# Patient Record
Sex: Male | Born: 1940
Health system: Southern US, Community
[De-identification: ages and names within clinical notes are randomized; demographics above are authoritative.]

## PROBLEM LIST (undated history)

## (undated) DIAGNOSIS — I10 Essential (primary) hypertension: Secondary | ICD-10-CM

## (undated) DIAGNOSIS — R011 Cardiac murmur, unspecified: Secondary | ICD-10-CM

## (undated) DIAGNOSIS — I2510 Atherosclerotic heart disease of native coronary artery without angina pectoris: Secondary | ICD-10-CM

## (undated) DIAGNOSIS — R7989 Other specified abnormal findings of blood chemistry: Secondary | ICD-10-CM

## (undated) DIAGNOSIS — I358 Other nonrheumatic aortic valve disorders: Secondary | ICD-10-CM

## (undated) DIAGNOSIS — F329 Major depressive disorder, single episode, unspecified: Secondary | ICD-10-CM

## (undated) DIAGNOSIS — E119 Type 2 diabetes mellitus without complications: Secondary | ICD-10-CM

## (undated) DIAGNOSIS — E785 Hyperlipidemia, unspecified: Secondary | ICD-10-CM

## (undated) DIAGNOSIS — R778 Other specified abnormalities of plasma proteins: Secondary | ICD-10-CM

## (undated) DIAGNOSIS — R7303 Prediabetes: Secondary | ICD-10-CM

## (undated) DIAGNOSIS — K219 Gastro-esophageal reflux disease without esophagitis: Secondary | ICD-10-CM

## (undated) DIAGNOSIS — F32A Depression, unspecified: Secondary | ICD-10-CM

## (undated) HISTORY — DX: Hyperlipidemia, unspecified: E78.5

## (undated) HISTORY — DX: Other nonrheumatic aortic valve disorders: I35.8

## (undated) HISTORY — PX: SPINE SURGERY: SHX786

## (undated) HISTORY — DX: Atherosclerotic heart disease of native coronary artery without angina pectoris: I25.10

## (undated) HISTORY — DX: Type 2 diabetes mellitus without complications: E11.9

## (undated) HISTORY — DX: Other specified abnormal findings of blood chemistry: R79.89

## (undated) HISTORY — DX: Essential (primary) hypertension: I10

## (undated) HISTORY — PX: KNEE ARTHROSCOPY: SUR90

## (undated) HISTORY — DX: Other specified abnormalities of plasma proteins: R77.8

## (undated) HISTORY — PX: CERVICAL SPINE SURGERY: SHX589

---
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
## 2000-01-15 ENCOUNTER — Ambulatory Visit (HOSPITAL_BASED_OUTPATIENT_CLINIC_OR_DEPARTMENT_OTHER): Admission: RE | Admit: 2000-01-15 | Discharge: 2000-01-15 | Payer: Self-pay | Admitting: Orthopedic Surgery

## 2002-03-23 ENCOUNTER — Ambulatory Visit (HOSPITAL_COMMUNITY): Admission: RE | Admit: 2002-03-23 | Discharge: 2002-03-23 | Payer: Self-pay | Admitting: General Surgery

## 2002-06-13 ENCOUNTER — Encounter: Payer: Self-pay | Admitting: Family Medicine

## 2002-06-13 ENCOUNTER — Ambulatory Visit (HOSPITAL_COMMUNITY): Admission: RE | Admit: 2002-06-13 | Discharge: 2002-06-13 | Payer: Self-pay | Admitting: Family Medicine

## 2002-06-29 ENCOUNTER — Ambulatory Visit (HOSPITAL_COMMUNITY): Admission: RE | Admit: 2002-06-29 | Discharge: 2002-06-29 | Payer: Self-pay | Admitting: Family Medicine

## 2005-01-22 ENCOUNTER — Ambulatory Visit (HOSPITAL_COMMUNITY): Admission: RE | Admit: 2005-01-22 | Discharge: 2005-01-22 | Payer: Self-pay | Admitting: Cardiology

## 2005-11-30 ENCOUNTER — Emergency Department (HOSPITAL_COMMUNITY): Admission: EM | Admit: 2005-11-30 | Discharge: 2005-11-30 | Payer: Self-pay | Admitting: Emergency Medicine

## 2007-01-12 ENCOUNTER — Ambulatory Visit (HOSPITAL_COMMUNITY): Admission: RE | Admit: 2007-01-12 | Discharge: 2007-01-12 | Payer: Self-pay | Admitting: Family Medicine

## 2009-01-10 ENCOUNTER — Ambulatory Visit (HOSPITAL_COMMUNITY): Admission: RE | Admit: 2009-01-10 | Discharge: 2009-01-10 | Payer: Self-pay | Admitting: Family Medicine

## 2009-08-21 ENCOUNTER — Ambulatory Visit (HOSPITAL_COMMUNITY): Admission: RE | Admit: 2009-08-21 | Discharge: 2009-08-21 | Payer: Self-pay | Admitting: Family Medicine

## 2010-12-05 NOTE — H&P (Signed)
   NAME:  Douglas Keller, Douglas Keller NO.:  [id number]   MEDICAL RECORD NO.:  [id number]                  PATIENT TYPE:   LOCATION:                                       FACILITY:   PHYSICIAN:  Dalia Heading, M.D.               DATE OF BIRTH:  1941-06-16   DATE OF ADMISSION:  03/23/2002  DATE OF DISCHARGE:                                HISTORY & PHYSICAL   CHIEF COMPLAINT:  Hematochezia.   HISTORY OF PRESENT ILLNESS:  The patient is a 69 year old black male who is  referred for a colonoscopy.  He was noted to be hemoccult positive on a  recent rectal examination.  He states he has a history of slight bleeding  from hemorrhoidal disease.  He denies any lightheadedness, weight loss,  fever, abdominal pain, constipation, diarrhea, or melena.  He had a  colonoscopy four years ago by another physician which was reportedly  unremarkable.  There is no immediate family history of colon carcinoma.   PAST MEDICAL HISTORY:  Hypertension.   PAST SURGICAL HISTORY:  Knee surgery in 2001.   CURRENT MEDICATIONS:  Norvasc, Prinivil, hydrochlorothiazide, baby aspirin,  Vitamin E, glucosamine.   ALLERGIES:  No known drug allergies.   REVIEW OF SYSTEMS:  Unremarkable.   PHYSICAL EXAMINATION:  GENERAL:  The patient is a well-developed, well-  nourished black male in no acute distress.  VITAL SIGNS:  He is afebrile and vital signs are stable.  LUNGS:  Clear to auscultation with equal breath sounds bilaterally.  HEART:  Reveals a regular rate and rhythm without S3, S4, or murmurs.  ABDOMEN:  Benign.  RECTAL:  Deferred to the procedure.   IMPRESSION:  Hematochezia.    PLAN:  The patient is scheduled for a colonoscopy on March 23, 2002.  The  risks and benefits of the procedure including bleeding and perforation were  fully explained to the patient, gaining informed consent.                                                Dalia Heading, M.D.    MAJ/MEDQ  D:   03/16/2002  T:  03/16/2002  Job:  [id number]   cc:   Madelin Rear. Sherwood Gambler, M.D.  [address]  [place]  [place] [address]  Fax: [phone]

## 2010-12-05 NOTE — Op Note (Signed)
[place]  Patient:    STEEN, BISIG                       MRN: [id number] Proc. Date: 01/15/00 Adm. Date:  [id number] Attending:  Colbert Ewing                           Operative Report  PREOPERATIVE DIAGNOSIS:  Degenerative, medial meniscus tear, left knee.  POSTOPERATIVE DIAGNOSES:  Degenerative, medial and lateral menisci tear, left knee with associated mild to moderate diffuse, degenerative changes.  PROCEDURES:  Left knee exam under anesthesia arthroscopy with partial medial and lateral meniscectomy.  Chondroplasty, medial and lateral compartment.  SURGEON:  Loreta Ave, M.D.  ASSISTANT:  Arlys John D. Petrarca, P.A.-C.  ANESTHESIA:  General.  ESTIMATED BLOOD LOSS:  Minimal.  SPECIMENS:  None.  CULTURES:  None.  COMPLICATIONS:  None.  DRESSING:  Self-compressive.  DESCRIPTION OF PROCEDURE:  The patient was brought to the operating room and after adequate anesthesia had been obtained, the tourniquet and leg holder were applied after the knee was examined.  Full motion.  Stable knee without significant crepitus or instability.  Leg holder and knee immobilizer applied. Prepped and draped in the usual sterile fashion.  Three portals created, one superolateral, one each medial and lateral parapatellar.  Inflow catheter introduced.  The knee was distended.  Arthroscope introduced.  Knee inspected in a serial manner with following findings.  The patellofemoral joint had some mild degenerative changed, nothing marked, and there was good tracking. Slight unevenness of articular cartilage debrided with a shaver.  The medial compartment had extensive degenerative tearing, posterior half of medial meniscus.  This was able to be saucerized out with baskets and then tapered in smoothly with a shaver, removing the posterior half of the meniscus, but tapering it in well and saving the anterior half.  There were grade II and  III changes on the medial femoral condyle.  Although focal, these were spread out over different areas on the condyle, so a lot of the weightbearing dome was involved.  Uneven flaps of cartilage debrided to a stable surface.  The plateau looked good.  The cruciate ligaments were inspected and were intact. The lateral compartment had degenerative tearing of the lateral meniscus in the central third, and this was saucerized out with a shaver to a nice, smooth surface.  Grade II and mild grade III changes on the plateau, which were debrided evenly with a shaver.  The lateral femoral condyle did not have significant degenerative changes.  Once this was completed, the entire knee was once again examined.  Loose, chondral fragments throughout the knee were all debrided.  Some hypertrophic synovial tissue was also debrided from the front of the knee.  No other significant findings were appreciated. Instruments and fluid were removed.  The portals and knee were injected with Marcaine.  The portals were closed with 4-0 nylon.  Sterile, compressive dressing applied.  Anesthesia reversed and brought to the recovery room. Tolerated the surgery well with no complications. DD:  01/15/00 TD:  01/17/00 Job: [id number]

## 2011-08-14 ENCOUNTER — Encounter (HOSPITAL_COMMUNITY): Payer: Self-pay | Admitting: *Deleted

## 2011-08-14 ENCOUNTER — Other Ambulatory Visit: Payer: Self-pay

## 2011-08-14 ENCOUNTER — Emergency Department (HOSPITAL_COMMUNITY)
Admission: EM | Admit: 2011-08-14 | Discharge: 2011-08-15 | Disposition: A | Discharge status: Home / Self Care | Payer: Medicare Other | Attending: Emergency Medicine | Admitting: Emergency Medicine

## 2011-08-14 ENCOUNTER — Emergency Department (HOSPITAL_COMMUNITY): Payer: Medicare Other

## 2011-08-14 DIAGNOSIS — I10 Essential (primary) hypertension: Secondary | ICD-10-CM | POA: Insufficient documentation

## 2011-08-14 DIAGNOSIS — E876 Hypokalemia: Secondary | ICD-10-CM

## 2011-08-14 DIAGNOSIS — R109 Unspecified abdominal pain: Secondary | ICD-10-CM | POA: Insufficient documentation

## 2011-08-14 DIAGNOSIS — M79609 Pain in unspecified limb: Secondary | ICD-10-CM | POA: Insufficient documentation

## 2011-08-14 DIAGNOSIS — R51 Headache: Secondary | ICD-10-CM | POA: Insufficient documentation

## 2011-08-14 DIAGNOSIS — R079 Chest pain, unspecified: Secondary | ICD-10-CM | POA: Insufficient documentation

## 2011-08-14 DIAGNOSIS — I4949 Other premature depolarization: Secondary | ICD-10-CM | POA: Insufficient documentation

## 2011-08-14 LAB — COMPREHENSIVE METABOLIC PANEL
ALT: 20 U/L (ref 0–53)
AST: 35 U/L (ref 0–37)
Albumin: 3.8 g/dL (ref 3.5–5.2)
Alkaline Phosphatase: 59 U/L (ref 39–117)
BUN: 18 mg/dL (ref 6–23)
CO2: 27 mEq/L (ref 19–32)
Calcium: 9 mg/dL (ref 8.4–10.5)
Chloride: 103 mEq/L (ref 96–112)
Creatinine, Ser: 1.04 mg/dL (ref 0.50–1.35)
GFR calc Af Amer: 83 mL/min — ABNORMAL LOW (ref >90)
GFR calc non Af Amer: 71 mL/min — ABNORMAL LOW (ref >90)
Glucose, Bld: 119 mg/dL — ABNORMAL HIGH (ref 70–99)
Potassium: 3.3 mEq/L — ABNORMAL LOW (ref 3.5–5.1)
Sodium: 139 mEq/L (ref 135–145)
Total Bilirubin: 0.4 mg/dL (ref 0.3–1.2)
Total Protein: 7.5 g/dL (ref 6.0–8.3)

## 2011-08-14 LAB — CBC
HCT: 41.4 % (ref 39.0–52.0)
Hemoglobin: 14.2 g/dL (ref 13.0–17.0)
MCH: 30.7 pg (ref 26.0–34.0)
MCHC: 34.3 g/dL (ref 30.0–36.0)
MCV: 89.4 fL (ref 78.0–100.0)
Platelets: 251 10*3/uL (ref 150–400)
RBC: 4.63 MIL/uL (ref 4.22–5.81)
RDW: 12.6 % (ref 11.5–15.5)
WBC: 7.8 10*3/uL (ref 4.0–10.5)

## 2011-08-14 LAB — DIFFERENTIAL
Basophils Absolute: 0 10*3/uL (ref 0.0–0.1)
Basophils Relative: 0 % (ref 0–1)
Eosinophils Absolute: 0.2 10*3/uL (ref 0.0–0.7)
Eosinophils Relative: 3 % (ref 0–5)
Lymphocytes Relative: 31 % (ref 12–46)
Lymphs Abs: 2.4 10*3/uL (ref 0.7–4.0)
Monocytes Absolute: 0.6 10*3/uL (ref 0.1–1.0)
Monocytes Relative: 8 % (ref 3–12)
Neutro Abs: 4.6 10*3/uL (ref 1.7–7.7)
Neutrophils Relative %: 58 % (ref 43–77)

## 2011-08-14 LAB — POCT I-STAT TROPONIN I
Troponin i, poc: 0.02 ng/mL (ref 0.00–0.08)
Troponin i, poc: 0.02 ng/mL (ref 0.00–0.08)

## 2011-08-14 LAB — LIPASE, BLOOD: Lipase: 60 U/L — ABNORMAL HIGH (ref 11–59)

## 2011-08-14 MED ORDER — POTASSIUM CHLORIDE ER 10 MEQ PO TBCR: 10.0000 meq | EXTENDED_RELEASE_TABLET | Freq: Two times a day (BID) | ORAL | Status: DC

## 2011-08-14 MED ORDER — POTASSIUM CHLORIDE CRYS ER 20 MEQ PO TBCR
40.0000 meq | EXTENDED_RELEASE_TABLET | Freq: Once | ORAL | Status: AC
  Administered 2011-08-14: 40 meq via ORAL
  Filled 2011-08-14: qty 2

## 2011-08-14 MED ORDER — NITROGLYCERIN 0.4 MG SL SUBL
0.4000 mg | SUBLINGUAL_TABLET | SUBLINGUAL | Status: DC | PRN
  Administered 2011-08-15: 0.4 mg via SUBLINGUAL
  Filled 2011-08-14: qty 25

## 2011-08-14 MED ORDER — SODIUM CHLORIDE 0.9 % IV SOLN
INTRAVENOUS | Status: DC
  Administered 2011-08-14: 21:00:00 via INTRAVENOUS

## 2011-08-14 NOTE — ED Provider Notes (Cosign Needed)
History     CSN: [id number]  Arrival date & time 08/14/11  1955   First MD Initiated Contact with Patient 08/14/11 2004      Chief Complaint  Patient presents with  . Arm Pain  . Abdominal Pain    (Consider location/radiation/quality/duration/timing/severity/associated sxs/prior treatment) HPI  Patient relates about 7:15 he was sitting doing nothing, and he started getting pain in his left lateral lower chest/upper abdomen that radiated down his left arm. He states there was a steady pressure pain and then a shocking jabbing pain. He felt lightheaded but denies shortness of breath, nausea, vomiting, or diaphoresis. He states he's never had it before. He states the pain lasted about 30 minutes. He states his pain at its worst was a 6 and it currently is a 0.   Patient states his father started having heart disease when he was in his 24s. He relates he started seeing a cardiologist when he was 51 mainly for blood pressure control. He relates he's never been to be evaluated for MI, or cardiac stent.  PCP Dr. Phillips Odor [facility] and vascular Dr. Moreen Fowler   Past Medical History  Diagnosis Date  . Hypertension     Past Surgical History  Procedure Date  . Knee arthroscopy     History reviewed. No pertinent family history.  Father patient had a MI while in his 41s.  History  Substance Use Topics  . Smoking status: Never Smoker   . Smokeless tobacco: Not on file  . Alcohol Use: No, quit   retired  Review of Systems  All other systems reviewed and are negative.    Allergies  Review of patient's allergies indicates no known allergies.  Home Medications   Current Outpatient Rx  Name Route Sig Dispense Refill  . AMLODIPINE BESYLATE 10 MG PO TABS Oral Take 10 mg by mouth daily.    . ASPIRIN EC 81 MG PO TBEC Oral Take 81 mg by mouth at bedtime.    . OMEGA-3 FATTY ACIDS 1000 MG PO CAPS Oral Take 1 g by mouth 2 (two) times daily.    Marland Kitchen GLUCOSAMINE PO  Oral Take 1 tablet by mouth at bedtime.    Marland Kitchen HYDROCHLOROTHIAZIDE 25 MG PO TABS Oral Take 25 mg by mouth daily.    Marland Kitchen LISINOPRIL 40 MG PO TABS Oral Take 40 mg by mouth daily.    . ADULT MULTIVITAMIN W/MINERALS CH Oral Take 1 tablet by mouth daily.    Marland Kitchen ROSUVASTATIN CALCIUM 20 MG PO TABS Oral Take 20 mg by mouth at bedtime.      BP 134/79  Pulse 69  Temp(Src) 98.1 F (36.7 C) (Oral)  Resp 18  Ht 5\' 7"  (1.702 m)  Wt 190 lb (86.183 kg)  BMI 29.76 kg/m2  SpO2 98%  Vital signs normal   Physical Exam  Nursing note and vitals reviewed. Constitutional: He is oriented to person, place, and time. He appears well-developed and well-nourished.  Non-toxic appearance. He does not appear ill. No distress.  HENT:  Head: Normocephalic and atraumatic.  Right Ear: External ear normal.  Left Ear: External ear normal.  Nose: Nose normal. No mucosal edema or rhinorrhea.  Mouth/Throat: Oropharynx is clear and moist and mucous membranes are normal. No dental abscesses or uvula swelling.  Eyes: Conjunctivae and EOM are normal. Pupils are equal, round, and reactive to light.  Neck: Normal range of motion and full passive range of motion without pain. Neck supple.  Cardiovascular: Normal rate, regular  rhythm and normal heart sounds.  Exam reveals no gallop and no friction rub.   No murmur heard. Pulmonary/Chest: Effort normal and breath sounds normal. No respiratory distress. He has no wheezes. He has no rhonchi. He has no rales. He exhibits no tenderness and no crepitus.  Abdominal: Soft. Normal appearance and bowel sounds are normal. He exhibits no distension. There is no tenderness. There is no rebound and no guarding.    Musculoskeletal: Normal range of motion. He exhibits no edema and no tenderness.       Moves all extremities well.   Neurological: He is alert and oriented to person, place, and time. He has normal strength. No cranial nerve deficit.  Skin: Skin is warm, dry and intact. No rash noted.  No erythema. No pallor.  Psychiatric: He has a normal mood and affect. His speech is normal and behavior is normal. His mood appears not anxious.    ED Course  Procedures (including critical care time)  Pt remains pain free during his ED visit. His second troponin is negative. Pt wants to go home.    Results for orders placed during the hospital encounter of 08/14/11  CBC      Component Value Range   WBC 7.8  4.0 - 10.5 (K/uL)   RBC 4.63  4.22 - 5.81 (MIL/uL)   Hemoglobin 14.2  13.0 - 17.0 (g/dL)   HCT 45.4  09.8 - 11.9 (%)   MCV 89.4  78.0 - 100.0 (fL)   MCH 30.7  26.0 - 34.0 (pg)   MCHC 34.3  30.0 - 36.0 (g/dL)   RDW 14.7  82.9 - 56.2 (%)   Platelets 251  150 - 400 (K/uL)  DIFFERENTIAL      Component Value Range   Neutrophils Relative 58  43 - 77 (%)   Neutro Abs 4.6  1.7 - 7.7 (K/uL)   Lymphocytes Relative 31  12 - 46 (%)   Lymphs Abs 2.4  0.7 - 4.0 (K/uL)   Monocytes Relative 8  3 - 12 (%)   Monocytes Absolute 0.6  0.1 - 1.0 (K/uL)   Eosinophils Relative 3  0 - 5 (%)   Eosinophils Absolute 0.2  0.0 - 0.7 (K/uL)   Basophils Relative 0  0 - 1 (%)   Basophils Absolute 0.0  0.0 - 0.1 (K/uL)  COMPREHENSIVE METABOLIC PANEL      Component Value Range   Sodium 139  135 - 145 (mEq/L)   Potassium 3.3 (*) 3.5 - 5.1 (mEq/L)   Chloride 103  96 - 112 (mEq/L)   CO2 27  19 - 32 (mEq/L)   Glucose, Bld 119 (*) 70 - 99 (mg/dL)   BUN 18  6 - 23 (mg/dL)   Creatinine, Ser 1.30  0.50 - 1.35 (mg/dL)   Calcium 9.0  8.4 - 86.5 (mg/dL)   Total Protein 7.5  6.0 - 8.3 (g/dL)   Albumin 3.8  3.5 - 5.2 (g/dL)   AST 35  0 - 37 (U/L)   ALT 20  0 - 53 (U/L)   Alkaline Phosphatase 59  39 - 117 (U/L)   Total Bilirubin 0.4  0.3 - 1.2 (mg/dL)   GFR calc non Af Amer 71 (*) >90 (mL/min)   GFR calc Af Amer 83 (*) >90 (mL/min)  LIPASE, BLOOD      Component Value Range   Lipase 60 (*) 11 - 59 (U/L)  POCT I-STAT TROPONIN I      Component Value Range  Troponin i, poc 0.02  0.00 - 0.08 (ng/mL)    Comment 3           POCT I-STAT TROPONIN I      Component Value Range   Troponin i, poc 0.02  0.00 - 0.08 (ng/mL)   Comment 3            Laboratory interpretation all normal except hypokalemia and minimally elevated lipase of uncertain significance   Dg Chest Port 1 View  08/14/2011  *RADIOLOGY REPORT*  Clinical Data: Chest and abdominal pain.  PORTABLE CHEST - 1 VIEW  Comparison: 01/22/2005  Findings: Heart size is stable and within normal limits allowing for portable technique.  Both lungs are clear.  No evidence of pleural effusion.  Normal hilar and mediastinal contours peri  IMPRESSION: Stable exam.  No active disease.  Original Report Authenticated By: Danae Orleans, M.D.    Date: 08/14/2011  Rate: 81  Rhythm: normal sinus rhythm  QRS Axis: normal  Intervals: normal  ST/T Wave abnormalities: normal  Conduction Disutrbances:none  Narrative Interpretation: PVC's  Old EKG Reviewed: none available   Diagnoses that have been ruled out:  None  Diagnoses that are still under consideration:  None  Final diagnoses:  Chest pain  Hypokalemia   New Prescriptions   POTASSIUM CHLORIDE (K-DUR) 10 MEQ TABLET    Take 1 tablet (10 mEq total) by mouth 2 (two) times daily.   Plan discharge  Devoria Albe, MD, FACEP      MDM          Ward Givens, MD 08/14/11 [phone]

## 2011-08-14 NOTE — ED Notes (Signed)
Patient A&O; skin w/d. Respirations even and unlabored; able to speak in complete sentences without difficulty.  IV established; NS at 19ml/hr infusing without difficulty.  Abdomen soft and nontender.  Patient moves all extremities well.  Equal grips.

## 2011-08-14 NOTE — ED Notes (Signed)
Left arm pain and upper abd pain x 45 minutes with dizziness, denies SOB, N/V or dizziness at this time, denies diaphoresis

## 2011-08-15 NOTE — ED Notes (Signed)
Patient discharged home in good condition.  Prescription given for Potassium; effects and use explained.  Patient verbalized understanding to take medications as directed and to f/u with his MD on Monday.  Patient ambulatory with steady gait.

## 2012-07-19 NOTE — H&P (Signed)
  NTS SOAP Note  Vital Signs:  Vitals as of: 07/19/2012: Systolic 154: Diastolic 94: Heart Rate 69: Temp 97.19F: Height 28ft 7in: Weight 207Lbs 0 Ounces: BMI 32  BMI : 32.42 kg/m2  Subjective: This 26 Years 76 Months old Male presents for a screening TCS.  Denies any gi complaints.  Has not had a TCS in the last ten years.  No family h/o colon cancer.   Review of Symptoms:  Constitutional:unremarkable   Head:unremarkable    Eyes:unremarkable   Nose/Mouth/Throat:unremarkable Cardiovascular:  unremarkable   Respiratory:unremarkable   Gastrointestinal:  unremarkable   Genitourinary:unremarkable       neck, joint, back pain Skin:unremarkable Hematolgic/Lymphatic:unremarkable     Allergic/Immunologic:unremarkable     Past Medical History:    Reviewed   Past Medical History  Surgical History: knee surgery Medical Problems:  High Blood pressure, High cholesterol Allergies: nkda Medications: NCTZ, prinivil, amlodipine, baby asa, glucosamine, crestor   Social History:Reviewed  Social History  Preferred Language: English Ethnicity: Not Hispanic / Latino Age: 71 Years 6 Months Marital Status:  M Alcohol:  No Recreational drug(s):  No   Smoking Status: Never smoker reviewed on 07/19/2012 Functional Status reviewed on mm/dd/yyyy ------------------------------------------------ Bathing: Normal Cooking: Normal Dressing: Normal Driving: Normal Eating: Normal Managing Meds: Normal Oral Care: Normal Shopping: Normal Toileting: Normal Transferring: Normal Walking: Normal Cognitive Status reviewed on mm/dd/yyyy ------------------------------------------------ Attention: Normal Decision Making: Normal Language: Normal Memory: Normal Motor: Normal Perception: Normal Problem Solving: Normal Visual and Spatial: Normal   Family History:  Reviewed   Family History      Not Available    Objective Information: General:   Well appearing, well nourished in no distress. Neck:  Supple without lymphadenopathy.  Heart:  RRR, no murmur Lungs:    CTA bilaterally, no wheezes, rhonchi, rales.  Breathing unlabored. Abdomen:Soft, NT/ND, no HSM, no masses.   deferred to procedure  Assessment:Need for screening TCS  Diagnosis &amp; Procedure:    Plan:Scheduled for TCS on 07/26/12.   Patient Education:Alternative treatments to surgery were discussed with patient (and family).  Risks and benefits  of procedure were fully explained to the patient (and family) who gave informed consent. Patient/family questions were addressed.  Follow-up:Pending Surgery                           Active Diagnosis and Procedures: V76.51 Screening for malignant neoplasms of colon   99202 - OFFICE OUTPATIENT NEW 20 MINUTES

## 2012-07-21 ENCOUNTER — Encounter (HOSPITAL_COMMUNITY): Payer: Self-pay | Admitting: Pharmacy Technician

## 2012-07-26 ENCOUNTER — Ambulatory Visit (HOSPITAL_COMMUNITY)
Admission: RE | Admit: 2012-07-26 | Discharge: 2012-07-26 | Disposition: A | Discharge status: Home / Self Care | Payer: Medicare Other | Source: Ambulatory Visit | Attending: General Surgery | Admitting: General Surgery

## 2012-07-26 ENCOUNTER — Encounter (HOSPITAL_COMMUNITY): Payer: Self-pay | Admitting: *Deleted

## 2012-07-26 ENCOUNTER — Encounter (HOSPITAL_COMMUNITY)
Admission: RE | Disposition: A | Discharge status: Home / Self Care | Payer: Self-pay | Source: Ambulatory Visit | Attending: General Surgery

## 2012-07-26 DIAGNOSIS — I10 Essential (primary) hypertension: Secondary | ICD-10-CM | POA: Insufficient documentation

## 2012-07-26 DIAGNOSIS — K5289 Other specified noninfective gastroenteritis and colitis: Secondary | ICD-10-CM | POA: Insufficient documentation

## 2012-07-26 DIAGNOSIS — Z1211 Encounter for screening for malignant neoplasm of colon: Secondary | ICD-10-CM | POA: Insufficient documentation

## 2012-07-26 HISTORY — PX: COLONOSCOPY: SHX5424

## 2012-07-26 LAB — CLOSTRIDIUM DIFFICILE BY PCR: Toxigenic C. Difficile by PCR: NEGATIVE

## 2012-07-26 SURGERY — COLONOSCOPY
Anesthesia: Moderate Sedation

## 2012-07-26 MED ORDER — MIDAZOLAM HCL 5 MG/5ML IJ SOLN
INTRAMUSCULAR | Status: DC | PRN
  Administered 2012-07-26: 4 mg via INTRAVENOUS

## 2012-07-26 MED ORDER — MEPERIDINE HCL 50 MG/ML IJ SOLN
INTRAMUSCULAR | Status: AC
  Filled 2012-07-26: qty 2

## 2012-07-26 MED ORDER — MIDAZOLAM HCL 5 MG/5ML IJ SOLN
INTRAMUSCULAR | Status: AC
  Filled 2012-07-26: qty 10

## 2012-07-26 MED ORDER — MEPERIDINE HCL 25 MG/ML IJ SOLN
INTRAMUSCULAR | Status: DC | PRN
  Administered 2012-07-26: 50 mg via INTRAVENOUS

## 2012-07-26 MED ORDER — STERILE WATER FOR IRRIGATION IR SOLN
Status: DC | PRN
  Administered 2012-07-26: 07:00:00

## 2012-07-26 MED ORDER — SODIUM CHLORIDE 0.45 % IV SOLN
INTRAVENOUS | Status: DC
  Administered 2012-07-26: 07:00:00 via INTRAVENOUS

## 2012-07-26 NOTE — Op Note (Signed)
[facility] [address] [place], [address]   COLONOSCOPY PROCEDURE REPORT  PATIENT: Douglas Keller, Douglas Keller  MR#: [id number] BIRTHDATE: October 31, 1940 , 71  yrs. old GENDER: Male ENDOSCOPIST: Franky Macho, MD REFERRED EX:[id number], John PROCEDURE DATE:  07/26/2012 PROCEDURE:   Colonoscopy, screening ASA CLASS:   Class II INDICATIONS:Average risk patient for colorectal cancer. MEDICATIONS: Versed 4 mg IV and Demerol 50 mg IV  DESCRIPTION OF PROCEDURE:   After the risks benefits and alternatives of the procedure were thoroughly explained, informed consent was obtained.  A digital rectal exam revealed no abnormalities of the rectum.   The Pentax Colonoscope [id number] endoscope was introduced through the anus and advanced to the cecum, which was identified by both the appendix and ileocecal valve. No adverse events experienced.   The quality of the prep was adequate, using MoviPrep  The instrument was then slowly withdrawn as the colon was fully examined.      COLON FINDINGS: Abnormal mucosa was found in the proximal ascending colon.  The mucosa was friable.  Retroflexed views revealed no abnormalities. The time to cecum=6 minutes 0 seconds.  Withdrawal time=6 minutes 0 seconds.  The scope was withdrawn and the procedure completed. COMPLICATIONS: There were no complications.  ENDOSCOPIC IMPRESSION: Abnormal mucosa was found in the proximal ascending colon This is nonspecific and may be related to prep.  Cultures and biopsy pending RECOMMENDATIONS: 1.  Await biopsy results 2.  Repeat Colonscopy in 10 years.   eSigned:  Franky Macho, MD 07/26/2012 7:58 AM   cc:

## 2012-07-26 NOTE — Interval H&P Note (Signed)
History and Physical Interval Note:  07/26/2012 7:33 AM  Douglas Keller  has presented today for surgery, with the diagnosis of screening for malignancy  The various methods of treatment have been discussed with the patient and family. After consideration of risks, benefits and other options for treatment, the patient has consented to  Procedure(s) (LRB) with comments: COLONOSCOPY (N/A) as a surgical intervention .  The patient's history has been reviewed, patient examined, no change in status, stable for surgery.  I have reviewed the patient's chart and labs.  Questions were answered to the patient's satisfaction.     [name]

## 2012-07-27 LAB — OVA AND PARASITE EXAMINATION: Ova and parasites: NONE SEEN

## 2012-07-27 LAB — FECAL LACTOFERRIN, QUANT: Fecal Lactoferrin: POSITIVE

## 2012-07-27 LAB — GIARDIA/CRYPTOSPORIDIUM SCREEN(EIA)
Cryptosporidium Screen (EIA): NEGATIVE
Giardia Screen - EIA: NEGATIVE

## 2012-07-28 ENCOUNTER — Encounter (HOSPITAL_COMMUNITY): Payer: Self-pay | Admitting: General Surgery

## 2012-07-30 LAB — STOOL CULTURE

## 2012-11-30 ENCOUNTER — Other Ambulatory Visit: Payer: Self-pay | Admitting: Cardiovascular Disease

## 2012-12-01 NOTE — Telephone Encounter (Signed)
#   30 given on refills.  Needs appt.

## 2012-12-30 ENCOUNTER — Other Ambulatory Visit: Payer: Self-pay | Admitting: *Deleted

## 2012-12-30 MED ORDER — LISINOPRIL 40 MG PO TABS: 40.0000 mg | ORAL_TABLET | Freq: Every day | ORAL | Status: DC

## 2012-12-30 MED ORDER — POTASSIUM CHLORIDE CRYS ER 10 MEQ PO TBCR: 10.0000 meq | EXTENDED_RELEASE_TABLET | Freq: Every day | ORAL | Status: DC

## 2012-12-30 MED ORDER — AMLODIPINE BESYLATE 10 MG PO TABS: 10.0000 mg | ORAL_TABLET | Freq: Every day | ORAL | Status: DC

## 2012-12-30 MED ORDER — HYDROCHLOROTHIAZIDE 25 MG PO TABS: 25.0000 mg | ORAL_TABLET | Freq: Every day | ORAL | Status: DC

## 2013-01-04 ENCOUNTER — Other Ambulatory Visit: Payer: Self-pay | Admitting: *Deleted

## 2013-02-13 ENCOUNTER — Ambulatory Visit (HOSPITAL_COMMUNITY)
Admission: RE | Admit: 2013-02-13 | Discharge: 2013-02-13 | Disposition: A | Discharge status: Home / Self Care | Payer: Medicare Other | Source: Ambulatory Visit | Attending: Anesthesiology | Admitting: Anesthesiology

## 2013-02-13 DIAGNOSIS — M5030 Other cervical disc degeneration, unspecified cervical region: Secondary | ICD-10-CM | POA: Insufficient documentation

## 2013-02-13 DIAGNOSIS — M542 Cervicalgia: Secondary | ICD-10-CM | POA: Insufficient documentation

## 2013-02-13 DIAGNOSIS — M47812 Spondylosis without myelopathy or radiculopathy, cervical region: Secondary | ICD-10-CM | POA: Insufficient documentation

## 2013-02-13 DIAGNOSIS — I10 Essential (primary) hypertension: Secondary | ICD-10-CM | POA: Insufficient documentation

## 2013-02-13 DIAGNOSIS — IMO0001 Reserved for inherently not codable concepts without codable children: Secondary | ICD-10-CM | POA: Insufficient documentation

## 2013-02-13 DIAGNOSIS — M4802 Spinal stenosis, cervical region: Secondary | ICD-10-CM | POA: Insufficient documentation

## 2013-02-13 HISTORY — DX: Other cervical disc degeneration, unspecified cervical region: M50.30

## 2013-02-13 HISTORY — DX: Spondylosis without myelopathy or radiculopathy, cervical region: M47.812

## 2013-02-13 NOTE — Evaluation (Signed)
Physical Therapy Evaluation  Patient Details  Name: Douglas Keller MRN: [id number] Date of Birth: 23-Dec-1940  Today's Date: 02/13/2013 Time: 4098-1191 PT Time Calculation (min): 40 min Charges:  Evaluation: 1  TE: [id number]             Visit#: 1 of 12  Re-eval: 03/15/13 Assessment Diagnosis: Neck Pain Next MD Visit: Dr. Laurian Brim - 03/07/13  Authorization: [employer]    Authorization Time Period:    Authorization Visit#: 1 of 10   Past Medical History:  Past Medical History  Diagnosis Date  . Hypertension   . Hypercholesteremia    Past Surgical History:  Past Surgical History  Procedure Laterality Date  . Knee arthroscopy    . Colonoscopy  07/26/2012    Procedure: COLONOSCOPY;  Surgeon: Dalia Heading, MD;  Location: [facility];  Service: Gastroenterology;  Laterality: N/A;    Subjective Symptoms/Limitations Pertinent History: Pt is referred to PT for neck pain which started back in 2010.  He reports that over the past two months it has become progressivly worse.  He reports that he has a hx of cervical stenosis which is compressing on his spinal cord.  He will have an MRI tomorrow to find out if it has progressed into his hand. He reports he has numbness to his entire RUE and is worse pain in the neck and shoudler.  He states he has a difficult time sleeping and is reduced to only 2-3 hour and is sleeping in his recliner.  He has pain when laying down flat and turning to his right side (for the past 2  weeks). Difficulty turning his head while driving., pain with golf.  He reports that prior to two months ago pain was a 3/10.  Limitations: House hold activities;Other (comment);Standing (laying down) How long can you stand comfortably?: 10 minutes.  Patient Stated Goals: I want to be able to play golf with less pain.  Pain Assessment Currently in Pain?: Yes Pain Score: 3  (Pain Range: 3-10/10) Pain Location: Neck Pain Orientation: Right Pain Radiating Towards: Rt  UE Pain Onset: More than a month ago Pain Frequency: Intermittent Pain Relieving Factors: Alleve Effect of Pain on Daily Activities: difficulty golfing, sleeping in a bed.   Balance Screening Balance Screen Has the patient fallen in the past 6 months: No Has the patient had a decrease in activity level because of a fear of falling? : Yes Is the patient reluctant to leave their home because of a fear of falling? : No  Prior Functioning  Prior Function Vocation: Retired Leisure: Hobbies-yes (Comment) Comments: Enjoys playing golf.   Sensation/Coordination/Flexibility/Functional Tests Coordination Gross Motor Movements are Fluid and Coordinated: No Coordination and Movement Description: impaired to scapular and posterior cervical musculature  Assessment RUE AROM (degrees) Right Shoulder Flexion: 160 Degrees (stiffness at end range) Right Shoulder ABduction: 150 Degrees (stifness at end range) Right Shoulder External Rotation: 80 Degrees (stiffness at end range) RUE Strength RUE Overall Strength Comments: 5/5 throughout UE Cervical AROM Cervical Flexion: WNL - pain Cervical Extension: 11.5 cm - pain Cervical - Right Side Bend: 19.5 cm Cervical - Left Side Bend: 18 cm (most painful) Cervical - Right Rotation: 17.5 cm  (pain) Cervical - Left Rotation: 18.5 cm Cervical Strength Overall Cervical Strength Comments: capital flexion 3/5 Cervical Flexion: 3/5 Cervical Extension: 4/5 Cervical - Right Side Bend: 5/5 Cervical - Left Side Bend: 5/5 Cervical - Right Rotation: 5/5 Cervical - Left Rotation: 5/5 Palpation Palpation: decreased anterior rib mobility Rib  1-5 Rt and Left, pain and tendenress to anterior ribs, bil upper trapezius and middle scalene, decreaed C7 PA mobilibity, decreased pain with distraction to neck  Mobility/Balance  Posture/Postural Control Posture/Postural Control: Postural limitations Postural Limitations: upper cross syndrome     Exercise/Treatments Seated Exercises Neck Retraction: 10 reps Cervical Rotation: Both;5 reps Lateral Flexion: Both;5 reps Postural Training: education and demonstration of proper technique Other Seated Exercise: scapula retraction 5 reps Supine Exercises Neck Retraction: 5 reps Other Supine Exercise: Capital flexion 3x3 sec holds    Physical Therapy Assessment and Plan PT Assessment and Plan Clinical Impression Statement: Pt is a 72 year old male referred to PT for neck pain with impairments listed below.  At this time pt is most limited by his significantly impaired posture causing adaptive shortening to cervical and shoulder musclature with decreased strength and with decreased mobility to anterior ribs, clavicle and lower cervical vertabrae likely causing increased pain.  Pt will benefit from skilled therapeutic intervention in order to improve on the following deficits: Pain;Decreased strength;Impaired perceived functional ability;Decreased range of motion;Increased muscle spasms;Increased fascial restricitons Rehab Potential: Good PT Frequency: Min 3X/week PT Duration: 4 weeks PT Treatment/Interventions: Therapeutic activities;Therapeutic exercise;Neuromuscular re-education;Patient/family education;Manual techniques;Modalities PT Plan: Cervical exercises to improve posture (UBE, X-V, W-backs) start in supine for exercises to improve postural strength.  Manual techniques and cervical traction for lower cervical spine.  manual with joint mobs to anterior Ribs 1-5 and UT regions.     Goals Home Exercise Program Pt/caregiver will Perform Home Exercise Program: Independently PT Goal: Perform Home Exercise Program - Progress: Goal set today PT Short Term Goals Time to Complete Short Term Goals: 2 weeks PT Short Term Goal 1: Pt will report pain less than 3/10  PT Short Term Goal 2: Pt will improve cervical and rt shoulder motion to WNL in order to comfortably turn neck which driving  his car.  PT Short Term Goal 3: Pt will be edcuated and verbalize approprirate posture and importance of proper posture to decrease risk of secondary impairments.  PT Long Term Goals Time to Complete Long Term Goals: 4 weeks PT Long Term Goal 1: Pt will improve NDI to less than 20% for improved QOL.  PT Long Term Goal 2: Pt will present with minimal forward head posture to decreae chance of secondary impairments. Long Term Goal 3: Pt will improve his cervical and shoulder coordination in order to swing a golf club with pain less than 3/10 to continue with golf activities.   Problem List Patient Active Problem List   Diagnosis Date Noted  . Cervical spondylosis without myelopathy 02/13/2013  . Degeneration of cervical intervertebral disc 02/13/2013  . Spinal stenosis in cervical region 02/13/2013    PT - End of Session Activity Tolerance: Patient tolerated treatment well General Behavior During Therapy: [facility] for tasks assessed/performed PT Plan of Care PT Home Exercise Plan: given PT Patient Instructions: importance of posture and exercise, answered questions Consulted and Agree with Plan of Care: Patient  GP Functional Assessment Tool Used: clinical observation (pt did not fill out NDI at eval, will bring back) Functional Limitation: Changing and maintaining body position Changing and Maintaining Body Position Current Status (Z6109): At least 40 percent but less than 60 percent impaired, limited or restricted Changing and Maintaining Body Position Goal Status (U0454): At least 1 percent but less than 20 percent impaired, limited or restricted  Johnpaul Gillentine, MPT, ATC 02/13/2013, 4:33 PM  Physician Documentation Your signature is required to indicate approval of  the treatment plan as stated above.  Please sign and either send electronically or make a copy of this report for your files and return this physician signed original.   Please mark one 1.__approve of plan  2. ___approve of  plan with the following conditions.   ______________________________                                                          _____________________ Physician Signature                                                                                                             Date

## 2013-02-20 ENCOUNTER — Ambulatory Visit (HOSPITAL_COMMUNITY)
Admission: RE | Admit: 2013-02-20 | Discharge: 2013-02-20 | Disposition: A | Discharge status: Home / Self Care | Payer: Medicare Other | Source: Ambulatory Visit | Attending: Anesthesiology | Admitting: Anesthesiology

## 2013-02-20 DIAGNOSIS — M542 Cervicalgia: Secondary | ICD-10-CM | POA: Insufficient documentation

## 2013-02-20 DIAGNOSIS — M47812 Spondylosis without myelopathy or radiculopathy, cervical region: Secondary | ICD-10-CM

## 2013-02-20 DIAGNOSIS — IMO0001 Reserved for inherently not codable concepts without codable children: Secondary | ICD-10-CM | POA: Insufficient documentation

## 2013-02-20 DIAGNOSIS — I10 Essential (primary) hypertension: Secondary | ICD-10-CM | POA: Insufficient documentation

## 2013-02-20 DIAGNOSIS — M4802 Spinal stenosis, cervical region: Secondary | ICD-10-CM

## 2013-02-20 DIAGNOSIS — M5030 Other cervical disc degeneration, unspecified cervical region: Secondary | ICD-10-CM

## 2013-02-20 NOTE — Progress Notes (Signed)
Physical Therapy Treatment Patient Details  Name: Douglas Keller MRN: [id number] Date of Birth: 11-Feb-1941  Today's Date: 02/20/2013 Time: 4098-1191 PT Time Calculation (min): 38 min Charges: Manual: [id number] TE: [id number] Visit#: 2 of 12  Re-eval: 03/15/13    Authorization: [employer]  Authorization Time Period:    Authorization Visit#: 2 of 10   Subjective: Symptoms/Limitations Symptoms: "I think the therapy is helping a lot.  Pain Assessment Pain Score: 4  Pain Location: Neck  Precautions/Restrictions     Exercise/Treatments Stretches Upper Trapezius Stretch: 3 reps;30 seconds Levator Stretch: 3 reps;30 seconds Seated Exercises Neck Retraction: 10 reps X to V: 5 reps Shoulder Rolls: Backwards;20 reps Supine Exercises Neck Retraction: 10 reps;5 secs Other Supine Exercise: Capital flexion 5x5 sec holds 2 sets Prone Exercises Other Prone Exercise: Prone On Elbow: rotation x5, flexion/extension x5, serratus anterior x10  Manual Therapy Manual Therapy: Joint mobilization Joint Mobilization: Grade I-III C-3-8 spinous process and rt transverse process and anterior Ribs 1-5 Myofascial Release: Rt scalene and upper trapezieus to decreased fascial restrictions  Physical Therapy Assessment and Plan PT Assessment and Plan Clinical Impression Statement: Added prone and seated therex to continue to improve cervical and scapular strength.  Stretching at end of session to decrease pain and improvie ROM.  Pt will benefit from skilled therapeutic intervention in order to improve on the following deficits: Pain;Decreased strength;Impaired perceived functional ability;Decreased range of motion;Increased muscle spasms;Increased fascial restricitons Rehab Potential: Good PT Frequency: Min 3X/week PT Duration: 4 weeks PT Treatment/Interventions: Therapeutic activities;Therapeutic exercise;Neuromuscular re-education;Patient/family education;Manual techniques;Modalities PT Plan:  Manual techniques to decrease Rt UT, scapular and anterior pain.  Add theraband and W-backs    Goals    Problem List Patient Active Problem List   Diagnosis Date Noted  . Cervical spondylosis without myelopathy 02/13/2013  . Degeneration of cervical intervertebral disc 02/13/2013  . Spinal stenosis in cervical region 02/13/2013    PT - End of Session Activity Tolerance: Patient tolerated treatment well General Behavior During Therapy: [facility] for tasks assessed/performed PT Plan of Care PT Home Exercise Plan: updated PT Patient Instructions: importance of posture and exercise, answered questions Consulted and Agree with Plan of Care: Patient  GP Functional Assessment Tool Used: clinical observation (pt did not fill out NDI at eval, will bring back) Changing and Maintaining Body Position Current Status (Y7829): At least 40 percent but less than 60 percent impaired, limited or restricted  Douglas Keller, MPT, ATC 02/20/2013, 9:27 AM

## 2013-02-22 ENCOUNTER — Ambulatory Visit (HOSPITAL_COMMUNITY)
Admission: RE | Admit: 2013-02-22 | Discharge: 2013-02-22 | Disposition: A | Discharge status: Home / Self Care | Payer: Medicare Other | Source: Ambulatory Visit | Attending: Anesthesiology | Admitting: Anesthesiology

## 2013-02-22 NOTE — Progress Notes (Signed)
Physical Therapy Treatment Patient Details  Name: Douglas Keller MRN: [id number] Date of Birth: 1941-04-28  Today's Date: 02/22/2013 Time: 4098-1191 PT Time Calculation (min): 40 min  Visit#: 3 of 12  Re-eval: 03/15/13 Authorization: [employer]  Authorization Visit#: 3 of 10  Charges: therex [phone] (30'), manual [phone] (9')  Subjective: Symptoms/Limitations Symptoms: Pt states he is getting much better.  Not even taking pain meds; 3/10 today, mostly Rt cervical. Pain Assessment Currently in Pain?: Yes Pain Score: 3  Pain Location: Neck Pain Orientation: Right   Exercise/Treatments Machines for Strengthening UBE (Upper Arm Bike): 4' backward Theraband Exercises Scapula Retraction: 10 reps;Green Shoulder Extension: 10 reps;Green Rows: 10 reps;Green Seated Exercises Neck Retraction: 10 reps X to V: 10 reps W Back: 10 reps Shoulder Rolls: Backwards;20 reps Prone Exercises Shoulder Extension: 10 reps Upper Extremity Flexion with Stabilization: 5 reps Other Prone Exercise: Prone On Elbow: rotation x5, flexion/extension x5, serratus anterior x10  Manual Therapy Manual Therapy: Other (comment) Other Manual Therapy: prone STM / MFR to Rt upper trap/scalene to decrease spasm, fascial restrictions and pain  Physical Therapy Assessment and Plan PT Assessment and Plan Clinical Impression Statement: Progressed prone stab exercises as well as adding postural therex with theraband.  Pt requires vc's for form and relaxation of upper trap muscles with activity.  Spasm/tightness palpated in Rt upper trap resolved 100% with manual techniques.  No pain voiced at end of session. PT Plan: Progress therex and continue Manual techniques to decrease Rt UT, scapular and anterior pain.       Problem List Patient Active Problem List   Diagnosis Date Noted  . Cervical spondylosis without myelopathy 02/13/2013  . Degeneration of cervical intervertebral disc 02/13/2013  . Spinal  stenosis in cervical region 02/13/2013    PT - End of Session Activity Tolerance: Patient tolerated treatment well General Behavior During Therapy: [facility] for tasks assessed/performed   Lurena Nida, PTA/CLT 02/22/2013, 9:28 AM

## 2013-02-24 ENCOUNTER — Ambulatory Visit (HOSPITAL_COMMUNITY)
Admission: RE | Admit: 2013-02-24 | Discharge: 2013-02-24 | Disposition: A | Discharge status: Home / Self Care | Payer: Medicare Other | Source: Ambulatory Visit

## 2013-02-24 DIAGNOSIS — M4802 Spinal stenosis, cervical region: Secondary | ICD-10-CM

## 2013-02-24 DIAGNOSIS — M5030 Other cervical disc degeneration, unspecified cervical region: Secondary | ICD-10-CM

## 2013-02-24 DIAGNOSIS — M47812 Spondylosis without myelopathy or radiculopathy, cervical region: Secondary | ICD-10-CM

## 2013-02-24 NOTE — Progress Notes (Signed)
Physical Therapy Treatment Patient Details  Name: Douglas Keller MRN: [id number] Date of Birth: 1940-10-19  Today's Date: 02/24/2013 Time: 0802-0843 PT Time Calculation (min): 41 min Charge: TE [id number], Manual [id number]  Visit#: 4 of 12  Re-eval: 03/15/13 Assessment Diagnosis: Neck Pain Next MD Visit: Dr. Laurian Brim - 03/07/13  Authorization: [employer]  Authorization Time Period:    Authorization Visit#: 4 of 10   Subjective: Symptoms/Limitations Symptoms: Pt stated neck is feeling good, able to sleep all night long.   Pain Assessment Currently in Pain?: No/denies  Objective:   Exercise/Treatments Machines for Strengthening UBE (Upper Arm Bike): 4' backward Theraband Exercises Scapula Retraction: 10 reps;Green Shoulder Extension: 10 reps;Green Rows: 10 reps;Green Other Theraband Exercises: Golf swing  with green tband 15x Standing Exercises Other Standing Exercises: body blade golf swing 15x  Seated Exercises Neck Retraction: 10 reps X to V: 10 reps W Back: 10 reps Prone Exercises Neck Retraction: 10 reps;Limitations Neck Retraction Limitations: chin tuck and head lift Shoulder Extension: 10 reps Rows: 10 reps  Manual Therapy Other Manual Therapy: Prone MFR f/b STM to Rt upper trap/scalene to decrease fascial restrictions and spasms  Physical Therapy Assessment and Plan PT Assessment and Plan Clinical Impression Statement: Added golf swing exercises to improve form and technique for functional strengthening, pt able to complete with no reports of increased pain with new activities.  Pt does require vcing to reduce forward head and relax his upper traps with standing and seated exercises.  Manual technqiues complete to decrease fascial restrictions to Rt scalenes and UT region. PT Plan: Progress therex and continue Manual techniques to decrease Rt UT, scapular and anterior pain.      Goals    Problem List Patient Active Problem List   Diagnosis Date  Noted  . Cervical spondylosis without myelopathy 02/13/2013  . Degeneration of cervical intervertebral disc 02/13/2013  . Spinal stenosis in cervical region 02/13/2013    PT - End of Session Activity Tolerance: Patient tolerated treatment well General Behavior During Therapy: [facility] for tasks assessed/performed  GP    Juel Burrow 02/24/2013, 8:45 AM

## 2013-02-27 ENCOUNTER — Ambulatory Visit (HOSPITAL_COMMUNITY)
Admission: RE | Admit: 2013-02-27 | Discharge: 2013-02-27 | Disposition: A | Discharge status: Home / Self Care | Payer: Medicare Other | Source: Ambulatory Visit | Attending: Anesthesiology | Admitting: Anesthesiology

## 2013-02-27 NOTE — Progress Notes (Signed)
Physical Therapy Treatment Patient Details  Name: Douglas Keller MRN: [id number] Date of Birth: 05/16/1941  Today's Date: 02/27/2013 Time: 0801-0835 PT Time Calculation (min): 34 min Visit#: 5 of 12  Re-eval: 03/15/13 Authorization: [employer]  Authorization Visit#: 5 of 10  Charges:  therex [phone] (23')  ,manual  [phone] (8')   Subjective: Symptoms/Limitations Symptoms: Pt states he was doing well until he hung ceiling tiles at the church and it aggrevated his neck.  Currently with 2/10 pain. Pain Assessment Currently in Pain?: Yes Pain Score: 2  Pain Location: Neck Pain Orientation: Right   Exercise/Treatments Machines for Strengthening UBE (Upper Arm Bike): 4' backward Theraband Exercises Scapula Retraction: 15 reps;Green Shoulder Extension: 15 reps;Green Rows: 15 reps;Green Seated Exercises Neck Retraction: 10 reps X to V: 10 reps W Back: 10 reps Prone Exercises Neck Retraction: 10 reps;Limitations Neck Retraction Limitations: chin tuck and head lift Shoulder Extension: 10 reps Rows: 10 reps Upper Extremity Flexion with Stabilization: 10 reps;Limitations UE Flexion with Stabilization Limitations: alternating  Manual Therapy Manual Therapy: Other (comment) Other Manual Therapy: Prone MFR f/b STM to Rt upper trap/scalene to decrease fascial restrictions and spasms  Physical Therapy Assessment and Plan PT Assessment and Plan PT Plan: Progress therex and continue Manual techniques to decrease Rt UT, scapular and anterior pain.      Problem List Patient Active Problem List   Diagnosis Date Noted  . Cervical spondylosis without myelopathy 02/13/2013  . Degeneration of cervical intervertebral disc 02/13/2013  . Spinal stenosis in cervical region 02/13/2013    PT - End of Session Activity Tolerance: Patient tolerated treatment well General Behavior During Therapy: [facility] for tasks assessed/performed   Lurena Nida, PTA/CLT 02/27/2013, 8:34 AM

## 2013-03-01 ENCOUNTER — Ambulatory Visit (HOSPITAL_COMMUNITY)
Admission: RE | Admit: 2013-03-01 | Discharge: 2013-03-01 | Disposition: A | Discharge status: Home / Self Care | Payer: Medicare Other | Source: Ambulatory Visit | Attending: Anesthesiology | Admitting: Anesthesiology

## 2013-03-01 NOTE — Progress Notes (Signed)
Physical Therapy Treatment Patient Details  Name: Douglas Keller MRN: [id number] Date of Birth: 1941/03/20  Today's Date: 03/01/2013 Time: 4098-1191 PT Time Calculation (min): 31 min  Visit#: 6 of 12  Re-eval: 03/15/13 Authorization: [employer]  Authorization Visit#: 6 of 10  Charges:  therex [phone] (14'), manual [phone] (15')  Subjective: Symptoms/Limitations Symptoms: Pt states his pain went up to 7/10 last night; no known cause for increase in pain. Pain Assessment Currently in Pain?: Yes Pain Score: 4    Exercise/Treatments Machines for Strengthening UBE (Upper Arm Bike): 6' backward Theraband Exercises Scapula Retraction: 15 reps;Green Shoulder Extension: 15 reps;Green Rows: 15 reps;Green Seated Exercises Neck Retraction: 10 reps X to V: 10 reps W Back: 10 reps   Manual Therapy Manual Therapy: Other (comment) Other Manual Therapy: supine suboccipital release and manual traction; STM to upper traps [phone] (15')  Physical Therapy Assessment and Plan PT Assessment and Plan Clinical Impression Statement: focused on pain Control today;  Began subocciptial release and manual traction with good results.  Pt reported no pain at end of session. No spasms/tightness in bilateral upper traps at end of session. PT Plan: Progress therex and continue Manual techniques to decrease Rt UT, scapular and anterior pain.  Assess pain next visit and continue traction, progressing to mechanicial if helping to decrease symptoms.     Problem List Patient Active Problem List   Diagnosis Date Noted  . Cervical spondylosis without myelopathy 02/13/2013  . Degeneration of cervical intervertebral disc 02/13/2013  . Spinal stenosis in cervical region 02/13/2013    PT - End of Session Activity Tolerance: Patient tolerated treatment well General Behavior During Therapy: [facility] for tasks assessed/performed   Lurena Nida, PTA/CLT 03/01/2013, 9:20 AM

## 2013-03-03 ENCOUNTER — Ambulatory Visit (HOSPITAL_COMMUNITY)
Admission: RE | Admit: 2013-03-03 | Discharge: 2013-03-03 | Disposition: A | Discharge status: Home / Self Care | Payer: Medicare Other | Source: Ambulatory Visit | Attending: Anesthesiology | Admitting: Anesthesiology

## 2013-03-03 DIAGNOSIS — M4802 Spinal stenosis, cervical region: Secondary | ICD-10-CM

## 2013-03-03 DIAGNOSIS — M47812 Spondylosis without myelopathy or radiculopathy, cervical region: Secondary | ICD-10-CM

## 2013-03-03 DIAGNOSIS — M5030 Other cervical disc degeneration, unspecified cervical region: Secondary | ICD-10-CM

## 2013-03-03 NOTE — Progress Notes (Addendum)
Physical Therapy Treatment  Patient Details  Name: Douglas Keller MRN: [id number] Date of Birth: 1940-08-16  Today's Date: 03/03/2013 Time: 0802-0853 PT Time Calculation (min): 51 min Charge: MMT/ ROM x 1 TE [id number], Manual [id number]              Visit#: 8 of 12  Re-eval: 03/15/13 Assessment Diagnosis: Neck Pain Next MD Visit: Dr. Laurian Brim - 03/07/13  Authorization: [employer]    Authorization Time Period:    Authorization Visit#: 8 of 10   Subjective Symptoms/Limitations Symptoms: Pt stated he liked the manual traction last session, reports pain increases at night.  Pain scale 3/10 posterior neck. Pain Assessment Currently in Pain?: Yes Pain Score: 3  Pain Location: Neck Pain Orientation: Posterior  Sensation/Coordination/Flexibility/Functional Tests Functional Tests Functional Tests: NDI: 8%  Assessment RUE AROM (degrees) Right Shoulder Flexion: 168 Degrees (was 160 degrees) Right Shoulder ABduction: 150 Degrees (was 150 degrees) Right Shoulder External Rotation: 85 Degrees (was 80 degrees) Cervical AROM Cervical Flexion: WNL pain free (was WNL w/pain) Cervical Extension: 10.5 (was 11.5) Cervical - Right Side Bend: 14.5 (was 19.5 cm) Cervical - Left Side Bend: 15 cm (was 18 cm Cervical - Right Rotation: 13.5 was 17.5 Cervical - Left Rotation: 13.5 cm (18.5 cm) Cervical Strength Overall Cervical Strength Comments:  (capital flexion 3+/5 was 3/5) Cervical Flexion: 3+/5 (was 3/5) Cervical Extension: 4/5 (was 4/5) Cervical - Right Side Bend: 5/5 (was 5/5) Cervical - Left Side Bend: 5/5 (was 5/5) Cervical - Right Rotation: 5/5 (was 5/5) Cervical - Left Rotation: 5/5 (was 5/5)  Exercise/Treatments Mobility/Balance  Posture/Postural Control Posture/Postural Control: Postural limitations Postural Limitations: upper cross syndrome    Machines for Strengthening UBE (Upper Arm Bike): 6' backward Theraband Exercises Scapula Retraction: 15  reps;Green Shoulder Extension: 15 reps;Green Rows: 15 reps;Green Seated Exercises Neck Retraction: 10 reps X to V: 15 reps W Back: 15 reps  Manual Therapy Manual Therapy: Other (comment) Other Manual Therapy: supine suboccipitial relase and manual traction (3 reps); STM to rt scalenes and upper trapezius [id number]  Physical Therapy Assessment and Plan PT Assessment and Plan Clinical Impression Statement: Re-eval complete prior MD apt. Douglas Keller has had 8 OPPT sessions over 3 weeks with the following findings: Pt progressing well towards all STG and LTGs andt has met 1/3 LTG. Pt was educated on benefits of completeing HEP and encouraged to complete more frequently. Pt reported pain intensity increases throughout the day,stated pain ranges from 0-7/10. Pt's cervical and Rt shoulder ROM and strength are progressing well in all directions. Pt has been educated on appropriate posture with teach back method and able to verbalize though does continue to require cueing to reduce forward head and forward rolled shoulders. improved QOL noted with neck disabilty index at 8%. This session focus on improving posture and manual techniques to reduce fascial restriction to Rt scapular and upper trapezius region. PT Plan: Recommend continuing OPPT to reach unmet goals.  Progress therex and continue Manual techniques to decrease Rt UT, scapular and anterior pain.  Assess pain next visit and continue traction, progressing to mechanicial if helping to decrease symptoms.    Goals    Problem List Patient Active Problem List   Diagnosis Date Noted  . Cervical spondylosis without myelopathy 02/13/2013  . Degeneration of cervical intervertebral disc 02/13/2013  . Spinal stenosis in cervical region 02/13/2013    PT - End of Session Activity Tolerance: Patient tolerated treatment well General Behavior During Therapy: [facility] for tasks assessed/performed  GP Functional Assessment Tool  Used: clinical observation  and NDI Functional Limitation: Changing and maintaining body position Changing and Maintaining Body Position Current Status (Z6109): At least 20 percent but less than 40 percent impaired, limited or restricted Changing and Maintaining Body Position Goal Status (U0454): At least 1 percent but less than 20 percent impaired, limited or restricted  Douglas Keller 03/03/2013, 8:09 PM

## 2013-03-06 ENCOUNTER — Ambulatory Visit (HOSPITAL_COMMUNITY)
Admission: RE | Admit: 2013-03-06 | Discharge: 2013-03-06 | Disposition: A | Discharge status: Home / Self Care | Payer: Medicare Other | Source: Ambulatory Visit | Attending: Physical Therapy | Admitting: Physical Therapy

## 2013-03-06 DIAGNOSIS — M5030 Other cervical disc degeneration, unspecified cervical region: Secondary | ICD-10-CM

## 2013-03-06 DIAGNOSIS — M4802 Spinal stenosis, cervical region: Secondary | ICD-10-CM

## 2013-03-06 DIAGNOSIS — M47812 Spondylosis without myelopathy or radiculopathy, cervical region: Secondary | ICD-10-CM

## 2013-03-06 NOTE — Evaluation (Signed)
Physical Therapy Re-Evaluation/treatment  Patient Details  Name: Douglas Keller MRN: [id number] Date of Birth: 06-21-1941  Today's Date: 03/06/2013 Time: 7829-5621 PT Time Calculation (min): 41 min              Visit#: 9 of 12  Re-eval: 03/15/13 Assessment Diagnosis: Neck Pain Next MD Visit: Dr. Laurian Brim - 03/07/13  Authorization: [employer]    Authorization Time Period:    Authorization Visit#: 9 of 10   Subjective Symptoms/Limitations Symptoms: Pt reports that he continues to have most of his pain during the night time.  He is doing better during the day.  States continued pain with golf activities. Reports some pain to his Rt and Lt shoulder Patient Stated Goals: I want to be able to play golf with less pain.  Pain Assessment Currently in Pain?: Yes Pain Score: 3  Pain Location: Neck  Sensation/Coordination/Flexibility/Functional Tests Functional Tests Functional Tests: NDI: 8%  Assessment RUE AROM (degrees) Right Shoulder Flexion: 168 Degrees (was 160 degrees) Right Shoulder ABduction: 150 Degrees (was 150 degrees) Right Shoulder External Rotation: 85 Degrees (was 80 degrees) LUE AROM (degrees) Left Shoulder Flexion: 153 Degrees Left Shoulder ABduction: 160 Degrees Left Shoulder External Rotation: 72 Degrees Left Elbow Flexion: 112 Left Elbow Extension: -9 Cervical AROM Cervical Flexion: WNL pain free (was WNL w/pain) Cervical Extension: 10.5 (was 11.5) Cervical - Right Side Bend: 14.5 (was 19.5 cm) Cervical - Left Side Bend: 15 cm (was 18 cm Cervical - Right Rotation: 13.5 was 17.5 Cervical - Left Rotation: 13.5 cm (18.5 cm) Cervical Strength Overall Cervical Strength Comments:  (capital flexion 3+/5 was 3/5) Cervical Flexion: 3+/5 (was 3/5) Cervical Extension: 4/5 (was 4/5) Cervical - Right Side Bend: 5/5 (was 5/5) Cervical - Left Side Bend: 5/5 (was 5/5) Cervical - Right Rotation: 5/5 (was 5/5) Cervical - Left Rotation: 5/5 (was  5/5) Palpation Palpation: decreased anterior rib mobility Rib 1-5 Rt and Left, pain and tendenress to anterior ribs, bil upper trapezius and middle scalene, decreaed C7 PA mobilibity, decreased pain with distraction to neck  Mobility/Balance  Posture/Postural Control Posture/Postural Control: Postural limitations Postural Limitations: improved cervical posture, continued rounded shoulders   Exercise/Treatments Supine External Rotation: AAROM;Both;5 reps;Weights;Limitations External Rotation Weight (lbs): 2# dowel External Rotation Limitations: 10 sec holds Flexion: AAROM;Both;5 reps;Weights;Limitations Shoulder Flexion Weight (lbs): 2# dowel Flexion Limitations: 10 sec holds Other Supine Exercises: Corner strech: 3x15 sec holds ROM / Strengthening / Isometric Strengthening Other ROM/Strengthening Exercises: Corner Presses 5x10 sec holds    Physical Therapy Assessment and Plan PT Assessment and Plan Clinical Impression Statement: Re-eval complete prior MD apt. Douglas Keller has had 9 OPPT sessions over 3 weeks with the following findings: . Pt was educated on benefits of completeing HEP and encouraged to complete more frequently. Pt reported pain intensity increases throughout the day,stated pain ranges from 0-7/10 w/worst pain at night. Pt's cervical and Rt shoulder ROM and strength are progressing well in all directions, limitations today found to Lt shoudler and elbow which may be contributing to impaired posture and mechancs. Pt has been educated on appropriate posture with teach back method and able to verbalize though does continue to require cueing to reduce forward head and forward rolled shoulders. iContinues to have significant limitaitons with pain and tenderness to 1st-2nd ribs with decreased clavicular mobility. mproved QOL noted with neck disabilty index at 8%. Added activities today to improve muscle length and flexiobilty to chest PT Frequency: Min 3X/week PT Duration: 4  weeks PT Treatment/Interventions: Functional mobility training;Therapeutic activities;Therapeutic exercise  PT Plan: Specific functional movement assessment, continue with flexibility to UE and decrease rounded shoulders.  manual techniques to anterior ribs 1-5 and clavicular mobility.     Goals Home Exercise Program Pt/caregiver will Perform Home Exercise Program: Independently PT Goal: Perform Home Exercise Program - Progress: Partly met PT Short Term Goals Time to Complete Short Term Goals: 2 weeks PT Short Term Goal 1: Pt will report pain less than 3/10  PT Short Term Goal 1 - Progress: Progressing toward goal PT Short Term Goal 2: Pt will improve cervical and rt shoulder motion to WNL in order to comfortably turn neck which driving his car.  (cervical WNL, shoulder progressing) PT Short Term Goal 2 - Progress: Partly met PT Short Term Goal 3: Pt will be edcuated and verbalize approprirate posture and importance of proper posture to decrease risk of secondary impairments.  PT Short Term Goal 3 - Progress: Met PT Long Term Goals Time to Complete Long Term Goals:  (6 weeks) PT Long Term Goal 1: Pt will improve NDI to less than 20% for improved QOL.  PT Long Term Goal 1 - Progress: Met PT Long Term Goal 2: Pt will present with minimal forward head posture to decreae chance of secondary impairments. PT Long Term Goal 2 - Progress: Progressing toward goal Long Term Goal 3: Pt will improve his cervical and shoulder coordination in order to swing a golf club with pain less than 3/10 to continue with golf activities.  Long Term Goal 3 Progress: Progressing toward goal  Problem List Patient Active Problem List   Diagnosis Date Noted  . Cervical spondylosis without myelopathy 02/13/2013  . Degeneration of cervical intervertebral disc 02/13/2013  . Spinal stenosis in cervical region 02/13/2013    PT - End of Session Activity Tolerance: Patient tolerated treatment well General Behavior  During Therapy: [facility] for tasks assessed/performed PT Plan of Care PT Home Exercise Plan: updated  PT Patient Instructions: teach back for posture and HEP Consulted and Agree with Plan of Care: Patient  GP Functional Assessment Tool Used: clinical observation and NDI  Lizanne Erker, MPT, ATC 03/06/2013, 3:02 PM  Physician Documentation Your signature is required to indicate approval of the treatment plan as stated above.  Please sign and either send electronically or make a copy of this report for your files and return this physician signed original.   Please mark one 1.__approve of plan  2. ___approve of plan with the following conditions.   ______________________________                                                          _____________________ Physician Signature                                                                                                             Date

## 2013-03-08 ENCOUNTER — Ambulatory Visit (HOSPITAL_COMMUNITY)
Admission: RE | Admit: 2013-03-08 | Discharge: 2013-03-08 | Disposition: A | Discharge status: Home / Self Care | Payer: Medicare Other | Source: Ambulatory Visit | Attending: Anesthesiology | Admitting: Anesthesiology

## 2013-03-08 DIAGNOSIS — M4802 Spinal stenosis, cervical region: Secondary | ICD-10-CM

## 2013-03-08 DIAGNOSIS — M47812 Spondylosis without myelopathy or radiculopathy, cervical region: Secondary | ICD-10-CM

## 2013-03-08 DIAGNOSIS — M5030 Other cervical disc degeneration, unspecified cervical region: Secondary | ICD-10-CM

## 2013-03-08 NOTE — Progress Notes (Addendum)
Physical Therapy Treatment Patient Details  Name: Douglas Keller MRN: [id number] Date of Birth: [birth year]  Today's Date: 03/08/2013 Time: 0800-0848 PT Time Calculation (min): 48 min Charge: Physical performance testings [phone], TE [id number]  Visit#: 10 of 12  Re-eval: 03/15/13 Assessment Diagnosis: Neck Pain Next MD Visit: Dr. Laurian Brim -   Authorization: [employer]  Authorization Time Period: Roselind Rily complete 9th visit  Authorization Visit#: 10 of 20   Subjective: Symptoms/Limitations Symptoms: Pt stated he was doing well today, reported MD happy with progress and is going to be talking to surgeon about injections or surgery.  Pt not wanting surgery at this point.   Pain Assessment Currently in Pain?: No/denies   Exercise/Treatments Mobility/Balance    Selective functional movement assessment F= functional D= Dysfunctional N= no pain P= painful movement   Cervical flexion   FN Cervical extension   DP Cervical rotation   Lt DP        Rt DP UE pattern 1 (IR)   Lt DP        Rt DP UE pattern 2 (ER)   Lt DP     Rt DP Multisegmental flexion  DP Multisegmental extension DP Multisegmental rotation Lt DN     Rt DP SLS    Lt FN     Rt FN Overhead deep squat  DP        Supine Other Supine Exercises: Corner strech: 3x30 sec holds Other Supine Exercises: SFMA- see print out Physical Therapy Assessment and Plan PT Assessment and Plan Clinical Impression Statement: Selective functional movement asssessment complete, see copy for details.  Findings show impaired postural motor control dysfunction wtih cervical extension and rotation, tissue extensibility dysfunction with Bil shoulders, joint mobility dysfucntion with lumbar flexion and weight bearing motor control dysfunction with spinal extension.  Stretches added to improve tissue extensibilty PT Plan: Next session focus on motor control with cervical and lumbar region to improve AROM and add stretches to improve UE  IR/ER.  Continue with manual technique to anterior ribs 1-5 and clavicular mobility    Goals    Problem List Patient Active Problem List   Diagnosis Date Noted  . Cervical spondylosis without myelopathy 02/13/2013  . Degeneration of cervical intervertebral disc 02/13/2013  . Spinal stenosis in cervical region 02/13/2013    PT - End of Session Activity Tolerance: Patient tolerated treatment well General Behavior During Therapy: [facility] for tasks assessed/performed  GP    Juel Burrow 03/08/2013, 7:31 PM

## 2013-03-10 ENCOUNTER — Ambulatory Visit (HOSPITAL_COMMUNITY)
Admission: RE | Admit: 2013-03-10 | Discharge: 2013-03-10 | Disposition: A | Discharge status: Home / Self Care | Payer: Medicare Other | Source: Ambulatory Visit | Attending: Anesthesiology | Admitting: Anesthesiology

## 2013-03-10 DIAGNOSIS — M4802 Spinal stenosis, cervical region: Secondary | ICD-10-CM

## 2013-03-10 DIAGNOSIS — M47812 Spondylosis without myelopathy or radiculopathy, cervical region: Secondary | ICD-10-CM

## 2013-03-10 DIAGNOSIS — M5030 Other cervical disc degeneration, unspecified cervical region: Secondary | ICD-10-CM

## 2013-03-10 NOTE — Progress Notes (Addendum)
Physical Therapy Treatment Patient Details  Name: Douglas Keller MRN: [id number] Date of Birth: 01/25/1941  Today's Date: 03/10/2013 Time: 4098-1191 PT Time Calculation (min): 45 min Charges:  TE: [id number] Manual: [id number] Visit#: 11 of 16  Re-eval: 03/15/13    Authorization: [employer]  Authorization Time Period: Roselind Rily complete 9th visit  Authorization Visit#: 11 of 20   Subjective: Symptoms/Limitations Symptoms: Reports decreased pain at night time. He is taking different pain medication to assist with his pain.  Plans to play golf next weekend.   Exercise/Treatments Stretches Chest Stretch: 3 reps;20 seconds Theraband Exercises Scapula Retraction: 15 reps;Green Shoulder Extension: 15 reps;Green Rows: 15 reps;Green Horizontal Abduction: Supine Red x10 reps Prone Exercises Other Prone Exercise: Prone On Elbow: rotation x5, flexion/extension x5, serratus anterior x10, scapular retractuion  Prone  Flexion: AROM;Both;5 reps Extension: AROM;Both;5 reps (palms down) External Rotation: Both;5 reps Horizontal ABduction 1: Both;5 reps Other Prone Exercises: Rows x10 reps Standing Flexion: Both;10 reps (on wall) ROM / Strengthening / Isometric Strengthening UBE (Upper Arm Bike): 5 min backwards Thumb Tacks: 15 reps Prot/Ret//Elev/Dep: 10 reps PT faciliation   Manual Therapy Manual Therapy: Other (comment) Joint Mobilization: Grade I-III to Bil clavicals, Bil shoulders to improve ER and abd and flexion w/PROM after to improve flexibilty; Grade II-III to C3-C7  Physical Therapy Assessment and Plan PT Assessment and Plan Clinical Impression Statement: Pt has significant improvement in shoulder AROM and cooridnation after treatment today.  Added manual technquies to address BUE in order to improve latissimus dorsi flexibility PT Frequency: Min 2X/week PT Duration: 4 weeks PT Plan: Improve shoulder ER/IR with T-band, continue with manual to BUE (shoulder and clavicle and  cervical as needed to improve AROM.      Goals    Problem List Patient Active Problem List   Diagnosis Date Noted  . Cervical spondylosis without myelopathy 02/13/2013  . Degeneration of cervical intervertebral disc 02/13/2013  . Spinal stenosis in cervical region 02/13/2013    PT - End of Session Activity Tolerance: Patient tolerated treatment well General Behavior During Therapy: [facility] for tasks assessed/performed PT Plan of Care PT Home Exercise Plan: updated w/tband exercises and provided Red t-band PT Patient Instructions: teach back for posture and HEP Consulted and Agree with Plan of Care: Patient  GP    Linas Stepter, MPT, ATC 03/10/2013, 9:48 AM

## 2013-03-14 ENCOUNTER — Ambulatory Visit (HOSPITAL_COMMUNITY)
Admission: RE | Admit: 2013-03-14 | Discharge: 2013-03-14 | Disposition: A | Discharge status: Home / Self Care | Payer: Medicare Other | Source: Ambulatory Visit | Attending: Family Medicine | Admitting: Family Medicine

## 2013-03-14 DIAGNOSIS — M4802 Spinal stenosis, cervical region: Secondary | ICD-10-CM

## 2013-03-14 DIAGNOSIS — M47812 Spondylosis without myelopathy or radiculopathy, cervical region: Secondary | ICD-10-CM

## 2013-03-14 DIAGNOSIS — M5030 Other cervical disc degeneration, unspecified cervical region: Secondary | ICD-10-CM

## 2013-03-14 NOTE — Progress Notes (Signed)
Physical Therapy Treatment Patient Details  Name: Douglas Keller MRN: [id number] Date of Birth: 08-03-1940  Today's Date: 03/14/2013 Time: 4098-1191 PT Time Calculation (min): 50 min Charge :TE [id number]  Visit#: 12 of 16  Re-eval: 03/15/13 Assessment Diagnosis: Neck Pain Next MD Visit: Dr. Laurian Brim -   Authorization: [employer]  Authorization Time Period: Roselind Rily complete 9th visit  Authorization Visit#: 12 of 20   Subjective: Symptoms/Limitations Symptoms: Pt stated he is pain free today, reported comleteing the rolling exercises last night Pain Assessment Currently in Pain?: No/denies  Objective:   Exercise/Treatments Stretches Chest Stretch: 3 reps;30 seconds Machines for Strengthening UBE (Upper Arm Bike): 6' backward Theraband Exercises Scapula Retraction: 15 reps;Green (HEP) Shoulder Extension: Limitations Shoulder Extension Limitations: HEP Rows: Limitations Rows Limitations: HEP Other Theraband Exercises: IR/ER Bil UE 15x Standing Exercises Wall Push Ups: 10 reps Thumb Tacks: 2 minutes Prone Exercises Other Prone Exercise: Prone On Elbow: rotation x5, flexion/extension x5, serratus anterior x10, scapular retractuion  Prone  Extension: Both;10 reps (palms down) External Rotation: Both;15 reps Internal Rotation: 15 reps Horizontal ABduction 1: Both;10 reps Other Prone Exercises: Rows x15 reps Other Prone Exercises: latissimus dorsi st 3x 30" (child's pose) Supine: PROM BUE all movements  Physical Therapy Assessment and Plan PT Assessment and Plan Clinical Impression Statement: Added exercises to improve shoulder AROM and postural strengthening with noted improved coordination with serratus anterior, less tc and vc-ing required.  Added latissimus dorsi stretch to improve flexibility. Pt reported no increased pain through session.   PT Plan: Improve shoulder ER/IR with T-band, continue with manual to BUE (shoulder and clavicle and cervical as needed to  improve AROM.      Goals    Problem List Patient Active Problem List   Diagnosis Date Noted  . Cervical spondylosis without myelopathy 02/13/2013  . Degeneration of cervical intervertebral disc 02/13/2013  . Spinal stenosis in cervical region 02/13/2013    PT - End of Session Activity Tolerance: Patient tolerated treatment well General Behavior During Therapy: [facility] for tasks assessed/performed  GP    Juel Burrow 03/14/2013, 10:47 AM

## 2013-03-16 ENCOUNTER — Ambulatory Visit (HOSPITAL_COMMUNITY)
Admission: RE | Admit: 2013-03-16 | Discharge: 2013-03-16 | Disposition: A | Discharge status: Home / Self Care | Payer: Medicare Other | Source: Ambulatory Visit | Attending: Anesthesiology | Admitting: Anesthesiology

## 2013-03-16 DIAGNOSIS — M47812 Spondylosis without myelopathy or radiculopathy, cervical region: Secondary | ICD-10-CM

## 2013-03-16 DIAGNOSIS — M4802 Spinal stenosis, cervical region: Secondary | ICD-10-CM

## 2013-03-16 DIAGNOSIS — M5030 Other cervical disc degeneration, unspecified cervical region: Secondary | ICD-10-CM

## 2013-03-16 NOTE — Progress Notes (Signed)
Physical Therapy Treatment Patient Details  Name: Douglas Keller MRN: [id number] Date of Birth: September 01, 1940  Today's Date: 03/16/2013 Time: 0802-0840 PT Time Calculation (min): 38 min Charges: TE: [id number] Manual: [id number] Visit#: 13 of 16  Re-eval: 03/26/13    Authorization: [employer]  Authorization Time Period: Roselind Rily complete 9th visit  Authorization Visit#: 13 of 20   Subjective: Symptoms/Limitations Symptoms: Pt reports that he is doing pretty well.  He is taking his pain medication 3x/day.  He is going to f/u with another neurologist tomorrow "to see what they have to say".   Pain Assessment Currently in Pain?: No/denies  Precautions/Restrictions     Exercise/Treatments Teacher, music: 3 reps;30 seconds Machines for Strengthening UBE (Upper Arm Bike): 5 backward 2.0 Theraband Exercises Shoulder Extension: 20 reps;Blue Shoulder External Rotation: 10 reps;Green (BLE) Other Theraband Exercises: lat pull downs Other Theraband Exercises: Trunk rotataion x10 reps Green BLE Standing Exercises Upper Extremity Flexion with Stabilization: Flexion;10 reps Thumb Tacks: 2 minutes Other Standing Exercises: with SPC: Trunk rotation, Trunk SB, Shoulder IR behind back, shoulder extension, shoulder flexion x15 each direction Other Standing Exercises: pro/ret/elev/dep: x15 with VC and TC for pro and ret movements Manual Therapy Manual Therapy: Other (comment) Other Manual Therapy: PROM with ossiclations to BUE to improve shoulder motion in all directions and utilizing PNF patterns  Physical Therapy Assessment and Plan PT Assessment and Plan Clinical Impression Statement: Added stretches using SPC to mimic stretching with golf club.  Continues to improve his ease of mobilty to RUE and is more restricted without pain to LUE.  Reports significant improvement in ease of swinging SPC after manual techniques. Continue to require mod VC for proper posture and continues to  have difficulty with coordination to with scapula protraction exercises.  PT Plan: Improve shoulder ER/IR with T-band, continue with manual to BUE (shoulder and clavicle and cervical as needed to improve AROM.      Goals Home Exercise Program Pt/caregiver will Perform Home Exercise Program: Independently PT Goal: Perform Home Exercise Program - Progress: Met PT Short Term Goals Time to Complete Short Term Goals: 2 weeks PT Short Term Goal 1: Pt will report pain less than 3/10  PT Short Term Goal 1 - Progress: Met PT Short Term Goal 2: Pt will improve cervical and rt shoulder motion to WNL in order to comfortably turn neck which driving his car.  (cervical WNL, shoulder progressing) PT Short Term Goal 2 - Progress: Partly met PT Short Term Goal 3: Pt will be edcuated and verbalize approprirate posture and importance of proper posture to decrease risk of secondary impairments.  PT Short Term Goal 3 - Progress: Met PT Long Term Goals Time to Complete Long Term Goals:  (6 weeks) PT Long Term Goal 1: Pt will improve NDI to less than 20% for improved QOL.  PT Long Term Goal 1 - Progress: Met PT Long Term Goal 2: Pt will present with minimal forward head posture to decreae chance of secondary impairments. PT Long Term Goal 2 - Progress: Progressing toward goal Long Term Goal 3: Pt will improve his cervical and shoulder coordination in order to swing a golf club with pain less than 3/10 to continue with golf activities.  Long Term Goal 3 Progress: Progressing toward goal  Problem List Patient Active Problem List   Diagnosis Date Noted  . Cervical spondylosis without myelopathy 02/13/2013  . Degeneration of cervical intervertebral disc 02/13/2013  . Spinal stenosis in cervical region 02/13/2013    PT - End  of Session Activity Tolerance: Patient tolerated treatment well General Behavior During Therapy: [facility] for tasks assessed/performed  GP    Britley Gashi, MPT, ATC 03/16/2013, 8:58 AM

## 2013-03-21 ENCOUNTER — Ambulatory Visit (HOSPITAL_COMMUNITY): Payer: Medicare Other

## 2013-03-23 ENCOUNTER — Inpatient Hospital Stay (HOSPITAL_COMMUNITY): Admission: RE | Admit: 2013-03-23 | Payer: Medicare Other | Source: Ambulatory Visit

## 2013-03-28 ENCOUNTER — Ambulatory Visit (HOSPITAL_COMMUNITY): Payer: Medicare Other | Admitting: Physical Therapy

## 2013-03-30 ENCOUNTER — Inpatient Hospital Stay (HOSPITAL_COMMUNITY)
Admission: RE | Admit: 2013-03-30 | Discharge: 2013-03-30 | Disposition: A | Discharge status: Home / Self Care | Payer: Medicare Other | Source: Ambulatory Visit | Attending: Physical Therapy | Admitting: Physical Therapy

## 2013-03-30 DIAGNOSIS — M4802 Spinal stenosis, cervical region: Secondary | ICD-10-CM

## 2013-03-30 DIAGNOSIS — M5030 Other cervical disc degeneration, unspecified cervical region: Secondary | ICD-10-CM

## 2013-03-30 DIAGNOSIS — M47812 Spondylosis without myelopathy or radiculopathy, cervical region: Secondary | ICD-10-CM

## 2013-03-30 NOTE — Progress Notes (Signed)
Physical Therapy Discharge Note  Patient Details  Name: Douglas Keller MRN: [id number] Date of Birth: [birth year]  Today's Date: 03/30/2013   Authorization: [employer]  Authorization Time Period: Roselind Rily complete 9th visit  Authorization Visit#:   of     Subjective:    Spoke with pt today on the phone and pt reports that he is feeling pretty good. He had a f/u with neurologist and stated he was going to have surgery on his neck.  Will d/c from PT at this time.     Problem List Patient Active Problem List   Diagnosis Date Noted  . Cervical spondylosis without myelopathy 02/13/2013  . Degeneration of cervical intervertebral disc 02/13/2013  . Spinal stenosis in cervical region 02/13/2013       GP Functional Limitation: Changing and maintaining body position Changing and Maintaining Body Position Goal Status [phone]): At least 1 percent but less than 20 percent impaired, limited or restricted Changing and Maintaining Body Position Discharge Status [phone]): At least 20 percent but less than 40 percent impaired, limited or restricted  Kla Bily, MPT, ATC 03/30/2013, 8:14 AM

## 2013-05-28 ENCOUNTER — Encounter (HOSPITAL_COMMUNITY): Payer: Self-pay | Admitting: Emergency Medicine

## 2013-05-28 ENCOUNTER — Emergency Department (HOSPITAL_COMMUNITY)
Admission: EM | Admit: 2013-05-28 | Discharge: 2013-05-28 | Disposition: A | Discharge status: Home / Self Care | Payer: Medicare Other | Attending: Emergency Medicine | Admitting: Emergency Medicine

## 2013-05-28 ENCOUNTER — Emergency Department (HOSPITAL_COMMUNITY): Payer: Medicare Other

## 2013-05-28 DIAGNOSIS — K5900 Constipation, unspecified: Secondary | ICD-10-CM | POA: Insufficient documentation

## 2013-05-28 DIAGNOSIS — Z79899 Other long term (current) drug therapy: Secondary | ICD-10-CM | POA: Insufficient documentation

## 2013-05-28 DIAGNOSIS — I10 Essential (primary) hypertension: Secondary | ICD-10-CM | POA: Insufficient documentation

## 2013-05-28 DIAGNOSIS — Z7982 Long term (current) use of aspirin: Secondary | ICD-10-CM | POA: Insufficient documentation

## 2013-05-28 DIAGNOSIS — E7800 Pure hypercholesterolemia, unspecified: Secondary | ICD-10-CM | POA: Insufficient documentation

## 2013-05-28 NOTE — ED Provider Notes (Addendum)
CSN: [id number]     Arrival date & time 05/28/13  1447 History   First MD Initiated Contact with Patient 05/28/13 1503    Scribed for Benny Lennert, MD, the patient was seen in room APA18/APA18. This chart was scribed by Lewanda Rife, ED scribe. Patient's care was started at 3:47 PM  Chief Complaint  Patient presents with  . Constipation   (Consider location/radiation/quality/duration/timing/severity/associated sxs/prior Treatment) Patient is a 72 y.o. male presenting with constipation. The history is provided by the patient. No language interpreter was used.  Constipation Severity:  No pain Time since last bowel movement:  12 hours Timing:  Constant Chronicity:  New Context: narcotics (recently prescribed )   Stool description:  None produced Relieved by:  Nothing Worsened by:  Nothing tried Ineffective treatments:  Stool softeners Associated symptoms: no abdominal pain, no diarrhea and no fever    HPI Comments: Douglas Keller is a 72 y.o. male who presents to the Emergency Department complaining of constant constipation onset 12 hours. Describes feeling "pressure" when trying to use the bathroom. Reports associated hard stools for the past few days. Reports recently taking narcotic pain medications post neck surgery. Reports trying milk of magnesia since 2 pm with no relief of symptoms. Denies associated abdominal pain, bloody stools, nausea, emesis, or any pain.   Past Medical History  Diagnosis Date  . Hypertension   . Hypercholesteremia    Past Surgical History  Procedure Laterality Date  . Knee arthroscopy    . Colonoscopy  07/26/2012    Procedure: COLONOSCOPY;  Surgeon: Dalia Heading, MD;  Location: [facility];  Service: Gastroenterology;  Laterality: N/A;   Family History  Problem Relation Age of Onset  . Colon cancer Neg Hx   . Asthma Mother   . Hypertension Father    History  Substance Use Topics  . Smoking status: Never Smoker   . Smokeless tobacco:  Not on file  . Alcohol Use: No    Review of Systems  Constitutional: Negative for fever, appetite change and fatigue.  HENT: Negative for congestion, ear discharge and sinus pressure.   Eyes: Negative for discharge.  Respiratory: Negative for cough.   Cardiovascular: Negative for chest pain.  Gastrointestinal: Positive for constipation. Negative for abdominal pain and diarrhea.  Genitourinary: Negative for frequency and hematuria.  Skin: Negative for rash.  Neurological: Negative for seizures and headaches.  Psychiatric/Behavioral: Negative for hallucinations.    Allergies  Review of patient's allergies indicates no known allergies.  Home Medications   Current Outpatient Rx  Name  Route  Sig  Dispense  Refill  . amLODipine (NORVASC) 10 MG tablet   Oral   Take 1 tablet (10 mg total) by mouth daily.   30 tablet   6   . aspirin EC 81 MG tablet   Oral   Take 81 mg by mouth at bedtime.         . fish oil-omega-3 fatty acids 1000 MG capsule   Oral   Take 1 g by mouth 2 (two) times daily.         Marland Kitchen GLUCOSAMINE PO   Oral   Take 1 tablet by mouth at bedtime.         . hydrochlorothiazide (HYDRODIURIL) 25 MG tablet   Oral   Take 1 tablet (25 mg total) by mouth daily.   30 tablet   6   . lisinopril (PRINIVIL,ZESTRIL) 40 MG tablet   Oral   Take 1 tablet (40 mg  total) by mouth daily.   30 tablet   6   . Multiple Vitamin (MULITIVITAMIN WITH MINERALS) TABS   Oral   Take 1 tablet by mouth daily.         Marland Kitchen EXPIRED: potassium chloride (K-DUR) 10 MEQ tablet   Oral   Take 1 tablet (10 mEq total) by mouth 2 (two) times daily.   6 tablet   0   . potassium chloride (KLOR-CON M10) 10 MEQ tablet   Oral   Take 1 tablet (10 mEq total) by mouth daily.   30 tablet   6   . rosuvastatin (CRESTOR) 20 MG tablet   Oral   Take 20 mg by mouth at bedtime.          BP 136/82  Pulse 75  Temp(Src) 98.6 F (37 C) (Oral)  Resp 18  Ht 5\' 7"  (1.702 m)  Wt 195 lb (88.451  kg)  BMI 30.53 kg/m2  SpO2 96% Physical Exam  Nursing note and vitals reviewed. Constitutional: He is oriented to person, place, and time. He appears well-developed.  HENT:  Head: Normocephalic.  Eyes: Conjunctivae and EOM are normal. No scleral icterus.  Neck: Neck supple. No thyromegaly present.  Cardiovascular: Normal rate and regular rhythm.  Exam reveals no gallop and no friction rub.   No murmur heard. Pulmonary/Chest: No stridor. He has no wheezes. He has no rales. He exhibits no tenderness.  Abdominal: He exhibits no distension. There is no tenderness. There is no rebound.  Musculoskeletal: Normal range of motion. He exhibits no edema.  Lymphadenopathy:    He has no cervical adenopathy.  Neurological: He is oriented to person, place, and time. He exhibits normal muscle tone. Coordination normal.  Skin: No rash noted. No erythema.  Psychiatric: He has a normal mood and affect. His behavior is normal.    ED Course  Procedures (including critical care time) COORDINATION OF CARE:  Nursing notes reviewed. Vital signs reviewed. Initial pt interview and examination performed.   3:57 PM-Discussed work up plan with pt at bedside, which includes abdominal x-ray. Pt agrees with plan.   Treatment plan initiated:Medications - No data to display   Initial diagnostic testing ordered.    Labs Review Labs Reviewed - No data to display Imaging Review No results found.  EKG Interpretation   None       MDM  No diagnosis found.   The chart was scribed for me under my direct supervision.  I personally performed the history, physical, and medical decision making and all procedures in the evaluation of this patient.Benny Lennert, MD 05/28/13 1644  Benny Lennert, MD 05/28/13 [phone]

## 2013-05-28 NOTE — ED Notes (Signed)
Pt. Arrives to the ED this morning with complaints of constipation. Pt. Stated he tried to have a bowel movement this morning but was unable. Pt. Said after he got home from church he felt as if he needed to rush to the bathroom to have a bowel movement but when he got to the bathroom he was unable to go. Patient noticed a small amount of blood when wiping along with a hardened area at the rectum. Patient denies pain but states that he "feels pressure, like I need to go to the bathroom".

## 2013-07-06 ENCOUNTER — Encounter: Payer: Self-pay | Admitting: Cardiovascular Disease

## 2013-07-06 ENCOUNTER — Ambulatory Visit (INDEPENDENT_AMBULATORY_CARE_PROVIDER_SITE_OTHER): Payer: Medicare Other | Admitting: Cardiovascular Disease

## 2013-07-06 VITALS — BP 130/90 | HR 68 | Resp 20 | Ht 67.0 in | Wt 197.0 lb

## 2013-07-06 DIAGNOSIS — I10 Essential (primary) hypertension: Secondary | ICD-10-CM

## 2013-07-06 DIAGNOSIS — E785 Hyperlipidemia, unspecified: Secondary | ICD-10-CM

## 2013-07-06 HISTORY — DX: Hyperlipidemia, unspecified: E78.5

## 2013-07-06 HISTORY — DX: Essential (primary) hypertension: I10

## 2013-07-06 NOTE — Patient Instructions (Signed)
Your physician recommends that you schedule a follow-up appointment in: 12 months Your physician discussed the importance of regular exercise and recommended that you continue a regular exercise program for good health.

## 2013-07-09 NOTE — Progress Notes (Signed)
[name], male   DOB: 05-16-1941, 72 y.o.   MRN: [id number]     Reason for office visit HTN hyperlipidemia, mild coronary atherosclerosis  Douglas Keller is doing well from a cardiovascular standpoint but is having some neurological complaints. He has some pain and numbness in his right leg since his cervical spine surgery. Is either to return to exercising but he worries about the implications of this neurological symptoms. He denies shortness of breath, chest pain, edema, dizziness or syncope.  Blood pressure control has been good with typical blood pressure at home in the 120s over 70s. His most recent lipid profile performed roughly a year ago showed favorable findings with total cholesterol 129, triglycerides 77, HDL 37 and his hemoglobin X9J at that time was 6.5% without any hypoglycemic agents. He has lost roughly 4 pounds since his last appointment.   No Known Allergies  Current Outpatient Prescriptions  Medication Sig Dispense Refill  . amLODipine (NORVASC) 10 MG tablet Take 1 tablet (10 mg total) by mouth daily.  30 tablet  6  . aspirin EC 81 MG tablet Take 81 mg by mouth at bedtime.      . fish oil-omega-3 fatty acids 1000 MG capsule Take 1 g by mouth 2 (two) times daily.      Marland Kitchen gabapentin (NEURONTIN) 300 MG capsule Take 300 mg by mouth 3 (three) times daily.      Marland Kitchen GLUCOSAMINE PO Take 1 tablet by mouth at bedtime.      . hydrochlorothiazide (HYDRODIURIL) 25 MG tablet Take 1 tablet (25 mg total) by mouth daily.  30 tablet  6  . lisinopril (PRINIVIL,ZESTRIL) 40 MG tablet Take 1 tablet (40 mg total) by mouth daily.  30 tablet  6  . Multiple Vitamin (MULITIVITAMIN WITH MINERALS) TABS Take 1 tablet by mouth daily.      . potassium chloride (KLOR-CON M10) 10 MEQ tablet Take 1 tablet (10 mEq total) by mouth daily.  30 tablet  6  . rosuvastatin (CRESTOR) 20 MG tablet Take 20 mg by mouth at bedtime.       No current facility-administered medications for this visit.     Past Medical History  Diagnosis Date  . Hypertension   . Hypercholesteremia   . Hyperlipidemia 07/06/2013  . HTN (hypertension) 07/06/2013    Past Surgical History  Procedure Laterality Date  . Knee arthroscopy    . Colonoscopy  07/26/2012    Procedure: COLONOSCOPY;  Surgeon: Dalia Heading, MD;  Location: [facility];  Service: Gastroenterology;  Laterality: N/A;    Family History  Problem Relation Age of Onset  . Colon cancer Neg Hx   . Asthma Mother   . Hypertension Father     History   Social History  . Marital Status: Widowed    Spouse Name: N/A    Number of Children: N/A  . Years of Education: N/A   Occupational History  . Not on file.   Social History Main Topics  . Smoking status: Never Smoker   . Smokeless tobacco: Not on file  . Alcohol Use: No  . Drug Use: No  . Sexual Activity: Not on file   Other Topics Concern  . Not on file   Social History Narrative  . No narrative on file    Review of systems: The patient specifically denies any chest pain at rest or with exertion, dyspnea at rest or with exertion, orthopnea, paroxysmal nocturnal dyspnea, syncope, palpitations, focal neurological deficits, intermittent claudication,  lower extremity edema, unexplained weight gain, cough, hemoptysis or wheezing.  The patient also denies abdominal pain, nausea, vomiting, dysphagia, diarrhea, constipation, polyuria, polydipsia, dysuria, hematuria, frequency, urgency, abnormal bleeding or bruising, fever, chills, unexpected weight changes, mood swings, change in skin or hair texture, change in voice quality, auditory or visual problems, allergic reactions or rashes, new musculoskeletal complaints other than usual "aches and pains".   PHYSICAL EXAM BP 130/90  Pulse 68  Resp 20  Ht 5\' 7"  (1.702 m)  Wt 197 lb (89.359 kg)  BMI 30.85 kg/m2  General: Alert, oriented x3, no distress Head: no evidence of trauma, PERRL, EOMI, no exophtalmos or lid lag, no  myxedema, no xanthelasma; normal ears, nose and oropharynx Neck: normal jugular venous pulsations and no hepatojugular reflux; brisk carotid pulses without delay and no carotid bruits Chest: clear to auscultation, no signs of consolidation by percussion or palpation, normal fremitus, symmetrical and full respiratory excursions Cardiovascular: normal position and quality of the apical impulse, regular rhythm, normal first and second heart sounds, no rubs or gallops, early peaking 2/6 systolic ejection murmur in the aortic focus Abdomen: no tenderness or distention, no masses by palpation, no abnormal pulsatility or arterial bruits, normal bowel sounds, no hepatosplenomegaly Extremities: no clubbing, cyanosis or edema; 2+ radial, ulnar and brachial pulses bilaterally; 2+ right femoral, posterior tibial and dorsalis pedis pulses; 2+ left femoral, posterior tibial and dorsalis pedis pulses; no subclavian or femoral bruits Neurological: grossly nonfocal   EKG: Sinus rhythm, poor R wave progression 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 but no repolarization abnormalities  Lipid Panel December 2013 total cholesterol 129, triglycerides 77, HDL 37, LDL 77 and his hemoglobin Z6X at that time was 6.5%    BMET    Component Value Date/Time   NA 139 08/14/2011 2028   K 3.3* 08/14/2011 2028   CL 103 08/14/2011 2028   CO2 27 08/14/2011 2028   GLUCOSE 119* 08/14/2011 2028   BUN 18 08/14/2011 2028   CREATININE 1.04 08/14/2011 2028   CALCIUM 9.0 08/14/2011 2028   GFRNONAA 71* 08/14/2011 2028   GFRAA 83* 08/14/2011 2028     ASSESSMENT AND PLAN Douglas Keller well-controlled coronary risk factors, although he would benefit from additional weight loss and resumption of physical exercise to help improve his glycemic control even more. Needs a repeat lipid profile which is scheduled for next month. He has no symptoms to suggest progression of coronary atherosclerosis. He has a early peaking aortic ejection murmur  suggestive of aortic valve sclerosis and has not had an echocardiogram since 2003. May need to repeat an echocardiogram in the near future especially if he develops any cardiovascular complaints  Patient Instructions  Your physician recommends that you schedule a follow-up appointment in: 12 months Your physician discussed the importance of regular exercise and recommended that you continue a regular exercise program for good health.       Orders Placed This Encounter  Procedures  . EKG 12-Lead   Meds ordered this encounter  Medications  . gabapentin (NEURONTIN) 300 MG capsule    Sig: Take 300 mg by mouth 3 (three) times daily.    Junious Silk, MD, [facility] [phone] office [phone] pager

## 2013-07-18 ENCOUNTER — Ambulatory Visit (HOSPITAL_COMMUNITY)
Admission: RE | Admit: 2013-07-18 | Discharge: 2013-07-18 | Disposition: A | Discharge status: Home / Self Care | Payer: Medicare Other | Source: Ambulatory Visit | Attending: Neurosurgery | Admitting: Neurosurgery

## 2013-07-18 DIAGNOSIS — IMO0001 Reserved for inherently not codable concepts without codable children: Secondary | ICD-10-CM | POA: Insufficient documentation

## 2013-07-18 DIAGNOSIS — I10 Essential (primary) hypertension: Secondary | ICD-10-CM | POA: Insufficient documentation

## 2013-07-18 DIAGNOSIS — M25619 Stiffness of unspecified shoulder, not elsewhere classified: Secondary | ICD-10-CM | POA: Insufficient documentation

## 2013-07-18 DIAGNOSIS — M62838 Other muscle spasm: Secondary | ICD-10-CM | POA: Insufficient documentation

## 2013-07-18 DIAGNOSIS — M25519 Pain in unspecified shoulder: Secondary | ICD-10-CM | POA: Insufficient documentation

## 2013-07-18 DIAGNOSIS — M25612 Stiffness of left shoulder, not elsewhere classified: Secondary | ICD-10-CM

## 2013-07-19 DIAGNOSIS — M25612 Stiffness of left shoulder, not elsewhere classified: Secondary | ICD-10-CM

## 2013-07-19 HISTORY — DX: Stiffness of left shoulder, not elsewhere classified: M25.612

## 2013-07-19 NOTE — Evaluation (Signed)
Physical Therapy Evaluation  Patient Details  Name: Douglas Keller MRN: [id number] Date of Birth: 11/02/1940  Today's Date: 07/19/2013 Time: 4098-1191 PT Time Calculation (min): 48 min Charges: 1 evaluation             Visit#: 1 of 10  Re-eval: 08/18/13 Assessment Diagnosis: post op cercial fusion for cervical spondylosis Surgical Date: 05/06/13 Next MD Visit: Dr. Channing Mutters 08/10/13  Authorization: Medicare    Authorization Time Period:    Authorization Visit#: 1 of 10   Past Medical History:  Past Medical History  Diagnosis Date  . Hypertension   . Hypercholesteremia   . Hyperlipidemia 07/06/2013  . HTN (hypertension) 07/06/2013   Past Surgical History:  Past Surgical History  Procedure Laterality Date  . Knee arthroscopy    . Colonoscopy  07/26/2012    Procedure: COLONOSCOPY;  Surgeon: Dalia Heading, MD;  Location: [facility];  Service: Gastroenterology;  Laterality: N/A;    Subjective Symptoms/Limitations Symptoms: Pt is a 72 year old male referred to PT s/p cervical fusion C3/4. 4/, 5/6, 6/7 with posterior approach on 05/06/13.  He currently denies pain and has most difficulty with his ROM and would like to be better by the spring so he can continue with golf activities.  He reports he has continued with his HEP.   Patient Stated Goals: "completly healthy, back to golf." Pain Assessment Currently in Pain?: Yes Pain Score: 2  Pain Location: Shoulder Pain Orientation: Left;Right Pain Type: Chronic pain;Surgical pain  Precautions/Restrictions     Balance Screening Balance Screen Has the patient fallen in the past 6 months: No Has the patient had a decrease in activity level because of a fear of falling? : Yes Is the patient reluctant to leave their home because of a fear of falling? : No  Prior Functioning  Prior Function Level of Independence: Independent with basic ADLs  Able to Take Stairs?: Yes Driving: Yes Vocation: Retired Comments: Enjoys Psychiatrist  Cognition/Observation Observation/Other Assessments Observations: Rt upper trapezieus atrophy  Sensation/Coordination/Flexibility/Functional Tests Coordination Gross Motor Movements are Fluid and Coordinated: No Coordination and Movement Description: Lt scapula dyskinesis, mod A with scapular depression Functional Tests Functional Tests: FOTO: 55/45  Assessment RUE AROM (degrees) RUE Overall AROM Comments: Supine position Right Shoulder Flexion: 175 Degrees Right Shoulder ABduction: 170 Degrees Right Shoulder Internal Rotation: 55 Degrees Right Shoulder External Rotation: 70 Degrees RUE PROM (degrees) RUE Overall PROM Comments: Supine position Right Shoulder Flexion: 180 Degrees LUE AROM (degrees) LUE Overall AROM Comments: Supine postion Left Shoulder Extension: 50 Degrees Left Shoulder Flexion: 155 Degrees Left Shoulder ABduction: 150 Degrees Left Shoulder Internal Rotation: 60 Degrees Left Shoulder External Rotation: 75 Degrees LUE PROM (degrees) LUE Overall PROM Comments: Supine postion Left Shoulder Extension: 55 Degrees Left Shoulder Flexion: 165 Degrees Left Shoulder ABduction: 170 Degrees Left Shoulder Internal Rotation: 65 Degrees Cervical Assessment Cervical Assessment: Exceptions to [facility] Cervical AROM Cervical - Right Side Bend: 19 cm (decreased (90%) Cervical - Left Side Bend: 18 cm (decreased 80%) Cervical - Right Rotation: 19 cm ( decreased 60% Cervical - Left Rotation: 20 cm (decreased 70% Cervical Strength Overall Cervical Strength Comments: Rt-Lt: serratus posterior: 3/5-3/5, low trap: 3/5-3/5, mid trap: 3/5-3/5  Cervical Flexion: 4/5 Cervical Extension: 4/5 Cervical - Right Side Bend: 4/5 Cervical - Left Side Bend: 4/5 Cervical - Right Rotation: 4/5 Cervical - Left Rotation: 4/5 Palpation Palpation: decreased Lt shoulder mobility, decreased thoracic PA mobility, significant muscle spasms and maximal fascial restrictions thorughout thoracic  spine, scapular  region  Mobility/Balance  Ambulation/Gait Ambulation/Gait: Yes Gait Pattern: Antalgic Posture/Postural Control Posture/Postural Control: Postural limitations Postural Limitations: upper cross syndrome   Exercise/Treatments Supine Other Supine Exercises: Star gazers x5 Seated Other Seated Exercises: Backwards shoulder rolls x10 Other Seated Exercises: Cervical Retraction: x5 w/PT facilaition Prone  Extension: Both;5 reps;Limitations Extension Limitations: palms down Horizontal ABduction 1: Both;5 reps Other Prone Exercises: Rows x5 Standing Other Standing Exercises: Facing wall: shoulder lift offs x5 BUE    Physical Therapy Assessment and Plan PT Assessment and Plan Clinical Impression Statement: Pt is a 72 year old male referred to PT s/p cervical fusion with impairments listed below.  Pt had attempted conservative treatment including PT this past year and was educated on proper exercises.  After evaluation if is found that he has significant limiations with his Lt shoulder mobility and decreased scapular coordination.  Pt will benefit from skilled therapeutic intervention in order to improve on the following deficits: Pain;Decreased strength;Decreased range of motion;Decreased coordination;Improper body mechanics;Impaired perceived functional ability;Increased muscle spasms;Increased fascial restricitons;Decreased mobility Rehab Potential: Good PT Frequency: Min 2X/week PT Duration: 6 weeks PT Treatment/Interventions: Therapeutic activities;Therapeutic exercise;Neuromuscular re-education;Patient/family education;Manual techniques;Modalities PT Plan: PROM and AROM for Lt shoulder with manual techniques to scapular and cervical region.  Continue with dowel rod exercises to improve AROM and serratus anterior and posterior strengthening.      Goals Home Exercise Program Pt/caregiver will Perform Home Exercise Program: Independently PT Goal: Perform Home Exercise  Program - Progress: Goal set today PT Short Term Goals Time to Complete Short Term Goals: 3 weeks PT Short Term Goal 1: Pt will improve his Lt shoulder AROM flexion and abduction to 170 degrees.   PT Short Term Goal 2: Pt will improve his serratus posterior strength to 3+/5 to start with prone exercises to improve scapular and cervical strength.  PT Short Term Goal 3: Pt will improve his cervical AROM by 2 cm in each direction (or by 10%) for greater ease when driving. PT Long Term Goals Time to Complete Long Term Goals:  (6 weeks) PT Long Term Goal 1: Pt will improve his shoulder Lt shoulder AROM to WNL in order to have greater rotation when swinging a golf club.  PT Long Term Goal 2: Pt will improve his cervical AROM by 50% in order to have greater ease with swinging a golf club.  Long Term Goal 3: Pt will improve his scapular and cervical musculature strength to [facility] in order to sit with approprirate posture with min cueing x15 minutes to decrease risk of secondary injury.  Long Term Goal 4: Pt will improve his FOTO to limitation less than 40% for improved percieved functional ability.   Problem List Patient Active Problem List   Diagnosis Date Noted  . Stiffness of left shoulder joint 07/19/2013  . Hyperlipidemia 07/06/2013  . HTN (hypertension) 07/06/2013  . Cervical spondylosis without myelopathy 02/13/2013  . Degeneration of cervical intervertebral disc 02/13/2013  . Spinal stenosis in cervical region 02/13/2013    PT - End of Session Activity Tolerance: Patient tolerated treatment well PT Plan of Care PT Home Exercise Plan: given PT Patient Instructions: importance of HEP, posture and shoulder mobility.  Consulted and Agree with Plan of Care: Patient  GP Functional Assessment Tool Used: FOTO: 55/45 Functional Limitation: Other PT primary Other PT Primary Current Status (Z6109): At least 40 percent but less than 60 percent impaired, limited or restricted Other PT Primary Goal  Status (U0454): At least 40 percent but less than 60 percent  impaired, limited or restricted  Shiori Adcox, MPT, ATC 07/19/2013, 9:18 AM  Physician Documentation Your signature is required to indicate approval of the treatment plan as stated above.  Please sign and either send electronically or make a copy of this report for your files and return this physician signed original.   Please mark one 1.__approve of plan  2. ___approve of plan with the following conditions.   ______________________________                                                          _____________________ Physician Signature                                                                                                             Date

## 2013-07-25 ENCOUNTER — Ambulatory Visit (HOSPITAL_COMMUNITY)
Admission: RE | Admit: 2013-07-25 | Discharge: 2013-07-25 | Disposition: A | Discharge status: Home / Self Care | Payer: Medicare HMO | Source: Ambulatory Visit | Attending: Orthopedic Surgery | Admitting: Orthopedic Surgery

## 2013-07-25 DIAGNOSIS — I10 Essential (primary) hypertension: Secondary | ICD-10-CM | POA: Insufficient documentation

## 2013-07-25 DIAGNOSIS — M25619 Stiffness of unspecified shoulder, not elsewhere classified: Secondary | ICD-10-CM | POA: Insufficient documentation

## 2013-07-25 DIAGNOSIS — M25519 Pain in unspecified shoulder: Secondary | ICD-10-CM | POA: Insufficient documentation

## 2013-07-25 DIAGNOSIS — M62838 Other muscle spasm: Secondary | ICD-10-CM | POA: Insufficient documentation

## 2013-07-25 DIAGNOSIS — IMO0001 Reserved for inherently not codable concepts without codable children: Secondary | ICD-10-CM | POA: Insufficient documentation

## 2013-07-25 NOTE — Progress Notes (Signed)
Physical Therapy Treatment Patient Details  Name: Douglas Keller MRN: [id number] Date of Birth: 02-20-1941  Today's Date: 07/25/2013 Time: 0936-1000 PT Time Calculation (min): 24 min  Visit#: 2 of 10  Re-eval: 08/18/13 Authorization: Medicare  Authorization Visit#: 2 of 10  Charges:  therex 24'  Subjective: Symptoms/Limitations Symptoms: Pt states he is having no pain or soreness.  Reports he's continuing his HEP.  Exercise/Treatments Supine Flexion: 10 reps;Limitations Flexion Limitations: 2# dowel Other Supine Exercises: Star gazers x10 Prone  Extension: Both;Limitations;10 reps Extension Limitations: palms down Horizontal ABduction 1: Both;10 reps Other Prone Exercises: Rows 10 reps Other Prone Exercises: POE protraction/retraction 10 reps Standing Other Standing Exercises: Facing wall: shoulder flexion with serratus anterior lift offs 2X10 reps each  ROM / Strengthening / Isometric Strengthening UBE (Upper Arm Bike): 6', 3'fwd,3'bkwd      Physical Therapy Assessment and Plan PT Assessment and Plan Clinical Impression Statement: Added therex to target serratus anterior with good results.  Noted weakness in Lt shoulder greater than Rt.  Pt encouraged to perform all exercises bilaterally.  Pt reports compliance with HEP.  Gradually adding therex to avoid mm soreness.   PT Plan: Continue to progress exercises.       Problem List Patient Active Problem List   Diagnosis Date Noted  . Stiffness of left shoulder joint 07/19/2013  . Hyperlipidemia 07/06/2013  . HTN (hypertension) 07/06/2013  . Cervical spondylosis without myelopathy 02/13/2013  . Degeneration of cervical intervertebral disc 02/13/2013  . Spinal stenosis in cervical region 02/13/2013    PT - End of Session Activity Tolerance: Patient tolerated treatment well PT Plan of Care Consulted and Agree with Plan of Care: Patient      Teena Irani, PTA/CLT 07/25/2013, 10:17 AM

## 2013-07-27 ENCOUNTER — Ambulatory Visit (HOSPITAL_COMMUNITY)
Admission: RE | Admit: 2013-07-27 | Discharge: 2013-07-27 | Disposition: A | Discharge status: Home / Self Care | Payer: Medicare HMO | Source: Ambulatory Visit | Attending: Family Medicine | Admitting: Family Medicine

## 2013-07-27 DIAGNOSIS — M25612 Stiffness of left shoulder, not elsewhere classified: Secondary | ICD-10-CM

## 2013-07-27 NOTE — Progress Notes (Signed)
Physical Therapy Treatment Patient Details  Name: Douglas Keller MRN: [id number] Date of Birth: Apr 15, 1941  Today's Date: 07/27/2013 Time: 3244-0102 PT Time Calculation (min): 39 min Charge:TE [id number]  Visit#: 3 of 10  Re-eval: 08/18/13 Assessment Diagnosis: post op cercial fusion for cervical spondylosis Surgical Date: 05/06/13 Next MD Visit: Dr. Carloyn Manner 08/10/13  Authorization: Medicare  Authorization Time Period:    Authorization Visit#: 3 of 10   Subjective: Symptoms/Limitations Symptoms: Pt stated he is currently pain free Pain Assessment Currently in Pain?: No/denies  Objective:  Exercise/Treatments Supine Flexion: 10 reps;Limitations Flexion Limitations: 3# dowel Other Supine Exercises: Star gazers x10 Seated Other Seated Exercises: Backwards shoulder rolls x10 Other Seated Exercises: Cervical Retraction: x10 w/PT facilaition; wbacks Prone  Extension: Both;Limitations;10 reps Extension Limitations: palms down Horizontal ABduction 1: Both;10 reps Other Prone Exercises: Rows 10 reps Other Prone Exercises: POE protraction/retraction 10 reps Standing Extension: 10 reps;Theraband Theraband Level (Shoulder Extension): Level 3 (Green) Row: 10 reps;Theraband Theraband Level (Shoulder Row): Level 3 (Green) Retraction: 10 reps;Theraband Theraband Level (Shoulder Retraction): Level 3 (Green) Other Standing Exercises: Facing wall: shoulder flexion with serratus anterior lift offs 2X10 reps each  Other Standing Exercises: angel wings 5x 5" ROM / Strengthening / Isometric Strengthening UBE (Upper Arm Bike): 6', 3'fwd,3'bkwd @ 2.0    Physical Therapy Assessment and Plan PT Assessment and Plan Clinical Impression Statement: Educated pt on proper posture to reduce forward head.  Added postural strengthenig exercises and exercises to improve serratus musculature strength and improve ER Bil LE.  Pt did required therapist facilitation for proper musculature activation   No  reports of pain    Goals Home Exercise Program Pt/caregiver will Perform Home Exercise Program: Independently PT Short Term Goals Time to Complete Short Term Goals: 3 weeks PT Short Term Goal 1: Pt will improve his Lt shoulder AROM flexion and abduction to 170 degrees.   PT Short Term Goal 2: Pt will improve his serratus posterior strength to 3+/5 to start with prone exercises to improve scapular and cervical strength.  PT Short Term Goal 2 - Progress: Progressing toward goal PT Short Term Goal 3: Pt will improve his cervical AROM by 2 cm in each direction (or by 10%) for greater ease when driving. PT Long Term Goals Time to Complete Long Term Goals:  (6 weeks) PT Long Term Goal 1: Pt will improve his shoulder Lt shoulder AROM to WNL in order to have greater rotation when swinging a golf club.  PT Long Term Goal 2: Pt will improve his cervical AROM by 50% in order to have greater ease with swinging a golf club.  PT Long Term Goal 2 - Progress: Progressing toward goal Long Term Goal 3: Pt will improve his scapular and cervical musculature strength to [facility] in order to sit with approprirate posture with min cueing x15 minutes to decrease risk of secondary injury.  Long Term Goal 3 Progress: Progressing toward goal Long Term Goal 4: Pt will improve his FOTO to limitation less than 40% for improved percieved functional ability.   Problem List Patient Active Problem List   Diagnosis Date Noted  . Stiffness of left shoulder joint 07/19/2013  . Hyperlipidemia 07/06/2013  . HTN (hypertension) 07/06/2013  . Cervical spondylosis without myelopathy 02/13/2013  . Degeneration of cervical intervertebral disc 02/13/2013  . Spinal stenosis in cervical region 02/13/2013    PT - End of Session Activity Tolerance: Patient tolerated treatment well General Behavior During Therapy: [facility] for tasks assessed/performed  GP    Nickola Major,  Tessie Eke 07/27/2013, 12:03 PM

## 2013-08-01 ENCOUNTER — Ambulatory Visit (HOSPITAL_COMMUNITY)
Admission: RE | Admit: 2013-08-01 | Discharge: 2013-08-01 | Disposition: A | Discharge status: Home / Self Care | Payer: Medicare HMO | Source: Ambulatory Visit | Attending: Orthopedic Surgery | Admitting: Orthopedic Surgery

## 2013-08-01 NOTE — Progress Notes (Signed)
Physical Therapy Treatment Patient Details  Name: Douglas Keller MRN: [id number] Date of Birth: Feb 04, 1941  Today's Date: 08/01/2013 Time: 1015-1055 PT Time Calculation (min): 40 min Visit#: 3 of 10  Re-eval: 08/18/13 Authorization: Medicare  Authorization Visit#: 3 of 10  Charges:  therex 72'  Subjective:  Pt states he is currently without pain today and therapy is really helping.   Exercise/Treatments Supine Other Supine Exercises: Star gazers x10 Seated Other Seated Exercises: Backwards shoulder rolls x15 Other Seated Exercises: Cervical Retraction: x15 w/PT facilaition; wbacks 15 reps Prone  Extension: Both;Limitations;15 reps Extension Limitations: palms down Horizontal ABduction 1: Both;15 reps Other Prone Exercises: Rows 15 reps Other Prone Exercises: POE protraction/retraction 15 reps, cervical lateral flexion 10 reps each, cervical rotation 10 reps each Standing Extension: 15 reps Theraband Level (Shoulder Extension): Level 3 (Green) Row: 15 reps Theraband Level (Shoulder Row): Level 3 (Green) Retraction: 15 reps Theraband Level (Shoulder Retraction): Level 3 (Green) Other Standing Exercises: Facing wall: shoulder flexion with serratus anterior lift offs 2X10 reps each  Other Standing Exercises: back against wall: angel wings 10x 5", B UE flexion 10X5" ROM / Strengthening / Isometric Strengthening UBE (Upper Arm Bike): 6', 3'fwd,3'bkwd @ 2.0    Physical Therapy Assessment and Plan PT Assessment and Plan Clinical Impression Statement: Improving posture and technique with therex.  continues to require therapist facilitation with w-backs and cervical retractions for form and decreasing postural substitution.  Added POE cervical movements with noted limitations with lateral flexion. PT Plan: Continue to progress exercises.       Problem List Patient Active Problem List   Diagnosis Date Noted  . Stiffness of left shoulder joint 07/19/2013  . Hyperlipidemia  07/06/2013  . HTN (hypertension) 07/06/2013  . Cervical spondylosis without myelopathy 02/13/2013  . Degeneration of cervical intervertebral disc 02/13/2013  . Spinal stenosis in cervical region 02/13/2013    PT - End of Session Activity Tolerance: Patient tolerated treatment well General Behavior During Therapy: [facility] for tasks assessed/performed   Teena Irani, PTA/CLT 08/01/2013, 10:57 AM

## 2013-08-03 ENCOUNTER — Ambulatory Visit (HOSPITAL_COMMUNITY)
Admission: RE | Admit: 2013-08-03 | Discharge: 2013-08-03 | Disposition: A | Discharge status: Home / Self Care | Payer: Medicare HMO | Source: Ambulatory Visit | Attending: Orthopedic Surgery | Admitting: Orthopedic Surgery

## 2013-08-03 NOTE — Progress Notes (Signed)
Physical Therapy Treatment Patient Details  Name: Douglas Keller MRN: [id number] Date of Birth: 1941/02/11  Today's Date: 08/03/2013 Time: 1015-1055 PT Time Calculation (min): 40 min  Visit#: 4 of 10  Re-eval: 08/18/13 Authorization: Medicare  Authorization Visit#: 4 of 10  Charges:  therex 38'  Subjective: Symptoms/Limitations Symptoms: Pt reports continues to stay pain free.  Feels like the exercises are really helping. Pain Assessment Currently in Pain?: No/denies   Exercise/Treatments Supine Flexion: 15 reps Flexion Limitations: 3# dowel Other Supine Exercises: Star gazers x15 Other Supine Exercises: serratus punches 3# dowel 10 reps Seated Other Seated Exercises: Cervical Retraction: x15 w/PT facilaition; wbacks 15 reps Prone  Retraction: 15 reps Flexion: Right;Left;5 reps Extension: Both;Limitations;15 reps Extension Limitations: palms down Horizontal ABduction 1: Both;15 reps Other Prone Exercises: wbacks modified with hands on mat (angel wings) 10 reps Other Prone Exercises: POE protraction/retraction 15 reps, cervical lateral flexion 10 reps each, cervical rotation 10 reps each Standing Extension: 15 reps Theraband Level (Shoulder Extension): Level 3 (Green) Row: 15 reps Theraband Level (Shoulder Row): Level 3 (Green) Retraction: 15 reps Theraband Level (Shoulder Retraction): Level 3 (Green) Other Standing Exercises: Facing wall: shoulder flexion with serratus anterior lift offs 2X15 reps each  Other Standing Exercises: back against wall: angel wings 15x 5", B UE flexion 15X5" ROM / Strengthening / Isometric Strengthening UBE (Upper Arm Bike): 6', 3'fwd,3'bkwd @ 2.0      Physical Therapy Assessment and Plan PT Assessment and Plan Clinical Impression Statement: Progressed reps and added prone single UE flexion and modified w-backs due to weakness.  Pt continues to progress well with improving strength and form. PT Plan: continue to increase reps/difficulty  of therex to increase postural strength.     Problem List Patient Active Problem List   Diagnosis Date Noted  . Stiffness of left shoulder joint 07/19/2013  . Hyperlipidemia 07/06/2013  . HTN (hypertension) 07/06/2013  . Cervical spondylosis without myelopathy 02/13/2013  . Degeneration of cervical intervertebral disc 02/13/2013  . Spinal stenosis in cervical region 02/13/2013    PT - End of Session Activity Tolerance: Patient tolerated treatment well General Behavior During Therapy: [facility] for tasks assessed/performed   Teena Irani, PTA/CLT 08/03/2013, 10:57 AM

## 2013-08-07 ENCOUNTER — Ambulatory Visit (HOSPITAL_COMMUNITY)
Admission: RE | Admit: 2013-08-07 | Discharge: 2013-08-07 | Disposition: A | Discharge status: Home / Self Care | Payer: Medicare HMO | Source: Ambulatory Visit | Attending: Orthopedic Surgery | Admitting: Orthopedic Surgery

## 2013-08-07 DIAGNOSIS — M25612 Stiffness of left shoulder, not elsewhere classified: Secondary | ICD-10-CM

## 2013-08-07 NOTE — Progress Notes (Signed)
Physical Therapy Treatment Patient Details  Name: Salahuddin Arismendez MRN: [id number] Date of Birth: April 20, 1941  Today's Date: 08/07/2013 Time: 1018-1058 PT Time Calculation (min): 40 min ChargeTE [id number]  Visit#: 5 of 10  Re-eval: 08/18/13    Authorization: Medicare  Authorization Time Period:    Authorization Visit#: 5 of 10   Subjective: Symptoms/Limitations Symptoms: Pt reports continues to be pain free.  Hardest exercises prone on elbows.   Pain Assessment Currently in Pain?: No/denies  Objective:   Exercise/Treatments Prone  Flexion: Both;15 reps Extension: Both;Limitations;15 reps Extension Limitations: palms down Horizontal ABduction 1: Both;15 reps Other Prone Exercises: wbacks modified with hands on mat (angel wings) 10 reps Other Prone Exercises: POE protraction/retraction 15 reps, cervical lateral flexion 10 reps each, cervical rotation 10 reps each Standing Extension: 15 reps Theraband Level (Shoulder Extension): Level 3 (Green) Row: 15 reps Theraband Level (Shoulder Row): Level 3 (Green) Retraction: 15 reps Theraband Level (Shoulder Retraction): Level 3 (Green) Other Standing Exercises: Facing wall: shoulder flexion with serratus anterior lift offs 2X15 reps each  Other Standing Exercises: back against wall: angel wings, no ER 15x 5", B UE flexion against wall 15X5";  PNF D1 and D2 Bil UE 10x ROM / Strengthening / Isometric Strengthening UBE (Upper Arm Bike): 6' bkwd @ 2.0   Physical Therapy Assessment and Plan PT Assessment and Plan Clinical Impression Statement: Added PNF UE exercises to improve ROM and coordination for return to golf.  Pt with moderate cueing for form and technique.  No reports of pain through session.   PT Plan: continue to increase reps/difficulty of therex to increase postural strength.    Goals Home Exercise Program Pt/caregiver will Perform Home Exercise Program: Independently PT Short Term Goals Time to Complete Short Term  Goals: 3 weeks PT Short Term Goal 1: Pt will improve his Lt shoulder AROM flexion and abduction to 170 degrees.   PT Short Term Goal 1 - Progress: Progressing toward goal PT Short Term Goal 2: Pt will improve his serratus posterior strength to 3+/5 to start with prone exercises to improve scapular and cervical strength.  PT Short Term Goal 2 - Progress: Progressing toward goal PT Short Term Goal 3: Pt will improve his cervical AROM by 2 cm in each direction (or by 10%) for greater ease when driving. PT Short Term Goal 3 - Progress: Progressing toward goal PT Long Term Goals Time to Complete Long Term Goals:  (6 weeks) PT Long Term Goal 1: Pt will improve his shoulder Lt shoulder AROM to WNL in order to have greater rotation when swinging a golf club.  PT Long Term Goal 2: Pt will improve his cervical AROM by 50% in order to have greater ease with swinging a golf club.  Long Term Goal 3: Pt will improve his scapular and cervical musculature strength to [facility] in order to sit with approprirate posture with min cueing x15 minutes to decrease risk of secondary injury.  Long Term Goal 4: Pt will improve his FOTO to limitation less than 40% for improved percieved functional ability.   Problem List Patient Active Problem List   Diagnosis Date Noted  . Stiffness of left shoulder joint 07/19/2013  . Hyperlipidemia 07/06/2013  . HTN (hypertension) 07/06/2013  . Cervical spondylosis without myelopathy 02/13/2013  . Degeneration of cervical intervertebral disc 02/13/2013  . Spinal stenosis in cervical region 02/13/2013    PT - End of Session Activity Tolerance: Patient tolerated treatment well General Behavior During Therapy: [facility] for tasks assessed/performed  GP  Aldona Lento 08/07/2013, 11:00 AM

## 2013-08-09 ENCOUNTER — Ambulatory Visit (HOSPITAL_COMMUNITY): Payer: Medicare Other | Admitting: Physical Therapy

## 2013-08-10 ENCOUNTER — Ambulatory Visit (HOSPITAL_COMMUNITY)
Admission: RE | Admit: 2013-08-10 | Discharge: 2013-08-10 | Disposition: A | Discharge status: Home / Self Care | Payer: Medicare HMO | Source: Ambulatory Visit | Attending: Family Medicine | Admitting: Family Medicine

## 2013-08-10 DIAGNOSIS — M25612 Stiffness of left shoulder, not elsewhere classified: Secondary | ICD-10-CM

## 2013-08-10 NOTE — Progress Notes (Signed)
Physical Therapy Treatment Patient Details  Name: Douglas Keller MRN: [id number] Date of Birth: 10-03-1940  Today's Date: 08/10/2013 Time: 0800-0845 PT Time Calculation (min): 45 min Charges [id number]   Visit#: 7 of 10  Re-eval: 08/18/13 Assessment Diagnosis: post op cercial fusion for cervical spondylosis Surgical Date: 05/06/13 Next MD Visit: Dr. Carloyn Manner 08/10/13  Authorization: Medicare  Authorization Time Period:    Authorization Visit#: 7 of 10   Subjective: Symptoms/Limitations Symptoms: denies pain  Pain Assessment Pain Score: 0-No pain  Precautions/Restrictions     Exercise/Treatments Mobility/Balance        Supine Other Supine Exercises: serratus puncjh 3# 15 reps  Seated Other Seated Exercises: seateed AROM cervical rotation L 10x, R 10x , self overpresssure L  Other Seated Exercises: Cervical Retraction: x15 w/PT facilaition; wbacks 15 reps Prone  Flexion: Both;15 reps Extension: Both;Limitations;15 reps Extension Limitations: palms down Horizontal ABduction 1: Both;15 reps Other Prone Exercises: wbacks modified with hands on mat (angel wings) 10 reps Sidelying   Standing Extension: 20 reps Theraband Level (Shoulder Extension): Level 3 (Green) Row: 20 reps Theraband Level (Shoulder Row): Level 3 (Green) Retraction: 20 reps Theraband Level (Shoulder Retraction): Level 3 (Green) Other Standing Exercises: Facing wall: shoulder flexion with serratus anterior lift offs 2X15 reps each  Other Standing Exercises: back against wall: angel wings, no ER 15x 5", B UE flexion against wall 15X5";  PNF D1 and D2 Bil UE 10x Pulleys   Therapy Ball   ROM / Strengthening / Isometric Strengthening UBE (Upper Arm Bike): 6' bkwd @ 2.0 Wall Pushups: 10 reps   Stretches   Power Educational psychologist Flexion: 30 seconds;3 reps (B UE chest level )            Physical Therapy Assessment and Plan PT Assessment and Plan Clinical Impression Statement: no pain  during session, L UE strength improving  PT Plan: continue to increase reps/difficulty of therex to increase postural strength.    Goals Home Exercise Program Pt/caregiver will Perform Home Exercise Program: Independently PT Short Term Goals Time to Complete Short Term Goals: 3 weeks PT Short Term Goal 1: Pt will improve his Lt shoulder AROM flexion and abduction to 170 degrees.   PT Short Term Goal 2: Pt will improve his serratus posterior strength to 3+/5 to start with prone exercises to improve scapular and cervical strength.  PT Short Term Goal 3: Pt will improve his cervical AROM by 2 cm in each direction (or by 10%) for greater ease when driving. PT Long Term Goals Time to Complete Long Term Goals:  (6 weeks) PT Long Term Goal 1: Pt will improve his shoulder Lt shoulder AROM to WNL in order to have greater rotation when swinging a golf club.  PT Long Term Goal 2: Pt will improve his cervical AROM by 50% in order to have greater ease with swinging a golf club.  Long Term Goal 3: Pt will improve his scapular and cervical musculature strength to [facility] in order to sit with approprirate posture with min cueing x15 minutes to decrease risk of secondary injury.  Long Term Goal 4: Pt will improve his FOTO to limitation less than 40% for improved percieved functional ability.   Problem List Patient Active Problem List   Diagnosis Date Noted  . Stiffness of left shoulder joint 07/19/2013  . Hyperlipidemia 07/06/2013  . HTN (hypertension) 07/06/2013  . Cervical spondylosis without myelopathy 02/13/2013  . Degeneration of cervical intervertebral disc 02/13/2013  . Spinal stenosis in  cervical region 02/13/2013    PT - End of Session Activity Tolerance: Patient tolerated treatment well General Behavior During Therapy: [facility] for tasks assessed/performed PT Plan of Care PT Patient Instructions: educated in cervical rotation left self overpressure at end range   GP     Alinda Egolf 08/10/2013, 8:45 AM

## 2014-01-25 ENCOUNTER — Other Ambulatory Visit: Payer: Self-pay | Admitting: Cardiovascular Disease

## 2014-01-25 NOTE — Telephone Encounter (Signed)
Rx was sent to pharmacy electronically. 

## 2014-07-05 ENCOUNTER — Ambulatory Visit (INDEPENDENT_AMBULATORY_CARE_PROVIDER_SITE_OTHER): Payer: Medicare HMO | Admitting: Cardiovascular Disease

## 2014-07-05 ENCOUNTER — Encounter: Payer: Self-pay | Admitting: Cardiovascular Disease

## 2014-07-05 VITALS — BP 150/82 | HR 57 | Resp 16 | Ht 67.0 in | Wt 201.0 lb

## 2014-07-05 DIAGNOSIS — I10 Essential (primary) hypertension: Secondary | ICD-10-CM

## 2014-07-05 DIAGNOSIS — I2510 Atherosclerotic heart disease of native coronary artery without angina pectoris: Secondary | ICD-10-CM

## 2014-07-05 DIAGNOSIS — E785 Hyperlipidemia, unspecified: Secondary | ICD-10-CM

## 2014-07-05 HISTORY — DX: Atherosclerotic heart disease of native coronary artery without angina pectoris: I25.10

## 2014-07-05 NOTE — Patient Instructions (Signed)
Dr. Croitoru recommends that you schedule a follow-up appointment in: One Year.   

## 2014-07-05 NOTE — Progress Notes (Signed)
[name], male   DOB: Dec 03, 1940, 73 y.o.   MRN: [id number]     Reason for office visit Hyperlipidemia, hypertension, coronary atherosclerosis, aortic valve sclerosis.  Douglas Keller is now 73 years old. He has not had any cardiovascular problems in the last 12 months but continues to complain of leg pain following his cervical spine surgery. He has tried exercising at the gym but this causes too much discomfort. He has continued to walk and use the stepper.  Cardiac catheterization in 2006 showed minor coronary atherosclerosis (40% proximal LAD, 40% mid RCA) and a nuclear stress test in 2010 with normal perfusion. Echocardiography showed normal left ventricular size and systolic function with aortic valve sclerosis. By his last nuclear stress test LVEF was only 48% but he has no signs or symptoms of congestive heart failure.  He has hypertension which is usually well compensated and hyperlipidemia on a highly active statin. He had lab tests performed in October with his primary care physician and was told that the lipid profile was "okay. He has had hemoglobin A1c levels elevated in the range of diabetes mellitus but does not require medications for glycemic control. I don't have a copy of his most recent labs   No Known Allergies  Current Outpatient Prescriptions  Medication Sig Dispense Refill  . amLODipine (NORVASC) 10 MG tablet Take 1 tablet (10 mg total) by mouth daily. 30 tablet 6  . aspirin EC 81 MG tablet Take 81 mg by mouth at bedtime.    . CRESTOR 20 MG tablet TAKE ONE TABLET BY MOUTH AT BEDTIME 90 tablet 1  . fish oil-omega-3 fatty acids 1000 MG capsule Take 1 g by mouth 2 (two) times daily.    Marland Kitchen GLUCOSAMINE PO Take 1 tablet by mouth at bedtime.    . hydrochlorothiazide (HYDRODIURIL) 25 MG tablet Take 1 tablet (25 mg total) by mouth daily. 30 tablet 6  . lisinopril (PRINIVIL,ZESTRIL) 40 MG tablet Take 1 tablet (40 mg total) by mouth daily. 30 tablet 6  . Multiple  Vitamin (MULITIVITAMIN WITH MINERALS) TABS Take 1 tablet by mouth daily.    . potassium chloride (KLOR-CON M10) 10 MEQ tablet Take 1 tablet (10 mEq total) by mouth daily. 30 tablet 6   No current facility-administered medications for this visit.    Past Medical History  Diagnosis Date  . Hypertension   . Hypercholesteremia   . Hyperlipidemia 07/06/2013  . HTN (hypertension) 07/06/2013  . Atherosclerosis of coronary artery without angina pectoris 07/05/2014    Past Surgical History  Procedure Laterality Date  . Knee arthroscopy    . Colonoscopy  07/26/2012    Procedure: COLONOSCOPY;  Surgeon: Jamesetta So, MD;  Location: [facility];  Service: Gastroenterology;  Laterality: N/A;    Family History  Problem Relation Age of Onset  . Colon cancer Neg Hx   . Asthma Mother   . Hypertension Father     History   Social History  . Marital Status: Widowed    Spouse Name: N/A    Number of Children: N/A  . Years of Education: N/A   Occupational History  . Not on file.   Social History Main Topics  . Smoking status: Never Smoker   . Smokeless tobacco: Not on file  . Alcohol Use: No  . Drug Use: No  . Sexual Activity: Not on file   Other Topics Concern  . Not on file   Social History Narrative    Review of systems:  The patient specifically denies any chest pain at rest or with exertion, dyspnea at rest or with exertion, orthopnea, paroxysmal nocturnal dyspnea, syncope, palpitations, focal neurological deficits, intermittent claudication, lower extremity edema, unexplained weight gain, cough, hemoptysis or wheezing.  The patient also denies abdominal pain, nausea, vomiting, dysphagia, diarrhea, constipation, polyuria, polydipsia, dysuria, hematuria, frequency, urgency, abnormal bleeding or bruising, fever, chills, unexpected weight changes, mood swings, change in skin or hair texture, change in voice quality, auditory or visual problems, allergic reactions or rashes, new  musculoskeletal complaints other than usual "aches and pains".   PHYSICAL EXAM BP 150/82 mmHg  Pulse 57  Resp 16  Ht 5\' 7"  (1.702 m)  Wt 201 lb (91.173 kg)  BMI 31.47 kg/m2  General: Alert, oriented x3, no distress Head: no evidence of trauma, PERRL, EOMI, no exophtalmos or lid lag, no myxedema, no xanthelasma; normal ears, nose and oropharynx Neck: normal jugular venous pulsations and no hepatojugular reflux; brisk carotid pulses without delay and bilateral carotid bruits (probably radiating from the chest) Chest: clear to auscultation, no signs of consolidation by percussion or palpation, normal fremitus, symmetrical and full respiratory excursions Cardiovascular: normal position and quality of the apical impulse, regular rhythm, normal first and second heart sounds, no rubs or gallops, early peaking grade 2/6 aortic ejection murmur radiating to both carotids Abdomen: no tenderness or distention, no masses by palpation, no abnormal pulsatility or arterial bruits, normal bowel sounds, no hepatosplenomegaly Extremities: no clubbing, cyanosis or edema; 2+ radial, ulnar and brachial pulses bilaterally; 2+ right femoral, posterior tibial and dorsalis pedis pulses; 2+ left femoral, posterior tibial and dorsalis pedis pulses; no subclavian or femoral bruits Neurological: grossly nonfocal   EKG: Mild sinus bradycardia, minor intraventricular conduction delay, QRS 118 ms, QTC 426 ms. A single PVC is seen  Lipid Panel  No results found for: CHOL, TRIG, HDL, CHOLHDL, VLDL, LDLCALC, LDLDIRECT The most recent labs that I find in our chart are from December 2013 and showed total cholesterol 129, HDL 37, LDL 77, triglycerides 77. BMET    Component Value Date/Time   NA 139 08/14/2011 2028   K 3.3* 08/14/2011 2028   CL 103 08/14/2011 2028   CO2 27 08/14/2011 2028   GLUCOSE 119* 08/14/2011 2028   BUN 18 08/14/2011 2028   CREATININE 1.04 08/14/2011 2028   CALCIUM 9.0 08/14/2011 2028   GFRNONAA  71* 08/14/2011 2028   GFRAA 30* 08/14/2011 2028     ASSESSMENT AND PLAN  Douglas Keller does not have any symptoms of active cardiac disease but has known coronary atherosclerosis and requires risk factor modification. Target LDL cholesterol should be preferably less than 70 mg/dL, definitely less than 100 mg/dL. His blood pressure was slightly elevated today but he leans this on "not sleeping well last night". I've asked him to keep an eye on his blood pressure lead me know if the systolic is consistently greater than 140. Will retrieve his labs from Dr. Cornelia Copa office.  He is mildly obese and I encouraged him to lose weight and exercise regular. He should call us if he should develop exertional dyspnea, exertional angina or exertional dizziness/syncope. This would lead to repeat evaluation for progression of aortic valve stenosis. Follow-up in one year.  Orders Placed This Encounter  Procedures  . EKG 12-Lead   No orders of the defined types were placed in this encounter.    Holli Humbles, MD, [place] [phone] office [phone] pager

## 2014-07-22 ENCOUNTER — Other Ambulatory Visit: Payer: Self-pay | Admitting: Cardiovascular Disease

## 2014-07-23 NOTE — Telephone Encounter (Signed)
Rx(s) sent to pharmacy electronically.  

## 2014-11-13 DIAGNOSIS — Z6831 Body mass index (BMI) 31.0-31.9, adult: Secondary | ICD-10-CM | POA: Diagnosis not present

## 2014-11-13 DIAGNOSIS — E6609 Other obesity due to excess calories: Secondary | ICD-10-CM | POA: Diagnosis not present

## 2014-11-13 DIAGNOSIS — I10 Essential (primary) hypertension: Secondary | ICD-10-CM | POA: Diagnosis not present

## 2014-11-13 DIAGNOSIS — E119 Type 2 diabetes mellitus without complications: Secondary | ICD-10-CM | POA: Diagnosis not present

## 2014-11-13 DIAGNOSIS — E782 Mixed hyperlipidemia: Secondary | ICD-10-CM | POA: Diagnosis not present

## 2014-11-13 DIAGNOSIS — G4700 Insomnia, unspecified: Secondary | ICD-10-CM | POA: Diagnosis not present

## 2015-02-26 DIAGNOSIS — E782 Mixed hyperlipidemia: Secondary | ICD-10-CM | POA: Diagnosis not present

## 2015-02-26 DIAGNOSIS — I10 Essential (primary) hypertension: Secondary | ICD-10-CM | POA: Diagnosis not present

## 2015-02-26 DIAGNOSIS — Z1389 Encounter for screening for other disorder: Secondary | ICD-10-CM | POA: Diagnosis not present

## 2015-02-26 DIAGNOSIS — Z6831 Body mass index (BMI) 31.0-31.9, adult: Secondary | ICD-10-CM | POA: Diagnosis not present

## 2015-02-26 DIAGNOSIS — E119 Type 2 diabetes mellitus without complications: Secondary | ICD-10-CM | POA: Diagnosis not present

## 2015-02-26 DIAGNOSIS — E6609 Other obesity due to excess calories: Secondary | ICD-10-CM | POA: Diagnosis not present

## 2015-03-11 ENCOUNTER — Encounter: Payer: Self-pay | Admitting: Cardiovascular Disease

## 2015-05-22 DIAGNOSIS — H8111 Benign paroxysmal vertigo, right ear: Secondary | ICD-10-CM | POA: Diagnosis not present

## 2015-05-22 DIAGNOSIS — Z6832 Body mass index (BMI) 32.0-32.9, adult: Secondary | ICD-10-CM | POA: Diagnosis not present

## 2015-05-22 DIAGNOSIS — H6121 Impacted cerumen, right ear: Secondary | ICD-10-CM | POA: Diagnosis not present

## 2015-05-22 DIAGNOSIS — Z1389 Encounter for screening for other disorder: Secondary | ICD-10-CM | POA: Diagnosis not present

## 2015-05-22 DIAGNOSIS — E6609 Other obesity due to excess calories: Secondary | ICD-10-CM | POA: Diagnosis not present

## 2015-06-11 DIAGNOSIS — H5210 Myopia, unspecified eye: Secondary | ICD-10-CM | POA: Diagnosis not present

## 2015-06-11 DIAGNOSIS — H5200 Hypermetropia, unspecified eye: Secondary | ICD-10-CM | POA: Diagnosis not present

## 2015-06-26 DIAGNOSIS — Z1389 Encounter for screening for other disorder: Secondary | ICD-10-CM | POA: Diagnosis not present

## 2015-06-26 DIAGNOSIS — Z6832 Body mass index (BMI) 32.0-32.9, adult: Secondary | ICD-10-CM | POA: Diagnosis not present

## 2015-06-26 DIAGNOSIS — Z0001 Encounter for general adult medical examination with abnormal findings: Secondary | ICD-10-CM | POA: Diagnosis not present

## 2015-06-26 DIAGNOSIS — M545 Low back pain: Secondary | ICD-10-CM | POA: Diagnosis not present

## 2015-06-26 DIAGNOSIS — Z23 Encounter for immunization: Secondary | ICD-10-CM | POA: Diagnosis not present

## 2015-06-26 DIAGNOSIS — E782 Mixed hyperlipidemia: Secondary | ICD-10-CM | POA: Diagnosis not present

## 2015-06-26 DIAGNOSIS — E119 Type 2 diabetes mellitus without complications: Secondary | ICD-10-CM | POA: Diagnosis not present

## 2015-07-03 ENCOUNTER — Ambulatory Visit: Payer: Medicare HMO | Admitting: Cardiovascular Disease

## 2015-07-08 ENCOUNTER — Ambulatory Visit: Payer: Medicare HMO | Admitting: Cardiovascular Disease

## 2015-07-08 ENCOUNTER — Telehealth: Payer: Self-pay | Admitting: Cardiovascular Disease

## 2015-07-08 NOTE — Telephone Encounter (Signed)
Pt need pt needs prior authorization for his Crestor. Please call [employer]-[phone].

## 2015-07-11 NOTE — Telephone Encounter (Signed)
Pt wants to know if his prior authorization have been done?

## 2015-07-16 ENCOUNTER — Telehealth: Payer: Self-pay

## 2015-07-16 NOTE — Telephone Encounter (Signed)
Douglas Keller is calling to get prior Auth for Crestor, Please call

## 2015-07-16 NOTE — Telephone Encounter (Signed)
Tier Exception for Crestor 20mg , Brand name, sent to [facility].

## 2015-07-16 NOTE — Telephone Encounter (Signed)
Sent to [facility]. Patient notified.

## 2015-07-17 ENCOUNTER — Telehealth: Payer: Self-pay | Admitting: Cardiovascular Disease

## 2015-07-17 ENCOUNTER — Other Ambulatory Visit: Payer: Self-pay

## 2015-07-17 MED ORDER — ROSUVASTATIN CALCIUM 20 MG PO TABS: 20.0000 mg | ORAL_TABLET | Freq: Every day | ORAL | Status: DC

## 2015-07-17 NOTE — Telephone Encounter (Signed)
Routed to [employer] who is taking care of PA for his Crestor

## 2015-07-17 NOTE — Telephone Encounter (Signed)
Douglas Keller called in stating that she receive a denial to the pt's Crestor prescription. She wanted to know if instead the pt could take the generic form of the this medication and would it have any detrimental effects. Please f/u with her  Thanks

## 2015-07-17 NOTE — Telephone Encounter (Signed)
[employer] not willing to cover Brand Name Crestor. Spoke with patient about trying Rosuvastatin to see if it is therapeutic. He is agreeable. Has appointment on 08/21/2015 with Dr. Danford Bad.

## 2015-07-17 NOTE — Telephone Encounter (Signed)
Attempted to call [employer] back. Held on the phone for a long time. Advised patient they are probably not going to cover Crestor.

## 2015-08-05 ENCOUNTER — Telehealth: Payer: Self-pay | Admitting: Cardiovascular Disease

## 2015-08-05 NOTE — Telephone Encounter (Signed)
Douglas Keller is calling because the medication Rosuvastatin (replacement for Crestor) is making him feel bad, giving him headaches , stomach aches and is hurting his muscles. Please call   Thanks

## 2015-08-05 NOTE — Telephone Encounter (Signed)
2 weeks ago changed from brand name Crestor to rosuvastatin. 2-3 days after starting developed pain in muscles, stomach aches and headaches,  Please advise

## 2015-08-06 NOTE — Telephone Encounter (Signed)
Patient agree's to take a two week break from taking rosuvastatin. Will stay off it until he OV with Dr. Sallyanne Kuster in 2 weeks

## 2015-08-06 NOTE — Telephone Encounter (Signed)
Take a 2 week break, see if symptoms resolve. If they do, restart the generic medication. If problem recurs, try adding CoQ10 300 mg daily. If problems start again, we can try appealing to his insurance.

## 2015-08-07 ENCOUNTER — Other Ambulatory Visit (HOSPITAL_COMMUNITY): Payer: Self-pay | Admitting: Internal Medicine

## 2015-08-07 ENCOUNTER — Ambulatory Visit (HOSPITAL_COMMUNITY)
Admission: RE | Admit: 2015-08-07 | Discharge: 2015-08-07 | Disposition: A | Discharge status: Home / Self Care | Payer: Commercial Managed Care - HMO | Source: Ambulatory Visit | Attending: Internal Medicine | Admitting: Internal Medicine

## 2015-08-07 DIAGNOSIS — M25552 Pain in left hip: Secondary | ICD-10-CM | POA: Insufficient documentation

## 2015-08-07 DIAGNOSIS — M5136 Other intervertebral disc degeneration, lumbar region: Secondary | ICD-10-CM | POA: Insufficient documentation

## 2015-08-07 DIAGNOSIS — M25551 Pain in right hip: Secondary | ICD-10-CM | POA: Diagnosis not present

## 2015-08-07 DIAGNOSIS — M545 Low back pain: Secondary | ICD-10-CM

## 2015-08-07 DIAGNOSIS — Z6832 Body mass index (BMI) 32.0-32.9, adult: Secondary | ICD-10-CM | POA: Diagnosis not present

## 2015-08-07 DIAGNOSIS — R0600 Dyspnea, unspecified: Secondary | ICD-10-CM

## 2015-08-07 DIAGNOSIS — E119 Type 2 diabetes mellitus without complications: Secondary | ICD-10-CM | POA: Diagnosis not present

## 2015-08-07 DIAGNOSIS — M5430 Sciatica, unspecified side: Secondary | ICD-10-CM | POA: Insufficient documentation

## 2015-08-07 DIAGNOSIS — Z1389 Encounter for screening for other disorder: Secondary | ICD-10-CM | POA: Diagnosis not present

## 2015-08-07 DIAGNOSIS — E785 Hyperlipidemia, unspecified: Secondary | ICD-10-CM | POA: Diagnosis not present

## 2015-08-13 DIAGNOSIS — H9319 Tinnitus, unspecified ear: Secondary | ICD-10-CM | POA: Diagnosis not present

## 2015-08-13 DIAGNOSIS — Z1389 Encounter for screening for other disorder: Secondary | ICD-10-CM | POA: Diagnosis not present

## 2015-08-13 DIAGNOSIS — Z6832 Body mass index (BMI) 32.0-32.9, adult: Secondary | ICD-10-CM | POA: Diagnosis not present

## 2015-08-13 DIAGNOSIS — H8110 Benign paroxysmal vertigo, unspecified ear: Secondary | ICD-10-CM | POA: Diagnosis not present

## 2015-08-21 ENCOUNTER — Encounter: Payer: Self-pay | Admitting: Cardiovascular Disease

## 2015-08-21 ENCOUNTER — Ambulatory Visit (INDEPENDENT_AMBULATORY_CARE_PROVIDER_SITE_OTHER): Payer: Commercial Managed Care - HMO | Admitting: Cardiovascular Disease

## 2015-08-21 VITALS — BP 152/86 | HR 55 | Ht 67.0 in | Wt 207.0 lb

## 2015-08-21 DIAGNOSIS — Z79899 Other long term (current) drug therapy: Secondary | ICD-10-CM

## 2015-08-21 DIAGNOSIS — I2510 Atherosclerotic heart disease of native coronary artery without angina pectoris: Secondary | ICD-10-CM

## 2015-08-21 DIAGNOSIS — E7800 Pure hypercholesterolemia, unspecified: Secondary | ICD-10-CM

## 2015-08-21 DIAGNOSIS — I10 Essential (primary) hypertension: Secondary | ICD-10-CM

## 2015-08-21 HISTORY — DX: Other long term (current) drug therapy: Z79.899

## 2015-08-21 HISTORY — DX: Pure hypercholesterolemia, unspecified: E78.00

## 2015-08-21 MED ORDER — ATORVASTATIN CALCIUM 40 MG PO TABS: 40.0000 mg | ORAL_TABLET | Freq: Every day | ORAL | Status: DC

## 2015-08-21 MED ORDER — CHLORTHALIDONE 25 MG PO TABS: 25.0000 mg | ORAL_TABLET | Freq: Every day | ORAL | Status: DC

## 2015-08-21 NOTE — Patient Instructions (Addendum)
Your physician has recommended you make the following change in your medication:   START ATORVASTATIN 40 MG TAKE  ONE TABLET WITH YOUR EVENING MEAL  START CHLORTHALIDONE 25 MG TAKE 1/2 TABLET IN THE AM   Your physician recommends that you return for lab work in: [place] (MAY 2017)  Dr. Sallyanne Kuster recommends that you schedule a follow-up appointment in: [place]

## 2015-08-21 NOTE — Progress Notes (Signed)
[name], male   DOB: 09/07/1940, 75 y.o.   MRN: [id number]    Cardiology Office Note    Date:  08/21/2015   ID:  Douglas Keller, DOB [birth year], MRN [id number]  PCP:  Purvis Kilts, MD  Cardiologist:   Sanda Klein, MD   Chief Complaint  Patient presents with  . Follow-up    no chest pain, no shortness of breath, no swelling, no cramping, some dizziness & lightheadedness    History of Present Illness:  Douglas Keller is a 75 y.o. male with hyperlipidemia, hypertension, moderate coronary atherosclerosis and a murmur due to aortic valve sclerosis without stenosis. He returns for routine follow-up. He has recovered well from the problems that he had after his cervical spine surgery and is starting to exercise again. He developed some ringing in his ears and he was advised to stop his hydrochlorothiazide. He also describes vertigo. He did fine on brand-name Crestor for a long time but when he switched to generic rosuvastatin and he developed severe widespread muscle aches. He has stopped the statin and the muscle aches have resolved.  Cardiac catheterization in 2006 showed  non obstructive therosclerosis (40% proximal LAD, 40% mid RCA) and a nuclear stress test in 2010 with normal perfusion. Echocardiography showed normal left ventricular size and systolic function with aortic valve sclerosis. By his last nuclear stress test LVEF was only 48% but he has no signs or symptoms of congestive heart failure. He has hypertension which is usually well compensated and hyperlipidemia on a highly active statin.    Past Medical History  Diagnosis Date  . Hypertension   . Hypercholesteremia   . Hyperlipidemia 07/06/2013  . HTN (hypertension) 07/06/2013  . Atherosclerosis of coronary artery without angina pectoris 07/05/2014    Past Surgical History  Procedure Laterality Date  . Knee arthroscopy    . Colonoscopy  07/26/2012    Procedure: COLONOSCOPY;  Surgeon: Jamesetta So,  MD;  [facility];  Service: Gastroenterology;  Laterality: N/A;    Outpatient Prescriptions Prior to Visit  Medication Sig Dispense Refill  . amLODipine (NORVASC) 10 MG tablet Take 1 tablet (10 mg total) by mouth daily. 30 tablet 6  . aspirin EC 81 MG tablet Take 81 mg by mouth at bedtime.    . fish oil-omega-3 fatty acids 1000 MG capsule Take 1 g by mouth 2 (two) times daily.    Marland Kitchen GLUCOSAMINE PO Take 1 tablet by mouth at bedtime.    Marland Kitchen lisinopril (PRINIVIL,ZESTRIL) 40 MG tablet Take 1 tablet (40 mg total) by mouth daily. 30 tablet 6  . Multiple Vitamin (MULITIVITAMIN WITH MINERALS) TABS Take 1 tablet by mouth daily.    . potassium chloride (KLOR-CON M10) 10 MEQ tablet Take 1 tablet (10 mEq total) by mouth daily. 30 tablet 6  . hydrochlorothiazide (HYDRODIURIL) 25 MG tablet Take 1 tablet (25 mg total) by mouth daily. 30 tablet 6  . rosuvastatin (CRESTOR) 20 MG tablet Take 1 tablet (20 mg total) by mouth daily with breakfast. 90 tablet 1   No facility-administered medications prior to visit.     Allergies:   Review of patient's allergies indicates no known allergies.   Social History   Social History  . Marital Status: Widowed    Spouse Name: N/A  . Number of Children: N/A  . Years of Education: N/A   Social History Main Topics  . Smoking status: Never Smoker   . Smokeless tobacco: None  . Alcohol Use: No  .  Drug Use: No  . Sexual Activity: Not Asked   Other Topics Concern  . None   Social History Narrative     Family History:  The patient's family history includes Asthma in his mother; Hypertension in his father. There is no history of Colon cancer.   ROS:   Please see the history of present illness.    ROS All other systems reviewed and are negative.   PHYSICAL EXAM:   VS:  BP 152/86 mmHg  Pulse 55  Ht 5\' 7"  (1.702 m)  Wt 93.895 kg (207 lb)  BMI 32.41 kg/m2   GEN: Well nourished, well developed, in no acute distress HEENT: normal Neck: no JVD,  carotid bruits, or masses Cardiac: RRR; no murmurs, rubs, or gallops,no edema  Respiratory:  clear to auscultation bilaterally, normal work of breathing GI: soft, nontender, nondistended, + BS MS: no deformity or atrophy Skin: warm and dry, no rash Neuro:  Alert and Oriented x 3, Strength and sensation are intact Psych: euthymic mood, full affect  Wt Readings from Last 3 Encounters:  08/21/15 93.895 kg (207 lb)  07/05/14 91.173 kg (201 lb)  07/06/13 89.359 kg (197 lb)      Studies/Labs Reviewed:   EKG:  EKG is ordered today.  The ekg ordered today demonstrates sinus bradycardia, otherwise normal  Recent Labs: No results found for requested labs within last 365 days.   Lipid Panel No results found for: CHOL, TRIG, HDL, CHOLHDL, VLDL, LDLCALC, LDLDIRECT   ASSESSMENT:    1. Atherosclerosis of coronary artery without angina pectoris   2. Essential hypertension   3. Hyperlipidemia   4. Hypercholesterolemia   5. Medication management      PLAN:  In order of problems listed above:  1. CAD: Remains asymptomatic 2. HTN: Elevated blood pressure following discontinuation of hydrochlorothiazide. His symptoms may be related to inner ear/acoustic nerve problems since he also has vertigo. We'll try to switch to chlorthalidone. Asked him to sinus blood pressure recordings after about 2 weeks of the new medication 3. HLP: Surprising that he developed myalgias with generic rosuvastatin, but not with brand-name Crestor. We'll try to see if he tolerates atorvastatin. If not we'll have to appeal to his insurance company to cover brand-name Crestor 4.  repeat labs on the new medications in a few months     Medication Adjustments/Labs and Tests Ordered: Current medicines are reviewed at length with the patient today.  Concerns regarding medicines are outlined above.  Medication changes, Labs and Tests ordered today are listed in the Patient Instructions below. Patient Instructions  Your  physician has recommended you make the following change in your medication:   START ATORVASTATIN 40 MG TAKE  ONE TABLET WITH YOUR EVENING MEAL  START CHLORTHALIDONE 25 MG TAKE 1/2 TABLET IN THE AM   Your physician recommends that you return for lab work in: [place] (MAY 2017)  Dr. Sallyanne Kuster recommends that you schedule a follow-up appointment in: ONE YEAR           SignedSanda Klein, MD  08/21/2015 3:14 PM    [facility] [place], [place], [place]  [address] Phone: [phone]; Fax: [phone]

## 2015-09-05 ENCOUNTER — Other Ambulatory Visit: Payer: Self-pay | Admitting: Family Medicine

## 2015-09-05 DIAGNOSIS — M5416 Radiculopathy, lumbar region: Secondary | ICD-10-CM

## 2015-09-12 ENCOUNTER — Ambulatory Visit
Admission: RE | Admit: 2015-09-12 | Discharge: 2015-09-12 | Disposition: A | Discharge status: Home / Self Care | Payer: Commercial Managed Care - HMO | Source: Ambulatory Visit | Attending: Family Medicine | Admitting: Family Medicine

## 2015-09-12 DIAGNOSIS — M4806 Spinal stenosis, lumbar region: Secondary | ICD-10-CM | POA: Diagnosis not present

## 2015-09-12 DIAGNOSIS — M5416 Radiculopathy, lumbar region: Secondary | ICD-10-CM

## 2015-11-18 DIAGNOSIS — Z79899 Other long term (current) drug therapy: Secondary | ICD-10-CM | POA: Diagnosis not present

## 2015-11-18 DIAGNOSIS — E7800 Pure hypercholesterolemia, unspecified: Secondary | ICD-10-CM | POA: Diagnosis not present

## 2015-11-19 LAB — LIPID PANEL
Cholesterol: 129 mg/dL (ref 125–200)
HDL: 38 mg/dL — ABNORMAL LOW (ref >=40)
LDL Cholesterol: 68 mg/dL (ref <130)
Total CHOL/HDL Ratio: 3.4 Ratio (ref <=5.0)
Triglycerides: 114 mg/dL (ref <150)
VLDL: 23 mg/dL (ref <30)

## 2015-11-19 LAB — COMPREHENSIVE METABOLIC PANEL
ALT: 16 U/L (ref 9–46)
AST: 18 U/L (ref 10–35)
Albumin: 4.1 g/dL (ref 3.6–5.1)
Alkaline Phosphatase: 74 U/L (ref 40–115)
BUN: 13 mg/dL (ref 7–25)
CO2: 24 mmol/L (ref 20–31)
Calcium: 9.1 mg/dL (ref 8.6–10.3)
Chloride: 104 mmol/L (ref 98–110)
Creat: 1.06 mg/dL (ref 0.70–1.18)
Glucose, Bld: 120 mg/dL — ABNORMAL HIGH (ref 65–99)
Potassium: 3.9 mmol/L (ref 3.5–5.3)
Sodium: 141 mmol/L (ref 135–146)
Total Bilirubin: 0.9 mg/dL (ref 0.2–1.2)
Total Protein: 7 g/dL (ref 6.1–8.1)

## 2015-11-27 DIAGNOSIS — M5416 Radiculopathy, lumbar region: Secondary | ICD-10-CM | POA: Diagnosis not present

## 2015-11-27 DIAGNOSIS — M4806 Spinal stenosis, lumbar region: Secondary | ICD-10-CM | POA: Diagnosis not present

## 2015-11-27 DIAGNOSIS — M545 Low back pain, unspecified: Secondary | ICD-10-CM | POA: Insufficient documentation

## 2015-11-27 DIAGNOSIS — M5441 Lumbago with sciatica, right side: Secondary | ICD-10-CM | POA: Diagnosis not present

## 2015-11-27 DIAGNOSIS — G8929 Other chronic pain: Secondary | ICD-10-CM | POA: Diagnosis not present

## 2016-01-29 DIAGNOSIS — M5412 Radiculopathy, cervical region: Secondary | ICD-10-CM | POA: Diagnosis not present

## 2016-01-29 DIAGNOSIS — M5441 Lumbago with sciatica, right side: Secondary | ICD-10-CM | POA: Diagnosis not present

## 2016-02-25 DIAGNOSIS — M5441 Lumbago with sciatica, right side: Secondary | ICD-10-CM | POA: Diagnosis not present

## 2016-02-25 DIAGNOSIS — M5412 Radiculopathy, cervical region: Secondary | ICD-10-CM | POA: Diagnosis not present

## 2016-03-24 DIAGNOSIS — M5412 Radiculopathy, cervical region: Secondary | ICD-10-CM | POA: Diagnosis not present

## 2016-03-24 DIAGNOSIS — M5441 Lumbago with sciatica, right side: Secondary | ICD-10-CM | POA: Diagnosis not present

## 2016-04-09 DIAGNOSIS — M5412 Radiculopathy, cervical region: Secondary | ICD-10-CM | POA: Diagnosis not present

## 2016-04-21 DIAGNOSIS — Z713 Dietary counseling and surveillance: Secondary | ICD-10-CM | POA: Diagnosis not present

## 2016-04-21 DIAGNOSIS — E6609 Other obesity due to excess calories: Secondary | ICD-10-CM | POA: Diagnosis not present

## 2016-04-21 DIAGNOSIS — E119 Type 2 diabetes mellitus without complications: Secondary | ICD-10-CM | POA: Diagnosis not present

## 2016-04-21 DIAGNOSIS — E669 Obesity, unspecified: Secondary | ICD-10-CM | POA: Diagnosis not present

## 2016-04-21 DIAGNOSIS — Z6831 Body mass index (BMI) 31.0-31.9, adult: Secondary | ICD-10-CM | POA: Diagnosis not present

## 2016-04-21 DIAGNOSIS — E782 Mixed hyperlipidemia: Secondary | ICD-10-CM | POA: Diagnosis not present

## 2016-04-21 DIAGNOSIS — I10 Essential (primary) hypertension: Secondary | ICD-10-CM | POA: Diagnosis not present

## 2016-04-21 DIAGNOSIS — Z1389 Encounter for screening for other disorder: Secondary | ICD-10-CM | POA: Diagnosis not present

## 2016-04-30 DIAGNOSIS — G8929 Other chronic pain: Secondary | ICD-10-CM | POA: Diagnosis not present

## 2016-04-30 DIAGNOSIS — M5416 Radiculopathy, lumbar region: Secondary | ICD-10-CM | POA: Diagnosis not present

## 2016-04-30 DIAGNOSIS — M5441 Lumbago with sciatica, right side: Secondary | ICD-10-CM | POA: Diagnosis not present

## 2016-04-30 DIAGNOSIS — M5412 Radiculopathy, cervical region: Secondary | ICD-10-CM | POA: Diagnosis not present

## 2016-05-05 DIAGNOSIS — E119 Type 2 diabetes mellitus without complications: Secondary | ICD-10-CM | POA: Diagnosis not present

## 2016-05-05 DIAGNOSIS — Z23 Encounter for immunization: Secondary | ICD-10-CM | POA: Diagnosis not present

## 2016-06-25 DIAGNOSIS — E6609 Other obesity due to excess calories: Secondary | ICD-10-CM | POA: Diagnosis not present

## 2016-06-25 DIAGNOSIS — E782 Mixed hyperlipidemia: Secondary | ICD-10-CM | POA: Diagnosis not present

## 2016-06-25 DIAGNOSIS — Z6832 Body mass index (BMI) 32.0-32.9, adult: Secondary | ICD-10-CM | POA: Diagnosis not present

## 2016-06-25 DIAGNOSIS — I2510 Atherosclerotic heart disease of native coronary artery without angina pectoris: Secondary | ICD-10-CM | POA: Diagnosis not present

## 2016-06-25 DIAGNOSIS — E119 Type 2 diabetes mellitus without complications: Secondary | ICD-10-CM | POA: Diagnosis not present

## 2016-06-25 DIAGNOSIS — Z1389 Encounter for screening for other disorder: Secondary | ICD-10-CM | POA: Diagnosis not present

## 2016-07-02 DIAGNOSIS — M5412 Radiculopathy, cervical region: Secondary | ICD-10-CM | POA: Diagnosis not present

## 2016-07-02 DIAGNOSIS — M5416 Radiculopathy, lumbar region: Secondary | ICD-10-CM | POA: Diagnosis not present

## 2016-07-09 DIAGNOSIS — H2513 Age-related nuclear cataract, bilateral: Secondary | ICD-10-CM | POA: Diagnosis not present

## 2016-07-09 DIAGNOSIS — I10 Essential (primary) hypertension: Secondary | ICD-10-CM | POA: Diagnosis not present

## 2016-07-09 DIAGNOSIS — H5200 Hypermetropia, unspecified eye: Secondary | ICD-10-CM | POA: Diagnosis not present

## 2016-07-29 DIAGNOSIS — H029 Unspecified disorder of eyelid: Secondary | ICD-10-CM | POA: Diagnosis not present

## 2016-08-06 ENCOUNTER — Encounter (HOSPITAL_COMMUNITY): Payer: Commercial Managed Care - HMO | Admitting: Physical Therapy

## 2016-08-06 ENCOUNTER — Ambulatory Visit (HOSPITAL_COMMUNITY): Payer: Commercial Managed Care - HMO | Admitting: Physical Therapy

## 2016-08-10 ENCOUNTER — Ambulatory Visit (HOSPITAL_COMMUNITY): Payer: Medicare HMO | Attending: Family Medicine | Admitting: Physical Therapy

## 2016-08-10 ENCOUNTER — Encounter (INDEPENDENT_AMBULATORY_CARE_PROVIDER_SITE_OTHER): Payer: Self-pay

## 2016-08-10 DIAGNOSIS — H8112 Benign paroxysmal vertigo, left ear: Secondary | ICD-10-CM | POA: Diagnosis not present

## 2016-08-10 DIAGNOSIS — R29898 Other symptoms and signs involving the musculoskeletal system: Secondary | ICD-10-CM | POA: Diagnosis not present

## 2016-08-10 NOTE — Therapy (Signed)
[place], [place], [address] Phone: [phone]   Fax:  [phone]  Physical Therapy Evaluation  Patient Details  Name: Douglas Keller MRN: [id number] Date of Birth: 10/25/1940 Referring Provider: Sharilyn Sites  Encounter Date: 08/10/2016      PT End of Session - 08/10/16 1207    Visit Number 1   Number of Visits 8   Date for PT Re-Evaluation 09/09/16   PT Start Time 0812   PT Stop Time 0859   PT Time Calculation (min) 47 min   Activity Tolerance Patient tolerated treatment well   Behavior During Therapy [facility] for tasks assessed/performed      Past Medical History:  Diagnosis Date  . Atherosclerosis of coronary artery without angina pectoris 07/05/2014  . HTN (hypertension) 07/06/2013  . Hypercholesteremia   . Hyperlipidemia 07/06/2013  . Hypertension     Past Surgical History:  Procedure Laterality Date  . COLONOSCOPY  07/26/2012   Procedure: COLONOSCOPY;  Surgeon: Jamesetta So, MD;  Location[facility];  Service: Gastroenterology;  Laterality: N/A;  . KNEE ARTHROSCOPY      There were no vitals filed for this visit.       Subjective Assessment - 08/10/16 0809    Subjective Douglas Keller states that he has been dizzy for over a year now.  He states that he does well throughout the day but at night when he lies down he becomes dizzy.  He states that the dizziness lasts for a half hour and then goes away.    Pertinent History cervical spondylosis with cervical fusion 04/2013.     Currently in Pain? No/denies            [facility] PT Assessment - 08/10/16 0001      Assessment   Medical Diagnosis Vertigo    Referring Provider Sharilyn Sites   Onset Date/Surgical Date 07/22/15   Next MD Visit no appointment   Prior Therapy none for this diagnosis     Precautions   Precautions None     Restrictions   Weight Bearing Restrictions No     Balance Screen   Has the patient fallen in the past 6  months No   Has the patient had a decrease in activity level because of a fear of falling?  No   Is the patient reluctant to leave their home because of a fear of falling?  No     Home Ecologist residence     Prior Function   Level of Independence Independent   Leisure golf     Cognition   Overall Cognitive Status Within Functional Limits for tasks assessed     Posture/Postural Control   Posture/Postural Control Postural limitations     ROM / Strength   AROM / PROM / Strength AROM     AROM   AROM Assessment Site Cervical   Cervical Flexion 15   Cervical Extension 10   Cervical - Right Side Bend 14   Cervical - Left Side Bend 15   Cervical - Right Rotation 45   Cervical - Left Rotation 45            Vestibular Assessment - 08/10/16 0001      Symptom Behavior   Type of Dizziness Lightheadedness   Frequency of Dizziness every night with ears ringing    Duration of Dizziness 30 minutes    Aggravating Factors Lying supine;Turning head quickly   Relieving  Factors Head stationary;Rest     Occulomotor Exam   Head shaking Horizontal Absent   Head Shaking Vertical Absent   Smooth Pursuits Saccades  Nastagmus with horizontal ane diagonal upper left lower rt   Saccades Poor trajectory;Slow     Vestibulo-Occular Reflex   VOR 1 Head Only (x 1 viewing) no difficulty   VOR 2 Head and Object (x 2 viewing) no difficulty       Other Tests   Comments Single leg stance Rt 30 Lt 52      Positional Testing   Dix-Hallpike Dix-Hallpike Right;Dix-Hallpike Left     Dix-Hallpike Right   Dix-Hallpike Right Symptoms No nystagmus     Dix-Hallpike Left   Dix-Hallpike Left Symptoms No nystagmus     Positional Sensitivities   Supine to Left Side Mild dizziness   Supine to Right Side Lightheadedness   Up from Right Hallpike Lightheadedness   Up from Left Hallpike Mild dizziness                Vestibular Treatment/Exercise - 08/10/16 0001       Vestibular Treatment/Exercise   Habituation Exercises Seated Horizontal Head Turns     Seated Horizontal Head Turns   Number of Reps  10               PT Education - 08/10/16 1204    Education provided Yes   Education Details HEP for eye motion and head movement   Person(s) Educated Patient   Methods Explanation;Handout;Demonstration   Comprehension Verbalized understanding;Returned demonstration          PT Short Term Goals - 08/10/16 1213      PT SHORT TERM GOAL #1   Title PT cervical rotation to be increased by 10 degrees both to the right and the left for safer driving    Time 2   Period Weeks   Status New     PT SHORT TERM GOAL #2   Title Pt to verbalize the importance of proper posture in both cervical health and balance.   Time 2   Period Weeks   Status New     PT SHORT TERM GOAL #3   Title PT to state that he feels that his symptoms of being dizzy have decreased 30%    Time 2   Period Weeks   Status New           PT Long Term Goals - 08/10/16 1215      PT LONG TERM GOAL #1   Title PT cervical Rotation to be improved 15 degrees bilaterally so that pt is able to see in his blind spot while driving    Time 4   Period Weeks   Status New     PT LONG TERM GOAL #2   Title Pt to be able to single leg stance bilaterally for 30 seconds for reduced risk of falling.    Time 4   Period Weeks   Status New     PT LONG TERM GOAL #3   Title Pt to state that his symptom of dizziness has decreased by 50% in both frequency and intensity   Time 4   Period Weeks   Status New               Plan - 08/10/16 1207    Clinical Impression Statement Douglas Keller is a 76 yo male who complains of dizziness and ringing in his ears when he lies down at night. He has  been referred to skilled physical therapy.  Examination demonstrates (-) [employer] and (-) roll testing.  He does have nystagmus with eye motion and quick head turns.  He has significant  limitied cervical ROM and decreased balance.   Douglas Keller will benefit from skilled PT to address these issues and maximize his functional ability.    Rehab Potential Good   PT Frequency 2x / week   PT Duration 4 weeks   PT Treatment/Interventions ADLs/Self Care Home Management;Canalith Repostioning;Therapeutic exercise;Therapeutic activities;Patient/family education;Manual techniques   PT Next Visit Plan Begin manual to cervical area to increase cervical ROM, balance activity including Single leg stance and tandem stance, complete head turns and eye motion sitting in front of a  busy background,(pediatric room).    Consulted and Agree with Plan of Care Patient      Patient will benefit from skilled therapeutic intervention in order to improve the following deficits and impairments:  Decreased balance, Decreased range of motion, Dizziness, Postural dysfunction  Visit Diagnosis: BPPV (benign paroxysmal positional vertigo), left  Other symptoms and signs involving the musculoskeletal system      G-Codes - 08-31-16 11/07/16    Functional Assessment Tool Used clinical judgement:  Pt interview   Functional Limitation Changing and maintaining body position   Changing and Maintaining Body Position Current Status [id number]) At least 40 percent but less than 60 percent impaired, limited or restricted   Changing and Maintaining Body Position Goal Status [id number]) At least 20 percent but less than 40 percent impaired, limited or restricted       Problem List Patient Active Problem List   Diagnosis Date Noted  . Hypercholesterolemia 08/21/2015  . Medication management 08/21/2015  . Atherosclerosis of coronary artery without angina pectoris 07/05/2014  . Stiffness of left shoulder joint 07/19/2013  . Hyperlipidemia 07/06/2013  . HTN (hypertension) 07/06/2013  . Cervical spondylosis without myelopathy 02/13/2013  . Degeneration of cervical intervertebral disc 02/13/2013  . Spinal stenosis in cervical  region 02/13/2013  Rayetta Humphrey, PT CLT [phone] August 31, 2016, 12:22 PM  [place], [place], [address] Phone: [phone]   Fax:  [phone]  Name: Jaycek Zubair MRN: [id number] Date of Birth: [birth year]

## 2016-08-10 NOTE — Patient Instructions (Addendum)
Flexibility: Neck Retraction    Pull head straight back, keeping eyes and jaw level. Repeat __10__ times per set. Do ___1_ sets per session. Do __2__ sessions per day.  http://orth.exer.us/344   Copyright  VHI. All rights reserved.  Scapular Retraction (Standing)    With arms at sides, pinch shoulder blades together. Repeat _10___ times per set. Do ___1_ sets per session. Do ___2_ sessions per day.  http://orth.exer.us/944   Copyright  VHI. All rights reserved.  Movements: Eyes Only (Pictorial Reference)    Therapist: Use this card with Eye Exercises 14 through 17. Move eyes back and forth x 10 reps followed by upper left/ lower right x 10 reps do 6 x a day.   Copyright  VHI. All rights reserved.  AROM: Neck Rotation    Turn head slowly to look over one shoulder, then the other. Hold each position _2___ seconds. Repeat _10___ times per set. Do __1__ sets per session. Do ___2_ sessions per day.  http://orth.exer.us/294   Copyright  VHI. All rights reserved.  AROM: Lateral Neck Flexion    Slowly tilt head toward one shoulder, then the other. Hold each position __3__ seconds. Repeat __10__ times per set. Do _1___ sets per session. Do __2__ sessions per day.  http://orth.exer.us/296   Copyright  VHI. All rights reserved.

## 2016-08-14 ENCOUNTER — Ambulatory Visit (HOSPITAL_COMMUNITY): Payer: Medicare HMO

## 2016-08-14 DIAGNOSIS — H8112 Benign paroxysmal vertigo, left ear: Secondary | ICD-10-CM

## 2016-08-14 DIAGNOSIS — R29898 Other symptoms and signs involving the musculoskeletal system: Secondary | ICD-10-CM | POA: Diagnosis not present

## 2016-08-14 NOTE — Therapy (Signed)
[place], [place], [address] Phone: [phone]   Fax:  [phone]  Physical Therapy Treatment  Patient Details  Name: Douglas Keller MRN: [id number] Date of Birth: [birth year] Referring Provider: Sharilyn Sites  Encounter Date: 08/14/2016      PT End of Session - 08/14/16 0943    Visit Number 2   Number of Visits 8   Date for PT Re-Evaluation 09/09/16   Authorization Type [employer] medicare   PT Start Time 0907   PT Stop Time 0943   PT Time Calculation (min) 36 min   Equipment Utilized During Treatment Gait belt   Activity Tolerance Patient tolerated treatment well   Behavior During Therapy [facility] for tasks assessed/performed      Past Medical History:  Diagnosis Date  . Atherosclerosis of coronary artery without angina pectoris 07/05/2014  . HTN (hypertension) 07/06/2013  . Hypercholesteremia   . Hyperlipidemia 07/06/2013  . Hypertension     Past Surgical History:  Procedure Laterality Date  . COLONOSCOPY  07/26/2012   Procedure: COLONOSCOPY;  Surgeon: Jamesetta So, MD;  [facility];  Service: Gastroenterology;  Laterality: N/A;  . KNEE ARTHROSCOPY      There were no vitals filed for this visit.      Subjective Assessment - 08/14/16 0911    Subjective Pt stated he continues to have ringing in ears at night, miminal reports dizziness.  No reoprts of current falls.  Believes ringing maybe related to medication taking at night.   Pertinent History cervical spondylosis with cervical fusion 04/2013.     Currently in Pain? No/denies            [facility] Adult PT Treatment/Exercise - 08/14/16 0001      Manual Therapy   Manual Therapy Soft tissue mobilization   Manual therapy comments Manual complete separate rest of tx- supine iwht LE elevated   Soft tissue mobilization cervical musculature, included PROM for rotation         Vestibular Treatment/Exercise - 08/14/16 0001      Vestibular Treatment/Exercise   Habituation Exercises Seated Horizontal Head Turns;Seated Vertical Head Turns;Seated Diagonal Head Turns;Standing Horizontal Head Turns;Standing Vertical Head Turns;Standing Diagonal Head Turns  standing complete in busy wall in pediatric room with NBOS   Gaze Exercises X2 Viewing Horizontal;X2 Viewing Vertical;Eye/Head Exercise Horizontal;Eye/Head Exercise Vertical  gaze exercises complete in standing NBOS            Balance Exercises - 08/14/16 0957      Balance Exercises: Standing   Standing Eyes Opened Narrow base of support (BOS);Head turns  10 reps with gaze exercises   Tandem Stance Eyes open;30 secs;3 reps   SLS Eyes open;3 reps;Time  Lt 34, Rt 13" max of 3; complete infront of mirror for postu             PT Short Term Goals - 08/10/16 1213      PT SHORT TERM GOAL #1   Title PT cervical rotation to be increased by 10 degrees both to the right and the left for safer driving    Time 2   Period Weeks   Status New     PT SHORT TERM GOAL #2   Title Pt to verbalize the importance of proper posture in both cervical health and balance.   Time 2   Period Weeks   Status New     PT SHORT TERM GOAL #3   Title PT to state that  he feels that his symptoms of being dizzy have decreased 30%    Time 2   Period Weeks   Status New           PT Long Term Goals - 08/10/16 1215      PT LONG TERM GOAL #1   Title PT cervical Rotation to be improved 15 degrees bilaterally so that pt is able to see in his blind spot while driving    Time 4   Period Weeks   Status New     PT LONG TERM GOAL #2   Title Pt to be able to single leg stance bilaterally for 30 seconds for reduced risk of falling.    Time 4   Period Weeks   Status New     PT LONG TERM GOAL #3   Title Pt to state that his symptom of dizziness has decreased by 50% in both frequency and intensity   Time 4   Period Weeks   Status New               Plan - 08/14/16 0944     Clinical Impression Statement Reviewed goals, assured correct form and technique with current HEP and copy of eval given to pt.  Began session educating importance of proper posture to assist with cervical mobility and balance.  Pt presents with decreased cervical mobilty especially rotation.  Soft tissue mobilization complete to reduce tightness with cervical musculature to improve mobility.  Session focus on improving eye strengthening with balance based activities.  Pt able to complete all exercises with min A for safety with NBOS.  Utilized visual cueing with mirror during balance activities to improve awareness of posture.  No noted nustagmus with eye motion.     Rehab Potential Good   PT Frequency 2x / week   PT Duration 4 weeks   PT Treatment/Interventions ADLs/Self Care Home Management;Canalith Repostioning;Therapeutic exercise;Therapeutic activities;Patient/family education;Manual techniques   PT Next Visit Plan Continue with manual to cervical area to increase cervical ROM, balance activity including Single leg stance and tandem stance, complete head turns and eye motion sitting in front of a  busy background,(pediatric room).       Patient will benefit from skilled therapeutic intervention in order to improve the following deficits and impairments:  Decreased balance, Decreased range of motion, Dizziness, Postural dysfunction  Visit Diagnosis: BPPV (benign paroxysmal positional vertigo), left  Other symptoms and signs involving the musculoskeletal system     Problem List Patient Active Problem List   Diagnosis Date Noted  . Hypercholesterolemia 08/21/2015  . Medication management 08/21/2015  . Atherosclerosis of coronary artery without angina pectoris 07/05/2014  . Stiffness of left shoulder joint 07/19/2013  . Hyperlipidemia 07/06/2013  . HTN (hypertension) 07/06/2013  . Cervical spondylosis without myelopathy 02/13/2013  . Degeneration of cervical intervertebral disc  02/13/2013  . Spinal stenosis in cervical region 02/13/2013   [name], [place]; [place]  Aldona Lento 08/14/2016, 9:58 AM  [place], [place], [address] Phone: [phone]   Fax:  [phone]  Name: Kendel Kross MRN: [id number] Date of Birth: [birth year]

## 2016-08-18 ENCOUNTER — Ambulatory Visit (HOSPITAL_COMMUNITY): Payer: Medicare HMO | Admitting: Physical Therapy

## 2016-08-18 DIAGNOSIS — R29898 Other symptoms and signs involving the musculoskeletal system: Secondary | ICD-10-CM | POA: Diagnosis not present

## 2016-08-18 DIAGNOSIS — H8112 Benign paroxysmal vertigo, left ear: Secondary | ICD-10-CM | POA: Diagnosis not present

## 2016-08-18 NOTE — Patient Instructions (Signed)
Bicep Curl    Standing on one leg, on floor, _2___ lb dumbbells at sides, curl arms. Repeat on other leg. Do __10__ repetitions, ___1_ sets. ADVANCED: Alternate arms (Finish moving one before starting the other.)  http://bt.exer.us/64   Copyright  VHI. All rights reserved.  Feet Heel-Toe "Tandem", Head Motion - Eyes Open    With eyes open, right foot directly in front of the other, move head slowly: side to side Repeat _10___ times per session. Do _1__ sessions per day. _ Copyright  VHI. All rights reserved.

## 2016-08-18 NOTE — Therapy (Signed)
[place], [place], [address] Phone: [phone]   Fax:  [phone]  Physical Therapy Treatment  Patient Details  Name: Douglas Keller MRN: [id number] Date of Birth: July 29, 1940 Referring Provider: Sharilyn Sites  Encounter Date: 08/18/2016      PT End of Session - 08/18/16 1029    Visit Number 3   Number of Visits 8   Date for PT Re-Evaluation 09/09/16   Authorization Type [employer] medicare   Authorization - Visit Number 3   Authorization - Number of Visits 8   PT Start Time 0910   PT Stop Time 0950   PT Time Calculation (min) 40 min   Equipment Utilized During Treatment Gait belt   Activity Tolerance Patient tolerated treatment well   Behavior During Therapy [facility] for tasks assessed/performed      Past Medical History:  Diagnosis Date  . Atherosclerosis of coronary artery without angina pectoris 07/05/2014  . HTN (hypertension) 07/06/2013  . Hypercholesteremia   . Hyperlipidemia 07/06/2013  . Hypertension     Past Surgical History:  Procedure Laterality Date  . COLONOSCOPY  07/26/2012   Procedure: COLONOSCOPY;  Surgeon: Jamesetta So, MD;  [facility];  Service: Gastroenterology;  Laterality: N/A;  . KNEE ARTHROSCOPY      There were no vitals filed for this visit.      Subjective Assessment - 08/18/16 0909    Subjective Pt states that he is feeling better. He still gets dizzy everyday but it is not as frequent and duration is not as long.  He is doing his exercises.    Pertinent History cervical spondylosis with cervical fusion 04/2013.     Currently in Pain? No/denies                Vestibular Assessment - 08/18/16 0001      Dix-Hallpike Right   Dix-Hallpike Right Symptoms No nystagmus     Dix-Hallpike Left   Dix-Hallpike Left Symptoms No nystagmus                 [facility] Adult PT Treatment/Exercise - 08/18/16 0001      Manual Therapy   Manual Therapy Soft  tissue mobilization   Manual therapy comments Manual complete separate rest of tx- supine iwht LE elevated   Soft tissue mobilization cervical musculature, included PROM for rotation         Vestibular Treatment/Exercise - 08/18/16 0001      Standing Horizontal Head Turns   Number of Reps  --  10 x done with busy background x 2 RT with NBOS     Standing Vertical Head Turns   Number of Reps  10  done in busy background x 2 with NBOS    Symptom Description  none            Balance Exercises - 08/18/16 0942      Balance Exercises: Standing   Tandem Stance Eyes open;Other reps (comment)  5 reps with head turns    SLS Eyes open;3 reps  with arm raises    Tandem Gait Forward;2 reps   Sit to Stand Time 10   Other Standing Exercises --  sitting nose to knee x 5 no change of symptoms..            PT Education - 08/18/16 1028    Education provided Yes   Education Details balance   Person(s) Educated Patient   Methods Explanation;Demonstration;Handout   Comprehension Verbalized  understanding;Returned demonstration          PT Short Term Goals - 08/18/16 1032      PT SHORT TERM GOAL #1   Title PT cervical rotation to be increased by 10 degrees both to the right and the left for safer driving    Time 2   Period Weeks   Status On-going     PT SHORT TERM GOAL #2   Title Pt to verbalize the importance of proper posture in both cervical health and balance.   Time 2   Period Weeks   Status On-going     PT SHORT TERM GOAL #3   Title PT to state that he feels that his symptoms of being dizzy have decreased 30%    Time 2   Period Weeks   Status On-going           PT Long Term Goals - 08/18/16 1032      PT LONG TERM GOAL #1   Title PT cervical Rotation to be improved 15 degrees bilaterally so that pt is able to see in his blind spot while driving    Time 4   Period Weeks   Status On-going     PT LONG TERM GOAL #2   Title Pt to be able to single leg  stance bilaterally for 30 seconds for reduced risk of falling.    Time 4   Period Weeks   Status On-going     PT LONG TERM GOAL #3   Title Pt to state that his symptom of dizziness has decreased by 50% in both frequency and intensity   Time 4   Period Weeks   Status On-going               Plan - 08/18/16 1029    Clinical Impression Statement Pt states overall he continues to improve.  Pt still has significant stiffness in his cervical area.  (-) [employer] both to the right and to the left.  Pt (-) with nose to knee testing as well.  Began balance activity with moderate assist needed with tandem stance with head turns.    Rehab Potential Good   PT Frequency 2x / week   PT Duration 4 weeks   PT Treatment/Interventions ADLs/Self Care Home Management;Canalith Repostioning;Therapeutic exercise;Therapeutic activities;Patient/family education;Manual techniques   PT Next Visit Plan Continue with manual to cervical area to increase cervical ROM, balance activity to progress to on foam.   Consulted and Agree with Plan of Care Patient      Patient will benefit from skilled therapeutic intervention in order to improve the following deficits and impairments:  Decreased balance, Decreased range of motion, Dizziness, Postural dysfunction  Visit Diagnosis: BPPV (benign paroxysmal positional vertigo), left  Other symptoms and signs involving the musculoskeletal system     Problem List Patient Active Problem List   Diagnosis Date Noted  . Hypercholesterolemia 08/21/2015  . Medication management 08/21/2015  . Atherosclerosis of coronary artery without angina pectoris 07/05/2014  . Stiffness of left shoulder joint 07/19/2013  . Hyperlipidemia 07/06/2013  . HTN (hypertension) 07/06/2013  . Cervical spondylosis without myelopathy 02/13/2013  . Degeneration of cervical intervertebral disc 02/13/2013  . Spinal stenosis in cervical region 02/13/2013   Douglas Keller, PT  CLT [phone] 08/18/2016, 10:33 AM  [place], [place], [address] Phone: [phone]   Fax:  [phone]  Name: Douglas Keller MRN: [id number] Date of Birth: 01-Aug-1940

## 2016-08-21 ENCOUNTER — Encounter: Payer: Self-pay | Admitting: Cardiovascular Disease

## 2016-08-21 ENCOUNTER — Ambulatory Visit (HOSPITAL_COMMUNITY): Payer: Medicare HMO | Attending: Family Medicine

## 2016-08-21 ENCOUNTER — Ambulatory Visit (INDEPENDENT_AMBULATORY_CARE_PROVIDER_SITE_OTHER): Payer: Medicare HMO | Admitting: Cardiovascular Disease

## 2016-08-21 VITALS — BP 142/76 | HR 54 | Ht 66.0 in | Wt 205.2 lb

## 2016-08-21 DIAGNOSIS — R29898 Other symptoms and signs involving the musculoskeletal system: Secondary | ICD-10-CM | POA: Diagnosis not present

## 2016-08-21 DIAGNOSIS — E7800 Pure hypercholesterolemia, unspecified: Secondary | ICD-10-CM

## 2016-08-21 DIAGNOSIS — I10 Essential (primary) hypertension: Secondary | ICD-10-CM

## 2016-08-21 DIAGNOSIS — H8112 Benign paroxysmal vertigo, left ear: Secondary | ICD-10-CM | POA: Diagnosis not present

## 2016-08-21 DIAGNOSIS — I2510 Atherosclerotic heart disease of native coronary artery without angina pectoris: Secondary | ICD-10-CM | POA: Diagnosis not present

## 2016-08-21 NOTE — Therapy (Signed)
[place], [place], [address] Phone: [phone]   Fax:  [phone]  Physical Therapy Treatment  Patient Details  Name: Douglas Keller MRN: [id number] Date of Birth: 24-Mar-1941 Referring Provider: Sharilyn Sites  Encounter Date: 08/21/2016      PT End of Session - 08/21/16 1227    Visit Number 4   Number of Visits 8   Date for PT Re-Evaluation 09/09/16   Authorization Type [employer] medicare   Authorization - Visit Number 4   Authorization - Number of Visits 8   PT Start Time [id number]   PT Stop Time 1157   PT Time Calculation (min) 39 min   Equipment Utilized During Treatment Gait belt   Activity Tolerance Patient tolerated treatment well   Behavior During Therapy [facility] for tasks assessed/performed      Past Medical History:  Diagnosis Date  . Atherosclerosis of coronary artery without angina pectoris 07/05/2014  . HTN (hypertension) 07/06/2013  . Hypercholesteremia   . Hyperlipidemia 07/06/2013  . Hypertension     Past Surgical History:  Procedure Laterality Date  . COLONOSCOPY  07/26/2012   Procedure: COLONOSCOPY;  Surgeon: Jamesetta So, MD;  [facility];  Service: Gastroenterology;  Laterality: N/A;  . KNEE ARTHROSCOPY      There were no vitals filed for this visit.      Subjective Assessment - 08/21/16 1119    Subjective Pt states he is doing good today, has had 2 dizzy episodes this week.  no reports of falls.  Compliant with HEP daily   Pertinent History cervical spondylosis with cervical fusion 04/2013.     Currently in Pain? No/denies                Vestibular Assessment - 08/21/16 0001      Positional Testing   Dix-Hallpike Dix-Hallpike Right;Dix-Hallpike Left     Dix-Hallpike Right   Dix-Hallpike Right Symptoms No nystagmus     Dix-Hallpike Left   Dix-Hallpike Left Symptoms No nystagmus                 [facility] Adult PT Treatment/Exercise - 08/21/16 0001      Manual Therapy   Manual Therapy Soft tissue mobilization   Manual therapy comments Manual complete separate rest of tx- supine iwht LE elevated   Soft tissue mobilization cervical musculature, included PROM for rotation         Vestibular Treatment/Exercise - 08/21/16 0001      Vestibular Treatment/Exercise   Gaze Exercises Eye/Head Exercise Horizontal;X2 Viewing Horizontal;X2 Viewing Vertical     Standing Diagonal Head Turns   Number of Reps  10   Symptiom Description  no nystagmus     X2 Viewing Horizontal   Foot Position NBOS on foam looking in busy pattern wall    Comments no nystagmus     X2 Viewing Vertical   Foot Position NBOS on foam looking in busy pattern wall   Comments no nystagmus     Eye/Head Exercise Horizontal   Foot Position NBOS on foam looking in busy pattern wall   Comments no nystagmus     Eye/Head Exercise Vertical   Foot Position NBOS on foam looking in busy pattern wall   Comments no nystagmus            Balance Exercises - 08/21/16 1234      Balance Exercises: Standing   Standing Eyes Opened Narrow base of support (BOS);Head turns;Foam/compliant surface  NBOS  with gaze/head turns on [employer]   Tandem Stance Eyes open;Foam/compliant surface;3 reps  tandem stance on [employer] with gaze/head turns   SLS Eyes open;Solid surface;5 reps  Lt 18", Rt 12"   Tandem Gait Forward;2 reps  wooden balance beam   Other Standing Exercises sitting nose to knee 5x each no symptoms             PT Short Term Goals - 08/18/16 1032      PT SHORT TERM GOAL #1   Title PT cervical rotation to be increased by 10 degrees both to the right and the left for safer driving    Time 2   Period Weeks   Status On-going     PT SHORT TERM GOAL #2   Title Pt to verbalize the importance of proper posture in both cervical health and balance.   Time 2   Period Weeks   Status On-going     PT SHORT TERM GOAL #3   Title PT to state that he feels that his symptoms of  being dizzy have decreased 30%    Time 2   Period Weeks   Status On-going           PT Long Term Goals - 08/18/16 1032      PT LONG TERM GOAL #1   Title PT cervical Rotation to be improved 15 degrees bilaterally so that pt is able to see in his blind spot while driving    Time 4   Period Weeks   Status On-going     PT LONG TERM GOAL #2   Title Pt to be able to single leg stance bilaterally for 30 seconds for reduced risk of falling.    Time 4   Period Weeks   Status On-going     PT LONG TERM GOAL #3   Title Pt to state that his symptom of dizziness has decreased by 50% in both frequency and intensity   Time 4   Period Weeks   Status On-going               Plan - 08/21/16 1227    Clinical Impression Statement Pt reported reduction in dizzy episodes and reduced intensity, reports complance with HEP daily.  Session focus on improving cervical mobility and balance training.  Did complete [employer] with (-) both right and left.  Progressed balance activities to dynamic surface with min to moderate assistance needed for safety.  No reports of pain or dizziness through session.     Rehab Potential Good   PT Frequency 2x / week   PT Duration 4 weeks   PT Treatment/Interventions ADLs/Self Care Home Management;Canalith Repostioning;Therapeutic exercise;Therapeutic activities;Patient/family education;Manual techniques   PT Next Visit Plan Continue with manual to cervical area to increase cervical ROM, balance activity to progress to on foam.      Patient will benefit from skilled therapeutic intervention in order to improve the following deficits and impairments:  Decreased balance, Decreased range of motion, Dizziness, Postural dysfunction  Visit Diagnosis: BPPV (benign paroxysmal positional vertigo), left  Other symptoms and signs involving the musculoskeletal system     Problem List Patient Active Problem List   Diagnosis Date Noted  . Hypercholesterolemia  08/21/2015  . Medication management 08/21/2015  . Atherosclerosis of coronary artery without angina pectoris 07/05/2014  . Stiffness of left shoulder joint 07/19/2013  . Hyperlipidemia 07/06/2013  . HTN (hypertension) 07/06/2013  . Cervical spondylosis without myelopathy 02/13/2013  . Degeneration of cervical  intervertebral disc 02/13/2013  . Spinal stenosis in cervical region 02/13/2013   [name], [place]; [place]  Aldona Lento 08/21/2016, 12:37 PM  [place], [place], [address] Phone: [phone]   Fax:  [phone]  Name: Douglas Keller MRN: [id number] Date of Birth: 24-Jan-1941

## 2016-08-21 NOTE — Progress Notes (Signed)
[name], male   DOB: [birth year], 76 y.o.   MRN: [id number]    Cardiology Office Note    Date:  08/21/2016   ID:  Douglas Keller, DOB [birth year], MRN [id number]  PCP:  Purvis Kilts, MD  Cardiologist:   Sanda Klein, MD   Chief Complaint  Patient presents with  . Follow-up    patient reports no problems sincxe last visit    History of Present Illness:  Douglas Keller is a 76 y.o. male with hyperlipidemia, hypertension, moderate coronary atherosclerosis and a murmur due to aortic valve sclerosis without stenosis. He still has vertigo and goes to vestibular PT. He has back pain and has required injections. He is less active and has gained weight. He has not had angina, dyspnea, syncope, palpitations, edema, claudication or cortical neurological complaints. His glycemic control is mediocre (A1c 7.4%), but metformin caused severe nausea and vomiting and he had to stop it. On current dose of BP meds (he has cut chlorthalidone in half) his SBP is around 130 at home.  Cardiac catheterization in 2006 showed  non obstructive therosclerosis (40% proximal LAD, 40% mid RCA) and had nuclear stress test in 2010 with normal perfusion. Echocardiography showed normal left ventricular size and systolic function with aortic valve sclerosis. By his last nuclear stress test LVEF was only 48% but he has no signs or symptoms of congestive heart failure. He has treated hypertension and hyperlipidemia on statin.    Past Medical History:  Diagnosis Date  . Atherosclerosis of coronary artery without angina pectoris 07/05/2014  . HTN (hypertension) 07/06/2013  . Hypercholesteremia   . Hyperlipidemia 07/06/2013  . Hypertension     Past Surgical History:  Procedure Laterality Date  . COLONOSCOPY  07/26/2012   Procedure: COLONOSCOPY;  Surgeon: Jamesetta So, MD;  [facility];  Service: Gastroenterology;  Laterality: N/A;  . KNEE ARTHROSCOPY      Outpatient Medications Prior to  Visit  Medication Sig Dispense Refill  . amLODipine (NORVASC) 10 MG tablet Take 1 tablet (10 mg total) by mouth daily. 30 tablet 6  . aspirin EC 81 MG tablet Take 81 mg by mouth at bedtime.    Marland Kitchen atorvastatin (LIPITOR) 40 MG tablet Take 1 tablet (40 mg total) by mouth daily. 90 tablet 3  . chlorhexidine (PERIDEX) 0.12 % solution continuous as needed.    . chlorthalidone (HYGROTON) 25 MG tablet Take 1 tablet (25 mg total) by mouth daily. (Patient taking differently: Take 12.5 mg by mouth daily. ) 90 tablet 3  . fish oil-omega-3 fatty acids 1000 MG capsule Take 1 g by mouth 2 (two) times daily.    Marland Kitchen GLUCOSAMINE PO Take 1 tablet by mouth at bedtime.    Marland Kitchen lisinopril (PRINIVIL,ZESTRIL) 40 MG tablet Take 1 tablet (40 mg total) by mouth daily. 30 tablet 6  . meclizine (ANTIVERT) 25 MG tablet continuous as needed.    . Multiple Vitamin (MULITIVITAMIN WITH MINERALS) TABS Take 1 tablet by mouth daily.    . potassium chloride (KLOR-CON M10) 10 MEQ tablet Take 1 tablet (10 mEq total) by mouth daily. 30 tablet 6   No facility-administered medications prior to visit.      Allergies:   Patient has no known allergies.   Social History   Social History  . Marital status: Widowed    Spouse name: N/A  . Number of children: N/A  . Years of education: N/A   Social History Main Topics  . Smoking status: Never  Smoker  . Smokeless tobacco: Not on file  . Alcohol use No  . Drug use: No  . Sexual activity: Not on file   Other Topics Concern  . Not on file   Social History Narrative  . No narrative on file     Family History:  The patient's family history includes Asthma in his mother; Hypertension in his father.   ROS:   Please see the history of present illness.    ROS All other systems reviewed and are negative.   PHYSICAL EXAM:   VS:  BP (!) 142/76   Pulse (!) 54   Ht 5\' 6"  (1.676 m)   Wt 93.1 kg (205 lb 3.2 oz)   BMI 33.12 kg/m    GEN: Well nourished, well developed, in no acute  distress  HEENT: normal  Neck: no JVD, carotid bruits, or masses Cardiac: RRR; early peaking 1-2/6 aortic ejection murmur, no diastolic murmurs, rubs, or gallops,no edema  Respiratory:  clear to auscultation bilaterally, normal work of breathing GI: soft, nontender, nondistended, + BS MS: no deformity or atrophy  Skin: warm and dry, no rash Neuro:  Alert and Oriented x 3, Strength and sensation are intact Psych: euthymic mood, full affect  Wt Readings from Last 3 Encounters:  08/21/16 93.1 kg (205 lb 3.2 oz)  08/21/15 93.9 kg (207 lb)  07/05/14 91.2 kg (201 lb)      Studies/Labs Reviewed:   EKG:  EKG is ordered today.  The ekg ordered today demonstrates sinus bradycardia, otherwise normal  Recent Labs: 11/18/2015: ALT 16; BUN 13; Creat 1.06; Potassium 3.9; Sodium 141   Lipid Panel    Component Value Date/Time   CHOL 129 11/18/2015 0750   TRIG 114 11/18/2015 0750   HDL 38 (L) 11/18/2015 0750   CHOLHDL 3.4 11/18/2015 0750   VLDL 23 11/18/2015 0750   LDLCALC 68 11/18/2015 0750     ASSESSMENT:    1. Atherosclerosis of native coronary artery of native heart without angina pectoris   2. Essential hypertension   3. Hypercholesterolemia      PLAN:  In order of problems listed above:  1. CAD: Remains asymptomatic. Focus on risk factors. 2. HTN: Elevated blood pressure following discontinuation of hydrochlorothiazide. His symptoms may be related to inner ear/acoustic nerve problems since he also has vertigo. We'll try to switch to chlorthalidone. Asked him to sinus blood pressure recordings after about 2 weeks of the new medication 3. HLP: LDL at target <70. Encourage more attention to diet to avoid further weight gain, since his ability to exercise is limited.    Medication Adjustments/Labs and Tests Ordered: Current medicines are reviewed at length with the patient today.  Concerns regarding medicines are outlined above.  Medication changes, Labs and Tests ordered today  are listed in the Patient Instructions below. Patient Instructions  Dr Sallyanne Kuster recommends that you schedule a follow-up appointment in 1 year. You will receive a reminder letter in the mail two months in advance. If you don't receive a letter, please call our office to schedule the follow-up appointment.  If you need a refill on your cardiac medications before your next appointment, please call your pharmacy.      Signed, Sanda Klein, MD  08/21/2016 5:31 PM    [place], [place], [place]  [address] Phone: [phone]; Fax: [phone]

## 2016-08-21 NOTE — Patient Instructions (Signed)
Dr Croitoru recommends that you schedule a follow-up appointment in 1 year. You will receive a reminder letter in the mail two months in advance. If you don't receive a letter, please call our office to schedule the follow-up appointment.  If you need a refill on your cardiac medications before your next appointment, please call your pharmacy. 

## 2016-08-25 ENCOUNTER — Ambulatory Visit (HOSPITAL_COMMUNITY): Payer: Medicare HMO

## 2016-08-25 DIAGNOSIS — H8112 Benign paroxysmal vertigo, left ear: Secondary | ICD-10-CM | POA: Diagnosis not present

## 2016-08-25 DIAGNOSIS — R29898 Other symptoms and signs involving the musculoskeletal system: Secondary | ICD-10-CM

## 2016-08-25 NOTE — Therapy (Signed)
[place], [place], [address] Phone: [phone]   Fax:  [phone]  Physical Therapy Treatment  Patient Details  Name: Douglas Keller MRN: [id number] Date of Birth: Sep 01, 1940 Referring Provider: Sharilyn Sites  Encounter Date: 08/25/2016      PT End of Session - 08/25/16 0905    Visit Number 5   Number of Visits 8   Date for PT Re-Evaluation 09/09/16   Authorization Type [employer] medicare   Authorization - Visit Number 5   Authorization - Number of Visits 8   PT Start Time 0902   PT Stop Time 0943   PT Time Calculation (min) 41 min   Equipment Utilized During Treatment Gait belt   Activity Tolerance Patient tolerated treatment well   Behavior During Therapy [facility] for tasks assessed/performed      Past Medical History:  Diagnosis Date  . Atherosclerosis of coronary artery without angina pectoris 07/05/2014  . HTN (hypertension) 07/06/2013  . Hypercholesteremia   . Hyperlipidemia 07/06/2013  . Hypertension     Past Surgical History:  Procedure Laterality Date  . COLONOSCOPY  07/26/2012   Procedure: COLONOSCOPY;  Surgeon: Jamesetta So, MD;  [facility];  Service: Gastroenterology;  Laterality: N/A;  . KNEE ARTHROSCOPY      There were no vitals filed for this visit.      Subjective Assessment - 08/25/16 0901    Subjective Pt stated he's a little sore lower back and Bil hip, feels related to weather.  pain scale 3/10   Pertinent History cervical spondylosis with cervical fusion 04/2013.     Currently in Pain? Yes   Pain Score 3    Pain Location Back   Pain Descriptors / Indicators Sore   Pain Type Chronic pain   Pain Onset More than a month ago   Pain Frequency Intermittent   Aggravating Factors  unsure   Pain Relieving Factors walking, meds                         [facility] Adult PT Treatment/Exercise - 08/25/16 0001      Manual Therapy   Manual Therapy Soft tissue  mobilization   Manual therapy comments Manual complete separate rest of tx- supine iwht LE elevated   Soft tissue mobilization cervical musculature, included PROM for rotation             Balance Exercises - 08/25/16 0924      Balance Exercises: Standing   Tandem Stance Eyes open;Foam/compliant surface;3 reps  inside busy room with gaze exercises (eyes then head moveme)   SLS Eyes open;3 reps;Solid surface  Lt 56", Rt 18" max of 3   SLS with Vectors Solid surface;3 reps  3x 5" no HHA; cueing for posture   Rockerboard Lateral  2 min no HHA   Balance Beam Tandem gait and sidestep 2RT   Tandem Gait Forward;2 reps;Foam/compliant surface  2 sets on wooden balance beam   Numbers 1-15 Foam/compliant surface;2 reps   Other Standing Exercises standing infront of wall for posture awareness with UE flexion 2x 10             PT Short Term Goals - 08/18/16 1032      PT SHORT TERM GOAL #1   Title PT cervical rotation to be increased by 10 degrees both to the right and the left for safer driving    Time 2   Period Weeks  Status On-going     PT SHORT TERM GOAL #2   Title Pt to verbalize the importance of proper posture in both cervical health and balance.   Time 2   Period Weeks   Status On-going     PT SHORT TERM GOAL #3   Title PT to state that he feels that his symptoms of being dizzy have decreased 30%    Time 2   Period Weeks   Status On-going           PT Long Term Goals - 08/18/16 1032      PT LONG TERM GOAL #1   Title PT cervical Rotation to be improved 15 degrees bilaterally so that pt is able to see in his blind spot while driving    Time 4   Period Weeks   Status On-going     PT LONG TERM GOAL #2   Title Pt to be able to single leg stance bilaterally for 30 seconds for reduced risk of falling.    Time 4   Period Weeks   Status On-going     PT LONG TERM GOAL #3   Title Pt to state that his symptom of dizziness has decreased by 50% in both  frequency and intensity   Time 4   Period Weeks   Status On-going               Plan - 08/25/16 0946    Clinical Impression Statement Pt progressing well.  Continued session focus on balance training incorporating posture strengthening to assist with balance.  Pt making vast improvements with improved cervical mobility and improvements with SLS Bil especially with Lt LE.  Posture awareness is improving though does continue to require cueing through session.     Rehab Potential Good   PT Frequency 2x / week   PT Duration 4 weeks   PT Treatment/Interventions ADLs/Self Care Home Management;Canalith Repostioning;Therapeutic exercise;Therapeutic activities;Patient/family education;Manual techniques   PT Next Visit Plan Continue with foam, tandem stance with head turns and progress balance activities      Patient will benefit from skilled therapeutic intervention in order to improve the following deficits and impairments:  Decreased balance, Decreased range of motion, Dizziness, Postural dysfunction  Visit Diagnosis: BPPV (benign paroxysmal positional vertigo), left  Other symptoms and signs involving the musculoskeletal system     Problem List Patient Active Problem List   Diagnosis Date Noted  . Hypercholesterolemia 08/21/2015  . Medication management 08/21/2015  . Atherosclerosis of coronary artery without angina pectoris 07/05/2014  . Stiffness of left shoulder joint 07/19/2013  . Hyperlipidemia 07/06/2013  . HTN (hypertension) 07/06/2013  . Cervical spondylosis without myelopathy 02/13/2013  . Degeneration of cervical intervertebral disc 02/13/2013  . Spinal stenosis in cervical region 02/13/2013   [name], [place]; [place]  Aldona Lento 08/25/2016, 9:58 AM  [place] [address] [place], [place], [address] Phone: [phone]   Fax:  [phone]  Name: Rader Segura MRN: [id number] Date of  Birth: [birth year]

## 2016-08-28 ENCOUNTER — Ambulatory Visit (HOSPITAL_COMMUNITY): Payer: Medicare HMO

## 2016-08-28 DIAGNOSIS — H8112 Benign paroxysmal vertigo, left ear: Secondary | ICD-10-CM | POA: Diagnosis not present

## 2016-08-28 DIAGNOSIS — R29898 Other symptoms and signs involving the musculoskeletal system: Secondary | ICD-10-CM | POA: Diagnosis not present

## 2016-08-28 NOTE — Therapy (Signed)
[place], [place], [address] Phone: [phone]   Fax:  [phone]  Physical Therapy Treatment  Patient Details  Name: Douglas Keller MRN: [id number] Date of Birth: 11-Sep-1940 Referring Provider: Sharilyn Sites  Encounter Date: 08/28/2016      PT End of Session - 08/28/16 0905    Visit Number 6   Number of Visits 8   Date for PT Re-Evaluation 09/09/16   Authorization Type [employer]   Authorization - Visit Number 6   Authorization - Number of Visits 8   PT Start Time 0903   PT Stop Time 0943   PT Time Calculation (min) 40 min   Equipment Utilized During Treatment Gait belt   Activity Tolerance Patient tolerated treatment well   Behavior During Therapy [name] for tasks assessed/performed      Past Medical History:  Diagnosis Date  . Atherosclerosis of coronary artery without angina pectoris 07/05/2014  . HTN (hypertension) 07/06/2013  . Hypercholesteremia   . Hyperlipidemia 07/06/2013  . Hypertension     Past Surgical History:  Procedure Laterality Date  . COLONOSCOPY  07/26/2012   Procedure: COLONOSCOPY;  Surgeon: Jamesetta So, MD;  [facility];  Service: Gastroenterology;  Laterality: N/A;  . KNEE ARTHROSCOPY      There were no vitals filed for this visit.      Subjective Assessment - 08/28/16 0904    Subjective Pt stated the dizziness continues some days without any and other days a couple episodes daily.  No reports of pain or recent falls.     Pertinent History cervical spondylosis with cervical fusion 04/2013.     Currently in Pain? No/denies                         [facility] Adult PT Treatment/Exercise - 08/28/16 0001      Manual Therapy   Manual Therapy Soft tissue mobilization   Manual therapy comments Manual complete separate rest of tx- supine iwht LE elevated   Soft tissue mobilization cervical musculature, included PROM for rotation              Balance Exercises - 08/28/16 0942      Balance Exercises: Standing   Tandem Stance Eyes open;Foam/compliant surface;3 reps  on airex with head turns 10x each position   SLS Eyes open;3 reps;Solid surface  Lt 54", Rt 18"   SLS with Vectors Solid surface;3 reps;10 secs   Balance Beam Tandem gait and sidestep 2RT   Step Over Hurdles / Cones 6 and 12in alternating 2RT 10" holds   Other Standing Exercises standing infront of wall for posture awareness with UE flexion 2x 10             PT Short Term Goals - 08/18/16 1032      PT SHORT TERM GOAL #1   Title PT cervical rotation to be increased by 10 degrees both to the right and the left for safer driving    Time 2   Period Weeks   Status On-going     PT SHORT TERM GOAL #2   Title Pt to verbalize the importance of proper posture in both cervical health and balance.   Time 2   Period Weeks   Status On-going     PT SHORT TERM GOAL #3   Title PT to state that he feels that his symptoms of being dizzy have decreased 30%    Time  2   Period Weeks   Status On-going           PT Long Term Goals - 08/18/16 1032      PT LONG TERM GOAL #1   Title PT cervical Rotation to be improved 15 degrees bilaterally so that pt is able to see in his blind spot while driving    Time 4   Period Weeks   Status On-going     PT LONG TERM GOAL #2   Title Pt to be able to single leg stance bilaterally for 30 seconds for reduced risk of falling.    Time 4   Period Weeks   Status On-going     PT LONG TERM GOAL #3   Title Pt to state that his symptom of dizziness has decreased by 50% in both frequency and intensity   Time 4   Period Weeks   Status On-going               Plan - 08/28/16 1007    Clinical Impression Statement Pt progressing well with reports of compliance with HEP with balance activiies.  Continued session focus on balance training while incorporating education with importance of posture.  Continued wiht manual STM to  improve cervical mobility.  Increased focus with SLS based exercises with addition of hurdles 10" holds and continued vector stance with intermittent HHA.  No reports of pain through sessoin.   Rehab Potential Good   PT Frequency 2x / week   PT Duration 4 weeks   PT Treatment/Interventions ADLs/Self Care Home Management;Canalith Repostioning;Therapeutic exercise;Therapeutic activities;Patient/family education;Manual techniques   PT Next Visit Plan Continue with foam, tandem stance with head turns and progress balance activities      Patient will benefit from skilled therapeutic intervention in order to improve the following deficits and impairments:  Decreased balance, Decreased range of motion, Dizziness, Postural dysfunction  Visit Diagnosis: BPPV (benign paroxysmal positional vertigo), left  Other symptoms and signs involving the musculoskeletal system     Problem List Patient Active Problem List   Diagnosis Date Noted  . Hypercholesterolemia 08/21/2015  . Medication management 08/21/2015  . Atherosclerosis of coronary artery without angina pectoris 07/05/2014  . Stiffness of left shoulder joint 07/19/2013  . Hyperlipidemia 07/06/2013  . HTN (hypertension) 07/06/2013  . Cervical spondylosis without myelopathy 02/13/2013  . Degeneration of cervical intervertebral disc 02/13/2013  . Spinal stenosis in cervical region 02/13/2013   [name], [place]; [place]  Aldona Lento 08/28/2016, 10:31 AM  [place] [address] [place], [place], [address] Phone: [phone]   Fax:  [phone]  Name: Douglas Keller MRN: [id number] Date of Birth: 28-May-1941

## 2016-09-01 ENCOUNTER — Ambulatory Visit (HOSPITAL_COMMUNITY): Payer: Medicare HMO

## 2016-09-01 DIAGNOSIS — R29898 Other symptoms and signs involving the musculoskeletal system: Secondary | ICD-10-CM

## 2016-09-01 DIAGNOSIS — H8112 Benign paroxysmal vertigo, left ear: Secondary | ICD-10-CM

## 2016-09-01 NOTE — Therapy (Signed)
[place], [place], [address] Phone: [phone]   Fax:  [phone]  Physical Therapy Treatment  Patient Details  Name: Douglas Keller MRN: [id number] Date of Birth: 1940/11/30 Referring Provider: Sharilyn Sites  Encounter Date: 09/01/2016      PT End of Session - 09/01/16 0909    Visit Number 7   Number of Visits 8   Date for PT Re-Evaluation 09/09/16   Authorization Type [employer]   Authorization - Visit Number 7   Authorization - Number of Visits 8   PT Start Time 0904   PT Stop Time 0943   PT Time Calculation (min) 39 min      Past Medical History:  Diagnosis Date  . Atherosclerosis of coronary artery without angina pectoris 07/05/2014  . HTN (hypertension) 07/06/2013  . Hypercholesteremia   . Hyperlipidemia 07/06/2013  . Hypertension     Past Surgical History:  Procedure Laterality Date  . COLONOSCOPY  07/26/2012   Procedure: COLONOSCOPY;  Surgeon: Jamesetta So, MD;  [facility];  Service: Gastroenterology;  Laterality: N/A;  . KNEE ARTHROSCOPY      There were no vitals filed for this visit.      Subjective Assessment - 09/01/16 0908    Subjective Pt reports reduction in overall dizziness with no reports of weakness.  No reports of pain or recent falls.   Pertinent History cervical spondylosis with cervical fusion 04/2013.     Currently in Pain? No/denies                              Balance Exercises - 09/01/16 0935      Balance Exercises: Standing   Tandem Stance Eyes open;Foam/compliant surface;3 reps;30 secs  Head turns   SLS Eyes open;3 reps;Solid surface  Lt 60", Rt 32" max of 3   SLS with Vectors Solid surface;3 reps;10 secs   Balance Beam tandem gait with head turns 2RT   Gait with Head Turns Forward;Retro  DGI including gazing head turns, change speed and direction   Step Over Hurdles / Cones 6 and 12in alternating 2RT 10" holds   Other Standing Exercises Warrior I and II 2x 30"; Posture strengthening infront of wall with UE movement; cervical/scapular retraction             PT Short Term Goals - 08/18/16 1032      PT SHORT TERM GOAL #1   Title PT cervical rotation to be increased by 10 degrees both to the right and the left for safer driving    Time 2   Period Weeks   Status On-going     PT SHORT TERM GOAL #2   Title Pt to verbalize the importance of proper posture in both cervical health and balance.   Time 2   Period Weeks   Status On-going     PT SHORT TERM GOAL #3   Title PT to state that he feels that his symptoms of being dizzy have decreased 30%    Time 2   Period Weeks   Status On-going           PT Long Term Goals - 08/18/16 1032      PT LONG TERM GOAL #1   Title PT cervical Rotation to be improved 15 degrees bilaterally so that pt is able to see in his blind spot while driving    Time 4  Period Weeks   Status On-going     PT LONG TERM GOAL #2   Title Pt to be able to single leg stance bilaterally for 30 seconds for reduced risk of falling.    Time 4   Period Weeks   Status On-going     PT LONG TERM GOAL #3   Title Pt to state that his symptom of dizziness has decreased by 50% in both frequency and intensity   Time 4   Period Weeks   Status On-going               Plan - 09/01/16 [id number]    Clinical Impression Statement Pt progressing well with reports of decreased dizziness and compliancei with balance activities HEP.  Session foucs on balance training incorporating posture strengthening exercises.  Tandem stance and gait included head turns, DGI activities to improve balance with gazing and change directions and added warrior poses for core and posture strengthening.  Min A for LOB safety.  EOS pt limited by fatigue, no reports of pain through session.     Rehab Potential Good   PT Frequency 2x / week   PT Duration 4 weeks   PT Treatment/Interventions ADLs/Self Care  Home Management;Canalith Repostioning;Therapeutic exercise;Therapeutic activities;Patient/family education;Manual techniques   PT Next Visit Plan Continue with foam, tandem stance with head turns and progress balance activities      Patient will benefit from skilled therapeutic intervention in order to improve the following deficits and impairments:  Decreased balance, Decreased range of motion, Dizziness, Postural dysfunction  Visit Diagnosis: BPPV (benign paroxysmal positional vertigo), left  Other symptoms and signs involving the musculoskeletal system     Problem List Patient Active Problem List   Diagnosis Date Noted  . Hypercholesterolemia 08/21/2015  . Medication management 08/21/2015  . Atherosclerosis of coronary artery without angina pectoris 07/05/2014  . Stiffness of left shoulder joint 07/19/2013  . Hyperlipidemia 07/06/2013  . HTN (hypertension) 07/06/2013  . Cervical spondylosis without myelopathy 02/13/2013  . Degeneration of cervical intervertebral disc 02/13/2013  . Spinal stenosis in cervical region 02/13/2013   Douglas Keller, Douglas Keller; [place]  Douglas Keller 09/01/2016, 9:58 AM  [place], [place], [address] Phone: [phone]   Fax:  [phone]  Name: Douglas Keller MRN: [id number] Date of Birth: July 03, 1941

## 2016-09-04 ENCOUNTER — Ambulatory Visit (HOSPITAL_COMMUNITY): Payer: Medicare HMO | Admitting: Physical Therapy

## 2016-09-04 DIAGNOSIS — H8112 Benign paroxysmal vertigo, left ear: Secondary | ICD-10-CM | POA: Diagnosis not present

## 2016-09-04 DIAGNOSIS — R29898 Other symptoms and signs involving the musculoskeletal system: Secondary | ICD-10-CM | POA: Diagnosis not present

## 2016-09-04 NOTE — Patient Instructions (Addendum)
Knee-to-Chest Stretch: Unilateral    With hand behind right knee, pull knee in to chest until a comfortable stretch is felt in lower back and buttocks. Keep back relaxed. Hold 30____ seconds. Repeat __3__ times per set. Do ___1_ sets per session. Do _2___ sessions per day.  http://orth.exer.us/126   Copyright  VHI. All rights reserved.  Hamstring Stretch: Active    Support behind right knee. Starting with knee bent, attempt to straighten knee until a comfortable stretch is felt in back of thigh. Hold 30____ seconds. Repeat __3__ times per set. Do _1___ sets per session. Do __2__ sessions per day.  http://orth.exer.us/158   Copyright  VHI. All rights reserved.  Lumbar Rotation (Non-Weight Bearing)    Feet on floor, slowly rock knees from side to side in small, pain-free range of motion. Allow lower back to rotate slightly. Repeat __10__ times per set. Do __1__ sets per session. Do ___2_ sessions per day.  http://orth.exer.us/160   Copyright  VHI. All rights reserved.

## 2016-09-04 NOTE — Therapy (Addendum)
[place], [place], [address] Phone: [phone]   Fax:  [phone]  Physical Therapy Treatment  Patient Details  Name: Douglas Keller MRN: [id number] Date of Birth: 08-09-1940 Referring Provider: Sharilyn Sites   Encounter Date: 09/04/2016      PT End of Session - 09/04/16 0935    Visit Number 8   Number of Visits 8   Date for PT Re-Evaluation 09/09/16   Authorization Type [employer] medicare   Authorization - Visit Number 8   Authorization - Number of Visits 8   PT Start Time 0900   PT Stop Time 0945   PT Time Calculation (min) 45 min   Activity Tolerance Patient tolerated treatment well      Past Medical History:  Diagnosis Date  . Atherosclerosis of coronary artery without angina pectoris 07/05/2014  . HTN (hypertension) 07/06/2013  . Hypercholesteremia   . Hyperlipidemia 07/06/2013  . Hypertension     Past Surgical History:  Procedure Laterality Date  . COLONOSCOPY  07/26/2012   Procedure: COLONOSCOPY;  Surgeon: Jamesetta So, MD;  [facility];  Service: Gastroenterology;  Laterality: N/A;  . KNEE ARTHROSCOPY      There were no vitals filed for this visit.      Subjective Assessment - 09/04/16 0906    Subjective Pt states that last night he had a feeling like he was going to fall out, he feels very drowsy and like he is going to fall out.  The room is not spinning .     Pertinent History cervical spondylosis with cervical fusion 04/2013   Currently in Pain? No/denies            [facility] PT Assessment - 09/04/16 0001      Assessment   Medical Diagnosis Vertigo    Referring Provider Sharilyn Sites    Onset Date/Surgical Date 07/22/15   Next MD Visit no appointment   Prior Therapy none for this diagnosis     Precautions   Precautions None     Restrictions   Weight Bearing Restrictions No     Balance Screen   Has the patient fallen in the past 6 months No   Has the patient  had a decrease in activity level because of a fear of falling?  No   Is the patient reluctant to leave their home because of a fear of falling?  No     Home Ecologist residence     Prior Function   Level of Independence Independent   Leisure golf     Cognition   Overall Cognitive Status Within Functional Limits for tasks assessed     Posture/Postural Control   Posture/Postural Control Postural limitations     AROM   Cervical Flexion 20  was 15    Cervical Extension 35  was 10    Cervical - Right Side Bend 21  was 14    Cervical - Left Side Bend 22  was 15    Cervical - Right Rotation 50  was 45    Cervical - Left Rotation 50  was 45             Vestibular Assessment - 09/04/16 0001      Symptom Behavior   Type of Dizziness Lightheadedness   Frequency of Dizziness every night with ears ringing    Duration of Dizziness 30 minutes    Aggravating Factors Lying supine;Turning head quickly  Relieving Factors Head stationary;Rest     Occulomotor Exam   Head shaking Horizontal Absent   Head Shaking Vertical Absent   Smooth Pursuits Saccades  Nastagmus with horizontal ane diagonal upper left lower rt   Saccades Poor trajectory;Slow     Vestibulo-Occular Reflex   VOR 1 Head Only (x 1 viewing) no difficulty   VOR 2 Head and Object (x 2 viewing) no difficulty       Other Tests   Comments Single leg stance Rt 43  Lt 60   rt was 30 ; LTwas 52     Positional Testing   Dix-Hallpike Dix-Hallpike Right;Dix-Hallpike Left     Dix-Hallpike Right   Dix-Hallpike Right Symptoms No nystagmus     Dix-Hallpike Left   Dix-Hallpike Left Symptoms No nystagmus     Positional Sensitivities   Supine to Left Side No dizziness   Supine to Right Side No dizziness   Up from Right Hallpike No dizziness   Up from Left Hallpike No dizziness                 [facility] Adult PT Treatment/Exercise - 09/04/16 0001      Exercises   Exercises  Lumbar     Lumbar Exercises: Stretches   Active Hamstring Stretch 3 reps;30 seconds   Single Knee to Chest Stretch 1 rep;3 reps;30 seconds   Lower Trunk Rotation 5 reps   ITB Stretch Limitations 3 way Pectoral stretch B 15 seconds x 3    Piriformis Stretch Limitations gastroc stretch 30" x 3                 PT Education - 09/04/16 0934    Education provided Yes   Education Details stretches for UE and LE    Person(s) Educated Patient   Methods Explanation;Handout;Demonstration   Comprehension Verbalized understanding;Returned demonstration          PT Short Term Goals - 09/04/16 0945      PT SHORT TERM GOAL #1   Title PT cervical rotation to be increased by 10 degrees both to the right and the left for safer driving    Time 2   Period Weeks   Status Not Met  improved 5 degrees; side bend increased 10 degrees      PT SHORT TERM GOAL #2   Title Pt to verbalize the importance of proper posture in both cervical health and balance.   Time 2   Period Weeks   Status Achieved     PT SHORT TERM GOAL #3   Title PT to state that he feels that his symptoms of being dizzy have decreased 30%    Time 2   Period Weeks   Status Achieved           PT Long Term Goals - 09/04/16 0946      PT LONG TERM GOAL #1   Title PT cervical Rotation to be improved 15 degrees bilaterally so that pt is able to see in his blind spot while driving    Time 4   Period Weeks   Status Not Met     PT LONG TERM GOAL #2   Title Pt to be able to single leg stance bilaterally for 30 seconds for reduced risk of falling.    Time 4   Period Weeks   Status Achieved     PT LONG TERM GOAL #3   Title Pt to state that his symptom of dizziness has decreased by 50%  in both frequency and intensity   Time 4   Period Weeks   Status Not Met               Plan - 09/04/16 7092    Clinical Impression Statement Pt reassesssed.  He continues to have symptoms but not as severe.  He has always  been (-) for horizontal, anterior and posterior canal testing.  HIs balance has improved.     Rehab Potential Good   PT Frequency 2x / week   PT Duration 4 weeks   PT Treatment/Interventions Manual lymph drainage;Patient/family education;ADLs/Self Care Home Management;Therapeutic exercise;Balance training   PT Next Visit Plan Pt is ready for discharge.       Patient will benefit from skilled therapeutic intervention in order to improve the following deficits and impairments:     Visit Diagnosis: BPPV (benign paroxysmal positional vertigo), left  G 8982 CJ G 8983 CJ   Problem List Patient Active Problem List   Diagnosis Date Noted  . Hypercholesterolemia 08/21/2015  . Medication management 08/21/2015  . Atherosclerosis of coronary artery without angina pectoris 07/05/2014  . Stiffness of left shoulder joint 07/19/2013  . Hyperlipidemia 07/06/2013  . HTN (hypertension) 07/06/2013  . Cervical spondylosis without myelopathy 02/13/2013  . Degeneration of cervical intervertebral disc 02/13/2013  . Spinal stenosis in cervical region 02/13/2013   Rayetta Humphrey, PT CLT [phone] 09/04/2016, 9:47 AM  [place], [place], [address] Phone: [phone]   Fax:  [phone]  Name: Timoth Schara MRN: [id number] Date of Birth: Jan 28, 1941 PHYSICAL THERAPY DISCHARGE SUMMARY  Visits from Start of Care: 8  Current functional level related to goals / functional outcomes: See above    Remaining deficits: Pt still feeling, " off", does not describe what would be considered vertigo    Education / Equipment: HEP Plan: Patient agrees to discharge.  Patient goals were partially met. Patient is being discharged due to lack of progress.  ?????   Pt has improved in balance, ROM and posture but the reason that he was sent for therapy continues to be present.  Rayetta Humphrey, [place] CLT [phone]

## 2016-09-10 ENCOUNTER — Telehealth: Payer: Self-pay | Admitting: Cardiovascular Disease

## 2016-09-10 NOTE — Telephone Encounter (Signed)
New message    Pt is calling asking if he can have a stress test?

## 2016-09-10 NOTE — Telephone Encounter (Signed)
Spoke to patient . Patient states he been having no energy , would like a stress test.   RN  informed patient last office visit does not indicate a stress test is needed at present.  need an appointment to evaluate- appointment schedule for 2//26/18 with extender at 8:30 am Patient verbalized understanding

## 2016-09-13 ENCOUNTER — Emergency Department (HOSPITAL_COMMUNITY)
Admission: EM | Admit: 2016-09-13 | Discharge: 2016-09-13 | Disposition: A | Discharge status: Home / Self Care | Payer: Medicare HMO | Attending: Emergency Medicine | Admitting: Emergency Medicine

## 2016-09-13 ENCOUNTER — Encounter (HOSPITAL_COMMUNITY): Payer: Self-pay | Admitting: Emergency Medicine

## 2016-09-13 ENCOUNTER — Emergency Department (HOSPITAL_COMMUNITY): Payer: Medicare HMO

## 2016-09-13 DIAGNOSIS — M79631 Pain in right forearm: Secondary | ICD-10-CM | POA: Diagnosis not present

## 2016-09-13 DIAGNOSIS — M79601 Pain in right arm: Secondary | ICD-10-CM | POA: Diagnosis not present

## 2016-09-13 DIAGNOSIS — R0789 Other chest pain: Secondary | ICD-10-CM | POA: Diagnosis not present

## 2016-09-13 DIAGNOSIS — I2510 Atherosclerotic heart disease of native coronary artery without angina pectoris: Secondary | ICD-10-CM | POA: Diagnosis not present

## 2016-09-13 DIAGNOSIS — I10 Essential (primary) hypertension: Secondary | ICD-10-CM | POA: Diagnosis not present

## 2016-09-13 DIAGNOSIS — R935 Abnormal findings on diagnostic imaging of other abdominal regions, including retroperitoneum: Secondary | ICD-10-CM | POA: Diagnosis not present

## 2016-09-13 DIAGNOSIS — Z79899 Other long term (current) drug therapy: Secondary | ICD-10-CM | POA: Diagnosis not present

## 2016-09-13 DIAGNOSIS — R931 Abnormal findings on diagnostic imaging of heart and coronary circulation: Secondary | ICD-10-CM | POA: Insufficient documentation

## 2016-09-13 LAB — BASIC METABOLIC PANEL WITH GFR
Anion gap: 10 (ref 5–15)
BUN: 16 mg/dL (ref 6–20)
CO2: 26 mmol/L (ref 22–32)
Calcium: 9.5 mg/dL (ref 8.9–10.3)
Chloride: 108 mmol/L (ref 101–111)
Creatinine, Ser: 1.16 mg/dL (ref 0.61–1.24)
GFR calc Af Amer: >60 mL/min
GFR calc non Af Amer: >60 mL/min
Glucose, Bld: 127 mg/dL — ABNORMAL HIGH (ref 65–99)
Potassium: 3.3 mmol/L — ABNORMAL LOW (ref 3.5–5.1)
Sodium: 144 mmol/L (ref 135–145)

## 2016-09-13 LAB — CBC WITH DIFFERENTIAL/PLATELET
Basophils Absolute: 0 10*3/uL (ref 0.0–0.1)
Basophils Relative: 0 %
Eosinophils Absolute: 0.2 10*3/uL (ref 0.0–0.7)
Eosinophils Relative: 3 %
HCT: 43.4 % (ref 39.0–52.0)
Hemoglobin: 14.9 g/dL (ref 13.0–17.0)
Lymphocytes Relative: 36 %
Lymphs Abs: 3.4 10*3/uL (ref 0.7–4.0)
MCH: 30.7 pg (ref 26.0–34.0)
MCHC: 34.3 g/dL (ref 30.0–36.0)
MCV: 89.5 fL (ref 78.0–100.0)
Monocytes Absolute: 0.8 10*3/uL (ref 0.1–1.0)
Monocytes Relative: 9 %
Neutro Abs: 4.8 10*3/uL (ref 1.7–7.7)
Neutrophils Relative %: 52 %
Platelets: 255 10*3/uL (ref 150–400)
RBC: 4.85 MIL/uL (ref 4.22–5.81)
RDW: 13 % (ref 11.5–15.5)
WBC: 9.3 10*3/uL (ref 4.0–10.5)

## 2016-09-13 LAB — TROPONIN I
Troponin I: <0.03 ng/mL
Troponin I: 0.03 ng/mL
Troponin I: <0.03 ng/mL

## 2016-09-13 MED ORDER — IOPAMIDOL (ISOVUE-370) INJECTION 76%
100.0000 mL | Freq: Once | INTRAVENOUS | Status: AC | PRN
  Administered 2016-09-13: 100 mL via INTRAVENOUS

## 2016-09-13 NOTE — Progress Notes (Signed)
Cardiology Office Note   Date:  09/14/2016   ID:  Douglas Keller, DOB Jun 24, 1941, MRN [id number]  PCP:  Purvis Kilts, MD  Cardiologist:  Dr. Bertrum Sol     Chief Complaint  Patient presents with  . Dizziness      History of Present Illness: Douglas Keller is a 76 y.o. male who presents for non obstructive CAD.  Last cath 2006.  Neg nuc 2010.  hyperlipidemia, hypertension, moderate coronary atherosclerosis and a murmur due to aortic valve sclerosis without stenosis. He still has vertigo and goes to vestibular PT. He has back pain and has required injections. He is less active and has gained weight. He has not had angina, dyspnea, syncope, palpitations, edema, claudication or cortical neurological complaints. His glycemic control is mediocre (A1c 7.4%), but metformin caused severe nausea and vomiting and he had to stop it. On current dose of BP meds (he has cut chlorthalidone in half) his SBP is around 130 at home.  He was seen in ER 09/13/16 for rt arm pain.  Troponin 0.03 t  <0.03.  K+ was low at 3.3.   EKG SR no acute changes.    Today he is here for his dizziness and concern this may be cardiac related.   He has no awareness of heart rate or palpitations.  No chest pain, but he did have rt arm pain yesterday.  troponins neg.  His dizziness is lightheadedness.   More with position changes, but this has increased over last month.  May have some SOB.   CT angio of chest  IMPRESSION: 1. Negative for acute aortic dissection. No evidence for aortic aneurysm. Scattered atherosclerotic vascular disease. 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 Remaining abdominal vessels appear patent. 2. No CT evidence for acute intra-abdominal or pelvic pathology. 3. Enlarged prostate  Past Medical History:  Diagnosis Date  . Atherosclerosis of coronary artery without angina pectoris 07/05/2014  . HTN (hypertension) 07/06/2013  . Hypercholesteremia   . Hyperlipidemia  07/06/2013  . Hypertension     Past Surgical History:  Procedure Laterality Date  . CERVICAL SPINE SURGERY    . COLONOSCOPY  07/26/2012   Procedure: COLONOSCOPY;  Surgeon: Jamesetta So, MD;  [facility];  Service: Gastroenterology;  Laterality: N/A;  . KNEE ARTHROSCOPY       Current Outpatient Prescriptions  Medication Sig Dispense Refill  . amLODipine (NORVASC) 10 MG tablet Take 1 tablet (10 mg total) by mouth daily. 30 tablet 6  . aspirin EC 81 MG tablet Take 81 mg by mouth at bedtime.    Marland Kitchen atorvastatin (LIPITOR) 40 MG tablet Take 1 tablet (40 mg total) by mouth daily. 90 tablet 3  . chlorthalidone (HYGROTON) 25 MG tablet Take 1 tablet (25 mg total) by mouth daily. (Patient taking differently: Take 12.5 mg by mouth daily. ) 90 tablet 3  . fish oil-omega-3 fatty acids 1000 MG capsule Take 1 g by mouth 2 (two) times daily.    Marland Kitchen GLUCOSAMINE PO Take 1 tablet by mouth at bedtime.    Marland Kitchen lisinopril (PRINIVIL,ZESTRIL) 40 MG tablet Take 1 tablet (40 mg total) by mouth daily. 30 tablet 6  . meclizine (ANTIVERT) 25 MG tablet continuous as needed.    . Multiple Vitamin (MULITIVITAMIN WITH MINERALS) TABS Take 1 tablet by mouth daily.    . potassium chloride (KLOR-CON M10) 10 MEQ tablet Take 1 tablet (10 mEq total) by mouth daily. 30 tablet 6   No current facility-administered  medications for this visit.     Allergies:   Patient has no known allergies.    Social History:  The patient  reports that he has never smoked. He has never used smokeless tobacco. He reports that he does not drink alcohol or use drugs.   Family History:  The patient's family history includes Asthma in his mother; Hypertension in his father.    ROS:  General:no colds or fevers, no weight changes Skin:no rashes or ulcers HEENT:no blurred vision, no congestion CV:see HPI PUL:see HPI GI:no diarrhea constipation or melena, no indigestion GU:no hematuria, no dysuria MS:no joint pain, no  claudication Neuro:no syncope, no lightheadedness Endo:no diabetes, no thyroid disease  Wt Readings from Last 3 Encounters:  09/14/16 205 lb (93 kg)  09/13/16 200 lb (90.7 kg)  08/21/16 205 lb 3.2 oz (93.1 kg)     PHYSICAL EXAM: VS:  BP (!) 144/88 (BP Location: Left Arm, Patient Position: Sitting, Cuff Size: Normal)   Pulse 64   Ht 5\' 7"  (1.702 m)   Wt 205 lb (93 kg)   BMI 32.11 kg/m  , BMI Body mass index is 32.11 kg/m. General:Pleasant affect, NAD Skin:Warm and dry, brisk capillary refill HEENT:normocephalic, sclera clear, mucus membranes moist Neck:supple, no JVD, no bruits  Heart:S1S2 RRR without murmur, gallup, rub or click Lungs:clear without rales, rhonchi, or wheezes [id number], non tender, + BS, do not palpate liver spleen or masses Ext:no lower ext edema, 2+ pedal pulses, 2+ radial pulses Neuro:alert and oriented, MAE, follows commands, + facial symmetry  BP lying 128/84, sitting 133/83  Standing 129/84 and standing for 3 min 135/81.  He was a little lightheaded with sitting.   EKG:  EKG is NOT  ordered today. The ekg from ER yesterday is reviewed and no acute changes.  Recent Labs: 11/18/2015: ALT 16 09/13/2016: BUN 16; Creatinine, Ser 1.16; Hemoglobin 14.9; Platelets 255; Potassium 3.3; Sodium 144    Lipid Panel    Component Value Date/Time   CHOL 129 11/18/2015 0750   TRIG 114 11/18/2015 0750   HDL 38 (L) 11/18/2015 0750   CHOLHDL 3.4 11/18/2015 0750   VLDL 23 11/18/2015 0750   LDLCALC 68 11/18/2015 0750       Other studies Reviewed: Additional studies/ records that were reviewed today include: last nuc 2010 was without ischemia CT last pm with coronary calcification.   ASSESSMENT AND PLAN:  1. lightheadedness meclazine not much help, no awareness of arrhthymias.  Mild SOB.  With hx of nonobstrct. CAD will proceed with exercise myoview.  To eval for CAD.  Pt will follow up with Dr. Jerilynn Mages. Croitoru or APP post test.   2. HTN mildly elevated may need  increase of BP meds but will see how BP responds on stress test.  3. HLD on statin, followed by PCP   4. Hypokalemia will recheck K+ today and Mg+ and TSH and free T4   Current medicines are reviewed with the patient today.  The patient Has no concerns regarding medicines.  The following changes have been made:  See above Labs/ tests ordered today include:see above  Disposition:   FU:  see above  Signed, Cecilie Kicks, NP  09/14/2016 2:12 PM    [place], [place], [place] Phone: [phone]; Fax: [phone]

## 2016-09-13 NOTE — ED Notes (Signed)
Pt is back from radiology

## 2016-09-13 NOTE — ED Notes (Signed)
Patient transported to X-ray 

## 2016-09-13 NOTE — ED Notes (Signed)
CRITICAL VALUE ALERT  Critical value received:  Troponin 0.03  Date of notification:  09/13/2016  Time of notification:  0412  Critical value read back:Yes.    Nurse who received alert:  Charlies Silvers RN  MD notified (1st page):  Rancour  Time of first page:  0413  MD notified (2nd page):  Time of second page:  Responding MD:  Rancour  Time MD responded:  [phone]

## 2016-09-13 NOTE — ED Notes (Signed)
Patient transported to CT 

## 2016-09-13 NOTE — ED Notes (Signed)
Pt states understanding of care given and follow up instructions.  Pt a/o, ambulated from ED with steady gait  

## 2016-09-13 NOTE — ED Provider Notes (Signed)
[facility] DEPT Provider Note   CSN: [id number] Arrival date & time: 09/13/16  0032   By signing my name below, I, Eunice Blase, attest that this documentation has been prepared under the direction and in the presence of Ezequiel Essex, MD. Electronically signed, Eunice Blase, ED Scribe. 09/13/16. 1:15 AM.   History   Chief Complaint Chief Complaint  Patient presents with  . Hypertension   The history is provided by the patient and medical records. No language interpreter was used.    HPI Comments: Douglas Keller is a 76 y.o. male with Hx of HTN and HLD who presents to the Emergency Department complaining of constant right forearm pain x 9 hours. He states the pain was from the elbow down and the pain has subsided. Pt also states his pain was keeping him awake this evening. He is concerned that the problem may be d/t previously undiagnosed heart problems. He notes his blood pressure was 180/84 on the right and 140/84 when measured PTA. He further notes associated dizziness that has subsided. Pain is reportedly exacerbated when moving the arm. He notes baseline neck and shoulder pain d/t a prior surgical procedure. Pt denies injury, confusion, lightheadedness, visual changes, Hx of similar symptoms, leg swelling or pain, neck pain significant from baseline, chest pain, shoulder pain significant from baseline or back pain.  Past Medical History:  Diagnosis Date  . Atherosclerosis of coronary artery without angina pectoris 07/05/2014  . HTN (hypertension) 07/06/2013  . Hypercholesteremia   . Hyperlipidemia 07/06/2013  . Hypertension     Patient Active Problem List   Diagnosis Date Noted  . Hypercholesterolemia 08/21/2015  . Medication management 08/21/2015  . Atherosclerosis of coronary artery without angina pectoris 07/05/2014  . Stiffness of left shoulder joint 07/19/2013  . Hyperlipidemia 07/06/2013  . HTN (hypertension) 07/06/2013  . Cervical spondylosis without  myelopathy 02/13/2013  . Degeneration of cervical intervertebral disc 02/13/2013  . Spinal stenosis in cervical region 02/13/2013    Past Surgical History:  Procedure Laterality Date  . CERVICAL SPINE SURGERY    . COLONOSCOPY  07/26/2012   Procedure: COLONOSCOPY;  Surgeon: Jamesetta So, MD;  Location: [facility];  Service: Gastroenterology;  Laterality: N/A;  . KNEE ARTHROSCOPY         Home Medications    Prior to Admission medications   Medication Sig Start Date End Date Taking? Authorizing Provider  amLODipine (NORVASC) 10 MG tablet Take 1 tablet (10 mg total) by mouth daily. 12/30/12  Yes Mihai Croitoru, MD  aspirin EC 81 MG tablet Take 81 mg by mouth at bedtime.   Yes Historical Provider, MD  atorvastatin (LIPITOR) 40 MG tablet Take 1 tablet (40 mg total) by mouth daily. 08/21/15  Yes Mihai Croitoru, MD  fish oil-omega-3 fatty acids 1000 MG capsule Take 1 g by mouth 2 (two) times daily.   Yes Historical Provider, MD  GLUCOSAMINE PO Take 1 tablet by mouth at bedtime.   Yes Historical Provider, MD  lisinopril (PRINIVIL,ZESTRIL) 40 MG tablet Take 1 tablet (40 mg total) by mouth daily. 12/30/12  Yes Mihai Croitoru, MD  Multiple Vitamin (MULITIVITAMIN WITH MINERALS) TABS Take 1 tablet by mouth daily.   Yes Historical Provider, MD  potassium chloride (KLOR-CON M10) 10 MEQ tablet Take 1 tablet (10 mEq total) by mouth daily. 12/30/12  Yes Mihai Croitoru, MD  chlorhexidine (PERIDEX) 0.12 % solution continuous as needed. 08/17/15   Historical Provider, MD  chlorthalidone (HYGROTON) 25 MG tablet Take 1 tablet (25 mg total)  by mouth daily. Patient taking differently: Take 12.5 mg by mouth daily.  08/21/15   Mihai Croitoru, MD  meclizine (ANTIVERT) 25 MG tablet continuous as needed. 08/17/15   Historical Provider, MD    Family History Family History  Problem Relation Age of Onset  . Asthma Mother   . Hypertension Father   . Colon cancer Neg Hx     Social History Social History  Substance  Use Topics  . Smoking status: Never Smoker  . Smokeless tobacco: Never Used  . Alcohol use No     Allergies   Patient has no known allergies.   Review of Systems Review of Systems  All other systems reviewed and are negative.  A complete 10 system review of systems was obtained and all systems are negative except as noted in the HPI and PMH.    Physical Exam Updated Vital Signs BP (S) 156/91 (BP Location: Right Arm)   Pulse 72   Temp 98.3 F (36.8 C) (Oral)   Resp 20   Ht 5\' 7"  (1.702 m)   Wt 200 lb (90.7 kg)   SpO2 99%   BMI 31.32 kg/m   Physical Exam  Constitutional: He is oriented to person, place, and time. He appears well-developed and well-nourished. No distress.  HENT:  Head: Normocephalic and atraumatic.  Mouth/Throat: Oropharynx is clear and moist. No oropharyngeal exudate.  Eyes: Conjunctivae and EOM are normal. Pupils are equal, round, and reactive to light.  Neck: Normal range of motion. Neck supple. No JVD present.  No meningismus.  Cardiovascular: Normal rate, regular rhythm, normal heart sounds and intact distal pulses.  Exam reveals no gallop and no friction rub.   No murmur heard. Pulmonary/Chest: Effort normal and breath sounds normal. No respiratory distress. He has no wheezes.  Abdominal: Soft. He exhibits no distension. There is no tenderness. There is no rebound and no guarding.  Musculoskeletal: Normal range of motion. He exhibits no edema or tenderness.  R forearm NL; intact radial pulses and grip strength; full ROM of the elbow and shoulder; well healed cervical posterior incision  Neurological: He is alert and oriented to person, place, and time. No cranial nerve deficit. He exhibits normal muscle tone. Coordination normal.  No ataxia on finger to nose bilaterally. No pronator drift. 5/5 strength throughout. CN 2-12 intact.Equal grip strength. Sensation intact.   Skin: Skin is warm. No rash noted. No pallor.  Psychiatric: He has a normal mood  and affect. His behavior is normal.  Nursing note and vitals reviewed.    ED Treatments / Results  DIAGNOSTIC STUDIES: Oxygen Saturation is 99% on RA, normal by my interpretation.    COORDINATION OF CARE: 1:13 AM Discussed treatment plan with pt at bedside and pt agreed to plan. Will order labs and reassess.  Labs (all labs ordered are listed, but only abnormal results are displayed) Labs Reviewed  BASIC METABOLIC PANEL - Abnormal; Notable for the following:       Result Value   Potassium 3.3 (*)    Glucose, Bld 127 (*)    All other components within normal limits  TROPONIN I - Abnormal; Notable for the following:    Troponin I 0.03 (*)    All other components within normal limits  CBC WITH DIFFERENTIAL/PLATELET  TROPONIN I  TROPONIN I    EKG  EKG Interpretation  Date/Time:  Sunday September 13 2016 00:44:53 EST Ventricular Rate:  64 PR Interval:    QRS Duration: 110 QT Interval:  429 QTC  Calculation: 443 R Axis:   23 Text Interpretation:  Sinus rhythm Consider anterior infarct No significant change was found Confirmed by Wyvonnia Dusky  MD, [place] [id number]) on 09/13/2016 1:14:21 AM       Radiology Dg Chest 2 View  Result Date: 09/13/2016 CLINICAL DATA:  Increased blood pressure with right arm pain EXAM: CHEST  2 VIEW COMPARISON:  08/14/2011 FINDINGS: No acute pulmonary infiltrate or effusion. Borderline heart size. Normal mediastinal contour. No pneumothorax. Partially visualized lower cervical hardware. Degenerative changes of the spine. IMPRESSION: No active cardiopulmonary disease. Electronically Signed   By: Donavan Foil M.D.   On: 09/13/2016 02:43   Ct Angio Chest/abd/pel For Dissection W And/or Wo Contrast  Result Date: 09/13/2016 CLINICAL DATA:  Hypertension complaining of forearm pain EXAM: CT ANGIOGRAPHY CHEST, ABDOMEN AND PELVIS TECHNIQUE: Multidetector CT imaging through the chest, abdomen and pelvis was performed using the standard protocol during bolus  administration of intravenous contrast. Multiplanar reconstructed images and MIPs were obtained and reviewed to evaluate the vascular anatomy. CONTRAST:  100 mL Isovue 370 intravenous COMPARISON:  Chest x-ray 09/13/2016 FINDINGS: CTA CHEST FINDINGS Cardiovascular: Pre contrasted images demonstrate no intramural hematoma. Atherosclerotic calcifications. Coronary artery calcifications. Aorta is non aneurysmal. There is no acute dissection seen. Left-sided arch with normal 3 vessel origin. Borderline heart size. No significant pericardial effusion. Mediastinum/Nodes: No enlarged mediastinal, hilar, or axillary lymph nodes. Thyroid gland, trachea, and esophagus demonstrate no significant findings. Lungs/Pleura: Lungs are clear. No pleural effusion or pneumothorax. Musculoskeletal: No chest wall abnormality. No acute or significant osseous findings. Review of the MIP images confirms the above findings. CTA ABDOMEN AND PELVIS FINDINGS VASCULAR Aorta: Normal caliber aorta without aneurysm, dissection, vasculitis or significant stenosis. Scattered atherosclerotic calcifications. Celiac: Moderate stenosis at the origin estimated 50%. Distally the vessel is patent. SMA: Patent without evidence of aneurysm, dissection, vasculitis or significant stenosis. Renals: Both renal arteries are patent without evidence of aneurysm, dissection, vasculitis, fibromuscular dysplasia or significant stenosis. IMA: Patent without evidence of aneurysm, dissection, vasculitis or significant stenosis. Inflow: Patent without evidence of aneurysm, dissection, vasculitis or significant stenosis. Scattered atherosclerotic calcifications. Veins: No obvious venous abnormality within the limitations of this arterial phase study. Review of the MIP images confirms the above findings. NON-VASCULAR Hepatobiliary: No focal liver abnormality is seen. No gallstones, gallbladder wall thickening, or biliary dilatation. Pancreas: Unremarkable. No pancreatic  ductal dilatation or surrounding inflammatory changes. Spleen: Normal in size without focal abnormality. Adrenals/Urinary Tract: Adrenal glands are unremarkable. Kidneys are normal, without renal calculi, focal lesion, or hydronephrosis. Bladder is unremarkable. Stomach/Bowel: Stomach is within normal limits. Appendix not well identified but no right lower quadrant inflammation No evidence of bowel wall thickening, distention, or inflammatory changes. Lymphatic: No significantly enlarged lymph nodes Reproductive: Enlarged prostate Other: No free air or free fluid. Small fat containing umbilical hernia Musculoskeletal: No acute or significant osseous findings. Review of the MIP images confirms the above findings. IMPRESSION: 1. Negative for acute aortic dissection. No evidence for aortic aneurysm. Scattered atherosclerotic vascular disease. 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 Remaining abdominal vessels appear patent. 2. No CT evidence for acute intra-abdominal or pelvic pathology. 3. Enlarged prostate Electronically Signed   By: Donavan Foil M.D.   On: 09/13/2016 03:23    Procedures Procedures (including critical care time)  Medications Ordered in ED Medications - No data to display   Initial Impression / Assessment and Plan / ED Course  I have reviewed the triage vital signs and the nursing notes.  Pertinent labs & imaging results that were available during my care of the patient were reviewed by me and considered in my medical decision making (see chart for details).    Atraumatic right forearm pain that has since resolved. Have been ongoing since 5 PM. Denies any focal weakness, numbness or tingling. Denies any chest pain or shortness of breath.  No focal neurologic deficits. Distal pulses intact. EKG without acute ischemia.  Cardiac catheterization in 2006 showed  non obstructive therosclerosis (40% proximal LAD, 40% mid RCA) and had nuclear stress test in 2010 with normal  perfusion.   Troponin negative. Pain seems atypical for ACS.  CT negative for dissection or other acute pathology. BP improved in the ED.  R arm pain has resolved. May be related to previous cervical spine surgery. Doubt ACS. Equal grip strength, radial pulses, and sensation.  Minimal troponin elevation on second troponin was due to sensitivity of test. 3rd troponin is normal.  Patient is symptom free. BP is well controlled. He has an appointment with his cardiologist on 2/26.  Return precautions discussed.  Final Clinical Impressions(s) / ED Diagnoses   Final diagnoses:  Right arm pain  Hypertension, unspecified type    New Prescriptions New Prescriptions   No medications on file   I personally performed the services described in this documentation, which was scribed in my presence. The recorded information has been reviewed and is accurate.    Ezequiel Essex, MD 09/13/16 [phone]

## 2016-09-13 NOTE — ED Triage Notes (Signed)
Per pt he has been having pain in his right distal forearm today.  Noted he was having high blood pressure and wanted to make sure he was ok.  Pt states "I'm a little woozy in the head"

## 2016-09-13 NOTE — ED Notes (Signed)
Pt is back from CT

## 2016-09-13 NOTE — Discharge Instructions (Signed)
There is no evidence of heart attack. Your arm pain is likely coming from your neck issues. Follow up with your doctor as scheduled. Return to the ED if you develop chest pain, shortness of breath, or any other concerns.

## 2016-09-14 ENCOUNTER — Encounter: Payer: Self-pay | Admitting: Cardiology

## 2016-09-14 ENCOUNTER — Ambulatory Visit (INDEPENDENT_AMBULATORY_CARE_PROVIDER_SITE_OTHER): Payer: Medicare HMO | Admitting: Cardiology

## 2016-09-14 VITALS — BP 144/88 | HR 64 | Ht 67.0 in | Wt 205.0 lb

## 2016-09-14 DIAGNOSIS — I10 Essential (primary) hypertension: Secondary | ICD-10-CM

## 2016-09-14 DIAGNOSIS — E876 Hypokalemia: Secondary | ICD-10-CM

## 2016-09-14 DIAGNOSIS — R531 Weakness: Secondary | ICD-10-CM | POA: Diagnosis not present

## 2016-09-14 DIAGNOSIS — I2510 Atherosclerotic heart disease of native coronary artery without angina pectoris: Secondary | ICD-10-CM

## 2016-09-14 DIAGNOSIS — R42 Dizziness and giddiness: Secondary | ICD-10-CM

## 2016-09-14 LAB — T4, FREE: Free T4: 0.9 ng/dL (ref 0.8–1.8)

## 2016-09-14 LAB — BASIC METABOLIC PANEL WITH GFR
BUN: 13 mg/dL (ref 7–25)
CO2: 28 mmol/L (ref 20–31)
Calcium: 9.4 mg/dL (ref 8.6–10.3)
Chloride: 106 mmol/L (ref 98–110)
Creat: 1.1 mg/dL (ref 0.70–1.18)
Glucose, Bld: 125 mg/dL — ABNORMAL HIGH (ref 65–99)
Potassium: 3.6 mmol/L (ref 3.5–5.3)
Sodium: 144 mmol/L (ref 135–146)

## 2016-09-14 LAB — TSH: TSH: 1.67 mIU/L (ref 0.40–4.50)

## 2016-09-14 NOTE — Patient Instructions (Signed)
Medication Instructions:  Your physician recommends that you continue on your current medications as directed. Please refer to the Current Medication list given to you today.   Labwork: 1. TODAY BMET, MAGNESIUM LEVEL, FREE T4, TSH  Testing/Procedures: Your physician has requested that you have en exercise stress myoview. For further information please visit [email or web address]. Please follow instruction sheet, as given.    Follow-Up: DR. Sallyanne Kuster OR APP IN THE NEXT 2-3 WEEKS   Any Other Special Instructions Will Be Listed Below (If Applicable).     If you need a refill on your cardiac medications before your next appointment, please call your pharmacy.

## 2016-09-15 LAB — MAGNESIUM: Magnesium: 2.1 mg/dL (ref 1.5–2.5)

## 2016-09-23 ENCOUNTER — Telehealth (HOSPITAL_COMMUNITY): Payer: Self-pay

## 2016-09-23 NOTE — Telephone Encounter (Signed)
Encounter complete. 

## 2016-09-25 ENCOUNTER — Ambulatory Visit (HOSPITAL_COMMUNITY)
Admission: RE | Admit: 2016-09-25 | Discharge: 2016-09-25 | Disposition: A | Discharge status: Home / Self Care | Payer: Medicare HMO | Source: Ambulatory Visit | Attending: Cardiology | Admitting: Cardiology

## 2016-09-25 DIAGNOSIS — I2510 Atherosclerotic heart disease of native coronary artery without angina pectoris: Secondary | ICD-10-CM

## 2016-09-25 DIAGNOSIS — R42 Dizziness and giddiness: Secondary | ICD-10-CM | POA: Diagnosis not present

## 2016-09-25 DIAGNOSIS — Z8249 Family history of ischemic heart disease and other diseases of the circulatory system: Secondary | ICD-10-CM | POA: Diagnosis not present

## 2016-09-25 DIAGNOSIS — I10 Essential (primary) hypertension: Secondary | ICD-10-CM | POA: Diagnosis not present

## 2016-09-25 DIAGNOSIS — R0602 Shortness of breath: Secondary | ICD-10-CM | POA: Diagnosis not present

## 2016-09-25 DIAGNOSIS — R531 Weakness: Secondary | ICD-10-CM | POA: Diagnosis not present

## 2016-09-25 LAB — MYOCARDIAL PERFUSION IMAGING
Estimated workload: 8.5 METS
Exercise duration (min): 7 min
Exercise duration (sec): 0 s
LV dias vol: 114 mL (ref 62–150)
LV sys vol: 54 mL
MPHR: 146 {beats}/min
Peak HR: 137 {beats}/min
Percent HR: 93 %
RPE: 17
Rest HR: 64 {beats}/min
SDS: 0
SRS: 1
SSS: 1
TID: 0.87

## 2016-09-25 MED ORDER — TECHNETIUM TC 99M TETROFOSMIN IV KIT
32.0000 | PACK | Freq: Once | INTRAVENOUS | Status: DC | PRN
  Filled 2016-09-25: qty 32

## 2016-09-25 MED ORDER — TECHNETIUM TC 99M TETROFOSMIN IV KIT
32.0000 | PACK | Freq: Once | INTRAVENOUS | Status: AC | PRN
  Administered 2016-09-25: 32 via INTRAVENOUS
  Filled 2016-09-25: qty 32

## 2016-09-25 MED ORDER — TECHNETIUM TC 99M TETROFOSMIN IV KIT
10.4000 | PACK | Freq: Once | INTRAVENOUS | Status: AC | PRN
  Administered 2016-09-25: 10.4 via INTRAVENOUS
  Filled 2016-09-25: qty 11

## 2016-10-02 ENCOUNTER — Ambulatory Visit: Payer: Medicare HMO | Admitting: Physician Assistant

## 2016-10-05 DIAGNOSIS — M48061 Spinal stenosis, lumbar region without neurogenic claudication: Secondary | ICD-10-CM | POA: Diagnosis not present

## 2016-11-05 DIAGNOSIS — Z6832 Body mass index (BMI) 32.0-32.9, adult: Secondary | ICD-10-CM | POA: Diagnosis not present

## 2016-11-05 DIAGNOSIS — M5412 Radiculopathy, cervical region: Secondary | ICD-10-CM | POA: Diagnosis not present

## 2016-11-05 DIAGNOSIS — I10 Essential (primary) hypertension: Secondary | ICD-10-CM | POA: Diagnosis not present

## 2016-11-05 DIAGNOSIS — M48061 Spinal stenosis, lumbar region without neurogenic claudication: Secondary | ICD-10-CM | POA: Diagnosis not present

## 2016-12-03 ENCOUNTER — Ambulatory Visit (INDEPENDENT_AMBULATORY_CARE_PROVIDER_SITE_OTHER): Payer: Medicare HMO | Admitting: Otolaryngology

## 2016-12-03 DIAGNOSIS — R42 Dizziness and giddiness: Secondary | ICD-10-CM

## 2016-12-03 DIAGNOSIS — H93293 Other abnormal auditory perceptions, bilateral: Secondary | ICD-10-CM | POA: Diagnosis not present

## 2016-12-03 DIAGNOSIS — H9313 Tinnitus, bilateral: Secondary | ICD-10-CM

## 2016-12-10 DIAGNOSIS — Z1389 Encounter for screening for other disorder: Secondary | ICD-10-CM | POA: Diagnosis not present

## 2016-12-10 DIAGNOSIS — Z0001 Encounter for general adult medical examination with abnormal findings: Secondary | ICD-10-CM | POA: Diagnosis not present

## 2016-12-10 DIAGNOSIS — E782 Mixed hyperlipidemia: Secondary | ICD-10-CM | POA: Diagnosis not present

## 2016-12-10 DIAGNOSIS — Z6831 Body mass index (BMI) 31.0-31.9, adult: Secondary | ICD-10-CM | POA: Diagnosis not present

## 2016-12-10 DIAGNOSIS — I10 Essential (primary) hypertension: Secondary | ICD-10-CM | POA: Diagnosis not present

## 2016-12-10 DIAGNOSIS — I2510 Atherosclerotic heart disease of native coronary artery without angina pectoris: Secondary | ICD-10-CM | POA: Diagnosis not present

## 2017-01-22 DIAGNOSIS — E119 Type 2 diabetes mellitus without complications: Secondary | ICD-10-CM | POA: Diagnosis not present

## 2017-01-22 DIAGNOSIS — T675XXA Heat exhaustion, unspecified, initial encounter: Secondary | ICD-10-CM | POA: Diagnosis not present

## 2017-01-22 DIAGNOSIS — E669 Obesity, unspecified: Secondary | ICD-10-CM | POA: Diagnosis not present

## 2017-01-22 DIAGNOSIS — Z6831 Body mass index (BMI) 31.0-31.9, adult: Secondary | ICD-10-CM | POA: Diagnosis not present

## 2017-01-22 DIAGNOSIS — E876 Hypokalemia: Secondary | ICD-10-CM | POA: Diagnosis not present

## 2017-01-22 DIAGNOSIS — E6609 Other obesity due to excess calories: Secondary | ICD-10-CM | POA: Diagnosis not present

## 2017-01-22 DIAGNOSIS — I10 Essential (primary) hypertension: Secondary | ICD-10-CM | POA: Diagnosis not present

## 2017-01-22 DIAGNOSIS — E782 Mixed hyperlipidemia: Secondary | ICD-10-CM | POA: Diagnosis not present

## 2017-01-22 DIAGNOSIS — Z1389 Encounter for screening for other disorder: Secondary | ICD-10-CM | POA: Diagnosis not present

## 2017-01-26 DIAGNOSIS — E119 Type 2 diabetes mellitus without complications: Secondary | ICD-10-CM | POA: Diagnosis not present

## 2017-03-11 DIAGNOSIS — R42 Dizziness and giddiness: Secondary | ICD-10-CM | POA: Diagnosis not present

## 2017-03-11 DIAGNOSIS — E782 Mixed hyperlipidemia: Secondary | ICD-10-CM | POA: Diagnosis not present

## 2017-03-11 DIAGNOSIS — Z1389 Encounter for screening for other disorder: Secondary | ICD-10-CM | POA: Diagnosis not present

## 2017-03-11 DIAGNOSIS — G4700 Insomnia, unspecified: Secondary | ICD-10-CM | POA: Diagnosis not present

## 2017-03-11 DIAGNOSIS — I10 Essential (primary) hypertension: Secondary | ICD-10-CM | POA: Diagnosis not present

## 2017-03-11 DIAGNOSIS — Z6832 Body mass index (BMI) 32.0-32.9, adult: Secondary | ICD-10-CM | POA: Diagnosis not present

## 2017-04-09 ENCOUNTER — Encounter: Payer: Self-pay | Admitting: Physician Assistant

## 2017-04-09 ENCOUNTER — Ambulatory Visit (INDEPENDENT_AMBULATORY_CARE_PROVIDER_SITE_OTHER): Payer: Medicare HMO | Admitting: Physician Assistant

## 2017-04-09 VITALS — BP 140/78 | HR 52 | Ht 67.0 in | Wt 207.0 lb

## 2017-04-09 DIAGNOSIS — I2510 Atherosclerotic heart disease of native coronary artery without angina pectoris: Secondary | ICD-10-CM

## 2017-04-09 DIAGNOSIS — E785 Hyperlipidemia, unspecified: Secondary | ICD-10-CM

## 2017-04-09 DIAGNOSIS — R001 Bradycardia, unspecified: Secondary | ICD-10-CM | POA: Diagnosis not present

## 2017-04-09 DIAGNOSIS — I10 Essential (primary) hypertension: Secondary | ICD-10-CM

## 2017-04-09 DIAGNOSIS — E876 Hypokalemia: Secondary | ICD-10-CM | POA: Diagnosis not present

## 2017-04-09 LAB — LIPID PANEL
Chol/HDL Ratio: 3.1 ratio (ref 0.0–5.0)
Cholesterol, Total: 135 mg/dL (ref 100–199)
HDL: 43 mg/dL (ref >39)
LDL Calculated: 72 mg/dL (ref 0–99)
Triglycerides: 101 mg/dL (ref 0–149)
VLDL Cholesterol Cal: 20 mg/dL (ref 5–40)

## 2017-04-09 LAB — COMPREHENSIVE METABOLIC PANEL
ALT: 23 IU/L (ref 0–44)
AST: 29 IU/L (ref 0–40)
Albumin/Globulin Ratio: 1.6 (ref 1.2–2.2)
Albumin: 4.4 g/dL (ref 3.5–4.8)
Alkaline Phosphatase: 68 IU/L (ref 39–117)
BUN/Creatinine Ratio: 12 (ref 10–24)
BUN: 13 mg/dL (ref 8–27)
Bilirubin Total: 1 mg/dL (ref 0.0–1.2)
CO2: 23 mmol/L (ref 20–29)
Calcium: 9.2 mg/dL (ref 8.6–10.2)
Chloride: 105 mmol/L (ref 96–106)
Creatinine, Ser: 1.08 mg/dL (ref 0.76–1.27)
GFR calc Af Amer: 77 mL/min/{1.73_m2} (ref >59)
GFR calc non Af Amer: 67 mL/min/{1.73_m2} (ref >59)
Globulin, Total: 2.7 g/dL (ref 1.5–4.5)
Glucose: 108 mg/dL — ABNORMAL HIGH (ref 65–99)
Potassium: 3.8 mmol/L (ref 3.5–5.2)
Sodium: 144 mmol/L (ref 134–144)
Total Protein: 7.1 g/dL (ref 6.0–8.5)

## 2017-04-09 LAB — MAGNESIUM: Magnesium: 2.3 mg/dL (ref 1.6–2.3)

## 2017-04-09 MED ORDER — ROSUVASTATIN CALCIUM 20 MG PO TABS: 20.0000 mg | ORAL_TABLET | Freq: Every day | ORAL | 3 refills | Status: DC

## 2017-04-09 NOTE — Progress Notes (Signed)
Cardiology Office Note   Date:  04/09/2017   ID:  Douglas Keller, DOB July 28, 1940, MRN [id number]  PCP:  Sharilyn Sites, MD  Cardiologist:  Dr Sallyanne Kuster, 08/21/2016  Rosaria Ferries, PA-C   Chief Complaint  Patient presents with  . Follow-up    pt states he believes his problems stem from switching from crestor    History of Present Illness: Douglas Keller is a 76 y.o. male with a history of HTN, HLD, DM, mod CAD at cath 2006, AoV sclerosis>>SEM, vertigo, PAD w/ mod celiac stenosis (50%) on CT 08/2016, low risk MV 09/2016  Douglas Keller presents for cardiology follow up.   He was told he had vertigo>>went through vestibular rehab but was told there he did not have vertigo.   He is able to be fairly active but does not exercise. He plays golf and walks (more than he has to) when he does. He plays 3 times a week. He is active with yard work as well. He can walk at his church if climate control is needed.   He does not eat healthy (he admits).   When he is sleeping, sometimes if he turns on his R side, he will feel he is losing consciousness. He also gets a line of pain running from his neck up to his head. Not all the time, but happens 2 x week or so.   He has been having more back and neck problems recently in general. He is going to see Dr Hilma Favors and get MRIs to look at his back and neck.   He occasionally gets dizzy when he stands up,But is not having consistent orthostatic symptoms.   His heart is in the low 50s, it has been low before and he is asymptomatic with this.   Past Medical History:  Diagnosis Date  . Aortic valve sclerosis   . Atherosclerosis of coronary artery without angina pectoris 07/05/2014  . Diabetes mellitus type 2, diet-controlled ([place])   . HTN (hypertension) 07/06/2013  . Hyperlipidemia 07/06/2013    Past Surgical History:  Procedure Laterality Date  . CERVICAL SPINE SURGERY    . COLONOSCOPY  07/26/2012   Procedure: COLONOSCOPY;  Surgeon: Jamesetta So, MD;  Location: [facility];  Service: Gastroenterology;  Laterality: N/A;  . KNEE ARTHROSCOPY      Current Outpatient Prescriptions  Medication Sig Dispense Refill  . amLODipine (NORVASC) 10 MG tablet Take 1 tablet (10 mg total) by mouth daily. 30 tablet 6  . aspirin EC 81 MG tablet Take 81 mg by mouth at bedtime.    . chlorthalidone (HYGROTON) 25 MG tablet Take 1 tablet (25 mg total) by mouth daily. (Patient taking differently: Take 12.5 mg by mouth daily. ) 90 tablet 3  . fish oil-omega-3 fatty acids 1000 MG capsule Take 1 g by mouth 2 (two) times daily.    Marland Kitchen GLUCOSAMINE PO Take 1 tablet by mouth at bedtime.    Marland Kitchen lisinopril (PRINIVIL,ZESTRIL) 40 MG tablet Take 1 tablet (40 mg total) by mouth daily. 30 tablet 6  . Magnesium 500 MG CAPS Take 1 capsule by mouth daily.    . meclizine (ANTIVERT) 25 MG tablet continuous as needed.    . Multiple Vitamin (MULITIVITAMIN WITH MINERALS) TABS Take 1 tablet by mouth daily.    . potassium chloride (KLOR-CON M10) 10 MEQ tablet Take 1 tablet (10 mEq total) by mouth daily. 30 tablet 6  . rosuvastatin (CRESTOR) 20 MG tablet Take 1 tablet (20 mg total)  by mouth daily. 30 tablet 3   No current facility-administered medications for this visit.     Allergies:   Patient has no known allergies.    Social History:  The patient  reports that he has never smoked. He has never used smokeless tobacco. He reports that he does not drink alcohol or use drugs.   Family History:  The patient's family history includes Asthma in his mother; Hypertension in his father.    ROS:  Please see the history of present illness. All other systems are reviewed and negative.    PHYSICAL EXAM: VS:  BP 140/78   Pulse (!) 52   Ht 5\' 7"  (1.702 m)   Wt 207 lb (93.9 kg)   BMI 32.42 kg/m  , BMI Body mass index is 32.42 kg/m. GEN: Well nourished, well developed, male in no acute distress  HEENT: normal for age  Neck: no JVD, no carotid bruit, no masses Cardiac:  RRR; no murmur, no rubs, or gallops Respiratory:  clear to auscultation bilaterally, normal work of breathing GI: soft, nontender, nondistended, + BS MS: no deformity or atrophy; no edema; distal pulses are 2+ in all 4 extremities   Skin: warm and dry, no rash Neuro:  Strength and sensation are intact Psych: euthymic mood, full affect   EKG:  EKG is ordered today. The ekg ordered today demonstrates sinus brady, HR 52, no acute ischemic changes  MYOVIEW: 09/25/2016  The left ventricular ejection fraction is mildly decreased (45-54%).  Nuclear stress EF: 53%.  Blood pressure demonstrated a normal response to exercise.  There was no ST segment deviation noted during stress.  The study is normal.  This is a low risk study.  Recent Labs: 09/13/2016: Hemoglobin 14.9; Platelets 255 09/14/2016: BUN 13; Creat 1.10; Magnesium 2.1; Potassium 3.6; Sodium 144; TSH 1.67    Lipid Panel    Component Value Date/Time   CHOL 129 11/18/2015 0750   TRIG 114 11/18/2015 0750   HDL 38 (L) 11/18/2015 0750   CHOLHDL 3.4 11/18/2015 0750   VLDL 23 11/18/2015 0750   LDLCALC 68 11/18/2015 0750     Wt Readings from Last 3 Encounters:  04/09/17 207 lb (93.9 kg)  09/25/16 205 lb (93 kg)  09/14/16 205 lb (93 kg)     Other studies Reviewed: Additional studies/ records that were reviewed today include: office notes, hospital records and testing.  ASSESSMENT AND PLAN:  1.  Moderate coronary artery disease: He had a 40% proximal LAD and a 40% mid RCA at cath in 2006. Stress test in March 2018 showed a minimally decreased EF with normal perfusion. Continue cardiac risk factor reduction.  2. Hyperlipidemia: He is on high-dose Lipitor. He is encouraged to modify his eating habits as he admits he eats very poorly.  He feels strongly that the Lipitor is responsible for many of the symptoms he is having. This includes balance problems and pain in his legs. He wishes to change back to Crestor. That is okay  and we will start him on Crestor 20 mg daily. He is worried about the cost of the medication. I advised that if he starts the Crestor and stops the Lipitor, and his symptoms improve, we may be able to get his insurance company to authorize payment for this. It may also be cheaper than it used to be because it now comes generic.  3. Hypertension: His blood pressure slightly above target. However, he feels that it runs normal most of the time. He is  encouraged to continue to follow this and let us or his PCP no if he remains above target.  4. Bradycardia: He has sinus bradycardia in the low 50s. He is asymptomatic with this at this time. If he continues to have symptoms of dizziness and lightheadedness, he we may need to have him wear a monitor to see if his heart rate is dropping low enough to cause the symptoms.   Current medicines are reviewed at length with the patient today.  The patient has concerns regarding medicines. Concerns were addressed  The following changes have been made:  Stop Lipitor, start Crestor  Labs/ tests ordered today include:   Orders Placed This Encounter  Procedures  . Magnesium  . Comprehensive metabolic panel  . Lipid panel  . EKG 12-Lead     Disposition:   FU with Dr Sallyanne Kuster  Signed, Rosaria Ferries, PA-C  04/09/2017 2:10 PM    [place] Phone: [phone]; Fax: [phone]  This note was written with the assistance of speech recognition software. Please excuse any transcriptional errors.

## 2017-04-09 NOTE — Progress Notes (Signed)
Thanks MCr 

## 2017-04-09 NOTE — Patient Instructions (Signed)
Medication Instructions:  STOP LIPITOR START CRESTOR 20MG  DAILY  If you need a refill on your cardiac medications before your next appointment, please call your pharmacy.  Labwork: LIPID, CMET, MAG TODAY  HERE IN OUR OFFICE AT [facility]  Follow-Up: Your physician wants you to follow-up in: February 2019 You should receive a reminder letter in the mail two months in advance. If you do not receive a letter, please call our office December 2018 to schedule the February 2019 follow-up appointment.   Special Instructions: PLEASE GIVE [place] A CALL TO DISCUSS IF SYMPTOMS HAVE STOPPED DUE TO STOPPING LIPITOR   Thank you for choosing [facility] at [facility]!!

## 2017-04-15 ENCOUNTER — Telehealth: Payer: Self-pay | Admitting: *Deleted

## 2017-04-15 MED ORDER — MAGNESIUM 500 MG PO CAPS: 1.0000 | ORAL_CAPSULE | ORAL | Status: DC

## 2017-04-15 NOTE — Telephone Encounter (Signed)
Results and recommendations discussed with patient, who verbalized understanding of instructions and thanks. Changes made to med list, pt aware to call if any needs prior to next OV.

## 2017-04-15 NOTE — Telephone Encounter (Signed)
-----   Message from Lonn Georgia, PA-C sent at 04/14/2017  4:11 PM EDT ----- Pt of Dr Sallyanne Kuster Please let him know that his magnesium level is a little high. He should take the magnesium on M-W-F only. Other labs were ok, his cholesterol not bad.  Thanks

## 2017-04-19 DIAGNOSIS — E119 Type 2 diabetes mellitus without complications: Secondary | ICD-10-CM | POA: Diagnosis not present

## 2017-04-19 DIAGNOSIS — R55 Syncope and collapse: Secondary | ICD-10-CM | POA: Diagnosis not present

## 2017-04-19 DIAGNOSIS — Z6832 Body mass index (BMI) 32.0-32.9, adult: Secondary | ICD-10-CM | POA: Diagnosis not present

## 2017-04-19 DIAGNOSIS — E6609 Other obesity due to excess calories: Secondary | ICD-10-CM | POA: Diagnosis not present

## 2017-04-19 DIAGNOSIS — Z23 Encounter for immunization: Secondary | ICD-10-CM | POA: Diagnosis not present

## 2017-05-03 ENCOUNTER — Other Ambulatory Visit: Payer: Self-pay | Admitting: Physician Assistant

## 2017-05-03 ENCOUNTER — Telehealth: Payer: Self-pay | Admitting: Cardiovascular Disease

## 2017-05-03 DIAGNOSIS — R55 Syncope and collapse: Principal | ICD-10-CM

## 2017-05-03 DIAGNOSIS — R42 Dizziness and giddiness: Secondary | ICD-10-CM

## 2017-05-03 NOTE — Telephone Encounter (Signed)
New message    Pt is calling stating his primary doctor told him to call and see about a heart monitor.

## 2017-05-03 NOTE — Telephone Encounter (Signed)
Per staff message: Druscilla Brownie, LPN; 41/03/01 31:43OO pt is scheduled for monitor.

## 2017-05-03 NOTE — Telephone Encounter (Signed)
S/w pt he states that he was told by his primary care that since they cannot figure out why he is having sx dizziness(-for vertigo) and presyncope upon standing continues and was told he should call and let us know that he thinks, PCP, that we should order a monitor. He states that this is worsened even he is a little bit late eating, he states that he is working on this. Can we order a monitor for dizziness>

## 2017-05-03 NOTE — Telephone Encounter (Signed)
I have ordered the monitor, can you make sure it gets scheduled? Thanks

## 2017-05-03 NOTE — Telephone Encounter (Signed)
Staff message sent to [place] scheduling

## 2017-05-18 ENCOUNTER — Ambulatory Visit (INDEPENDENT_AMBULATORY_CARE_PROVIDER_SITE_OTHER): Payer: Medicare HMO

## 2017-05-18 DIAGNOSIS — R55 Syncope and collapse: Secondary | ICD-10-CM

## 2017-05-18 DIAGNOSIS — R42 Dizziness and giddiness: Secondary | ICD-10-CM

## 2017-07-05 DIAGNOSIS — Z23 Encounter for immunization: Secondary | ICD-10-CM | POA: Diagnosis not present

## 2017-07-05 DIAGNOSIS — M5136 Other intervertebral disc degeneration, lumbar region: Secondary | ICD-10-CM | POA: Diagnosis not present

## 2017-07-05 DIAGNOSIS — Z6826 Body mass index (BMI) 26.0-26.9, adult: Secondary | ICD-10-CM | POA: Diagnosis not present

## 2017-07-05 DIAGNOSIS — E119 Type 2 diabetes mellitus without complications: Secondary | ICD-10-CM | POA: Diagnosis not present

## 2017-07-05 DIAGNOSIS — M1712 Unilateral primary osteoarthritis, left knee: Secondary | ICD-10-CM | POA: Diagnosis not present

## 2017-07-28 DIAGNOSIS — Z6832 Body mass index (BMI) 32.0-32.9, adult: Secondary | ICD-10-CM | POA: Diagnosis not present

## 2017-07-28 DIAGNOSIS — E119 Type 2 diabetes mellitus without complications: Secondary | ICD-10-CM | POA: Diagnosis not present

## 2017-07-28 DIAGNOSIS — R252 Cramp and spasm: Secondary | ICD-10-CM | POA: Diagnosis not present

## 2017-07-28 DIAGNOSIS — I10 Essential (primary) hypertension: Secondary | ICD-10-CM | POA: Diagnosis not present

## 2017-07-28 DIAGNOSIS — Z1389 Encounter for screening for other disorder: Secondary | ICD-10-CM | POA: Diagnosis not present

## 2017-07-28 DIAGNOSIS — E6609 Other obesity due to excess calories: Secondary | ICD-10-CM | POA: Diagnosis not present

## 2017-08-16 ENCOUNTER — Other Ambulatory Visit: Payer: Self-pay | Admitting: Ophthalmology

## 2017-08-16 DIAGNOSIS — D23112 Other benign neoplasm of skin of right lower eyelid, including canthus: Secondary | ICD-10-CM | POA: Diagnosis not present

## 2017-08-26 ENCOUNTER — Encounter: Payer: Self-pay | Admitting: Cardiovascular Disease

## 2017-08-26 ENCOUNTER — Ambulatory Visit: Payer: Medicare HMO | Admitting: Cardiovascular Disease

## 2017-08-26 VITALS — BP 139/84 | HR 57 | Resp 16 | Ht 66.0 in | Wt 208.2 lb

## 2017-08-26 DIAGNOSIS — E785 Hyperlipidemia, unspecified: Secondary | ICD-10-CM

## 2017-08-26 DIAGNOSIS — E669 Obesity, unspecified: Secondary | ICD-10-CM | POA: Diagnosis not present

## 2017-08-26 DIAGNOSIS — I10 Essential (primary) hypertension: Secondary | ICD-10-CM | POA: Diagnosis not present

## 2017-08-26 DIAGNOSIS — I2510 Atherosclerotic heart disease of native coronary artery without angina pectoris: Secondary | ICD-10-CM | POA: Diagnosis not present

## 2017-08-26 DIAGNOSIS — E1169 Type 2 diabetes mellitus with other specified complication: Secondary | ICD-10-CM

## 2017-08-26 NOTE — Patient Instructions (Signed)
Medication Instructions: Dr Sallyanne Kuster recommends that you continue on your current medications as directed. Please refer to the Current Medication list given to you today.  Labwork: Your physician recommends that you return for lab work at your earliest [place].   Testing/Procedures: NONE ORDERED  Follow-up: Dr Sallyanne Kuster recommends that you schedule a follow-up appointment in 12 months. You will receive a reminder letter in the mail two months in advance. If you don't receive a letter, please call our office to schedule the follow-up appointment.  If you need a refill on your cardiac medications before your next appointment, please call your pharmacy.

## 2017-08-26 NOTE — Progress Notes (Signed)
[name], male   DOB: May 01, 1941, 77 y.o.   MRN: [id number]    Cardiology Office Note    Date:  08/26/2017   ID:  Douglas Keller, DOB 12/05/1940, MRN [id number]  PCP:  Douglas Sites, MD  Cardiologist:   Douglas Klein, MD   Chief Complaint  Patient presents with  . Follow-up    CAD, HTN    History of Present Illness:  Douglas Keller is a 77 y.o. male with hyperlipidemia, hypertension, moderate coronary atherosclerosis and a murmur due to aortic valve sclerosis without stenosis.   He tolerated Crestor and had good lipid reduction for years but this is no longer covered by his insurance.  He has tried generic rosuvastatin but this has caused intolerable muscle aches despite coenzyme Q 10 administration.  He has also tried pravastatin and atorvastatin with similar results.  He continues to have occasional problems with vertigo and tinnitus.  He went to vestibular rehab and has seen an ear nose and throat specialist in [place], but was told that "everything is okay".  He has not had any falls.  He is going to the gym regularly.  He is exercising more frequently, but complains that he is actually gaining weight instead of losing it.  He has not had any problems with exertional chest pain and denies leg edema.  He states that his right foot is always cold and is often sore, although it is hard to elicit a typical pattern of intermittent claudication.  His diabetes is managed with diet alone at this point.  Cardiac catheterization in 2006 showed  non obstructive atherosclerosis (40% proximal LAD, 40% mid RCA) and had nuclear stress test in 2010 with normal perfusion. Echocardiography showed normal left ventricular size and systolic function with aortic valve sclerosis. By his last nuclear stress test LVEF was only 48% but he has no signs or symptoms of congestive heart failure. He has treated hypertension and hyperlipidemia on statin.    Past Medical History:  Diagnosis Date  .  Aortic valve sclerosis   . Atherosclerosis of coronary artery without angina pectoris 07/05/2014  . Diabetes mellitus type 2, diet-controlled ([place])   . HTN (hypertension) 07/06/2013  . Hyperlipidemia 07/06/2013    Past Surgical History:  Procedure Laterality Date  . CERVICAL SPINE SURGERY    . COLONOSCOPY  07/26/2012   Procedure: COLONOSCOPY;  Surgeon: Jamesetta So, MD;  [facility];  Service: Gastroenterology;  Laterality: N/A;  . KNEE ARTHROSCOPY      Outpatient Medications Prior to Visit  Medication Sig Dispense Refill  . amLODipine (NORVASC) 10 MG tablet Take 1 tablet (10 mg total) by mouth daily. 30 tablet 6  . chlorthalidone (HYGROTON) 25 MG tablet Take 1 tablet (25 mg total) by mouth daily. (Patient taking differently: Take 12.5 mg by mouth daily. ) 90 tablet 3  . Coenzyme Q10 (COQ10) 100 MG CAPS Take 1 capsule by mouth daily.    Marland Kitchen GLUCOSAMINE PO Take 1 tablet by mouth at bedtime.    Marland Kitchen lisinopril (PRINIVIL,ZESTRIL) 40 MG tablet Take 1 tablet (40 mg total) by mouth daily. 30 tablet 6  . Multiple Vitamin (MULITIVITAMIN WITH MINERALS) TABS Take 1 tablet by mouth daily.    . Multiple Vitamins-Minerals (AIRBORNE PO) Take 1 each by mouth daily.    . potassium chloride (KLOR-CON M10) 10 MEQ tablet Take 1 tablet (10 mEq total) by mouth daily. 30 tablet 6  . KLOR-CON M20 20 MEQ tablet     .  naproxen (NAPROSYN) 375 MG tablet     . rosuvastatin (CRESTOR) 20 MG tablet Take 1 tablet (20 mg total) by mouth daily. 30 tablet 3  . aspirin EC 81 MG tablet Take 81 mg by mouth at bedtime.    . fish oil-omega-3 fatty acids 1000 MG capsule Take 1 g by mouth 2 (two) times daily.    . Magnesium 500 MG CAPS Take 1 capsule (500 mg total) by mouth every Monday, Wednesday, and Friday.    . meclizine (ANTIVERT) 25 MG tablet continuous as needed.     No facility-administered medications prior to visit.      Allergies:   Crestor [rosuvastatin]   Social History   Socioeconomic History  .  Marital status: Widowed    Spouse name: None  . Number of children: None  . Years of education: None  . Highest education level: None  Social Needs  . Financial resource strain: None  . Food insecurity - worry: None  . Food insecurity - inability: None  . Transportation needs - medical: None  . Transportation needs - non-medical: None  Occupational History  . None  Tobacco Use  . Smoking status: Never Smoker  . Smokeless tobacco: Never Used  Substance and Sexual Activity  . Alcohol use: No  . Drug use: No  . Sexual activity: None  Other Topics Concern  . None  Social History Narrative  . None     Family History:  The patient's family history includes Asthma in his mother; Hypertension in his father.   ROS:   Please see the history of present illness.    ROS All other systems reviewed and are negative.   PHYSICAL EXAM:   VS:  BP 139/84   Pulse (!) 57   Resp 16   Ht 5\' 6"  (1.676 m)   Wt 208 lb 3.2 oz (94.4 kg)   SpO2 98%   BMI 33.60 kg/m     General: Alert, oriented x3, no distress, mildly obese Head: no evidence of trauma, PERRL, EOMI, no exophtalmos or lid lag, no myxedema, no xanthelasma; normal ears, nose and oropharynx Neck: normal jugular venous pulsations and no hepatojugular reflux; brisk carotid pulses without delay and no carotid bruits Chest: clear to auscultation, no signs of consolidation by percussion or palpation, normal fremitus, symmetrical and full respiratory excursions Cardiovascular: normal position and quality of the apical impulse, regular rhythm, normal first and second heart sounds, early peaking 2/6 aortic ejection murmur no diastolic murmurs, rubs or gallops Abdomen: no tenderness or distention, no masses by palpation, no abnormal pulsatility or arterial bruits, normal bowel sounds, no hepatosplenomegaly Extremities: no clubbing, cyanosis or edema; 2+ radial, ulnar and brachial pulses bilaterally; 2+ right femoral, posterior tibial and  dorsalis pedis pulses; 2+ left femoral, posterior tibial and dorsalis pedis pulses; no subclavian or femoral bruits Neurological: grossly nonfocal Psych: Normal mood and affect   Wt Readings from Last 3 Encounters:  08/26/17 208 lb 3.2 oz (94.4 kg)  04/09/17 207 lb (93.9 kg)  09/25/16 205 lb (93 kg)      Studies/Labs Reviewed:   EKG:  EKG is ordered today.  The ekg ordered today demonstrates normal sinus rhythm with a single PVC, poor anterior R wave progression, otherwise normal.  There are no repolarization abnormalities  Recent Labs: 09/13/2016: Hemoglobin 14.9; Platelets 255 09/14/2016: TSH 1.67 04/09/2017: ALT 23; BUN 13; Creatinine, Ser 1.08; Magnesium 2.3; Potassium 3.8; Sodium 144   Lipid Panel    Component Value Date/Time  CHOL 135 04/09/2017 1045   TRIG 101 04/09/2017 1045   HDL 43 04/09/2017 1045   CHOLHDL 3.1 04/09/2017 1045   CHOLHDL 3.4 11/18/2015 0750   VLDL 23 11/18/2015 0750   LDLCALC 72 04/09/2017 1045     ASSESSMENT:    No diagnosis found.   PLAN:  In order of problems listed above:  1. CAD: Does not have angina at rest or with activity. Focus on risk factors.  Target LDL under 70. 2. HTN: Control is fair.  Blood pressure is a little high today but is substantially lower when he checks it at home.  We had to cut back his dose of chlorthalidone because of symptomatic hypotension.  Would remain on same medications. 3. HLP: Recheck lipids today.  Target LDL less than 70.  We will try to get coverage for brand name Crestor or Repatha.  Although he has never had a myocardial infarction he has established coronary atherosclerosis with moderate stenoses by angiography.  I would consider him a secondary prevention patient. 4. DM/obesity: I encouraged him to continue exercising at the gym and not be discouraged by slight weight gain.  He will we have substantial benefit from being physically fit whether or not he actually loses weight.   Medication  Adjustments/Labs and Tests Ordered: Current medicines are reviewed at length with the patient today.  Concerns regarding medicines are outlined above.  Medication changes, Labs and Tests ordered today are listed in the Patient Instructions below. There are no Patient Instructions on file for this visit.     Signed, Douglas Klein, MD  08/26/2017 11:52 AM    [facility] [place], [place], [place]  [address] Phone: [phone]; Fax: [phone]

## 2017-08-27 DIAGNOSIS — E119 Type 2 diabetes mellitus without complications: Secondary | ICD-10-CM | POA: Diagnosis not present

## 2017-08-27 DIAGNOSIS — I2510 Atherosclerotic heart disease of native coronary artery without angina pectoris: Secondary | ICD-10-CM | POA: Diagnosis not present

## 2017-08-27 DIAGNOSIS — E785 Hyperlipidemia, unspecified: Secondary | ICD-10-CM | POA: Diagnosis not present

## 2017-08-28 LAB — LIPID PANEL
Chol/HDL Ratio: 6.8 ratio — ABNORMAL HIGH (ref 0.0–5.0)
Cholesterol, Total: 251 mg/dL — ABNORMAL HIGH (ref 100–199)
HDL: 37 mg/dL — ABNORMAL LOW (ref >39)
LDL Calculated: 166 mg/dL — ABNORMAL HIGH (ref 0–99)
Triglycerides: 242 mg/dL — ABNORMAL HIGH (ref 0–149)
VLDL Cholesterol Cal: 48 mg/dL — ABNORMAL HIGH (ref 5–40)

## 2017-09-02 ENCOUNTER — Telehealth: Payer: Self-pay

## 2017-09-02 MED ORDER — CRESTOR 20 MG PO TABS: 20.0000 mg | ORAL_TABLET | Freq: Every day | ORAL | 3 refills | Status: DC

## 2017-09-02 NOTE — Telephone Encounter (Signed)
Called patient with results. Patient verbalized understanding and agreed with plan. Rx(s) sent to patient's preferred pharmacies electronically.  Prior authorization for Crestor 20 mg sent to patient's insurance company via [email or web address]. Awaiting approval.

## 2017-09-02 NOTE — Telephone Encounter (Signed)
-----   Message from Sanda Klein, MD sent at 08/28/2017  1:57 PM EST ----- LDL very high. If we cannot get brand name crestor, please start [place].

## 2017-09-02 NOTE — Telephone Encounter (Signed)
-----   Message from Sanda Klein, MD sent at 08/28/2017  1:57 PM EST ----- LDL very high. If we cannot get brand name crestor, please start [place].

## 2017-09-03 ENCOUNTER — Encounter: Payer: Self-pay | Admitting: Cardiovascular Disease

## 2017-09-03 NOTE — Telephone Encounter (Signed)
Prior authorization for Crestor has been denied. Appeal process started, see letter in [place]. Appeal sent to plan via [email or web address]

## 2017-09-08 NOTE — Telephone Encounter (Signed)
Follow  Up    [employer] calling to request additional clinical to process  Crestor authorization. Fax # [phone] Ref # [id number]

## 2017-09-09 DIAGNOSIS — H5210 Myopia, unspecified eye: Secondary | ICD-10-CM | POA: Diagnosis not present

## 2017-09-09 DIAGNOSIS — H2510 Age-related nuclear cataract, unspecified eye: Secondary | ICD-10-CM | POA: Diagnosis not present

## 2017-09-09 DIAGNOSIS — I10 Essential (primary) hypertension: Secondary | ICD-10-CM | POA: Diagnosis not present

## 2017-09-21 DIAGNOSIS — R202 Paresthesia of skin: Secondary | ICD-10-CM | POA: Diagnosis not present

## 2017-09-21 DIAGNOSIS — Z6832 Body mass index (BMI) 32.0-32.9, adult: Secondary | ICD-10-CM | POA: Diagnosis not present

## 2017-09-21 DIAGNOSIS — E782 Mixed hyperlipidemia: Secondary | ICD-10-CM | POA: Diagnosis not present

## 2017-09-21 DIAGNOSIS — M48062 Spinal stenosis, lumbar region with neurogenic claudication: Secondary | ICD-10-CM | POA: Diagnosis not present

## 2017-09-21 DIAGNOSIS — E6609 Other obesity due to excess calories: Secondary | ICD-10-CM | POA: Diagnosis not present

## 2017-09-21 IMAGING — CT CT ANGIO CHEST-ABD-PELV FOR DISSECTION W/ AND WO/W CM
2 of 7 series · 12 of 36 positions shown, 17 images · IV contrast (Isovue)
Comparison: Chest x-ray 09/13/2016

CLINICAL DATA: Hypertension complaining of forearm pain

EXAM:
CT ANGIOGRAPHY CHEST, ABDOMEN AND PELVIS
TECHNIQUE: Multidetector CT imaging through the chest, abdomen and pelvis was
performed using the standard protocol during bolus administration of
intravenous contrast. Multiplanar reconstructed images and MIPs were
obtained and reviewed to evaluate the vascular anatomy.
CONTRAST:  100 mL Isovue 370 intravenous

[Series 5: dissection axial arterial · axial · arterial · 0.76mm/px · z∈[-597,-57]mm · 11 of 204 slices shown, 15 images]
[im 12/204  mediastinal]
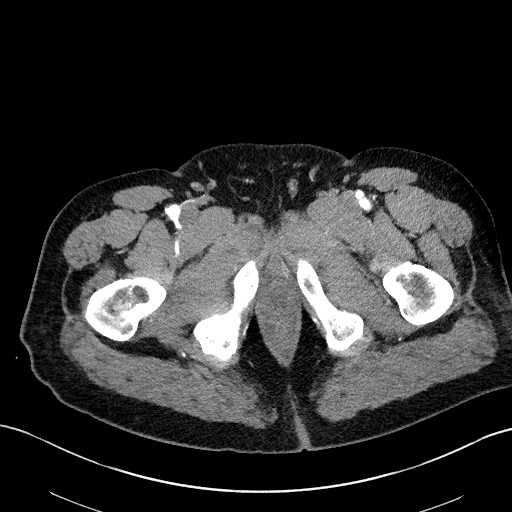
[im 12/204  bone]
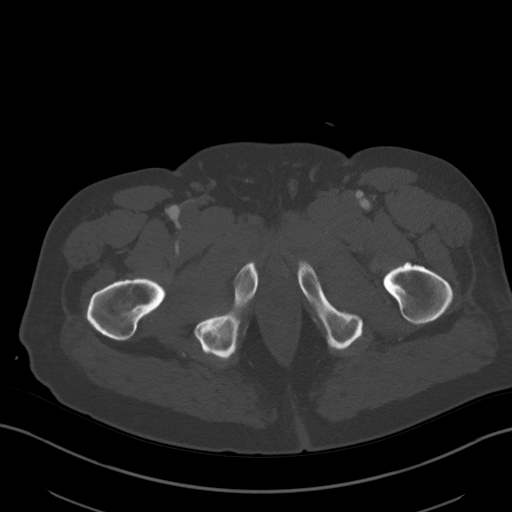
[im 36/204  mediastinal]
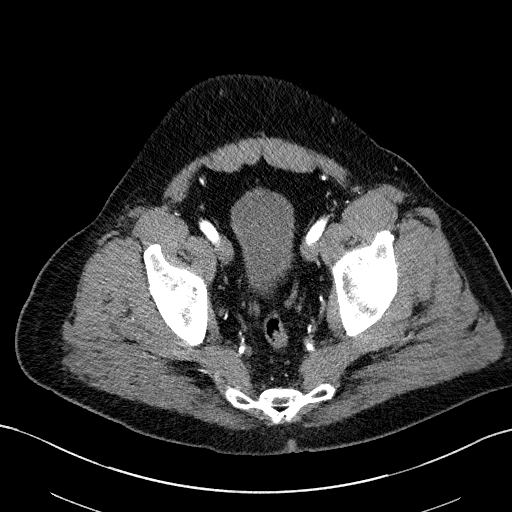
[im 60/204  mediastinal]
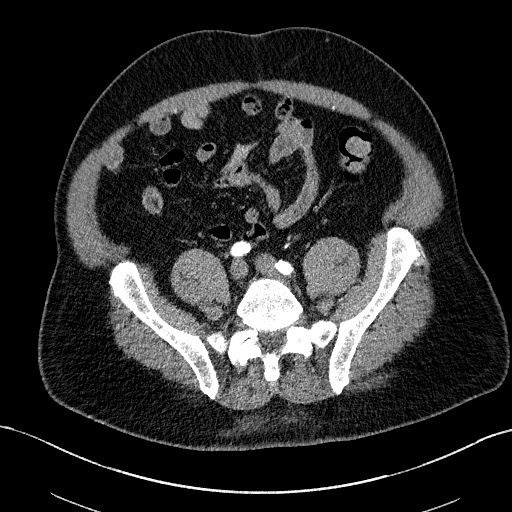
[im 84/204  mediastinal]
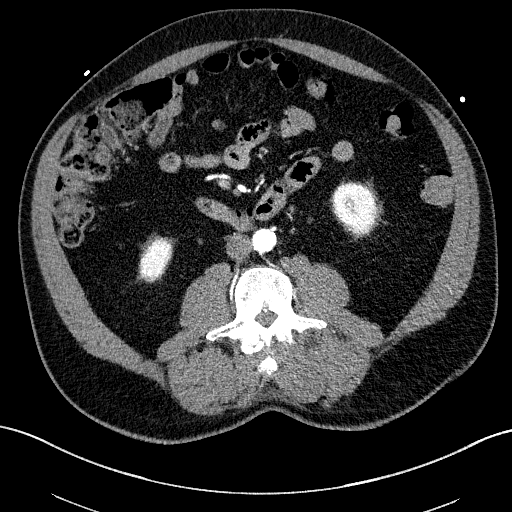
[im 108/204  mediastinal]
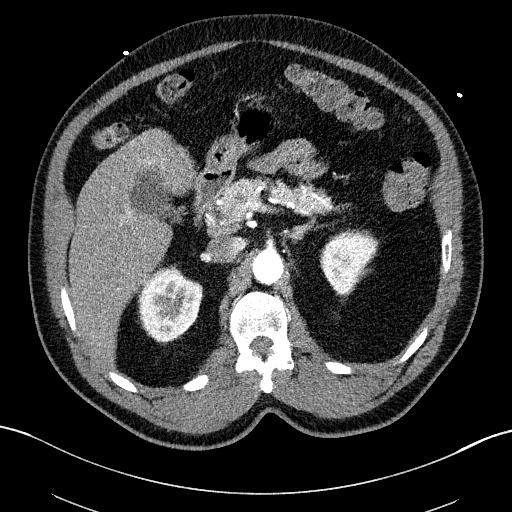
[im 120/204  mediastinal]
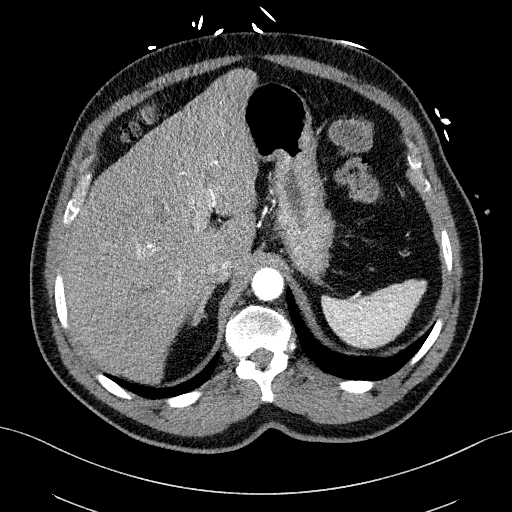
[im 144/204  mediastinal]
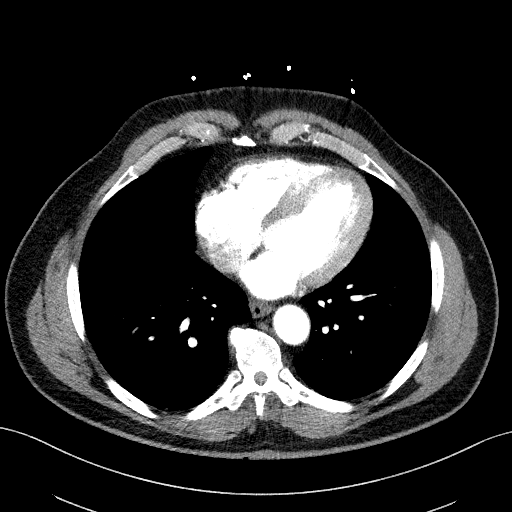
[im 156/204  lung]
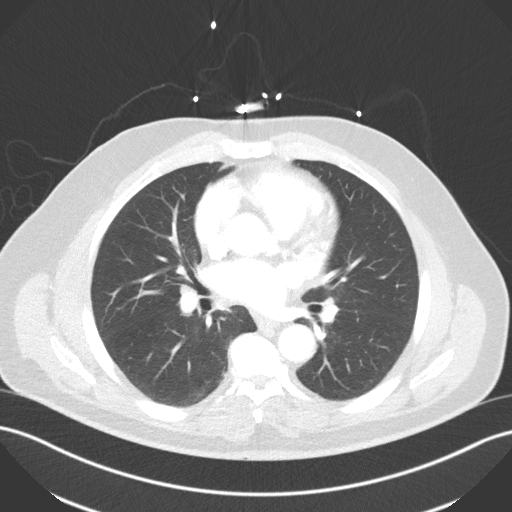
[im 168/204  mediastinal]
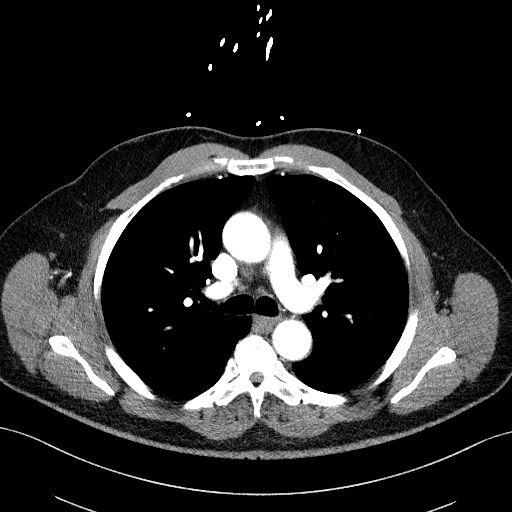
[im 168/204  lung]
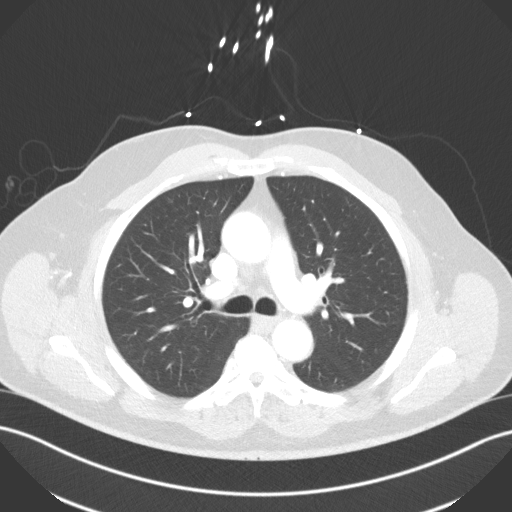
[im 180/204  lung]
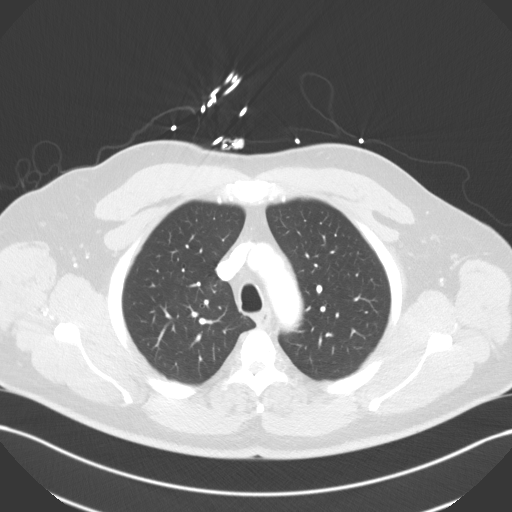
[im 192/204  mediastinal]
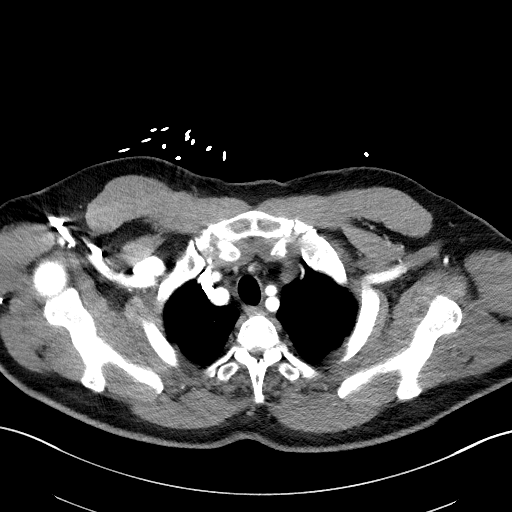
[im 192/204  lung]
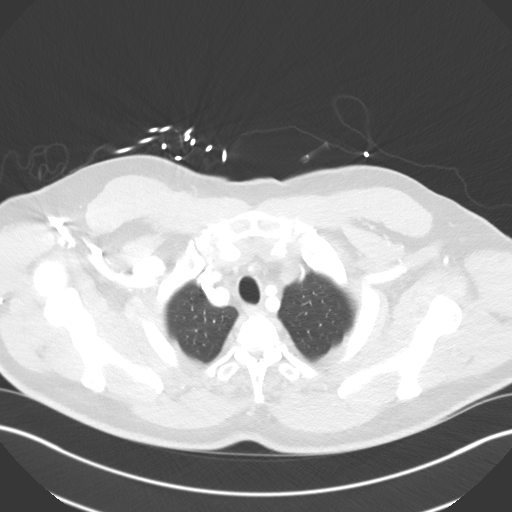
[im 192/204  bone]
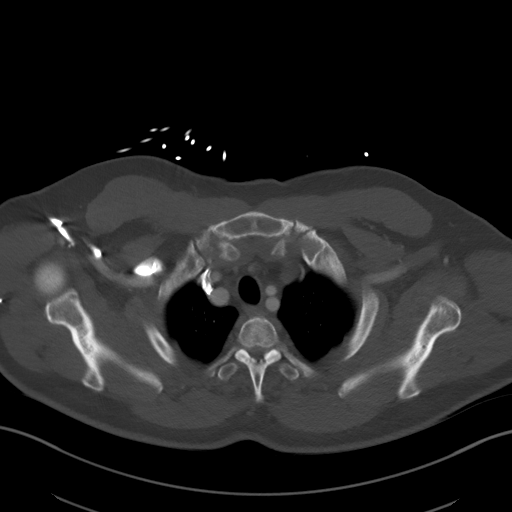

[Series 9: coronals · coronal · 0.88mm/px · 1 of 166 slices shown, 2 images]
[im 83/166  mediastinal]
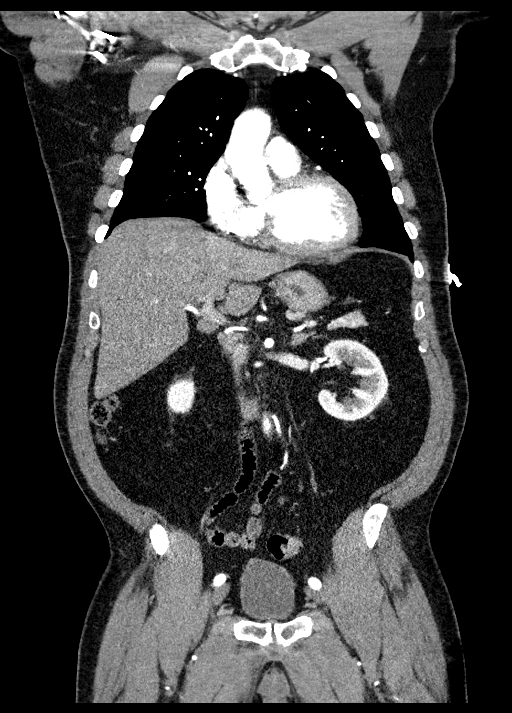
[im 83/166  bone]
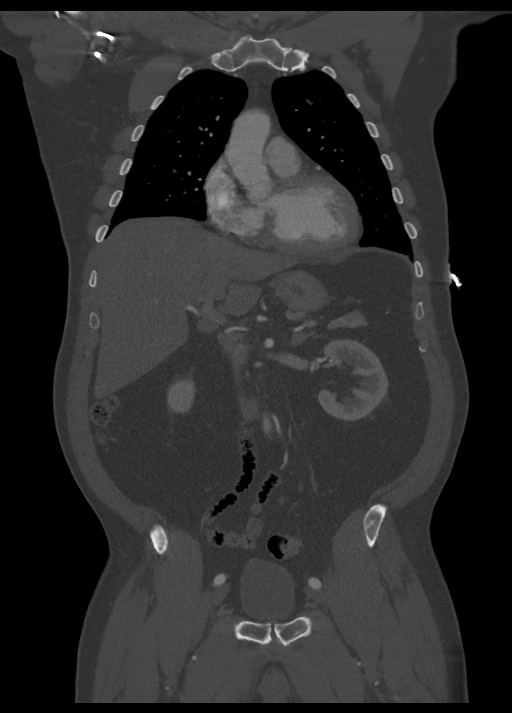

[12 of 36 positions shown; findings below may reference images not displayed]

FINDINGS: CTA CHEST FINDINGS

Cardiovascular: Pre contrasted images demonstrate no intramural
hematoma. Atherosclerotic calcifications. Coronary artery
calcifications.

Aorta is non aneurysmal. There is no acute dissection seen.
Left-sided arch with normal 3 vessel origin. Borderline heart size.
No significant pericardial effusion.

Mediastinum/Nodes: No enlarged mediastinal, hilar, or axillary lymph
nodes. Thyroid gland, trachea, and esophagus demonstrate no
significant findings.

Lungs/Pleura: Lungs are clear. No pleural effusion or pneumothorax.

Musculoskeletal: No chest wall abnormality. No acute or significant
osseous findings.

Review of the MIP images confirms the above findings.

CTA ABDOMEN AND PELVIS FINDINGS

VASCULAR

Aorta: Normal caliber aorta without aneurysm, dissection, vasculitis
or significant stenosis. Scattered atherosclerotic calcifications.

Celiac: Moderate stenosis at the origin estimated 50%. Distally the
vessel is patent.

SMA: Patent without evidence of aneurysm, dissection, vasculitis or
significant stenosis.

Renals: Both renal arteries are patent without evidence of aneurysm,
dissection, vasculitis, fibromuscular dysplasia or significant
stenosis.

IMA: Patent without evidence of aneurysm, dissection, vasculitis or
significant stenosis.

Inflow: Patent without evidence of aneurysm, dissection, vasculitis
or significant stenosis. Scattered atherosclerotic calcifications.

Veins: No obvious venous abnormality within the limitations of this
arterial phase study.

Review of the MIP images confirms the above findings.

NON-VASCULAR

Hepatobiliary: No focal liver abnormality is seen. No gallstones,
gallbladder wall thickening, or biliary dilatation.

Pancreas: Unremarkable. No pancreatic ductal dilatation or
surrounding inflammatory changes.

Spleen: Normal in size without focal abnormality.

Adrenals/Urinary Tract: Adrenal glands are unremarkable. Kidneys are
normal, without renal calculi, focal lesion, or hydronephrosis.
Bladder is unremarkable.

Stomach/Bowel: Stomach is within normal limits. Appendix not well
identified but no right lower quadrant inflammation No evidence of
bowel wall thickening, distention, or inflammatory changes.

Lymphatic: No significantly enlarged lymph nodes

Reproductive: Enlarged prostate

Other: No free air or free fluid. Small fat containing umbilical
hernia

Musculoskeletal: No acute or significant osseous findings.

Review of the MIP images confirms the above findings.
IMPRESSION: 1. Negative for acute aortic dissection. No evidence for aortic
aneurysm. Scattered atherosclerotic vascular disease. Moderate
stenosis at the origin of the celiac artery. Remaining abdominal
vessels appear patent.
2. No CT evidence for acute intra-abdominal or pelvic pathology.
3. Enlarged prostate

## 2017-09-28 ENCOUNTER — Other Ambulatory Visit (HOSPITAL_COMMUNITY): Payer: Self-pay | Admitting: Physician Assistant

## 2017-09-29 ENCOUNTER — Other Ambulatory Visit (HOSPITAL_COMMUNITY): Payer: Self-pay | Admitting: Physician Assistant

## 2017-09-29 ENCOUNTER — Other Ambulatory Visit: Payer: Self-pay | Admitting: Physician Assistant

## 2017-09-29 DIAGNOSIS — R202 Paresthesia of skin: Secondary | ICD-10-CM

## 2017-10-01 ENCOUNTER — Ambulatory Visit (HOSPITAL_COMMUNITY)
Admission: RE | Admit: 2017-10-01 | Discharge: 2017-10-01 | Disposition: A | Discharge status: Home / Self Care | Payer: Medicare HMO | Source: Ambulatory Visit | Attending: Physician Assistant | Admitting: Physician Assistant

## 2017-10-01 DIAGNOSIS — R202 Paresthesia of skin: Secondary | ICD-10-CM | POA: Diagnosis not present

## 2017-10-07 ENCOUNTER — Emergency Department (HOSPITAL_COMMUNITY): Payer: Medicare HMO

## 2017-10-07 ENCOUNTER — Encounter (HOSPITAL_COMMUNITY): Payer: Self-pay | Admitting: Emergency Medicine

## 2017-10-07 ENCOUNTER — Emergency Department (HOSPITAL_COMMUNITY)
Admission: EM | Admit: 2017-10-07 | Discharge: 2017-10-07 | Disposition: A | Discharge status: Home / Self Care | Payer: Medicare HMO | Attending: Emergency Medicine | Admitting: Emergency Medicine

## 2017-10-07 ENCOUNTER — Other Ambulatory Visit: Payer: Self-pay

## 2017-10-07 DIAGNOSIS — Z79899 Other long term (current) drug therapy: Secondary | ICD-10-CM | POA: Diagnosis not present

## 2017-10-07 DIAGNOSIS — E119 Type 2 diabetes mellitus without complications: Secondary | ICD-10-CM | POA: Insufficient documentation

## 2017-10-07 DIAGNOSIS — I10 Essential (primary) hypertension: Secondary | ICD-10-CM | POA: Diagnosis not present

## 2017-10-07 DIAGNOSIS — R202 Paresthesia of skin: Secondary | ICD-10-CM | POA: Insufficient documentation

## 2017-10-07 DIAGNOSIS — R1013 Epigastric pain: Secondary | ICD-10-CM | POA: Diagnosis not present

## 2017-10-07 DIAGNOSIS — R1084 Generalized abdominal pain: Secondary | ICD-10-CM | POA: Diagnosis not present

## 2017-10-07 LAB — CBG MONITORING, ED: Glucose-Capillary: 113 mg/dL — ABNORMAL HIGH (ref 65–99)

## 2017-10-07 NOTE — ED Provider Notes (Signed)
[facility] EMERGENCY DEPARTMENT Provider Note   CSN: [id number] Arrival date & time: 10/07/17  0041  Time seen 12:58 AM   History   Chief Complaint Chief Complaint  Patient presents with  . Leg Pain    HPI Douglas Keller is a 77 y.o. male.  HPI patient states for the past 3 weeks he has felt like his right lower leg is cold.  When he went to bed tonight at midnight he could not get the leg warm.  He then started getting some reflux in his upper abdomen with a burning sensation.  He denies burning fluid in his throat.  He denies having any history of aneurysms.  He states he had a test done on the 15th to check blood flow in his leg however when he called his doctor's office this week he could never get anyone to answer the phone and give him the results.  He states his abdominal pain is gone now, he mainly gets it at night.  He also gets the leg coldness mainly at night.  He denies shortness of breath, nausea, or vomiting.  He states walking makes the numbness go away, laying down for bed at night makes it worse.  He denies any family history of aneurysms or amputations.  He denies a history of diabetes.  PCP Sharilyn Sites, MD   Past Medical History:  Diagnosis Date  . Aortic valve sclerosis   . Atherosclerosis of coronary artery without angina pectoris 07/05/2014  . Diabetes mellitus type 2, diet-controlled ([place])   . HTN (hypertension) 07/06/2013  . Hyperlipidemia 07/06/2013    Patient Active Problem List   Diagnosis Date Noted  . Hypercholesterolemia 08/21/2015  . Medication management 08/21/2015  . Atherosclerosis of coronary artery without angina pectoris 07/05/2014  . Stiffness of left shoulder joint 07/19/2013  . Hyperlipidemia 07/06/2013  . HTN (hypertension) 07/06/2013  . Cervical spondylosis without myelopathy 02/13/2013  . Degeneration of cervical intervertebral disc 02/13/2013  . Spinal stenosis in cervical region 02/13/2013    Past Surgical History:    Procedure Laterality Date  . CERVICAL SPINE SURGERY    . COLONOSCOPY  07/26/2012   Procedure: COLONOSCOPY;  Surgeon: Jamesetta So, MD;  Location: [facility];  Service: Gastroenterology;  Laterality: N/A;  . KNEE ARTHROSCOPY         Home Medications    Prior to Admission medications   Medication Sig Start Date End Date Taking? Authorizing Provider  amLODipine (NORVASC) 10 MG tablet Take 1 tablet (10 mg total) by mouth daily. 12/30/12   Croitoru, Mihai, MD  chlorthalidone (HYGROTON) 25 MG tablet Take 1 tablet (25 mg total) by mouth daily. Patient taking differently: Take 12.5 mg by mouth daily.  08/21/15   Croitoru, Mihai, MD  Coenzyme Q10 (COQ10) 100 MG CAPS Take 1 capsule by mouth daily.    [provider]  CRESTOR 20 MG tablet Take 1 tablet (20 mg total) by mouth daily. 09/02/17   Croitoru, Mihai, MD  GLUCOSAMINE PO Take 1 tablet by mouth at bedtime.    [provider]  KLOR-CON M20 20 MEQ tablet  07/29/17   [provider]  lisinopril (PRINIVIL,ZESTRIL) 40 MG tablet Take 1 tablet (40 mg total) by mouth daily. 12/30/12   Croitoru, Mihai, MD  Multiple Vitamin (MULITIVITAMIN WITH MINERALS) TABS Take 1 tablet by mouth daily.    [provider]  Multiple Vitamins-Minerals (AIRBORNE PO) Take 1 each by mouth daily.    [provider]  naproxen (  NAPROSYN) 375 MG tablet  07/28/17   [provider]    Family History Family History  Problem Relation Age of Onset  . Asthma Mother   . Hypertension Father   . Colon cancer Neg Hx     Social History Social History   Tobacco Use  . Smoking status: Never Smoker  . Smokeless tobacco: Never Used  Substance Use Topics  . Alcohol use: No  . Drug use: No     Allergies   Crestor [rosuvastatin]   Review of Systems Review of Systems  All other systems reviewed and are negative.    Physical Exam Updated Vital Signs BP (!) 148/81   Pulse 60   Temp 97.7 F (36.5 C)   Resp 18   Ht  5\' 6"  (1.676 m)   Wt 90.7 kg (200 lb)   SpO2 100%   BMI 32.28 kg/m   Physical Exam  Constitutional: He appears well-developed and well-nourished. No distress.  HENT:  Head: Normocephalic and atraumatic.  Right Ear: External ear normal.  Left Ear: External ear normal.  Eyes: Conjunctivae and EOM are normal.  Neck: Normal range of motion.  Cardiovascular: Regular rhythm.  Pulmonary/Chest: Effort normal. No respiratory distress.  Abdominal: He exhibits no distension and no mass. There is tenderness in the epigastric area. There is no rebound and no guarding.  Musculoskeletal: Normal range of motion. He exhibits no edema, tenderness or deformity.  When I feel the patient's legs on both sides and go down from his knee to his toes there is no difference in the temperature between the 2 legs and they were both warm.  He has strong dorsalis pedis, posterior tibialis, femoral and popliteal pulses.  The skin is normal in color bilaterally.  His toenails are thickened bilaterally and dark but they are the same on each side.  Nursing note and vitals reviewed.    ED Treatments / Results  Labs (all labs ordered are listed, but only abnormal results are displayed) Labs Reviewed - No data to display  EKG  EKG Interpretation None       Radiology Ct Abdomen Pelvis Wo Contrast  Result Date: 10/07/2017 CLINICAL DATA:  Generalized abdominal pain. EXAM: CT ABDOMEN AND PELVIS WITHOUT CONTRAST TECHNIQUE: Multidetector CT imaging of the abdomen and pelvis was performed following the standard protocol without IV contrast. COMPARISON:  None. FINDINGS: Lower chest: The lung bases are clear. There are coronary artery calcifications. Hepatobiliary: No focal liver abnormality is seen. No gallstones, gallbladder wall thickening, or biliary dilatation. Pancreas: No ductal dilatation or inflammation. Spleen: Normal in size without focal abnormality. Adrenals/Urinary Tract: Normal adrenal glands. No  hydronephrosis or urolithiasis. Mild symmetric perinephric edema. Ureters are decompressed. Urinary bladder is partially distended. No bladder wall thickening. Stomach/Bowel: Stomach is within normal limits. Appendix appears normal. No evidence of bowel wall thickening, distention, or inflammatory changes. Vascular/Lymphatic: Aorto bi-iliac atherosclerosis. No aneurysm. No enlarged abdominal or pelvic lymph nodes. Reproductive: Prominent prostate gland. Other: No free air, free fluid, or intra-abdominal fluid collection. Tiny fat containing umbilical hernia. Musculoskeletal: Facet arthropathy in the lumbar spine. There are no acute or suspicious osseous abnormalities. IMPRESSION: 1. Bilateral perinephric edema is likely chronic, however recommend correlation with urinalysis. No hydronephrosis or obstructive uropathy. 2. Otherwise no acute abnormality. 3. Incidental enlarged prostate gland. 4.  Aortic Atherosclerosis (ICD10-I70.0). Electronically Signed   By: Jeb Levering M.D.   On: 10/07/2017 03:03   October 01, 2017 CLINICAL DATA:  Right leg paresthesia  EXAM: NONINVASIVE PHYSIOLOGIC  VASCULAR STUDY OF BILATERAL LOWER EXTREMITIES  TECHNIQUE: Evaluation of both lower extremities were performed at rest, including calculation of ankle-brachial indices with single level Doppler, pressure and pulse volume recording.  COMPARISON:  None.  FINDINGS: Right ABI:  1.19  Left ABI:  1.23  Right Lower Extremity:  Normal arterial waveforms at the ankle.  Left Lower Extremity:  Normal arterial waveforms at the ankle.  IMPRESSION: No significant arterial occlusive disease in the lower extremities.   Electronically Signed   By: Marybelle Killings M.D.   On: 10/01/2017 13:03   Procedures Procedures (including critical care time)  Medications Ordered in ED Medications - No data to display   Initial Impression / Assessment and Plan / ED Course  I have reviewed the triage vital signs and  the nursing notes.  Pertinent labs & imaging results that were available during my care of the patient were reviewed by me and considered in my medical decision making (see chart for details).     Patient's history was concerning for possible arterial insufficiency in his right leg and with the epigastric abdominal pain concern was for a abdominal aortic aneurysm.  CT of the abdomen was done.  I was able to find his arterial ultrasound study done on the 15th and it was normal which corresponds with his normal pulses by exam.  Patient's abdominal CT does not show any AAA.  He will be referred back to his PCP for further evaluation of the feeling of coldness in his leg.  He may have a neuropathy or radiculopathy.  Final Clinical Impressions(s) / ED Diagnoses   Final diagnoses:  Paresthesia of right lower extremity    ED Discharge Orders    None      Plan discharge  Rolland Porter, MD, Barbette Or, MD 10/07/17 [id number]

## 2017-10-07 NOTE — Discharge Instructions (Addendum)
Please follow-up with your primary care doctor for further evaluation of the abnormal sensation in your right leg.

## 2017-10-07 NOTE — ED Triage Notes (Addendum)
Pt states he had ultrasound of bilateral legs last week and called pcp to get results but was unable. Pt c/o right leg pain and states it feels cold on the inside.

## 2017-10-14 DIAGNOSIS — M5136 Other intervertebral disc degeneration, lumbar region: Secondary | ICD-10-CM | POA: Diagnosis not present

## 2017-10-14 DIAGNOSIS — Z6832 Body mass index (BMI) 32.0-32.9, adult: Secondary | ICD-10-CM | POA: Diagnosis not present

## 2017-10-14 DIAGNOSIS — M48062 Spinal stenosis, lumbar region with neurogenic claudication: Secondary | ICD-10-CM | POA: Diagnosis not present

## 2017-10-14 DIAGNOSIS — M47817 Spondylosis without myelopathy or radiculopathy, lumbosacral region: Secondary | ICD-10-CM | POA: Diagnosis not present

## 2017-10-14 DIAGNOSIS — M545 Low back pain: Secondary | ICD-10-CM | POA: Diagnosis not present

## 2017-10-14 DIAGNOSIS — R202 Paresthesia of skin: Secondary | ICD-10-CM | POA: Diagnosis not present

## 2017-10-29 ENCOUNTER — Emergency Department (HOSPITAL_COMMUNITY): Payer: Medicare HMO

## 2017-10-29 ENCOUNTER — Encounter (HOSPITAL_COMMUNITY): Payer: Self-pay

## 2017-10-29 ENCOUNTER — Emergency Department (HOSPITAL_COMMUNITY)
Admission: EM | Admit: 2017-10-29 | Discharge: 2017-10-29 | Disposition: A | Discharge status: Home / Self Care | Payer: Medicare HMO | Attending: Emergency Medicine | Admitting: Emergency Medicine

## 2017-10-29 DIAGNOSIS — I10 Essential (primary) hypertension: Secondary | ICD-10-CM | POA: Diagnosis not present

## 2017-10-29 DIAGNOSIS — E119 Type 2 diabetes mellitus without complications: Secondary | ICD-10-CM | POA: Diagnosis not present

## 2017-10-29 DIAGNOSIS — R55 Syncope and collapse: Secondary | ICD-10-CM | POA: Diagnosis not present

## 2017-10-29 DIAGNOSIS — Z79899 Other long term (current) drug therapy: Secondary | ICD-10-CM | POA: Insufficient documentation

## 2017-10-29 DIAGNOSIS — I2510 Atherosclerotic heart disease of native coronary artery without angina pectoris: Secondary | ICD-10-CM | POA: Diagnosis not present

## 2017-10-29 DIAGNOSIS — R4020 Unspecified coma: Secondary | ICD-10-CM | POA: Diagnosis not present

## 2017-10-29 LAB — CBC WITH DIFFERENTIAL/PLATELET
Basophils Absolute: 0 10*3/uL (ref 0.0–0.1)
Basophils Relative: 0 %
Eosinophils Absolute: 0.2 10*3/uL (ref 0.0–0.7)
Eosinophils Relative: 3 %
HCT: 42 % (ref 39.0–52.0)
Hemoglobin: 13.9 g/dL (ref 13.0–17.0)
Lymphocytes Relative: 38 %
Lymphs Abs: 2.5 10*3/uL (ref 0.7–4.0)
MCH: 30 pg (ref 26.0–34.0)
MCHC: 33.1 g/dL (ref 30.0–36.0)
MCV: 90.5 fL (ref 78.0–100.0)
Monocytes Absolute: 0.7 10*3/uL (ref 0.1–1.0)
Monocytes Relative: 11 %
Neutro Abs: 3.2 10*3/uL (ref 1.7–7.7)
Neutrophils Relative %: 48 %
Platelets: 261 10*3/uL (ref 150–400)
RBC: 4.64 MIL/uL (ref 4.22–5.81)
RDW: 12.7 % (ref 11.5–15.5)
WBC: 6.6 10*3/uL (ref 4.0–10.5)

## 2017-10-29 LAB — BASIC METABOLIC PANEL
Anion gap: 10 (ref 5–15)
BUN: 15 mg/dL (ref 6–20)
CO2: 26 mmol/L (ref 22–32)
Calcium: 8.7 mg/dL — ABNORMAL LOW (ref 8.9–10.3)
Chloride: 105 mmol/L (ref 101–111)
Creatinine, Ser: 1.08 mg/dL (ref 0.61–1.24)
GFR calc Af Amer: >60 mL/min (ref >60)
GFR calc non Af Amer: >60 mL/min (ref >60)
Glucose, Bld: 116 mg/dL — ABNORMAL HIGH (ref 65–99)
Potassium: 3 mmol/L — ABNORMAL LOW (ref 3.5–5.1)
Sodium: 141 mmol/L (ref 135–145)

## 2017-10-29 LAB — D-DIMER, QUANTITATIVE (NOT AT ARMC): D-Dimer, Quant: <0.27 ug/mL-FEU (ref 0.00–0.50)

## 2017-10-29 NOTE — ED Triage Notes (Signed)
Pt states he was in the bed and when he awoke and sat up, he felt like he passed out.  This happened x 2.  Pt states he feels dizzy, no pain or weakness or other complaints. Pt drove himself to the hospital

## 2017-10-29 NOTE — ED Provider Notes (Signed)
[facility] EMERGENCY DEPARTMENT Provider Note   CSN: [id number] Arrival date & time: 10/29/17  0325     History   Chief Complaint Chief Complaint  Patient presents with  . Loss of Consciousness    HPI Douglas Keller is a 77 y.o. male.  Patient presents to the ER because he believes he might have passed out.  Patient is a poor historian.  He reports that he felt like he was going to pass out while he was laying in bed tonight.  He sat up and then felt like he was going to pass out.  It is not clear if he actually passed out.  Reports that he did get up and walk around a little bit, then sat back down on the bed and thinks he might of passed out again.  He did not fall or injure himself.  He did not have any chest pain, shortness of breath, heart palpitations.  He has not had any recent illness.  He is without complaints currently, reports that he feels like his normal baseline.     Past Medical History:  Diagnosis Date  . Aortic valve sclerosis   . Atherosclerosis of coronary artery without angina pectoris 07/05/2014  . Diabetes mellitus type 2, diet-controlled ([place])   . HTN (hypertension) 07/06/2013  . Hyperlipidemia 07/06/2013    Patient Active Problem List   Diagnosis Date Noted  . Hypercholesterolemia 08/21/2015  . Medication management 08/21/2015  . Atherosclerosis of coronary artery without angina pectoris 07/05/2014  . Stiffness of left shoulder joint 07/19/2013  . Hyperlipidemia 07/06/2013  . HTN (hypertension) 07/06/2013  . Cervical spondylosis without myelopathy 02/13/2013  . Degeneration of cervical intervertebral disc 02/13/2013  . Spinal stenosis in cervical region 02/13/2013    Past Surgical History:  Procedure Laterality Date  . CERVICAL SPINE SURGERY    . COLONOSCOPY  07/26/2012   Procedure: COLONOSCOPY;  Surgeon: Jamesetta So, MD;  Location: [facility];  Service: Gastroenterology;  Laterality: N/A;  . KNEE ARTHROSCOPY          Home  Medications    Prior to Admission medications   Medication Sig Start Date End Date Taking? Authorizing Provider  amLODipine (NORVASC) 10 MG tablet Take 1 tablet (10 mg total) by mouth daily. 12/30/12   Croitoru, Mihai, MD  chlorthalidone (HYGROTON) 25 MG tablet Take 1 tablet (25 mg total) by mouth daily. Patient taking differently: Take 12.5 mg by mouth daily.  08/21/15   Croitoru, Mihai, MD  Coenzyme Q10 (COQ10) 100 MG CAPS Take 1 capsule by mouth daily.    [provider]  CRESTOR 20 MG tablet Take 1 tablet (20 mg total) by mouth daily. 09/02/17   Croitoru, Mihai, MD  GLUCOSAMINE PO Take 1 tablet by mouth at bedtime.    [provider]  KLOR-CON M20 20 MEQ tablet  07/29/17   [provider]  lisinopril (PRINIVIL,ZESTRIL) 40 MG tablet Take 1 tablet (40 mg total) by mouth daily. 12/30/12   Croitoru, Mihai, MD  Multiple Vitamin (MULITIVITAMIN WITH MINERALS) TABS Take 1 tablet by mouth daily.    [provider]  Multiple Vitamins-Minerals (AIRBORNE PO) Take 1 each by mouth daily.    [provider]  naproxen (NAPROSYN) 375 MG tablet  07/28/17   [provider]    Family History Family History  Problem Relation Age of Onset  . Asthma Mother   . Hypertension Father   . Colon cancer Neg Hx     Social History  Social History   Tobacco Use  . Smoking status: Never Smoker  . Smokeless tobacco: Never Used  Substance Use Topics  . Alcohol use: No  . Drug use: No     Allergies   Crestor [rosuvastatin]   Review of Systems Review of Systems  Neurological: Positive for syncope (?).  All other systems reviewed and are negative.    Physical Exam Updated Vital Signs BP 120/67   Pulse (!) 48   Temp 97.8 F (36.6 C) (Oral)   Resp 15   Ht 5\' 6"  (1.676 m)   Wt 90.7 kg (200 lb)   SpO2 97%   BMI 32.28 kg/m   Physical Exam  Constitutional: He is oriented to person, place, and time. He appears well-developed and well-nourished. No  distress.  HENT:  Head: Normocephalic and atraumatic.  Right Ear: Hearing normal.  Left Ear: Hearing normal.  Nose: Nose normal.  Mouth/Throat: Oropharynx is clear and moist and mucous membranes are normal.  Eyes: Pupils are equal, round, and reactive to light. Conjunctivae and EOM are normal.  Neck: Normal range of motion. Neck supple.  Cardiovascular: Regular rhythm, S1 normal and S2 normal. Exam reveals no gallop and no friction rub.  No murmur heard. Pulmonary/Chest: Effort normal and breath sounds normal. No respiratory distress. He exhibits no tenderness.  Abdominal: Soft. Normal appearance and bowel sounds are normal. There is no hepatosplenomegaly. There is no tenderness. There is no rebound, no guarding, no tenderness at McBurney's point and negative Murphy's sign. No hernia.  Musculoskeletal: Normal range of motion.  Neurological: He is alert and oriented to person, place, and time. He has normal strength. No cranial nerve deficit or sensory deficit. Coordination normal. GCS eye subscore is 4. GCS verbal subscore is 5. GCS motor subscore is 6.  Skin: Skin is warm, dry and intact. No rash noted. No cyanosis.  Psychiatric: He has a normal mood and affect. His speech is normal and behavior is normal. Thought content normal.  Nursing note and vitals reviewed.    ED Treatments / Results  Labs (all labs ordered are listed, but only abnormal results are displayed) Labs Reviewed  BASIC METABOLIC PANEL - Abnormal; Notable for the following components:      Result Value   Potassium 3.0 (*)    Glucose, Bld 116 (*)    Calcium 8.7 (*)    All other components within normal limits  CBC WITH DIFFERENTIAL/PLATELET  D-DIMER, QUANTITATIVE (NOT AT [facility])    EKG EKG Interpretation  Date/Time:  Friday October 29 2017 03:34:18 EDT Ventricular Rate:  53 PR Interval:    QRS Duration: 111 QT Interval:  449 QTC Calculation: 422 R Axis:   -45 Text Interpretation:  Sinus rhythm Left anterior  fascicular block Consider anterior infarct No significant change since last tracing Confirmed by Orpah Greek [phone]) on 10/29/2017 3:37:51 AM   Radiology Ct Head Wo Contrast  Result Date: 10/29/2017 CLINICAL DATA:  Altered level of consciousness. EXAM: CT HEAD WITHOUT CONTRAST TECHNIQUE: Contiguous axial images were obtained from the base of the skull through the vertex without intravenous contrast. COMPARISON:  None. FINDINGS: Brain: Normal for age atrophy and minimal chronic small vessel ischemia. No intracranial hemorrhage, mass effect, or midline shift. No hydrocephalus. The basilar cisterns are patent. No evidence of territorial infarct or acute ischemia. No extra-axial or intracranial fluid collection. Vascular: No hyperdense vessel or unexpected calcification. Skull: No fracture or focal lesion. Sinuses/Orbits: Paranasal sinuses and mastoid air cells are clear. The visualized  orbits are unremarkable. Other: None. IMPRESSION: Normal noncontrast head CT for age. Electronically Signed   By: Jeb Levering M.D.   On: 10/29/2017 04:32    Procedures Procedures (including critical care time)  Medications Ordered in ED Medications - No data to display   Initial Impression / Assessment and Plan / ED Course  I have reviewed the triage vital signs and the nursing notes.  Pertinent labs & imaging results that were available during my care of the patient were reviewed by me and considered in my medical decision making (see chart for details).     Presentation is confusing.  He reports that he thinks he might of passed out.  He says he sat up on the edge of the bed because he felt he was going to pass out, but this occurred from being sound asleep.  He is not sure if he actually passed out.  It sounds like he might of falling asleep.  He had no symptoms before or after.  He appears well and at his normal baseline at arrival.  Vital signs are normal.  His workup has been unremarkable.   No arrhythmia.  EKG unchanged from previous.  Head CT unremarkable.  His neurologic exam is normal.  D-dimer is negative.  He is not expensing shortness of breath.  No hypoxia or tachypnea.  Doubt PE.  Patient is borderline bradycardic, however reviewing all of his previous office visits and ER visits, his heart rate is in the range that it generally is.  No noted arrhythmia here in the ER, patient felt to be safe for discharge.  At this point I doubt that he actually had a syncopal episode.  He is not sure either.  Patient referred back to his primary care doctor for recheck.  Final Clinical Impressions(s) / ED Diagnoses   Final diagnoses:  Near syncope    ED Discharge Orders    None       Larose Batres, Gwenyth Allegra, MD 10/29/17 [phone]

## 2017-10-29 NOTE — ED Notes (Signed)
Pt returned from CT Scan 

## 2017-10-29 NOTE — ED Notes (Signed)
Patient transported to CT 

## 2017-12-14 DIAGNOSIS — Z6831 Body mass index (BMI) 31.0-31.9, adult: Secondary | ICD-10-CM | POA: Diagnosis not present

## 2017-12-14 DIAGNOSIS — M48061 Spinal stenosis, lumbar region without neurogenic claudication: Secondary | ICD-10-CM | POA: Diagnosis not present

## 2017-12-14 DIAGNOSIS — Z1389 Encounter for screening for other disorder: Secondary | ICD-10-CM | POA: Diagnosis not present

## 2017-12-14 DIAGNOSIS — E6609 Other obesity due to excess calories: Secondary | ICD-10-CM | POA: Diagnosis not present

## 2017-12-14 DIAGNOSIS — E119 Type 2 diabetes mellitus without complications: Secondary | ICD-10-CM | POA: Diagnosis not present

## 2017-12-14 DIAGNOSIS — Z0001 Encounter for general adult medical examination with abnormal findings: Secondary | ICD-10-CM | POA: Diagnosis not present

## 2017-12-14 DIAGNOSIS — Z0000 Encounter for general adult medical examination without abnormal findings: Secondary | ICD-10-CM | POA: Diagnosis not present

## 2017-12-27 DIAGNOSIS — M48061 Spinal stenosis, lumbar region without neurogenic claudication: Secondary | ICD-10-CM | POA: Diagnosis not present

## 2018-01-27 DIAGNOSIS — M5412 Radiculopathy, cervical region: Secondary | ICD-10-CM | POA: Diagnosis not present

## 2018-01-27 DIAGNOSIS — I10 Essential (primary) hypertension: Secondary | ICD-10-CM | POA: Diagnosis not present

## 2018-01-27 DIAGNOSIS — M48061 Spinal stenosis, lumbar region without neurogenic claudication: Secondary | ICD-10-CM | POA: Diagnosis not present

## 2018-01-27 DIAGNOSIS — M5416 Radiculopathy, lumbar region: Secondary | ICD-10-CM | POA: Diagnosis not present

## 2018-02-01 DIAGNOSIS — R5383 Other fatigue: Secondary | ICD-10-CM | POA: Diagnosis not present

## 2018-02-01 DIAGNOSIS — E6609 Other obesity due to excess calories: Secondary | ICD-10-CM | POA: Diagnosis not present

## 2018-02-01 DIAGNOSIS — H539 Unspecified visual disturbance: Secondary | ICD-10-CM | POA: Diagnosis not present

## 2018-02-01 DIAGNOSIS — Z6831 Body mass index (BMI) 31.0-31.9, adult: Secondary | ICD-10-CM | POA: Diagnosis not present

## 2018-02-10 DIAGNOSIS — H8109 Meniere's disease, unspecified ear: Secondary | ICD-10-CM | POA: Diagnosis not present

## 2018-02-10 DIAGNOSIS — G4700 Insomnia, unspecified: Secondary | ICD-10-CM | POA: Diagnosis not present

## 2018-02-10 DIAGNOSIS — E669 Obesity, unspecified: Secondary | ICD-10-CM | POA: Diagnosis not present

## 2018-02-10 DIAGNOSIS — H8113 Benign paroxysmal vertigo, bilateral: Secondary | ICD-10-CM | POA: Diagnosis not present

## 2018-02-10 DIAGNOSIS — H9313 Tinnitus, bilateral: Secondary | ICD-10-CM | POA: Diagnosis not present

## 2018-02-10 DIAGNOSIS — I2510 Atherosclerotic heart disease of native coronary artery without angina pectoris: Secondary | ICD-10-CM | POA: Diagnosis not present

## 2018-02-10 DIAGNOSIS — Z6831 Body mass index (BMI) 31.0-31.9, adult: Secondary | ICD-10-CM | POA: Diagnosis not present

## 2018-02-10 DIAGNOSIS — I10 Essential (primary) hypertension: Secondary | ICD-10-CM | POA: Diagnosis not present

## 2018-02-10 DIAGNOSIS — R42 Dizziness and giddiness: Secondary | ICD-10-CM | POA: Diagnosis not present

## 2018-02-11 DIAGNOSIS — R55 Syncope and collapse: Secondary | ICD-10-CM | POA: Diagnosis not present

## 2018-02-11 DIAGNOSIS — E6609 Other obesity due to excess calories: Secondary | ICD-10-CM | POA: Diagnosis not present

## 2018-02-11 DIAGNOSIS — M542 Cervicalgia: Secondary | ICD-10-CM | POA: Diagnosis not present

## 2018-02-11 DIAGNOSIS — R42 Dizziness and giddiness: Secondary | ICD-10-CM | POA: Diagnosis not present

## 2018-02-11 DIAGNOSIS — Z6831 Body mass index (BMI) 31.0-31.9, adult: Secondary | ICD-10-CM | POA: Diagnosis not present

## 2018-02-11 DIAGNOSIS — E119 Type 2 diabetes mellitus without complications: Secondary | ICD-10-CM | POA: Diagnosis not present

## 2018-02-14 ENCOUNTER — Other Ambulatory Visit: Payer: Self-pay | Admitting: Cardiovascular Disease

## 2018-02-14 NOTE — Telephone Encounter (Signed)
New Message     *STAT* If patient is at the pharmacy, call can be transferred to refill team.   1. Which medications need to be refilled? (please list name of each medication and dose if known) chlorthalidone (HYGROTON) 25 MG tablet  2. Which pharmacy/location (including street and city if local pharmacy) is medication to be sent to? [place]  3. Do they need a 30 day or 90 day supply? [place]

## 2018-02-15 MED ORDER — CHLORTHALIDONE 25 MG PO TABS: 12.5000 mg | ORAL_TABLET | Freq: Every day | ORAL | 3 refills | Status: DC

## 2018-02-15 NOTE — Telephone Encounter (Signed)
Rx sent in to [facility].

## 2018-02-16 ENCOUNTER — Other Ambulatory Visit (HOSPITAL_COMMUNITY): Payer: Self-pay | Admitting: Internal Medicine

## 2018-02-16 DIAGNOSIS — H814 Vertigo of central origin: Secondary | ICD-10-CM

## 2018-02-17 ENCOUNTER — Emergency Department (HOSPITAL_COMMUNITY)
Admission: EM | Admit: 2018-02-17 | Discharge: 2018-02-18 | Disposition: A | Discharge status: Home / Self Care | Payer: Medicare HMO | Attending: Emergency Medicine | Admitting: Emergency Medicine

## 2018-02-17 ENCOUNTER — Other Ambulatory Visit: Payer: Self-pay

## 2018-02-17 ENCOUNTER — Encounter (HOSPITAL_COMMUNITY): Payer: Self-pay | Admitting: Emergency Medicine

## 2018-02-17 DIAGNOSIS — E119 Type 2 diabetes mellitus without complications: Secondary | ICD-10-CM | POA: Diagnosis not present

## 2018-02-17 DIAGNOSIS — I10 Essential (primary) hypertension: Secondary | ICD-10-CM | POA: Insufficient documentation

## 2018-02-17 DIAGNOSIS — Z79899 Other long term (current) drug therapy: Secondary | ICD-10-CM | POA: Insufficient documentation

## 2018-02-17 DIAGNOSIS — R55 Syncope and collapse: Secondary | ICD-10-CM | POA: Insufficient documentation

## 2018-02-17 DIAGNOSIS — R42 Dizziness and giddiness: Secondary | ICD-10-CM | POA: Insufficient documentation

## 2018-02-17 NOTE — ED Triage Notes (Signed)
Pt c/o spells of dizziness since having neck surgery in 2014. Pt states he has an appt with neurologist this week.

## 2018-02-17 NOTE — ED Notes (Signed)
ED Provider at bedside. 

## 2018-02-17 NOTE — ED Notes (Signed)
Pt states he always feels dizzy and has an appt with Neuro next week. Pt denies falling, syncope, N/V.

## 2018-02-17 NOTE — ED Provider Notes (Signed)
[facility] EMERGENCY DEPARTMENT Provider Note   CSN: [id number] Arrival date & time: 02/17/18  2157     History   Chief Complaint Chief Complaint  Patient presents with  . Dizziness    HPI Douglas Keller is a 77 y.o. male.  Patient presents to the emergency department for evaluation of dizziness and near syncope.  He reports that he has had ongoing problems with this for the last couple of years.  He was initially diagnosed with vertigo, but went to vestibular rehab and was told that he does not have BPPV or any other form of peripheral vertigo.  Tonight he bent over to put a leash on his dog, stood up and felt like the world was spinning around him.  He staggered around for a while, had to sit down.  He felt like he was going to pass out but did not.  After sitting for some time, symptoms resolved and he is back to normal now.  No headache or vision change.  No chest pain, shortness of breath, palpitations.     Past Medical History:  Diagnosis Date  . Aortic valve sclerosis   . Atherosclerosis of coronary artery without angina pectoris 07/05/2014  . Diabetes mellitus type 2, diet-controlled ([place])   . HTN (hypertension) 07/06/2013  . Hyperlipidemia 07/06/2013    Patient Active Problem List   Diagnosis Date Noted  . Hypercholesterolemia 08/21/2015  . Medication management 08/21/2015  . Atherosclerosis of coronary artery without angina pectoris 07/05/2014  . Stiffness of left shoulder joint 07/19/2013  . Hyperlipidemia 07/06/2013  . HTN (hypertension) 07/06/2013  . Cervical spondylosis without myelopathy 02/13/2013  . Degeneration of cervical intervertebral disc 02/13/2013  . Spinal stenosis in cervical region 02/13/2013    Past Surgical History:  Procedure Laterality Date  . CERVICAL SPINE SURGERY    . COLONOSCOPY  07/26/2012   Procedure: COLONOSCOPY;  Surgeon: Jamesetta So, MD;  Location: [facility];  Service: Gastroenterology;  Laterality: N/A;  . KNEE  ARTHROSCOPY          Home Medications    Prior to Admission medications   Medication Sig Start Date End Date Taking? Authorizing Provider  amLODipine (NORVASC) 10 MG tablet Take 1 tablet (10 mg total) by mouth daily. 12/30/12   Croitoru, Mihai, MD  chlorthalidone (HYGROTON) 25 MG tablet Take 0.5 tablets (12.5 mg total) by mouth daily. 02/15/18   Croitoru, Mihai, MD  Coenzyme Q10 (COQ10) 100 MG CAPS Take 1 capsule by mouth daily.    [provider]  CRESTOR 20 MG tablet Take 1 tablet (20 mg total) by mouth daily. 09/02/17   Croitoru, Mihai, MD  GLUCOSAMINE PO Take 1 tablet by mouth at bedtime.    [provider]  KLOR-CON M20 20 MEQ tablet  07/29/17   [provider]  lisinopril (PRINIVIL,ZESTRIL) 40 MG tablet Take 1 tablet (40 mg total) by mouth daily. 12/30/12   Croitoru, Mihai, MD  Multiple Vitamin (MULITIVITAMIN WITH MINERALS) TABS Take 1 tablet by mouth daily.    [provider]  Multiple Vitamins-Minerals (AIRBORNE PO) Take 1 each by mouth daily.    [provider]  naproxen (NAPROSYN) 375 MG tablet  07/28/17   [provider]    Family History Family History  Problem Relation Age of Onset  . Asthma Mother   . Hypertension Father   . Colon cancer Neg Hx     Social History Social History   Tobacco Use  . Smoking status: Never Smoker  .  Smokeless tobacco: Never Used  Substance Use Topics  . Alcohol use: No  . Drug use: No     Allergies   Crestor [rosuvastatin]   Review of Systems Review of Systems  Neurological: Positive for dizziness.  All other systems reviewed and are negative.    Physical Exam Updated Vital Signs BP 125/75   Pulse (!) 50   Temp 98 F (36.7 C)   Resp (!) 21   Ht 5\' 6"  (1.676 m)   Wt 90.7 kg (200 lb)   SpO2 96%   BMI 32.28 kg/m   Physical Exam  Constitutional: He is oriented to person, place, and time. He appears well-developed and well-nourished. No distress.  HENT:  Head:  Normocephalic and atraumatic.  Right Ear: Hearing normal.  Left Ear: Hearing normal.  Nose: Nose normal.  Mouth/Throat: Oropharynx is clear and moist and mucous membranes are normal.  Eyes: Pupils are equal, round, and reactive to light. Conjunctivae and EOM are normal.  Neck: Normal range of motion. Neck supple.  Cardiovascular: Regular rhythm, S1 normal and S2 normal. Exam reveals no gallop and no friction rub.  No murmur heard. Pulmonary/Chest: Effort normal and breath sounds normal. No respiratory distress. He exhibits no tenderness.  Abdominal: Soft. Normal appearance and bowel sounds are normal. There is no hepatosplenomegaly. There is no tenderness. There is no rebound, no guarding, no tenderness at McBurney's point and negative Murphy's sign. No hernia.  Musculoskeletal: Normal range of motion.  Neurological: He is alert and oriented to person, place, and time. He has normal strength. No cranial nerve deficit or sensory deficit. Coordination normal. GCS eye subscore is 4. GCS verbal subscore is 5. GCS motor subscore is 6.  Extraocular muscle movement: normal No visual field cut Pupils: equal and reactive both direct and consensual response is normal No nystagmus present    Sensory function is intact to light touch, pinprick Proprioception intact  Grip strength 5/5 symmetric in upper extremities No pronator drift Normal finger to nose bilaterally  Lower extremity strength 5/5 against gravity Normal heel to shin bilaterally  Gait: normal   Skin: Skin is warm, dry and intact. No rash noted. No cyanosis.  Psychiatric: He has a normal mood and affect. His speech is normal and behavior is normal. Thought content normal.  Nursing note and vitals reviewed.    ED Treatments / Results  Labs (all labs ordered are listed, but only abnormal results are displayed) Labs Reviewed  CBC WITH DIFFERENTIAL/PLATELET - Abnormal; Notable for the following components:      Result Value    Monocytes Absolute 1.1 (*)    All other components within normal limits  BASIC METABOLIC PANEL - Abnormal; Notable for the following components:   Potassium 3.2 (*)    Glucose, Bld 112 (*)    Calcium 8.6 (*)    All other components within normal limits  I-STAT TROPONIN, ED    EKG EKG Interpretation  Date/Time:  Friday February 18 2018 00:04:34 EDT Ventricular Rate:  52 PR Interval:    QRS Duration: 109 QT Interval:  423 QTC Calculation: 394 R Axis:   -21 Text Interpretation:  Sinus rhythm Borderline left axis deviation Consider anterior infarct Confirmed by Orpah Greek [phone]) on 02/18/2018 12:11:46 AM   Radiology Ct Angio Head W Or Wo Contrast  Result Date: 02/18/2018 CLINICAL DATA:  Intermittent dizziness since neck surgery 2014. EXAM: CT ANGIOGRAPHY HEAD AND NECK TECHNIQUE: Multidetector CT imaging of the head and neck was performed using the  standard protocol during bolus administration of intravenous contrast. Multiplanar CT image reconstructions and MIPs were obtained to evaluate the vascular anatomy. Carotid stenosis measurements (when applicable) are obtained utilizing NASCET criteria, using the distal internal carotid diameter as the denominator. CONTRAST:  57mL ISOVUE-370 IOPAMIDOL (ISOVUE-370) INJECTION 76% COMPARISON:  CT HEAD October 29, 2017 FINDINGS: CT HEAD FINDINGS BRAIN: No intraparenchymal hemorrhage, mass effect nor midline shift. The ventricles and sulci are normal for age. Faint supratentorial white matter hypodensities less than expected for patient's age, though non-specific are most compatible with chronic small vessel ischemic disease. No acute large vascular territory infarcts. No abnormal extra-axial fluid collections. Basal cisterns are patent. VASCULAR: Mild calcific atherosclerosis of the carotid siphons. SKULL: No skull fracture. No significant scalp soft tissue swelling. Suboccipital scalp scarring. SINUSES/ORBITS: The mastoid air-cells and included  paranasal sinuses are well-aerated.The included ocular globes and orbital contents are non-suspicious. OTHER: None. CTA NECK FINDINGS: AORTIC ARCH: Normal appearance of the thoracic arch, normal branch pattern. Mild calcific atherosclerosis aortic arch. The origins of the innominate, left Common carotid artery and subclavian artery are widely patent. RIGHT CAROTID SYSTEM: Common carotid artery is patent, trace atherosclerosis. Mild calcific atherosclerosis carotid bifurcation without hemodynamically significant stenosis by NASCET criteria. Normal appearance of the internal carotid artery. LEFT CAROTID SYSTEM: Common carotid artery is patent. Mild calcific atherosclerosis carotid bifurcation without hemodynamically significant stenosis by NASCET criteria. Normal appearance of the internal carotid artery. VERTEBRAL ARTERIES:Codominant vertebral arteries. Patent vertebral arteries, mild calcific atherosclerosis or genes. SKELETON: No acute osseous process though bone windows have not been submitted. Congenital canal narrowing. Status post C3-4 through C6-7 posterior decompression with facet screws and posterior arthrodesis. Severe C3-4, C4-5, C5-6 neural foraminal narrowing. OTHER NECK: Soft tissues of the neck are nonacute though, not tailored for evaluation. UPPER CHEST: Included lung apices are clear. No superior mediastinal lymphadenopathy. CTA HEAD FINDINGS: ANTERIOR CIRCULATION: Patent cervical internal carotid arteries, petrous, cavernous and supra clinoid internal carotid arteries. Mild stenosis bilateral paraclinoid ICA. Patent anterior communicating artery. Patent anterior and middle cerebral arteries. No large vessel occlusion, significant stenosis, contrast extravasation or aneurysm. POSTERIOR CIRCULATION: Patent vertebral arteries, vertebrobasilar junction and basilar artery, as well as main branch vessels. Patent posterior cerebral arteries. No large vessel occlusion, significant stenosis, contrast  extravasation or aneurysm. VENOUS SINUSES: Major dural venous sinuses are patent though not tailored for evaluation on this angiographic examination. ANATOMIC VARIANTS: None. DELAYED PHASE: No abnormal intracranial enhancement. MIP images reviewed. IMPRESSION: CT HEAD: 1. Normal CT HEAD with and without contrast for age. CTA NECK: 1. No hemodynamically significant stenosis ICA's. Patent vertebral arteries. 2. Severe C3-4 through C5-6 neural foraminal narrowing. CTA HEAD: 1. No emergent large vessel occlusion or flow-limiting stenosis. 2. Mild ICA stenosis. Aortic Atherosclerosis (ICD10-I70.0). Electronically Signed   By: Elon Alas M.D.   On: 02/18/2018 01:31   Ct Angio Neck W And/or Wo Contrast  Result Date: 02/18/2018 CLINICAL DATA:  Intermittent dizziness since neck surgery 2014. EXAM: CT ANGIOGRAPHY HEAD AND NECK TECHNIQUE: Multidetector CT imaging of the head and neck was performed using the standard protocol during bolus administration of intravenous contrast. Multiplanar CT image reconstructions and MIPs were obtained to evaluate the vascular anatomy. Carotid stenosis measurements (when applicable) are obtained utilizing NASCET criteria, using the distal internal carotid diameter as the denominator. CONTRAST:  68mL ISOVUE-370 IOPAMIDOL (ISOVUE-370) INJECTION 76% COMPARISON:  CT HEAD October 29, 2017 FINDINGS: CT HEAD FINDINGS BRAIN: No intraparenchymal hemorrhage, mass effect nor midline shift. The ventricles and sulci are normal for  age. Faint supratentorial white matter hypodensities less than expected for patient's age, though non-specific are most compatible with chronic small vessel ischemic disease. No acute large vascular territory infarcts. No abnormal extra-axial fluid collections. Basal cisterns are patent. VASCULAR: Mild calcific atherosclerosis of the carotid siphons. SKULL: No skull fracture. No significant scalp soft tissue swelling. Suboccipital scalp scarring. SINUSES/ORBITS: The  mastoid air-cells and included paranasal sinuses are well-aerated.The included ocular globes and orbital contents are non-suspicious. OTHER: None. CTA NECK FINDINGS: AORTIC ARCH: Normal appearance of the thoracic arch, normal branch pattern. Mild calcific atherosclerosis aortic arch. The origins of the innominate, left Common carotid artery and subclavian artery are widely patent. RIGHT CAROTID SYSTEM: Common carotid artery is patent, trace atherosclerosis. Mild calcific atherosclerosis carotid bifurcation without hemodynamically significant stenosis by NASCET criteria. Normal appearance of the internal carotid artery. LEFT CAROTID SYSTEM: Common carotid artery is patent. Mild calcific atherosclerosis carotid bifurcation without hemodynamically significant stenosis by NASCET criteria. Normal appearance of the internal carotid artery. VERTEBRAL ARTERIES:Codominant vertebral arteries. Patent vertebral arteries, mild calcific atherosclerosis or genes. SKELETON: No acute osseous process though bone windows have not been submitted. Congenital canal narrowing. Status post C3-4 through C6-7 posterior decompression with facet screws and posterior arthrodesis. Severe C3-4, C4-5, C5-6 neural foraminal narrowing. OTHER NECK: Soft tissues of the neck are nonacute though, not tailored for evaluation. UPPER CHEST: Included lung apices are clear. No superior mediastinal lymphadenopathy. CTA HEAD FINDINGS: ANTERIOR CIRCULATION: Patent cervical internal carotid arteries, petrous, cavernous and supra clinoid internal carotid arteries. Mild stenosis bilateral paraclinoid ICA. Patent anterior communicating artery. Patent anterior and middle cerebral arteries. No large vessel occlusion, significant stenosis, contrast extravasation or aneurysm. POSTERIOR CIRCULATION: Patent vertebral arteries, vertebrobasilar junction and basilar artery, as well as main branch vessels. Patent posterior cerebral arteries. No large vessel occlusion,  significant stenosis, contrast extravasation or aneurysm. VENOUS SINUSES: Major dural venous sinuses are patent though not tailored for evaluation on this angiographic examination. ANATOMIC VARIANTS: None. DELAYED PHASE: No abnormal intracranial enhancement. MIP images reviewed. IMPRESSION: CT HEAD: 1. Normal CT HEAD with and without contrast for age. CTA NECK: 1. No hemodynamically significant stenosis ICA's. Patent vertebral arteries. 2. Severe C3-4 through C5-6 neural foraminal narrowing. CTA HEAD: 1. No emergent large vessel occlusion or flow-limiting stenosis. 2. Mild ICA stenosis. Aortic Atherosclerosis (ICD10-I70.0). Electronically Signed   By: Elon Alas M.D.   On: 02/18/2018 01:31    Procedures Procedures (including critical care time)  Medications Ordered in ED Medications  iopamidol (ISOVUE-370) 76 % injection 100 mL (75 mLs Intravenous Contrast Given 02/18/18 0047)     Initial Impression / Assessment and Plan / ED Course  I have reviewed the triage vital signs and the nursing notes.  Pertinent labs & imaging results that were available during my care of the patient were reviewed by me and considered in my medical decision making (see chart for details).     Patient presents to the emergency department for evaluation of dizziness and near syncope.  This has been ongoing problem for him.  He has been worked up by cardiology and ENT for this.  He reports that cardiac causes have been ruled out, as has peripheral vertigo.  He was scheduled for outpatient CT angiogram neck to rule out vertebral insufficiency.  I performed CT angios head and neck here in the ER and there is no acute abnormality noted.  Patient reassured, he has follow-up with neurology scheduled in 4 days.  He will discuss the symptoms with neurology as he  has not had a neurologic evaluation for the symptoms yet.  He is safe for discharge.  Final Clinical Impressions(s) / ED Diagnoses   Final diagnoses:  Dizziness    Near syncope    ED Discharge Orders    None       Pollina, Gwenyth Allegra, MD 02/18/18 [phone]

## 2018-02-18 ENCOUNTER — Emergency Department (HOSPITAL_COMMUNITY): Payer: Medicare HMO

## 2018-02-18 DIAGNOSIS — R42 Dizziness and giddiness: Secondary | ICD-10-CM | POA: Diagnosis not present

## 2018-02-18 LAB — CBC WITH DIFFERENTIAL/PLATELET
Basophils Absolute: 0 10*3/uL (ref 0.0–0.1)
Basophils Relative: 0 %
Eosinophils Absolute: 0.1 10*3/uL (ref 0.0–0.7)
Eosinophils Relative: 1 %
HCT: 42.2 % (ref 39.0–52.0)
Hemoglobin: 14.3 g/dL (ref 13.0–17.0)
Lymphocytes Relative: 26 %
Lymphs Abs: 2.5 10*3/uL (ref 0.7–4.0)
MCH: 31.2 pg (ref 26.0–34.0)
MCHC: 33.9 g/dL (ref 30.0–36.0)
MCV: 92.1 fL (ref 78.0–100.0)
Monocytes Absolute: 1.1 10*3/uL — ABNORMAL HIGH (ref 0.1–1.0)
Monocytes Relative: 12 %
Neutro Abs: 5.7 10*3/uL (ref 1.7–7.7)
Neutrophils Relative %: 61 %
Platelets: 279 10*3/uL (ref 150–400)
RBC: 4.58 MIL/uL (ref 4.22–5.81)
RDW: 13.7 % (ref 11.5–15.5)
WBC: 9.4 10*3/uL (ref 4.0–10.5)

## 2018-02-18 LAB — BASIC METABOLIC PANEL
Anion gap: 8 (ref 5–15)
BUN: 15 mg/dL (ref 8–23)
CO2: 26 mmol/L (ref 22–32)
Calcium: 8.6 mg/dL — ABNORMAL LOW (ref 8.9–10.3)
Chloride: 103 mmol/L (ref 98–111)
Creatinine, Ser: 1.07 mg/dL (ref 0.61–1.24)
GFR calc Af Amer: >60 mL/min (ref >60)
GFR calc non Af Amer: >60 mL/min (ref >60)
Glucose, Bld: 112 mg/dL — ABNORMAL HIGH (ref 70–99)
Potassium: 3.2 mmol/L — ABNORMAL LOW (ref 3.5–5.1)
Sodium: 137 mmol/L (ref 135–145)

## 2018-02-18 LAB — I-STAT TROPONIN, ED: Troponin i, poc: 0 ng/mL (ref 0.00–0.08)

## 2018-02-18 MED ORDER — IOPAMIDOL (ISOVUE-370) INJECTION 76%
100.0000 mL | Freq: Once | INTRAVENOUS | Status: AC | PRN
  Administered 2018-02-18: 75 mL via INTRAVENOUS

## 2018-02-18 NOTE — ED Notes (Signed)
ED Provider at bedside. 

## 2018-02-22 DIAGNOSIS — M5412 Radiculopathy, cervical region: Secondary | ICD-10-CM | POA: Diagnosis not present

## 2018-03-01 DIAGNOSIS — E782 Mixed hyperlipidemia: Secondary | ICD-10-CM | POA: Diagnosis not present

## 2018-03-01 DIAGNOSIS — Z6831 Body mass index (BMI) 31.0-31.9, adult: Secondary | ICD-10-CM | POA: Diagnosis not present

## 2018-03-01 DIAGNOSIS — E6609 Other obesity due to excess calories: Secondary | ICD-10-CM | POA: Diagnosis not present

## 2018-03-01 DIAGNOSIS — E119 Type 2 diabetes mellitus without complications: Secondary | ICD-10-CM | POA: Diagnosis not present

## 2018-03-01 DIAGNOSIS — I10 Essential (primary) hypertension: Secondary | ICD-10-CM | POA: Diagnosis not present

## 2018-03-01 DIAGNOSIS — H6592 Unspecified nonsuppurative otitis media, left ear: Secondary | ICD-10-CM | POA: Diagnosis not present

## 2018-03-01 DIAGNOSIS — R55 Syncope and collapse: Secondary | ICD-10-CM | POA: Diagnosis not present

## 2018-03-02 ENCOUNTER — Telehealth: Payer: Self-pay | Admitting: Cardiovascular Disease

## 2018-03-02 DIAGNOSIS — E119 Type 2 diabetes mellitus without complications: Secondary | ICD-10-CM | POA: Diagnosis not present

## 2018-03-02 DIAGNOSIS — R42 Dizziness and giddiness: Secondary | ICD-10-CM

## 2018-03-02 NOTE — Telephone Encounter (Signed)
Spoke with pt who states he is requesting a referral to [facility]. Routing to MD

## 2018-03-02 NOTE — Telephone Encounter (Signed)
New problem    Pt need a referral to [facility]. Please call pt to advise.

## 2018-03-03 NOTE — Telephone Encounter (Signed)
Referral has been sent to [facility] for Vertigo/diizziness. Pt made aware.

## 2018-03-03 NOTE — Telephone Encounter (Signed)
Gladly - I assume for vertigo/dizziness, correct? MCr

## 2018-03-04 ENCOUNTER — Ambulatory Visit (HOSPITAL_COMMUNITY): Payer: Medicare HMO

## 2018-03-17 DIAGNOSIS — R42 Dizziness and giddiness: Secondary | ICD-10-CM | POA: Diagnosis not present

## 2018-03-17 DIAGNOSIS — H9319 Tinnitus, unspecified ear: Secondary | ICD-10-CM | POA: Diagnosis not present

## 2018-03-24 DIAGNOSIS — M5412 Radiculopathy, cervical region: Secondary | ICD-10-CM | POA: Diagnosis not present

## 2018-03-24 DIAGNOSIS — I10 Essential (primary) hypertension: Secondary | ICD-10-CM | POA: Diagnosis not present

## 2018-03-24 DIAGNOSIS — E119 Type 2 diabetes mellitus without complications: Secondary | ICD-10-CM | POA: Diagnosis not present

## 2018-03-24 DIAGNOSIS — M48061 Spinal stenosis, lumbar region without neurogenic claudication: Secondary | ICD-10-CM | POA: Diagnosis not present

## 2018-03-24 DIAGNOSIS — E7849 Other hyperlipidemia: Secondary | ICD-10-CM | POA: Diagnosis not present

## 2018-03-24 DIAGNOSIS — G894 Chronic pain syndrome: Secondary | ICD-10-CM | POA: Diagnosis not present

## 2018-03-24 DIAGNOSIS — M5416 Radiculopathy, lumbar region: Secondary | ICD-10-CM | POA: Diagnosis not present

## 2018-03-24 DIAGNOSIS — R55 Syncope and collapse: Secondary | ICD-10-CM | POA: Diagnosis not present

## 2018-03-24 DIAGNOSIS — Z6831 Body mass index (BMI) 31.0-31.9, adult: Secondary | ICD-10-CM | POA: Diagnosis not present

## 2018-03-24 DIAGNOSIS — H9313 Tinnitus, bilateral: Secondary | ICD-10-CM | POA: Diagnosis not present

## 2018-03-24 DIAGNOSIS — I2510 Atherosclerotic heart disease of native coronary artery without angina pectoris: Secondary | ICD-10-CM | POA: Diagnosis not present

## 2018-03-24 DIAGNOSIS — H8103 Meniere's disease, bilateral: Secondary | ICD-10-CM | POA: Diagnosis not present

## 2018-04-26 DIAGNOSIS — M48061 Spinal stenosis, lumbar region without neurogenic claudication: Secondary | ICD-10-CM | POA: Diagnosis not present

## 2018-04-27 DIAGNOSIS — R42 Dizziness and giddiness: Secondary | ICD-10-CM | POA: Diagnosis not present

## 2018-04-29 ENCOUNTER — Other Ambulatory Visit: Payer: Self-pay | Admitting: Neurology

## 2018-04-29 DIAGNOSIS — R42 Dizziness and giddiness: Secondary | ICD-10-CM

## 2018-05-02 DIAGNOSIS — E6609 Other obesity due to excess calories: Secondary | ICD-10-CM | POA: Diagnosis not present

## 2018-05-02 DIAGNOSIS — Z6831 Body mass index (BMI) 31.0-31.9, adult: Secondary | ICD-10-CM | POA: Diagnosis not present

## 2018-05-02 DIAGNOSIS — Z308 Encounter for other contraceptive management: Secondary | ICD-10-CM | POA: Diagnosis not present

## 2018-05-02 DIAGNOSIS — I10 Essential (primary) hypertension: Secondary | ICD-10-CM | POA: Diagnosis not present

## 2018-05-03 ENCOUNTER — Other Ambulatory Visit (HOSPITAL_COMMUNITY): Payer: Self-pay | Admitting: Neurology

## 2018-05-03 DIAGNOSIS — R42 Dizziness and giddiness: Secondary | ICD-10-CM

## 2018-05-05 ENCOUNTER — Telehealth: Payer: Self-pay | Admitting: Cardiovascular Disease

## 2018-05-05 DIAGNOSIS — E785 Hyperlipidemia, unspecified: Secondary | ICD-10-CM

## 2018-05-05 NOTE — Telephone Encounter (Signed)
Spoke with pt. Pt sts that he had been prescribed Crestor in the past but was unable to tolerate due to muscle pain. Dr.Croitoru wanted him to see the Pharmacist for Repatha consideration but the pt never did. Pt sts that he is concerned about his Cholesterol and would like to have the consult with a Pharm-D. Adv pt that I will fwd a message to scheduling to call him to schedule a Lipid clinic appt. Pt verbalized understanding.

## 2018-05-05 NOTE — Telephone Encounter (Signed)
New Message   Pt states he is having some issues with his cholesterol and would like to speak with a nurse. Please call

## 2018-05-06 ENCOUNTER — Encounter (HOSPITAL_COMMUNITY): Payer: Self-pay

## 2018-05-06 ENCOUNTER — Ambulatory Visit (HOSPITAL_COMMUNITY): Payer: Medicare HMO

## 2018-05-10 ENCOUNTER — Ambulatory Visit (INDEPENDENT_AMBULATORY_CARE_PROVIDER_SITE_OTHER): Payer: Medicare HMO | Admitting: Pharmacist

## 2018-05-10 DIAGNOSIS — E785 Hyperlipidemia, unspecified: Secondary | ICD-10-CM

## 2018-05-10 NOTE — Progress Notes (Signed)
[name]                 DOB: [birth year]                    MRN: [id number]     HPI: Douglas Keller is a 77 y.o. male patient referred to lipid clinic by Dr Sallyanne Kuster. 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 Patient reports compliance with all medication and muscle pain with statins. He was tolerating Crestor (brand name) without problems but his insurance stopped paying for it.  Current Medications:  Fish-oil 1gm daily  Intolerances:  Rosuvastatin 20mg  daily - cramps Atorvastatin 20mg  - cramps and joint pain Pravastatin 20mg  - cramps and joint pain  LDL goal: < 70mg /dL  Diet: mainly eat out; working on grill and baked   Exercise: gym 3x/week  Family History: Heart problems in father  Social History: no tobacco, no alcohol  Labs: 08/28/2017: CHO 251; TG 242; HDL 37; LDL-c 166   Past Medical History:  Diagnosis Date  . Aortic valve sclerosis   . Atherosclerosis of coronary artery without angina pectoris 07/05/2014  . Diabetes mellitus type 2, diet-controlled ([place])   . HTN (hypertension) 07/06/2013  . Hyperlipidemia 07/06/2013    Current Outpatient Medications on File Prior to Visit  Medication Sig Dispense Refill  . amLODipine (NORVASC) 10 MG tablet Take 1 tablet (10 mg total) by mouth daily. 30 tablet 6  . aspirin EC 81 MG tablet Take 81 mg by mouth daily.    . chlorthalidone (HYGROTON) 25 MG tablet Take 0.5 tablets (12.5 mg total) by mouth daily. 45 tablet 3  . GLUCOSAMINE PO Take 1 tablet by mouth at bedtime.    Marland Kitchen KLOR-CON M20 20 MEQ tablet     . lisinopril (PRINIVIL,ZESTRIL) 40 MG tablet Take 1 tablet (40 mg total) by mouth daily. 30 tablet 6  . Multiple Vitamin (MULITIVITAMIN WITH MINERALS) TABS Take 1 tablet by mouth daily.    . Multiple Vitamins-Minerals (AIRBORNE PO) Take 1 each by mouth daily.    . Omega-3 Fatty Acids (FISH OIL) 1000 MG CAPS Take 1 capsule by  mouth daily.     No current facility-administered medications on file prior to visit.     Allergies  Allergen Reactions  . Crestor [Rosuvastatin] Other (See Comments)    Cramps     Hyperlipidemia LDL extremely elevated for high-risk patient with PAD at 50% and moderate CAD. Patient well managed with brand name CRESTO with unable to continue to cost. Trial with rosuvastatin, atorvastatin and pravastatin lead to severe cramps and joint pain.   Repatha/Praleunt indication, MOA, common side effects, storage, administration, monitoring, prior-authorization, patient assistance program, and financial responsibility discussed during [place]. Will start paperwork for Repatha/Praleunt as soon as baseline lipid panel completed and follow up as needed. Plan to repeat fasting blood work 2-3 months after initiating new medication.    Miria Cappelli Rodriguez-Guzman PharmD, BCPS, [place] [address] 05/11/2018 10:47 AM

## 2018-05-10 NOTE — Patient Instructions (Addendum)
Lipid Clinic  (pharmacist) Zygmunt Mcglinn/Kristin [phone]  *START paperwork for Repatha/Praluent approval* *Repeat fasting blood work as soon as possible*    Cholesterol Cholesterol is a fat. Your body needs a small amount of cholesterol. Cholesterol (plaque) may build up in your blood vessels (arteries). That makes you more likely to have a heart attack or stroke. You cannot feel your cholesterol level. Having a blood test is the only way to find out if your level is high. Keep your test results. Work with your doctor to keep your cholesterol at a good level. What do the results mean?  Total cholesterol is how much cholesterol is in your blood.  LDL is bad cholesterol. This is the type that can build up. Try to have low LDL.  HDL is good cholesterol. It cleans your blood vessels and carries LDL away. Try to have high HDL.  Triglycerides are fat that the body can store or burn for energy. What are good levels of cholesterol?  Total cholesterol below 200.  LDL below 100 is good for people who have health risks. LDL below 70 is good for people who have very high risks.  HDL above 40 is good. It is best to have HDL of 60 or higher.  Triglycerides below 150. How can I lower my cholesterol? Diet Follow your diet program as told by your doctor.  Choose fish, white meat chicken, or [place] that is roasted or baked. Try not to eat red meat, fried foods, sausage, or lunch meats.  Eat lots of fresh fruits and vegetables.  Choose whole grains, beans, pasta, potatoes, and cereals.  Choose olive oil, corn oil, or canola oil. Only use small amounts.  Try not to eat butter, mayonnaise, shortening, or palm kernel oils.  Try not to eat foods with trans fats.  Choose low-fat or nonfat dairy foods. ? Drink skim or nonfat milk. ? Eat low-fat or nonfat yogurt and cheeses. ? Try not to drink whole milk or cream. ? Try not to eat ice cream, egg yolks, or full-fat cheeses.  Healthy desserts  include angel food cake, ginger snaps, animal crackers, hard candy, popsicles, and low-fat or nonfat frozen yogurt. Try not to eat pastries, cakes, pies, and cookies.  Exercise Follow your exercise program as told by your doctor.  Be more active. Try gardening, walking, and taking the stairs.  Ask your doctor about ways that you can be more active.  Medicine  Take over-the-counter and prescription medicines only as told by your doctor. This information is not intended to replace advice given to you by your health care provider. Make sure you discuss any questions you have with your health care provider. Document Released: 10/02/2008 Document Revised: 02/05/2016 Document Reviewed: 01/16/2016 Elsevier Interactive Patient Education  [employer].

## 2018-05-11 ENCOUNTER — Encounter: Payer: Self-pay | Admitting: Pharmacist

## 2018-05-11 DIAGNOSIS — E785 Hyperlipidemia, unspecified: Secondary | ICD-10-CM | POA: Diagnosis not present

## 2018-05-11 DIAGNOSIS — E7849 Other hyperlipidemia: Secondary | ICD-10-CM | POA: Diagnosis not present

## 2018-05-11 NOTE — Progress Notes (Signed)
Thanks MCr 

## 2018-05-11 NOTE — Assessment & Plan Note (Addendum)
LDL extremely elevated for high-risk patient with PAD at 50% and moderate CAD. Patient well managed with brand name CRESTO with unable to continue to cost. Trial with rosuvastatin, atorvastatin and pravastatin lead to severe cramps and joint pain.   Repatha/Praleunt indication, MOA, common side effects, storage, administration, monitoring, prior-authorization, patient assistance program, and financial responsibility discussed during [place]. Will start paperwork for Repatha/Praleunt as soon as baseline lipid panel completed and follow up as needed. Plan to repeat fasting blood work 2-3 months after initiating new medication.

## 2018-05-12 LAB — LIPID PANEL
Chol/HDL Ratio: 6.4 ratio — ABNORMAL HIGH (ref 0.0–5.0)
Cholesterol, Total: 294 mg/dL — ABNORMAL HIGH (ref 100–199)
HDL: 46 mg/dL (ref >39)
LDL Calculated: 218 mg/dL — ABNORMAL HIGH (ref 0–99)
Triglycerides: 151 mg/dL — ABNORMAL HIGH (ref 0–149)
VLDL Cholesterol Cal: 30 mg/dL (ref 5–40)

## 2018-05-13 ENCOUNTER — Ambulatory Visit (HOSPITAL_COMMUNITY)
Admission: RE | Admit: 2018-05-13 | Discharge: 2018-05-13 | Disposition: A | Discharge status: Home / Self Care | Payer: Medicare HMO | Source: Ambulatory Visit | Attending: Neurology | Admitting: Neurology

## 2018-05-13 DIAGNOSIS — G319 Degenerative disease of nervous system, unspecified: Secondary | ICD-10-CM | POA: Insufficient documentation

## 2018-05-13 DIAGNOSIS — J0100 Acute maxillary sinusitis, unspecified: Secondary | ICD-10-CM | POA: Insufficient documentation

## 2018-05-13 DIAGNOSIS — R42 Dizziness and giddiness: Secondary | ICD-10-CM | POA: Diagnosis not present

## 2018-05-17 ENCOUNTER — Telehealth: Payer: Self-pay | Admitting: Pharmacist

## 2018-05-17 ENCOUNTER — Other Ambulatory Visit: Payer: Self-pay | Admitting: Pharmacist

## 2018-05-17 MED ORDER — EVOLOCUMAB 140 MG/ML ~~LOC~~ SOAJ: 140.0000 mg | SUBCUTANEOUS | 11 refills | Status: DC

## 2018-05-17 NOTE — Telephone Encounter (Signed)
PA for repatha approved by insurance. Rx sent to prefer pharmacy

## 2018-05-23 ENCOUNTER — Telehealth: Payer: Self-pay | Admitting: Pharmacist

## 2018-05-23 NOTE — Telephone Encounter (Signed)
[employer] safetyNet form mailed to patient. Must return completed form ASAP to apply for patient assistance.   Insurance PA effective until 10/2018

## 2018-05-27 DIAGNOSIS — I10 Essential (primary) hypertension: Secondary | ICD-10-CM | POA: Diagnosis not present

## 2018-05-27 DIAGNOSIS — E6609 Other obesity due to excess calories: Secondary | ICD-10-CM | POA: Diagnosis not present

## 2018-05-27 DIAGNOSIS — E119 Type 2 diabetes mellitus without complications: Secondary | ICD-10-CM | POA: Diagnosis not present

## 2018-05-27 DIAGNOSIS — Z6831 Body mass index (BMI) 31.0-31.9, adult: Secondary | ICD-10-CM | POA: Diagnosis not present

## 2018-05-27 DIAGNOSIS — H8303 Labyrinthitis, bilateral: Secondary | ICD-10-CM | POA: Diagnosis not present

## 2018-05-31 DIAGNOSIS — I10 Essential (primary) hypertension: Secondary | ICD-10-CM | POA: Diagnosis not present

## 2018-05-31 DIAGNOSIS — M48061 Spinal stenosis, lumbar region without neurogenic claudication: Secondary | ICD-10-CM | POA: Diagnosis not present

## 2018-05-31 DIAGNOSIS — M5416 Radiculopathy, lumbar region: Secondary | ICD-10-CM | POA: Diagnosis not present

## 2018-05-31 DIAGNOSIS — M5412 Radiculopathy, cervical region: Secondary | ICD-10-CM | POA: Diagnosis not present

## 2018-06-08 ENCOUNTER — Telehealth: Payer: Self-pay

## 2018-06-08 NOTE — Telephone Encounter (Signed)
Called pt to schedule appt time for demonstration with medication and the first injection scheduled for 06/10/18 @ 3:00pm

## 2018-06-13 NOTE — Telephone Encounter (Signed)
Called pt to schedule appt time for demonstration with medication and the first injection scheduled for 06/10/18 @ 3:00pm

## 2018-06-28 DIAGNOSIS — R42 Dizziness and giddiness: Secondary | ICD-10-CM | POA: Diagnosis not present

## 2018-06-28 DIAGNOSIS — H9313 Tinnitus, bilateral: Secondary | ICD-10-CM | POA: Diagnosis not present

## 2018-06-28 DIAGNOSIS — J310 Chronic rhinitis: Secondary | ICD-10-CM | POA: Diagnosis not present

## 2018-06-28 DIAGNOSIS — J343 Hypertrophy of nasal turbinates: Secondary | ICD-10-CM | POA: Diagnosis not present

## 2018-07-08 DIAGNOSIS — M48061 Spinal stenosis, lumbar region without neurogenic claudication: Secondary | ICD-10-CM | POA: Diagnosis not present

## 2018-07-28 DIAGNOSIS — R42 Dizziness and giddiness: Secondary | ICD-10-CM | POA: Diagnosis not present

## 2018-07-28 DIAGNOSIS — H9313 Tinnitus, bilateral: Secondary | ICD-10-CM | POA: Diagnosis not present

## 2018-08-04 ENCOUNTER — Other Ambulatory Visit: Payer: Self-pay | Admitting: Neurosurgery

## 2018-08-04 DIAGNOSIS — R2689 Other abnormalities of gait and mobility: Secondary | ICD-10-CM | POA: Diagnosis not present

## 2018-08-04 DIAGNOSIS — M48061 Spinal stenosis, lumbar region without neurogenic claudication: Secondary | ICD-10-CM

## 2018-08-09 DIAGNOSIS — R2689 Other abnormalities of gait and mobility: Secondary | ICD-10-CM | POA: Diagnosis not present

## 2018-08-12 DIAGNOSIS — R2689 Other abnormalities of gait and mobility: Secondary | ICD-10-CM | POA: Diagnosis not present

## 2018-08-16 DIAGNOSIS — R2689 Other abnormalities of gait and mobility: Secondary | ICD-10-CM | POA: Diagnosis not present

## 2018-08-19 DIAGNOSIS — R2689 Other abnormalities of gait and mobility: Secondary | ICD-10-CM | POA: Diagnosis not present

## 2018-08-19 DIAGNOSIS — Z23 Encounter for immunization: Secondary | ICD-10-CM | POA: Diagnosis not present

## 2018-08-23 DIAGNOSIS — R2689 Other abnormalities of gait and mobility: Secondary | ICD-10-CM | POA: Diagnosis not present

## 2018-08-26 DIAGNOSIS — R2689 Other abnormalities of gait and mobility: Secondary | ICD-10-CM | POA: Diagnosis not present

## 2018-08-30 DIAGNOSIS — R2689 Other abnormalities of gait and mobility: Secondary | ICD-10-CM | POA: Diagnosis not present

## 2018-09-01 ENCOUNTER — Ambulatory Visit: Payer: Medicare HMO | Admitting: Cardiovascular Disease

## 2018-09-01 ENCOUNTER — Encounter: Payer: Self-pay | Admitting: Cardiovascular Disease

## 2018-09-01 VITALS — BP 142/72 | HR 60 | Ht 66.0 in | Wt 208.2 lb

## 2018-09-01 DIAGNOSIS — E7800 Pure hypercholesterolemia, unspecified: Secondary | ICD-10-CM | POA: Diagnosis not present

## 2018-09-01 DIAGNOSIS — E669 Obesity, unspecified: Secondary | ICD-10-CM | POA: Diagnosis not present

## 2018-09-01 DIAGNOSIS — I10 Essential (primary) hypertension: Secondary | ICD-10-CM

## 2018-09-01 DIAGNOSIS — E7801 Familial hypercholesterolemia: Secondary | ICD-10-CM | POA: Diagnosis not present

## 2018-09-01 DIAGNOSIS — E1169 Type 2 diabetes mellitus with other specified complication: Secondary | ICD-10-CM | POA: Diagnosis not present

## 2018-09-01 DIAGNOSIS — M79604 Pain in right leg: Secondary | ICD-10-CM | POA: Diagnosis not present

## 2018-09-01 DIAGNOSIS — I2510 Atherosclerotic heart disease of native coronary artery without angina pectoris: Secondary | ICD-10-CM

## 2018-09-01 DIAGNOSIS — Z79899 Other long term (current) drug therapy: Secondary | ICD-10-CM | POA: Diagnosis not present

## 2018-09-01 DIAGNOSIS — M79605 Pain in left leg: Secondary | ICD-10-CM

## 2018-09-01 NOTE — Patient Instructions (Signed)
Medication Instructions:  NOT NEEDED If you need a refill on your cardiac medications before your next appointment, please call your pharmacy.   Lab work: CMP LIPID - MAY DO LABS IN  [place] OR [place] OFFICE If you have labs (blood work) drawn today and your tests are completely normal, you will receive your results only by: Marland Kitchen MyChart Message (if you have MyChart) OR . A paper copy in the mail If you have any lab test that is abnormal or we need to change your treatment, we will call you to review the results.  Testing/Procedures: SCHEDULE AT [place] OFFICE - Your physician has requested that you have an ankle brachial index (ABI). During this test an ultrasound and blood pressure cuff are used to evaluate the arteries that supply the arms and legs with blood. Allow thirty minutes for this exam. There are no restrictions or special instructions.    Follow-Up: At [facility], you and your health needs are our priority.  As part of our continuing mission to provide you with exceptional heart care, we have created designated Provider Care Teams.  These Care Teams include your primary Cardiologist (physician) and Advanced Practice Providers (APPs -  Physician Assistants and Nurse Practitioners) who all work together to provide you with the care you need, when you need it. You will need a follow up appointment in 12 months FEB 2021.  Please call our office 2 months in advance to schedule this appointment.  You may see Sanda Klein, MD or one of the following Advanced Practice Providers on your designated Care Team: [place], [place] . Fabian Sharp, PA-C  Any Other Special Instructions Will Be Listed Below (If Applicable).

## 2018-09-01 NOTE — Progress Notes (Signed)
[name], male   DOB: [birth year], 78 y.o.   MRN: [id number]    Cardiology Office Note    Date:  09/01/2018   ID:  Douglas Keller, DOB Nov 26, 1940, MRN [id number]  PCP:  Sharilyn Sites, MD  Cardiologist:   Sanda Klein, MD   No chief complaint on file.   History of Present Illness:  Douglas Keller is a 78 y.o. male with hyperlipidemia, hypertension, moderate coronary atherosclerosis and a murmur due to aortic valve sclerosis without stenosis.   He was intolerant of multiple statins even when we tried to supplement coenzyme Q 10.  He is now on Repatha and tolerating it very well.  His problems with balance and dizziness seem to have improved after a wisdom tooth extraction.  He complains of pain in both his legs, worsened by walking.  On the other hand he is able to bike 5 miles on a stationary bicycle without any complaints.  As soon as he gets off a bicycle his legs immediately begins to bother him.  He is able to improve the pain in his legs by crouching forward.  Cardiac catheterization in 2006 showed  non obstructive atherosclerosis (40% proximal LAD, 40% mid RCA) and had nuclear stress test in 2010 with normal perfusion. Echocardiography showed normal left ventricular size and systolic function with aortic valve sclerosis. By his last nuclear stress test LVEF was only 48% but he has no signs or symptoms of congestive heart failure. He has treated hypertension and hyperlipidemia on statin.    Past Medical History:  Diagnosis Date  . Aortic valve sclerosis   . Atherosclerosis of coronary artery without angina pectoris 07/05/2014  . Diabetes mellitus type 2, diet-controlled ([place])   . HTN (hypertension) 07/06/2013  . Hyperlipidemia 07/06/2013    Past Surgical History:  Procedure Laterality Date  . CERVICAL SPINE SURGERY    . COLONOSCOPY  07/26/2012   Procedure: COLONOSCOPY;  Surgeon: Jamesetta So, MD;  Location[facility];  Service: Gastroenterology;   Laterality: N/A;  . KNEE ARTHROSCOPY      Outpatient Medications Prior to Visit  Medication Sig Dispense Refill  . acetaminophen (TYLENOL) 500 MG tablet Take 500 mg by mouth 2 (two) times daily.    Marland Kitchen amLODipine (NORVASC) 10 MG tablet Take 1 tablet (10 mg total) by mouth daily. 30 tablet 6  . aspirin EC 81 MG tablet Take 81 mg by mouth daily.    . chlorthalidone (HYGROTON) 25 MG tablet Take 0.5 tablets (12.5 mg total) by mouth daily. 45 tablet 3  . Evolocumab (REPATHA SURECLICK) 277 MG/ML SOAJ Inject 140 mg into the skin every 14 (fourteen) days. 2 pen 11  . GLUCOSAMINE PO Take 1 tablet by mouth at bedtime.    Marland Kitchen KLOR-CON M20 20 MEQ tablet     . lisinopril (PRINIVIL,ZESTRIL) 40 MG tablet Take 1 tablet (40 mg total) by mouth daily. 30 tablet 6  . Multiple Vitamins-Minerals (AIRBORNE PO) Take 1 each by mouth daily.    . Omega-3 Fatty Acids (FISH OIL) 1000 MG CAPS Take 1 capsule by mouth daily.    . Multiple Vitamin (MULITIVITAMIN WITH MINERALS) TABS Take 1 tablet by mouth daily.     No facility-administered medications prior to visit.      Allergies:   Crestor [rosuvastatin]   Social History   Socioeconomic History  . Marital status: Widowed    Spouse name: Not on file  . Number of children: Not on file  . Years of education:  Not on file  . Highest education level: Not on file  Occupational History  . Not on file  Social Needs  . Financial resource strain: Not on file  . Food insecurity:    Worry: Not on file    Inability: Not on file  . Transportation needs:    Medical: Not on file    Non-medical: Not on file  Tobacco Use  . Smoking status: Never Smoker  . Smokeless tobacco: Never Used  Substance and Sexual Activity  . Alcohol use: No  . Drug use: No  . Sexual activity: Not on file  Lifestyle  . Physical activity:    Days per week: Not on file    Minutes per session: Not on file  . Stress: Not on file  Relationships  . Social connections:    Talks on phone: Not on  file    Gets together: Not on file    Attends religious service: Not on file    Active member of club or organization: Not on file    Attends meetings of clubs or organizations: Not on file    Relationship status: Not on file  Other Topics Concern  . Not on file  Social History Narrative  . Not on file     Family History:  The patient's family history includes Asthma in his mother; Hypertension in his father.   ROS:   Please see the history of present illness.    ROS All other systems are reviewed and are negative   PHYSICAL EXAM:   VS:  BP (!) 142/72   Pulse 60   Ht 5\' 6"  (1.676 m)   Wt 208 lb 3.2 oz (94.4 kg)   BMI 33.60 kg/m      General: Alert, oriented x3, no distress, mildly obese Head: no evidence of trauma, PERRL, EOMI, no exophtalmos or lid lag, no myxedema, no xanthelasma; normal ears, nose and oropharynx Neck: normal jugular venous pulsations and no hepatojugular reflux; brisk carotid pulses without delay and no carotid bruits Chest: clear to auscultation, no signs of consolidation by percussion or palpation, normal fremitus, symmetrical and full respiratory excursions Cardiovascular: normal position and quality of the apical impulse, regular rhythm, normal first and second heart sounds, 2/6 early peaking systolic ejection murmur in the aortic focus, no diastolic murmurs, rubs or gallops Abdomen: no tenderness or distention, no masses by palpation, no abnormal pulsatility or arterial bruits, normal bowel sounds, no hepatosplenomegaly Extremities: no clubbing, cyanosis or edema; 2+ radial, ulnar and brachial pulses bilaterally; 2+ right femoral, posterior tibial and dorsalis pedis pulses; 2+ left femoral, posterior tibial and dorsalis pedis pulses; no subclavian or femoral bruits Neurological: grossly nonfocal Psych: Normal mood and affect    Wt Readings from Last 3 Encounters:  09/01/18 208 lb 3.2 oz (94.4 kg)  02/17/18 200 lb (90.7 kg)  10/29/17 200 lb (90.7  kg)      Studies/Labs Reviewed:   EKG:  EKG is ordered today.  It shows sinus rhythm with minor nonspecific T wave changes, QTC 418 ms.  Recent Labs: 02/17/2018: BUN 15; Creatinine, Ser 1.07; Hemoglobin 14.3; Platelets 279; Potassium 3.2; Sodium 137   Lipid Panel    Component Value Date/Time   CHOL 294 (H) 05/11/2018 0806   TRIG 151 (H) 05/11/2018 0806   HDL 46 05/11/2018 0806   CHOLHDL 6.4 (H) 05/11/2018 0806   CHOLHDL 3.4 11/18/2015 0750   VLDL 23 11/18/2015 0750   LDLCALC 218 (H) 05/11/2018 2025  ASSESSMENT:    1. Heterozygous familial hypercholesterolemia   2. Atherosclerosis of native coronary artery of native heart without angina pectoris   3. Essential hypertension   4. Diabetes mellitus type 2 in obese ([place])   5. Bilateral leg pain   6. Hypercholesterolemia   7. Medication management      PLAN:  In order of problems listed above:  1. CAD: Asymptomatic, but with widespread atherosclerotic lesions at the time of previous coronary angiography. 2. HTN: Excellent control. 3. HLP: His LDL is extremely high, strongly suspected of having O'Brien.  Intolerant to multiple statins so he is now on Repatha, time to recheck his lipid profile 4. DM/obesity: He does not require glucose lowering medication, but his most recent hemoglobin A1c was 6.5%.  Encouraged weight loss.  Exercise is limited by his leg pain. 5. Bilateral leg pain: I suspect that his symptoms are more likely to be related to spondylopathy.  He has had previous cervical spine surgery.  He does not develop claudication when he rides the bicycle but does have it when he walks.  He can alleviate the symptoms by bending forward and flexing his neck, suggesting that this is a spinal compression problem.  We will check ABIs to make sure, since he is definitely at risk for PAD.   Medication Adjustments/Labs and Tests Ordered: Current medicines are reviewed at length with the patient today.  Concerns regarding medicines  are outlined above.  Medication changes, Labs and Tests ordered today are listed in the Patient Instructions below. There are no Patient Instructions on file for this visit.     Signed, Sanda Klein, MD  09/01/2018 9:54 AM    [facility] [place], [place], [place]  [address] Phone: [phone]; Fax: [phone]

## 2018-09-02 DIAGNOSIS — I10 Essential (primary) hypertension: Secondary | ICD-10-CM | POA: Diagnosis not present

## 2018-09-02 DIAGNOSIS — E7800 Pure hypercholesterolemia, unspecified: Secondary | ICD-10-CM | POA: Diagnosis not present

## 2018-09-02 DIAGNOSIS — Z79899 Other long term (current) drug therapy: Secondary | ICD-10-CM | POA: Diagnosis not present

## 2018-09-02 DIAGNOSIS — E785 Hyperlipidemia, unspecified: Secondary | ICD-10-CM | POA: Diagnosis not present

## 2018-09-02 DIAGNOSIS — I2510 Atherosclerotic heart disease of native coronary artery without angina pectoris: Secondary | ICD-10-CM | POA: Diagnosis not present

## 2018-09-02 LAB — COMPREHENSIVE METABOLIC PANEL
ALT: 16 IU/L (ref 0–44)
AST: 22 IU/L (ref 0–40)
Albumin/Globulin Ratio: 1.5 (ref 1.2–2.2)
Albumin: 4.1 g/dL (ref 3.7–4.7)
Alkaline Phosphatase: 67 IU/L (ref 39–117)
BUN/Creatinine Ratio: 10 (ref 10–24)
BUN: 12 mg/dL (ref 8–27)
Bilirubin Total: 0.8 mg/dL (ref 0.0–1.2)
CO2: 28 mmol/L (ref 20–29)
Calcium: 9.4 mg/dL (ref 8.6–10.2)
Chloride: 106 mmol/L (ref 96–106)
Creatinine, Ser: 1.21 mg/dL (ref 0.76–1.27)
GFR calc Af Amer: 67 mL/min/{1.73_m2} (ref >59)
GFR calc non Af Amer: 58 mL/min/{1.73_m2} — ABNORMAL LOW (ref >59)
Globulin, Total: 2.7 g/dL (ref 1.5–4.5)
Glucose: 120 mg/dL — ABNORMAL HIGH (ref 65–99)
Potassium: 4.1 mmol/L (ref 3.5–5.2)
Sodium: 144 mmol/L (ref 134–144)
Total Protein: 6.8 g/dL (ref 6.0–8.5)

## 2018-09-02 LAB — LIPID PANEL
Chol/HDL Ratio: 2.8 ratio (ref 0.0–5.0)
Cholesterol, Total: 120 mg/dL (ref 100–199)
HDL: 43 mg/dL (ref >39)
LDL Calculated: 49 mg/dL (ref 0–99)
Triglycerides: 138 mg/dL (ref 0–149)
VLDL Cholesterol Cal: 28 mg/dL (ref 5–40)

## 2018-09-06 ENCOUNTER — Ambulatory Visit (HOSPITAL_COMMUNITY)
Admission: RE | Admit: 2018-09-06 | Discharge: 2018-09-06 | Disposition: A | Discharge status: Home / Self Care | Payer: Medicare HMO | Source: Ambulatory Visit | Attending: Cardiovascular Disease | Admitting: Cardiovascular Disease

## 2018-09-06 DIAGNOSIS — M79604 Pain in right leg: Secondary | ICD-10-CM | POA: Diagnosis not present

## 2018-09-06 DIAGNOSIS — I2510 Atherosclerotic heart disease of native coronary artery without angina pectoris: Secondary | ICD-10-CM | POA: Insufficient documentation

## 2018-09-06 DIAGNOSIS — M79605 Pain in left leg: Secondary | ICD-10-CM | POA: Insufficient documentation

## 2018-10-11 ENCOUNTER — Telehealth: Payer: Self-pay

## 2018-10-11 NOTE — Telephone Encounter (Signed)
-----   Message from [place], [place] sent at 09/06/2018  3:57 PM EST ----- Regarding: order lipids pa due 11/11/18

## 2018-10-21 ENCOUNTER — Encounter (HOSPITAL_COMMUNITY): Payer: Self-pay

## 2018-10-21 ENCOUNTER — Other Ambulatory Visit: Payer: Self-pay

## 2018-10-21 ENCOUNTER — Emergency Department (HOSPITAL_COMMUNITY)
Admission: EM | Admit: 2018-10-21 | Discharge: 2018-10-21 | Disposition: A | Discharge status: Home / Self Care | Payer: Medicare HMO | Attending: Emergency Medicine | Admitting: Emergency Medicine

## 2018-10-21 DIAGNOSIS — E119 Type 2 diabetes mellitus without complications: Secondary | ICD-10-CM | POA: Diagnosis not present

## 2018-10-21 DIAGNOSIS — Z7982 Long term (current) use of aspirin: Secondary | ICD-10-CM | POA: Diagnosis not present

## 2018-10-21 DIAGNOSIS — R2689 Other abnormalities of gait and mobility: Secondary | ICD-10-CM | POA: Diagnosis not present

## 2018-10-21 DIAGNOSIS — I10 Essential (primary) hypertension: Secondary | ICD-10-CM | POA: Diagnosis not present

## 2018-10-21 DIAGNOSIS — R42 Dizziness and giddiness: Secondary | ICD-10-CM

## 2018-10-21 DIAGNOSIS — Z79899 Other long term (current) drug therapy: Secondary | ICD-10-CM | POA: Diagnosis not present

## 2018-10-21 LAB — CBC WITH DIFFERENTIAL/PLATELET
Abs Immature Granulocytes: 0.01 10*3/uL (ref 0.00–0.07)
Basophils Absolute: 0 10*3/uL (ref 0.0–0.1)
Basophils Relative: 0 %
Eosinophils Absolute: 0.2 10*3/uL (ref 0.0–0.5)
Eosinophils Relative: 3 %
HCT: 43 % (ref 39.0–52.0)
Hemoglobin: 14.3 g/dL (ref 13.0–17.0)
Immature Granulocytes: 0 %
Lymphocytes Relative: 31 %
Lymphs Abs: 2.2 10*3/uL (ref 0.7–4.0)
MCH: 30.4 pg (ref 26.0–34.0)
MCHC: 33.3 g/dL (ref 30.0–36.0)
MCV: 91.5 fL (ref 80.0–100.0)
Monocytes Absolute: 0.7 10*3/uL (ref 0.1–1.0)
Monocytes Relative: 10 %
Neutro Abs: 4 10*3/uL (ref 1.7–7.7)
Neutrophils Relative %: 56 %
Platelets: 280 10*3/uL (ref 150–400)
RBC: 4.7 MIL/uL (ref 4.22–5.81)
RDW: 12.4 % (ref 11.5–15.5)
WBC: 7.3 10*3/uL (ref 4.0–10.5)
nRBC: 0 % (ref 0.0–0.2)

## 2018-10-21 LAB — COMPREHENSIVE METABOLIC PANEL
ALT: 20 U/L (ref 0–44)
AST: 26 U/L (ref 15–41)
Albumin: 3.8 g/dL (ref 3.5–5.0)
Alkaline Phosphatase: 63 U/L (ref 38–126)
Anion gap: 10 (ref 5–15)
BUN: 12 mg/dL (ref 8–23)
CO2: 27 mmol/L (ref 22–32)
Calcium: 8.5 mg/dL — ABNORMAL LOW (ref 8.9–10.3)
Chloride: 104 mmol/L (ref 98–111)
Creatinine, Ser: 1.05 mg/dL (ref 0.61–1.24)
GFR calc Af Amer: >60 mL/min (ref >60)
GFR calc non Af Amer: >60 mL/min (ref >60)
Glucose, Bld: 111 mg/dL — ABNORMAL HIGH (ref 70–99)
Potassium: 3.5 mmol/L (ref 3.5–5.1)
Sodium: 141 mmol/L (ref 135–145)
Total Bilirubin: 0.7 mg/dL (ref 0.3–1.2)
Total Protein: 7.1 g/dL (ref 6.5–8.1)

## 2018-10-21 LAB — URINALYSIS, ROUTINE W REFLEX MICROSCOPIC
Bilirubin Urine: NEGATIVE
Glucose, UA: NEGATIVE mg/dL
Hgb urine dipstick: NEGATIVE
Ketones, ur: NEGATIVE mg/dL
Leukocytes,Ua: NEGATIVE
Nitrite: NEGATIVE
Protein, ur: NEGATIVE mg/dL
Specific Gravity, Urine: 1.016 (ref 1.005–1.030)
pH: 5 (ref 5.0–8.0)

## 2018-10-21 LAB — TROPONIN I: Troponin I: <0.03 ng/mL (ref <0.03)

## 2018-10-21 NOTE — ED Provider Notes (Signed)
[facility] EMERGENCY DEPARTMENT Provider Note   CSN: [id number] Arrival date & time: 10/21/18  0136    History   Chief Complaint Chief Complaint  Patient presents with  . Dizziness    HPI Douglas Keller is a 78 y.o. male.  The history is provided by the patient.  He has history of hypertension, diabetes, hyperlipidemia, spinal stenosis in the cervical region and comes in complaining of dizziness and being off balance tonight.  He has been having difficulty with this for over 1.5 years, and has been evaluated by cardiology and neurology as well as ENT.  He denies vertigo, nausea, vomiting, chest pain, heaviness, tightness, pressure.  He did notice he was off balance when he was walking.  Tonight's episode was not significantly different from other episodes that he has had.  Past Medical History:  Diagnosis Date  . Aortic valve sclerosis   . Atherosclerosis of coronary artery without angina pectoris 07/05/2014  . Diabetes mellitus type 2, diet-controlled ([place])   . HTN (hypertension) 07/06/2013  . Hyperlipidemia 07/06/2013    Patient Active Problem List   Diagnosis Date Noted  . Diabetes mellitus type 2 in obese ([place]) 09/01/2018  . Heterozygous familial hypercholesterolemia 09/01/2018  . Hypercholesterolemia 08/21/2015  . Medication management 08/21/2015  . Atherosclerosis of coronary artery without angina pectoris 07/05/2014  . Stiffness of left shoulder joint 07/19/2013  . Hyperlipidemia 07/06/2013  . HTN (hypertension) 07/06/2013  . Cervical spondylosis without myelopathy 02/13/2013  . Degeneration of cervical intervertebral disc 02/13/2013  . Spinal stenosis in cervical region 02/13/2013    Past Surgical History:  Procedure Laterality Date  . CERVICAL SPINE SURGERY    . COLONOSCOPY  07/26/2012   Procedure: COLONOSCOPY;  Surgeon: Jamesetta So, MD;  Location[facility];  Service: Gastroenterology;  Laterality: N/A;  . KNEE ARTHROSCOPY          Home  Medications    Prior to Admission medications   Medication Sig Start Date End Date Taking? Authorizing Provider  acetaminophen (TYLENOL) 500 MG tablet Take 500 mg by mouth 2 (two) times daily.    [provider]  amLODipine (NORVASC) 10 MG tablet Take 1 tablet (10 mg total) by mouth daily. 12/30/12   Croitoru, Mihai, MD  aspirin EC 81 MG tablet Take 81 mg by mouth daily.    [provider]  chlorthalidone (HYGROTON) 25 MG tablet Take 0.5 tablets (12.5 mg total) by mouth daily. 02/15/18   Croitoru, Mihai, MD  Evolocumab (REPATHA SURECLICK) 008 MG/ML SOAJ Inject 140 mg into the skin every 14 (fourteen) days. 05/17/18   Croitoru, Mihai, MD  GLUCOSAMINE PO Take 1 tablet by mouth at bedtime.    [provider]  KLOR-CON M20 20 MEQ tablet  07/29/17   [provider]  lisinopril (PRINIVIL,ZESTRIL) 40 MG tablet Take 1 tablet (40 mg total) by mouth daily. 12/30/12   Croitoru, Mihai, MD  Multiple Vitamins-Minerals (AIRBORNE PO) Take 1 each by mouth daily.    [provider]  Omega-3 Fatty Acids (FISH OIL) 1000 MG CAPS Take 1 capsule by mouth daily.    [provider]    Family History Family History  Problem Relation Age of Onset  . Asthma Mother   . Hypertension Father   . Colon cancer Neg Hx     Social History Social History   Tobacco Use  . Smoking status: Never Smoker  . Smokeless tobacco: Never Used  Substance Use Topics  . Alcohol use: No  .  Drug use: No     Allergies   Crestor [rosuvastatin]   Review of Systems Review of Systems  All other systems reviewed and are negative.    Physical Exam Updated Vital Signs BP (!) 147/86 (BP Location: Right Arm)   Pulse 60   Temp 97.9 F (36.6 C) (Oral)   Resp 16   Ht 5\' 6"  (1.676 m)   Wt 90.7 kg   SpO2 97%   BMI 32.28 kg/m   Physical Exam Vitals signs and nursing note reviewed.    78 year old male, resting comfortably and in no acute distress. Vital signs are significant  for mildly elevated systolic blood pressure. Oxygen saturation is 97%, which is normal. Head is normocephalic and atraumatic. PERRLA, EOMI. Oropharynx is clear. Neck is nontender and supple without adenopathy or JVD.  There are no carotid bruits. Back is nontender and there is no CVA tenderness. Lungs are clear without rales, wheezes, or rhonchi. Chest is nontender. Heart has regular rate and rhythm without murmur. Abdomen is soft, flat, nontender without masses or hepatosplenomegaly and peristalsis is normoactive. Extremities have no cyanosis or edema, full range of motion is present. Skin is warm and dry without rash. Neurologic: Mental status is normal, cranial nerves are intact, there are no motor or sensory deficits.  Finger-to-nose testing is normal.  On Romberg test, he is somewhat unsteady but does not fall in any one direction consistently.  ED Treatments / Results  Labs (all labs ordered are listed, but only abnormal results are displayed) Labs Reviewed  COMPREHENSIVE METABOLIC PANEL - Abnormal; Notable for the following components:      Result Value   Glucose, Bld 111 (*)    Calcium 8.5 (*)    All other components within normal limits  CBC WITH DIFFERENTIAL/PLATELET  TROPONIN I  URINALYSIS, ROUTINE W REFLEX MICROSCOPIC    EKG EKG Interpretation  Date/Time:  Friday October 21 2018 01:45:44 EDT Ventricular Rate:  61 PR Interval:    QRS Duration: 114 QT Interval:  462 QTC Calculation: 466 R Axis:   -11 Text Interpretation:  Sinus rhythm Borderline intraventricular conduction delay Borderline low voltage, extremity leads When compared with ECG of 02/18/2018, No significant change was found Confirmed by Delora Fuel ([id number]) on 10/21/2018 2:27:37 AM  Procedures Procedures  Medications Ordered in ED Medications - No data to display   Initial Impression / Assessment and Plan / ED Course  I have reviewed the triage vital signs and the nursing notes.  Pertinent lab results  that were available during my care of the patient were reviewed by me and considered in my medical decision making (see chart for details).  Dizziness which apparently is part of a longstanding problem.  Old records are reviewed confirming evaluation by neurology and cardiology with no clear etiology found.  Also, he has had similar complaints going back at least 5years.  CTA of the neck and head as well as MRI of the brain showed no cause for his symptoms.  In the ED, I checked him for orthostasis and found no significant change in pulse or blood pressure.  Will check screening labs, but I do not anticipate any significant change in treatment.  ECG was done and is unchanged from previous.  Screening labs are unremarkable.  Normal glucose, renal function, electrolytes, hepatic function, hemoglobin.  Patient is advised of these findings.  Recommended he continue to follow-up with his cardiologist and neurologist as well as primary care provider.  Final Clinical Impressions(s) /  ED Diagnoses   Final diagnoses:  Dizziness    ED Discharge Orders    None       Delora Fuel, MD 69/62/95 [phone]

## 2018-10-21 NOTE — ED Triage Notes (Signed)
Pt reports chronic intermittent dizziness that he has been evaluated for in the past, states this episode x 2-3 days, states 'I nearly passed out tonight".  Pt denies other complaints, denies pain.

## 2018-10-21 NOTE — Discharge Instructions (Addendum)
Your evaluation today did not show the cause of your dizziness.  Please continue to follow-up with your primary care provider, your cardiologist, and your neurologist.  Return if symptoms are getting worse.

## 2018-10-21 NOTE — ED Notes (Signed)
Pt given water per request- pt has not been able to provide urine sample

## 2018-11-14 ENCOUNTER — Telehealth: Payer: Self-pay

## 2018-11-14 NOTE — Telephone Encounter (Signed)
And another.Marland KitchenMarland Kitchen

## 2018-12-13 ENCOUNTER — Other Ambulatory Visit: Payer: Self-pay | Admitting: Cardiovascular Disease

## 2018-12-27 DIAGNOSIS — B351 Tinea unguium: Secondary | ICD-10-CM | POA: Diagnosis not present

## 2018-12-27 DIAGNOSIS — Z1389 Encounter for screening for other disorder: Secondary | ICD-10-CM | POA: Diagnosis not present

## 2018-12-27 DIAGNOSIS — Z129 Encounter for screening for malignant neoplasm, site unspecified: Secondary | ICD-10-CM | POA: Diagnosis not present

## 2018-12-27 DIAGNOSIS — Z0001 Encounter for general adult medical examination with abnormal findings: Secondary | ICD-10-CM | POA: Diagnosis not present

## 2018-12-27 DIAGNOSIS — E6609 Other obesity due to excess calories: Secondary | ICD-10-CM | POA: Diagnosis not present

## 2018-12-27 DIAGNOSIS — H6123 Impacted cerumen, bilateral: Secondary | ICD-10-CM | POA: Diagnosis not present

## 2018-12-27 DIAGNOSIS — Z6831 Body mass index (BMI) 31.0-31.9, adult: Secondary | ICD-10-CM | POA: Diagnosis not present

## 2018-12-27 DIAGNOSIS — E782 Mixed hyperlipidemia: Secondary | ICD-10-CM | POA: Diagnosis not present

## 2018-12-27 DIAGNOSIS — I10 Essential (primary) hypertension: Secondary | ICD-10-CM | POA: Diagnosis not present

## 2018-12-27 DIAGNOSIS — E119 Type 2 diabetes mellitus without complications: Secondary | ICD-10-CM | POA: Diagnosis not present

## 2019-01-05 ENCOUNTER — Other Ambulatory Visit: Payer: Self-pay | Admitting: Cardiovascular Disease

## 2019-01-05 MED ORDER — KLOR-CON M20 20 MEQ PO TBCR: 20.0000 meq | EXTENDED_RELEASE_TABLET | Freq: Every day | ORAL | 0 refills | Status: DC

## 2019-01-05 NOTE — Telephone Encounter (Signed)
New Message      *STAT* If patient is at the pharmacy, call can be transferred to refill team.   1. Which medications need to be refilled? (please list name of each medication and dose if known) Klor-con m20   2. Which pharmacy/location (including street and city if local pharmacy) is medication to be sent to? [facility] mail order   3. Do they need a 30 day or 90 day supply? 90 day

## 2019-01-29 ENCOUNTER — Observation Stay (HOSPITAL_BASED_OUTPATIENT_CLINIC_OR_DEPARTMENT_OTHER): Payer: Medicare HMO

## 2019-01-29 ENCOUNTER — Encounter (HOSPITAL_COMMUNITY): Payer: Self-pay

## 2019-01-29 ENCOUNTER — Emergency Department (HOSPITAL_COMMUNITY): Payer: Medicare HMO

## 2019-01-29 ENCOUNTER — Observation Stay (HOSPITAL_COMMUNITY)
Admission: EM | Admit: 2019-01-29 | Discharge: 2019-01-30 | Disposition: A | Discharge status: Home / Self Care | Payer: Medicare HMO | Attending: Internal Medicine | Admitting: Internal Medicine

## 2019-01-29 ENCOUNTER — Other Ambulatory Visit: Payer: Self-pay

## 2019-01-29 DIAGNOSIS — Z79899 Other long term (current) drug therapy: Secondary | ICD-10-CM | POA: Diagnosis not present

## 2019-01-29 DIAGNOSIS — R7989 Other specified abnormal findings of blood chemistry: Secondary | ICD-10-CM | POA: Insufficient documentation

## 2019-01-29 DIAGNOSIS — Z7982 Long term (current) use of aspirin: Secondary | ICD-10-CM | POA: Diagnosis not present

## 2019-01-29 DIAGNOSIS — R079 Chest pain, unspecified: Secondary | ICD-10-CM

## 2019-01-29 DIAGNOSIS — Z03818 Encounter for observation for suspected exposure to other biological agents ruled out: Secondary | ICD-10-CM | POA: Insufficient documentation

## 2019-01-29 DIAGNOSIS — I10 Essential (primary) hypertension: Secondary | ICD-10-CM | POA: Insufficient documentation

## 2019-01-29 DIAGNOSIS — E1169 Type 2 diabetes mellitus with other specified complication: Secondary | ICD-10-CM

## 2019-01-29 DIAGNOSIS — K219 Gastro-esophageal reflux disease without esophagitis: Secondary | ICD-10-CM

## 2019-01-29 DIAGNOSIS — E119 Type 2 diabetes mellitus without complications: Secondary | ICD-10-CM | POA: Diagnosis not present

## 2019-01-29 DIAGNOSIS — R0789 Other chest pain: Secondary | ICD-10-CM | POA: Diagnosis not present

## 2019-01-29 DIAGNOSIS — R778 Other specified abnormalities of plasma proteins: Secondary | ICD-10-CM

## 2019-01-29 DIAGNOSIS — E785 Hyperlipidemia, unspecified: Secondary | ICD-10-CM | POA: Diagnosis not present

## 2019-01-29 DIAGNOSIS — Z20828 Contact with and (suspected) exposure to other viral communicable diseases: Secondary | ICD-10-CM | POA: Diagnosis not present

## 2019-01-29 DIAGNOSIS — M542 Cervicalgia: Secondary | ICD-10-CM | POA: Diagnosis not present

## 2019-01-29 HISTORY — DX: Chest pain, unspecified: R07.9

## 2019-01-29 LAB — LIPID PANEL
Cholesterol: 131 mg/dL (ref 0–200)
HDL: 40 mg/dL — ABNORMAL LOW (ref >40)
LDL Cholesterol: 61 mg/dL (ref 0–99)
Total CHOL/HDL Ratio: 3.3 RATIO
Triglycerides: 152 mg/dL — ABNORMAL HIGH (ref <150)
VLDL: 30 mg/dL (ref 0–40)

## 2019-01-29 LAB — CBC
HCT: 40.1 % (ref 39.0–52.0)
Hemoglobin: 13.3 g/dL (ref 13.0–17.0)
MCH: 30.3 pg (ref 26.0–34.0)
MCHC: 33.2 g/dL (ref 30.0–36.0)
MCV: 91.3 fL (ref 80.0–100.0)
Platelets: 275 10*3/uL (ref 150–400)
RBC: 4.39 MIL/uL (ref 4.22–5.81)
RDW: 13.2 % (ref 11.5–15.5)
WBC: 7.6 10*3/uL (ref 4.0–10.5)
nRBC: 0 % (ref 0.0–0.2)

## 2019-01-29 LAB — VITAMIN B12: Vitamin B-12: 322 pg/mL (ref 180–914)

## 2019-01-29 LAB — BASIC METABOLIC PANEL
Anion gap: 9 (ref 5–15)
BUN: 14 mg/dL (ref 8–23)
CO2: 24 mmol/L (ref 22–32)
Calcium: 8.2 mg/dL — ABNORMAL LOW (ref 8.9–10.3)
Chloride: 108 mmol/L (ref 98–111)
Creatinine, Ser: 0.94 mg/dL (ref 0.61–1.24)
GFR calc Af Amer: >60 mL/min (ref >60)
GFR calc non Af Amer: >60 mL/min (ref >60)
Glucose, Bld: 123 mg/dL — ABNORMAL HIGH (ref 70–99)
Potassium: 2.8 mmol/L — ABNORMAL LOW (ref 3.5–5.1)
Sodium: 141 mmol/L (ref 135–145)

## 2019-01-29 LAB — ECHOCARDIOGRAM COMPLETE

## 2019-01-29 LAB — TROPONIN I (HIGH SENSITIVITY)
Troponin I (High Sensitivity): 29 ng/L — ABNORMAL HIGH (ref <18)
Troponin I (High Sensitivity): 32 ng/L — ABNORMAL HIGH (ref <18)
Troponin I (High Sensitivity): 29 ng/L — ABNORMAL HIGH (ref <18)
Troponin I (High Sensitivity): 27 ng/L — ABNORMAL HIGH (ref <18)
Troponin I (High Sensitivity): 25 ng/L — ABNORMAL HIGH (ref <18)

## 2019-01-29 LAB — SARS CORONAVIRUS 2 BY RT PCR (HOSPITAL ORDER, PERFORMED IN ~~LOC~~ HOSPITAL LAB): SARS Coronavirus 2: NEGATIVE

## 2019-01-29 LAB — GLUCOSE, CAPILLARY
Glucose-Capillary: 106 mg/dL — ABNORMAL HIGH (ref 70–99)
Glucose-Capillary: 83 mg/dL (ref 70–99)
Glucose-Capillary: 132 mg/dL — ABNORMAL HIGH (ref 70–99)

## 2019-01-29 LAB — TSH: TSH: 2.351 u[IU]/mL (ref 0.350–4.500)

## 2019-01-29 LAB — HEMOGLOBIN A1C
Hgb A1c MFr Bld: 6.5 % — ABNORMAL HIGH (ref 4.8–5.6)
Mean Plasma Glucose: 139.85 mg/dL

## 2019-01-29 MED ORDER — ACETAMINOPHEN 325 MG PO TABS: 650.0000 mg | ORAL_TABLET | ORAL | Status: DC | PRN

## 2019-01-29 MED ORDER — POTASSIUM CHLORIDE CRYS ER 20 MEQ PO TBCR
40.0000 meq | EXTENDED_RELEASE_TABLET | Freq: Once | ORAL | Status: AC
  Administered 2019-01-29: 06:00:00 40 meq via ORAL
  Filled 2019-01-29: qty 2

## 2019-01-29 MED ORDER — INSULIN ASPART 100 UNIT/ML ~~LOC~~ SOLN: 0.0000 [IU] | Freq: Every day | SUBCUTANEOUS | Status: DC

## 2019-01-29 MED ORDER — OMEGA-3-ACID ETHYL ESTERS 1 G PO CAPS
1.0000 g | ORAL_CAPSULE | Freq: Two times a day (BID) | ORAL | Status: DC
  Administered 2019-01-29 – 2019-01-30 (×3): 1 g via ORAL
  Filled 2019-01-29 (×6): qty 1

## 2019-01-29 MED ORDER — NITROGLYCERIN 2 % TD OINT
1.0000 [in_us] | TOPICAL_OINTMENT | Freq: Once | TRANSDERMAL | Status: AC
  Administered 2019-01-29: 1 [in_us] via TOPICAL
  Filled 2019-01-29: qty 1

## 2019-01-29 MED ORDER — INSULIN ASPART 100 UNIT/ML ~~LOC~~ SOLN
0.0000 [IU] | Freq: Three times a day (TID) | SUBCUTANEOUS | Status: DC
  Administered 2019-01-30: 1 [IU] via SUBCUTANEOUS

## 2019-01-29 MED ORDER — ONDANSETRON HCL 4 MG/2ML IJ SOLN: 4.0000 mg | Freq: Four times a day (QID) | INTRAMUSCULAR | Status: DC | PRN

## 2019-01-29 MED ORDER — LISINOPRIL 10 MG PO TABS
40.0000 mg | ORAL_TABLET | Freq: Every day | ORAL | Status: DC
  Administered 2019-01-29 – 2019-01-30 (×2): 40 mg via ORAL
  Filled 2019-01-29 (×2): qty 4

## 2019-01-29 MED ORDER — ASPIRIN EC 81 MG PO TBEC
81.0000 mg | DELAYED_RELEASE_TABLET | Freq: Every day | ORAL | Status: DC
  Administered 2019-01-29 – 2019-01-30 (×2): 81 mg via ORAL
  Filled 2019-01-29 (×2): qty 1

## 2019-01-29 MED ORDER — HEPARIN SODIUM (PORCINE) 5000 UNIT/ML IJ SOLN
5000.0000 [IU] | Freq: Three times a day (TID) | INTRAMUSCULAR | Status: DC
  Administered 2019-01-29 – 2019-01-30 (×4): 5000 [IU] via SUBCUTANEOUS
  Filled 2019-01-29 (×4): qty 1

## 2019-01-29 MED ORDER — PANTOPRAZOLE SODIUM 40 MG PO TBEC
40.0000 mg | DELAYED_RELEASE_TABLET | Freq: Every day | ORAL | Status: DC
  Administered 2019-01-29 – 2019-01-30 (×2): 40 mg via ORAL
  Filled 2019-01-29 (×2): qty 1

## 2019-01-29 MED ORDER — POTASSIUM CHLORIDE IN NACL 20-0.9 MEQ/L-% IV SOLN
INTRAVENOUS | Status: DC
  Administered 2019-01-29: 13:00:00 via INTRAVENOUS

## 2019-01-29 MED ORDER — ASPIRIN 325 MG PO TABS
325.0000 mg | ORAL_TABLET | Freq: Once | ORAL | Status: AC
  Administered 2019-01-29: 06:00:00 325 mg via ORAL
  Filled 2019-01-29: qty 1

## 2019-01-29 NOTE — H&P (Signed)
History and Physical    Douglas Keller KXF:[id number] DOB: 1940/10/09 DOA: 01/29/2019  Referring MD/NP/PA: Dr. Tomi Bamberger PCP: Sharilyn Sites, MD  Patient coming from: home  Chief Complaint: chest pain  HPI: Douglas Keller is a 78 y.o. male with a past medical history significant for aortic valve sclerosis, hyperlipidemia, hypertension and, type 2 diabetes (diet controlled currently); who presented to the emergency department secondary to chest pain.  Patient report pain has been present for the last 2 days prior to admission, localized in the middle of his chest with partial radiation to left and right side and on the day of admission extending towards his right arm; pain is intermittent, pressure type in nature, 7 out of 10 in intensity, associated with nausea, palpitation and sweating.  Patient denies fevers, chills, vomiting, dysuria, hematuria, melena, hematochezia, coughing spells, orthopnea, focal weakness, sick contacts or any other complaints. In the emergency department patient vital signs were overall stable, EKG without acute ischemic changes, mildly elevated troponin and improvement/resolution of her pain with the use of aspirin and nitroglycerin.  Given patient risk factors (heart score of 4), TRH was consulted to place patient in observation for further evaluation, management and ACS rule out..  Past Medical/Surgical History: Past Medical History:  Diagnosis Date  . Aortic valve sclerosis   . Atherosclerosis of coronary artery without angina pectoris 07/05/2014  . Diabetes mellitus type 2, diet-controlled ([place])   . HTN (hypertension) 07/06/2013  . Hyperlipidemia 07/06/2013    Past Surgical History:  Procedure Laterality Date  . CERVICAL SPINE SURGERY    . COLONOSCOPY  07/26/2012   Procedure: COLONOSCOPY;  Surgeon: Jamesetta So, MD;  Location: [facility];  Service: Gastroenterology;  Laterality: N/A;  . KNEE ARTHROSCOPY      Social History:  reports that he has never  smoked. He has never used smokeless tobacco. He reports that he does not drink alcohol or use drugs.  Allergies: Allergies  Allergen Reactions  . Crestor [Rosuvastatin] Other (See Comments)    Cramps     Family History:  Family History  Problem Relation Age of Onset  . Asthma Mother   . Hypertension Father   . Colon cancer Neg Hx     Prior to Admission medications   Medication Sig Start Date End Date Taking? Authorizing Provider  acetaminophen (TYLENOL) 500 MG tablet Take 500 mg by mouth 2 (two) times daily.   Yes [provider]  amLODipine (NORVASC) 10 MG tablet Take 1 tablet (10 mg total) by mouth daily. 12/30/12  Yes Croitoru, Mihai, MD  aspirin EC 81 MG tablet Take 81 mg by mouth daily.   Yes [provider]  chlorthalidone (HYGROTON) 25 MG tablet TAKE 1/2 TABLET EVERY DAY 12/14/18  Yes Croitoru, Mihai, MD  Evolocumab (REPATHA SURECLICK) 696 MG/ML SOAJ Inject 140 mg into the skin every 14 (fourteen) days. 05/17/18  Yes Croitoru, Mihai, MD  GLUCOSAMINE PO Take 1 tablet by mouth at bedtime.   Yes [provider]  KLOR-CON M20 20 MEQ tablet Take 1 tablet (20 mEq total) by mouth daily. 01/05/19  Yes Croitoru, Mihai, MD  lisinopril (PRINIVIL,ZESTRIL) 40 MG tablet Take 1 tablet (40 mg total) by mouth daily. 12/30/12  Yes Croitoru, Mihai, MD  Multiple Vitamins-Minerals (AIRBORNE PO) Take 1 each by mouth daily.   Yes [provider]  Omega-3 Fatty Acids (FISH OIL) 1000 MG CAPS Take 1 capsule by mouth daily.   Yes [provider]    Review of Systems:  Negative  except as otherwise mentioned in HPI.   Physical Exam: Vitals:   01/29/19 0700 01/29/19 1000 01/29/19 1508 01/29/19 1800  BP: 138/75 (!) 150/84 (!) 141/68 (!) 144/67  Pulse: (!) 57 (!) 55 (!) 56 (!) 59  Resp: 20 18 16 20   Temp:   97.8 F (36.6 C) 98.3 F (36.8 C)  TempSrc:   Oral Oral  SpO2: 100% 100% 98% 98%    Constitutional: NAD, calm, comfortable, currently chest  pain-free.  Afebrile, no nausea or vomiting.  Eyes: PERRL, lids and conjunctivae normal, no icterus. ENMT: Mucous membranes are moist. Posterior pharynx clear of any exudate or lesions. Normal dentition.  Neck: normal, supple, no masses, no thyromegaly, no JVD. Respiratory: clear to auscultation bilaterally, no wheezing, no crackles. Normal respiratory effort. No accessory muscle use.  Cardiovascular: Regular rate and rhythm, no rubs or gallops, positive SEM appreciated on exam. No extremity edema. 2+ pedal pulses. No carotid bruits.  Abdomen: no tenderness, no masses palpated. No hepatosplenomegaly. Bowel sounds positive.  Musculoskeletal: no clubbing / cyanosis. No joint deformity upper and lower extremities. Good ROM, no contractures. Normal muscle tone.  Skin: no rashes, lesions, ulcers. No induration Neurologic: CN 2-12 grossly intact. Sensation intact, DTR normal. Strength 5/5 in all 4.  Psychiatric: Normal judgment and insight. Alert and oriented x 3. Normal mood.    Labs on Admission: I have personally reviewed the following labs and imaging studies  CBC: Recent Labs  Lab 01/29/19 0427  WBC 7.6  HGB 13.3  HCT 40.1  MCV 91.3  PLT 709   Basic Metabolic Panel: Recent Labs  Lab 01/29/19 0427  NA 141  K 2.8*  CL 108  CO2 24  GLUCOSE 123*  BUN 14  CREATININE 0.94  CALCIUM 8.2*   GFR: CrCl cannot be calculated (Unknown ideal weight.).   HbA1C: Recent Labs    01/29/19 0621  HGBA1C 6.5*   CBG: Recent Labs  Lab 01/29/19 1123 01/29/19 1611  GLUCAP 106* 83   Lipid Profile: Recent Labs    01/29/19 0800  CHOL 131  HDL 40*  LDLCALC 61  TRIG 152*  CHOLHDL 3.3   Thyroid Function Tests: Recent Labs    01/29/19 0427  TSH 2.351   Anemia Panel: Recent Labs    01/29/19 0427  VITAMINB12 322   Urine analysis:    Component Value Date/Time   COLORURINE YELLOW 10/21/2018 0430   APPEARANCEUR CLEAR 10/21/2018 0430   LABSPEC 1.016 10/21/2018 0430   PHURINE  5.0 10/21/2018 0430   GLUCOSEU NEGATIVE 10/21/2018 0430   HGBUR NEGATIVE 10/21/2018 0430   BILIRUBINUR NEGATIVE 10/21/2018 0430   KETONESUR NEGATIVE 10/21/2018 0430   PROTEINUR NEGATIVE 10/21/2018 0430   NITRITE NEGATIVE 10/21/2018 0430   LEUKOCYTESUR NEGATIVE 10/21/2018 0430    Recent Results (from the past 240 hour(s))  SARS Coronavirus 2 (CEPHEID - Performed in [place] hospital lab), Hosp Order     Status: None   Collection Time: 01/29/19  7:37 AM   Specimen: Nasopharyngeal Swab  Result Value Ref Range Status   SARS Coronavirus 2 NEGATIVE NEGATIVE Final    Comment: (NOTE) If result is NEGATIVE SARS-CoV-2 target nucleic acids are NOT DETECTED. The SARS-CoV-2 RNA is generally detectable in upper and lower  respiratory specimens during the acute phase of infection. The lowest  concentration of SARS-CoV-2 viral copies this assay can detect is 250  copies / mL. A negative result does not preclude SARS-CoV-2 infection  and should not be used as the sole basis  for treatment or other  patient management decisions.  A negative result may occur with  improper specimen collection / handling, submission of specimen other  than nasopharyngeal swab, presence of viral mutation(s) within the  areas targeted by this assay, and inadequate number of viral copies  (<250 copies / mL). A negative result must be combined with clinical  observations, patient history, and epidemiological information. If result is POSITIVE SARS-CoV-2 target nucleic acids are DETECTED. The SARS-CoV-2 RNA is generally detectable in upper and lower  respiratory specimens dur ing the acute phase of infection.  Positive  results are indicative of active infection with SARS-CoV-2.  Clinical  correlation with patient history and other diagnostic information is  necessary to determine patient infection status.  Positive results do  not rule out bacterial infection or co-infection with other viruses. If result is  PRESUMPTIVE POSTIVE SARS-CoV-2 nucleic acids MAY BE PRESENT.   A presumptive positive result was obtained on the submitted specimen  and confirmed on repeat testing.  While 2019 novel coronavirus  (SARS-CoV-2) nucleic acids may be present in the submitted sample  additional confirmatory testing may be necessary for epidemiological  and / or clinical management purposes  to differentiate between  SARS-CoV-2 and other Sarbecovirus currently known to infect humans.  If clinically indicated additional testing with an alternate test  methodology [phone]) is advised. The SARS-CoV-2 RNA is generally  detectable in upper and lower respiratory sp ecimens during the acute  phase of infection. The expected result is Negative. Fact Sheet for Patients:  StrictlyIdeas.no Fact Sheet for Healthcare Providers: BankingDealers.co.za This test is not yet approved or cleared by the [place] FDA and has been authorized for detection and/or diagnosis of SARS-CoV-2 by FDA under an Emergency Use Authorization (EUA).  This EUA will remain in effect (meaning this test can be used) for the duration of the COVID-19 declaration under Section 564(b)(1) of the Act, 21 U.S.C. section 360bbb-3(b)(1), unless the authorization is terminated or revoked sooner. Performed at [facility], [address], [place], [place] [address]      Radiological Exams on Admission: Dg Cervical Spine Complete  Result Date: 01/29/2019 CLINICAL DATA:  Pt states he has had centralized cp and neck pain that radiates down his right arm onset since Friday. Pt denies any sob or injury to neck. Pt has hx of diabetes and hypertension. Pt denies any on body medical injector devices. EXAM: CERVICAL SPINE - COMPLETE 4+ VIEW COMPARISON:  Cervical spine radiographs, 05/04/2013 FINDINGS: No fracture, bone lesion or spondylolisthesis. Status post posterior cervical spine fusion, C3 through C7. The  pedicle screws and interconnecting rods are well positioned and stable from the prior exam. The discs are relatively well maintained in height. There are anterior endplate spurs most evident at C5-C6 and C6-C7. Soft tissues are unremarkable other than left carotid artery calcifications. IMPRESSION: 1. No fracture, bone lesion or spondylolisthesis. 2. Well-positioned orthopedic hardware from the previous posterior fusion, C3 through C7. No evidence of loosening of the orthopedic hardware. Electronically Signed   By: Lajean Manes M.D.   On: 01/29/2019 06:34    EKG: Independently reviewed.  No acute ischemic changes appreciated, normal sinus rhythm.  Normal QT.  Assessment/Plan 1-chest pain -With a heart score of 4  -No acute ischemic changes appreciated daily -Mild initial troponin elevation -Chest pain improved with the use of aspirin and nitroglycerin -Cycle EKG, troponin and monitor on telemetry -cardiology consult, as given risk factor might benefit for further inpatient stratification. -will start patient  on ASA, no b-blocker given bradycardia. -checking TSH, lipid panel, 2-D echo and A1C  2-type 2 diabetes mellitus -Follow CBGs with intentions to cover with insulin if  blood sugar above 200.   -check A1c -Continue modified carbhydrates  3-Essential HTN -continue lisinopril  4-hypokalemia -due to diuretic usage -will hold diuretics overnight -Provide electrolyte repletion -Check magnesium level -Patient will be monitored on telemetry.  5-dyslipidemia -Continue fish oil -check lipid panel.  6-GERD -continue PPI   DVT prophylaxis: heparin  Code Status: Full Code Family Communication: no family at bedside Disposition Plan: home once medically stable and ACS rule out Consults called: cardiology service. Admission status: telemetry bed, observation, LOS < 2 midnights.   Time Spent: 65 minutes.  Barton Dubois MD [facility] Pager [phone]  01/29/2019, 7:08  PM

## 2019-01-29 NOTE — ED Provider Notes (Signed)
[facility] EMERGENCY DEPARTMENT Provider Note   CSN: [id number] Arrival date & time: 01/29/19  0400  Time seen 5:35 AM  History   Chief Complaint Chief Complaint  Patient presents with   Chest Pain    HPI Douglas Keller is a 78 y.o. male.     HPI patient states July 10 he started having some pain in the center of his chest that he thought was gas and states it was sharp and burning.  He did not have burning reflux fluid.  He denies abdominal bloating.  He states he had nausea.  He states it lasted about an hour.  He states he went to his primary care doctor's office and they told him the doctor which is time to go to the ED.  He has had no chest pain since that time.  He states he was fine Saturday except for sweating a lot.  At 3 AM this morning he woke up and he denies having any chest pain but he has some pain in his right arm that goes down into the forearm and also in the right side of his neck.  He states the pain in his arm and neck are not connected.  They are both described as sharp and each will last 4 to 5 seconds at a time.  He states movement makes the pains worse.  He has chronic numbness of his arms and legs from neuropathy.  He denies any new numbness, weakness, headache, visual changes, but does state he felt like he had a irregular heart rate this morning for about 30 minutes.  He states he is never had that before.  He also states that on July 10 when he had the chest pain he felt like his heart was racing.  He states he is never had any heart problems before.  He states both his parents had heart problems, there is no family history of strokes.  He states that he has had cervical spine fusion and normally that pain in his neck is described as aching and stiffness.  This neck pain is different.  He is also been evaluated recently for dizzy spells.  He states he has had these chest pains before but he always thought they were from gas.  PCP Sharilyn Sites, MD  Past Medical  History:  Diagnosis Date   Aortic valve sclerosis    Atherosclerosis of coronary artery without angina pectoris 07/05/2014   Diabetes mellitus type 2, diet-controlled ([place])    HTN (hypertension) 07/06/2013   Hyperlipidemia 07/06/2013    Patient Active Problem List   Diagnosis Date Noted   Diabetes mellitus type 2 in obese ([place]) 09/01/2018   Heterozygous familial hypercholesterolemia 09/01/2018   Hypercholesterolemia 08/21/2015   Medication management 08/21/2015   Atherosclerosis of coronary artery without angina pectoris 07/05/2014   Stiffness of left shoulder joint 07/19/2013   Hyperlipidemia 07/06/2013   HTN (hypertension) 07/06/2013   Cervical spondylosis without myelopathy 02/13/2013   Degeneration of cervical intervertebral disc 02/13/2013   Spinal stenosis in cervical region 02/13/2013    Past Surgical History:  Procedure Laterality Date   CERVICAL SPINE SURGERY     COLONOSCOPY  07/26/2012   Procedure: COLONOSCOPY;  Surgeon: Jamesetta So, MD;  Location: [facility];  Service: Gastroenterology;  Laterality: N/A;   KNEE ARTHROSCOPY          Home Medications    Prior to Admission medications   Medication Sig Start Date End Date Taking? Authorizing Provider  acetaminophen (  TYLENOL) 500 MG tablet Take 500 mg by mouth 2 (two) times daily.   Yes [provider]  amLODipine (NORVASC) 10 MG tablet Take 1 tablet (10 mg total) by mouth daily. 12/30/12  Yes Croitoru, Mihai, MD  aspirin EC 81 MG tablet Take 81 mg by mouth daily.   Yes [provider]  chlorthalidone (HYGROTON) 25 MG tablet TAKE 1/2 TABLET EVERY DAY 12/14/18  Yes Croitoru, Mihai, MD  Evolocumab (REPATHA SURECLICK) 371 MG/ML SOAJ Inject 140 mg into the skin every 14 (fourteen) days. 05/17/18  Yes Croitoru, Mihai, MD  GLUCOSAMINE PO Take 1 tablet by mouth at bedtime.   Yes [provider]  KLOR-CON M20 20 MEQ tablet Take 1 tablet (20 mEq total) by mouth daily. 01/05/19   Yes Croitoru, Mihai, MD  lisinopril (PRINIVIL,ZESTRIL) 40 MG tablet Take 1 tablet (40 mg total) by mouth daily. 12/30/12  Yes Croitoru, Mihai, MD  Multiple Vitamins-Minerals (AIRBORNE PO) Take 1 each by mouth daily.   Yes [provider]  Omega-3 Fatty Acids (FISH OIL) 1000 MG CAPS Take 1 capsule by mouth daily.   Yes [provider]    Family History Family History  Problem Relation Age of Onset   Asthma Mother    Hypertension Father    Colon cancer Neg Hx     Social History Social History   Tobacco Use   Smoking status: Never Smoker   Smokeless tobacco: Never Used  Substance Use Topics   Alcohol use: No   Drug use: No     Allergies   Crestor [rosuvastatin]   Review of Systems Review of Systems  All other systems reviewed and are negative.    Physical Exam Updated Vital Signs BP 138/75    Pulse (!) 57    Resp 20    SpO2 100%   Physical Exam Vitals signs and nursing note reviewed.  Constitutional:      General: He is not in acute distress.    Appearance: Normal appearance. He is well-developed. He is not ill-appearing or toxic-appearing.  HENT:     Head: Normocephalic and atraumatic.     Right Ear: External ear normal.     Left Ear: External ear normal.     Nose: Nose normal. No mucosal edema or rhinorrhea.     Mouth/Throat:     Mouth: Mucous membranes are moist.     Dentition: No dental abscesses.     Pharynx: No uvula swelling.  Eyes:     Extraocular Movements: Extraocular movements intact.     Conjunctiva/sclera: Conjunctivae normal.     Pupils: Pupils are equal, round, and reactive to light.  Neck:     Musculoskeletal: Full passive range of motion without pain, normal range of motion and neck supple. Muscular tenderness present.      Comments: Patient has tenderness on the right side of his neck where it attaches to the skull.  He has pain when he turns his head to the right, he does not have pain when he turns his head to the  left.  He has some minor discomfort in the trapezius between the shoulder and the in the neck. Cardiovascular:     Rate and Rhythm: Regular rhythm. Bradycardia present.     Heart sounds: Normal heart sounds. No murmur. No friction rub. No gallop.   Pulmonary:     Effort: Pulmonary effort is normal. No respiratory distress.     Breath sounds: Normal breath sounds. No wheezing, rhonchi or rales.  Chest:     Chest wall: No tenderness or crepitus.  Abdominal:     General: Bowel sounds are normal. There is no distension.     Palpations: Abdomen is soft.     Tenderness: There is no abdominal tenderness. There is no guarding or rebound.  Musculoskeletal: Normal range of motion.        General: No tenderness.     Comments: Moves all extremities well.   Skin:    General: Skin is warm and dry.     Coloration: Skin is not pale.     Findings: No erythema or rash.  Neurological:     General: No focal deficit present.     Mental Status: He is alert and oriented to person, place, and time.     Cranial Nerves: No cranial nerve deficit.     Comments: Grips are equal, there is no pronator drift.  Psychiatric:        Mood and Affect: Mood normal. Mood is not anxious.        Speech: Speech normal.        Behavior: Behavior normal.        Thought Content: Thought content normal.      ED Treatments / Results  Labs (all labs ordered are listed, but only abnormal results are displayed) Results for orders placed or performed during the hospital encounter of 78/93/81  Basic metabolic panel  Result Value Ref Range   Sodium 141 135 - 145 mmol/L   Potassium 2.8 (L) 3.5 - 5.1 mmol/L   Chloride 108 98 - 111 mmol/L   CO2 24 22 - 32 mmol/L   Glucose, Bld 123 (H) 70 - 99 mg/dL   BUN 14 8 - 23 mg/dL   Creatinine, Ser 0.94 0.61 - 1.24 mg/dL   Calcium 8.2 (L) 8.9 - 10.3 mg/dL   GFR calc non Af Amer >60 >60 mL/min   GFR calc Af Amer >60 >60 mL/min   Anion gap 9 5 - 15  CBC  Result Value Ref Range    WBC 7.6 4.0 - 10.5 K/uL   RBC 4.39 4.22 - 5.81 MIL/uL   Hemoglobin 13.3 13.0 - 17.0 g/dL   HCT 40.1 39.0 - 52.0 %   MCV 91.3 80.0 - 100.0 fL   MCH 30.3 26.0 - 34.0 pg   MCHC 33.2 30.0 - 36.0 g/dL   RDW 13.2 11.5 - 15.5 %   Platelets 275 150 - 400 K/uL   nRBC 0.0 0.0 - 0.2 %  Troponin I (High Sensitivity)  Result Value Ref Range   Troponin I (High Sensitivity) 29.00 (H) <18 ng/L  Troponin I (High Sensitivity)  Result Value Ref Range   Troponin I (High Sensitivity) 32.00 (H) <18 ng/L   Laboratory interpretation all normal except hypokalemia despite being on potassium pills, positive troponin    EKG EKG Interpretation  Date/Time:  Sunday January 29 2019 04:11:15 EDT Ventricular Rate:  68 PR Interval:    QRS Duration: 111 QT Interval:  426 QTC Calculation: 454 R Axis:   -7 Text Interpretation:  Sinus rhythm Prolonged PR interval Abnormal R-wave progression, late transition No significant change since last tracing 21 Oct 2018 Confirmed by Rolland Porter [phone]) on 01/29/2019 6:49:35 AM   Radiology Dg Cervical Spine Complete  Result Date: 01/29/2019 CLINICAL DATA:  Pt states he has had centralized cp and neck pain that radiates down his right arm onset since Friday. Pt denies any sob or injury to neck. Pt has  hx of diabetes and hypertension. Pt denies any on body medical injector devices. EXAM: CERVICAL SPINE - COMPLETE 4+ VIEW COMPARISON:  Cervical spine radiographs, 05/04/2013 FINDINGS: No fracture, bone lesion or spondylolisthesis. Status post posterior cervical spine fusion, C3 through C7. The pedicle screws and interconnecting rods are well positioned and stable from the prior exam. The discs are relatively well maintained in height. There are anterior endplate spurs most evident at C5-C6 and C6-C7. Soft tissues are unremarkable other than left carotid artery calcifications. IMPRESSION: 1. No fracture, bone lesion or spondylolisthesis. 2. Well-positioned orthopedic hardware from the  previous posterior fusion, C3 through C7. No evidence of loosening of the orthopedic hardware. Electronically Signed   By: Lajean Manes M.D.   On: 01/29/2019 06:34    Procedures Procedures (including critical care time)  Medications Ordered in ED Medications  potassium chloride SA (K-DUR) CR tablet 40 mEq (40 mEq Oral Given 01/29/19 0557)  nitroGLYCERIN (NITROGLYN) 2 % ointment 1 inch (1 inch Topical Given 01/29/19 0557)  aspirin tablet 325 mg (325 mg Oral Given 01/29/19 0557)     Initial Impression / Assessment and Plan / ED Course  I have reviewed the triage vital signs and the nursing notes.  Pertinent labs & imaging results that were available during my care of the patient were reviewed by me and considered in my medical decision making (see chart for details).    Patient's blood pressure was little bit elevated, nitroglycerin paste was placed and he was given a full dose aspirin.  His initial troponin is a little bit elevated.  Surgical spine films were done to evaluate his neck pain.   Patient's delta troponin does show an increase of about 3.  I have talked to the patient he is agreeable for ops admission and cardiology consult.  I talked to the patient about admission and he is agreeable.  He states his pain is better after the nitroglycerin paste and the aspirin.  7:38 AM Dr. Dyann Kief, hospitalist will admit.  Final Clinical Impressions(s) / ED Diagnoses   Final diagnoses:  Chest pain, unspecified type  Elevated troponin  Neck pain    Plan admission  Rolland Porter, MD, Barbette Or, MD 01/29/19 [phone]

## 2019-01-29 NOTE — Progress Notes (Signed)
*  PRELIMINARY RESULTS* Echocardiogram 2D Echocardiogram has been performed.  Douglas Keller 01/29/2019, 9:55 AM

## 2019-01-29 NOTE — ED Triage Notes (Signed)
Pt arrives from home c/o intermittent chest pain that radiates down right arm. Sharp Pain began Friday. Pt reports pain is in central chest.

## 2019-01-30 ENCOUNTER — Encounter (HOSPITAL_COMMUNITY): Payer: Self-pay

## 2019-01-30 ENCOUNTER — Observation Stay (HOSPITAL_BASED_OUTPATIENT_CLINIC_OR_DEPARTMENT_OTHER): Payer: Medicare HMO

## 2019-01-30 DIAGNOSIS — K219 Gastro-esophageal reflux disease without esophagitis: Secondary | ICD-10-CM | POA: Diagnosis not present

## 2019-01-30 DIAGNOSIS — R002 Palpitations: Secondary | ICD-10-CM | POA: Diagnosis not present

## 2019-01-30 DIAGNOSIS — R7989 Other specified abnormal findings of blood chemistry: Secondary | ICD-10-CM | POA: Diagnosis not present

## 2019-01-30 DIAGNOSIS — E876 Hypokalemia: Secondary | ICD-10-CM | POA: Diagnosis not present

## 2019-01-30 DIAGNOSIS — I499 Cardiac arrhythmia, unspecified: Secondary | ICD-10-CM

## 2019-01-30 DIAGNOSIS — I25118 Atherosclerotic heart disease of native coronary artery with other forms of angina pectoris: Secondary | ICD-10-CM | POA: Diagnosis not present

## 2019-01-30 DIAGNOSIS — E785 Hyperlipidemia, unspecified: Secondary | ICD-10-CM | POA: Diagnosis not present

## 2019-01-30 DIAGNOSIS — R079 Chest pain, unspecified: Secondary | ICD-10-CM

## 2019-01-30 DIAGNOSIS — E1169 Type 2 diabetes mellitus with other specified complication: Secondary | ICD-10-CM

## 2019-01-30 DIAGNOSIS — I10 Essential (primary) hypertension: Secondary | ICD-10-CM | POA: Diagnosis not present

## 2019-01-30 DIAGNOSIS — R778 Other specified abnormalities of plasma proteins: Secondary | ICD-10-CM

## 2019-01-30 DIAGNOSIS — E119 Type 2 diabetes mellitus without complications: Secondary | ICD-10-CM

## 2019-01-30 LAB — BASIC METABOLIC PANEL
Anion gap: 10 (ref 5–15)
BUN: 10 mg/dL (ref 8–23)
CO2: 26 mmol/L (ref 22–32)
Calcium: 8.3 mg/dL — ABNORMAL LOW (ref 8.9–10.3)
Chloride: 107 mmol/L (ref 98–111)
Creatinine, Ser: 1 mg/dL (ref 0.61–1.24)
GFR calc Af Amer: >60 mL/min (ref >60)
GFR calc non Af Amer: >60 mL/min (ref >60)
Glucose, Bld: 114 mg/dL — ABNORMAL HIGH (ref 70–99)
Potassium: 3.2 mmol/L — ABNORMAL LOW (ref 3.5–5.1)
Sodium: 143 mmol/L (ref 135–145)

## 2019-01-30 LAB — MAGNESIUM: Magnesium: 2.1 mg/dL (ref 1.7–2.4)

## 2019-01-30 LAB — NM MYOCAR MULTI W/SPECT W/WALL MOTION / EF
LV dias vol: 149 mL (ref 62–150)
LV sys vol: 79 mL
Peak HR: 108 {beats}/min
RATE: 0.33
Rest HR: 52 {beats}/min
SDS: 3
SRS: 1
SSS: 4
TID: 1.05

## 2019-01-30 LAB — GLUCOSE, CAPILLARY
Glucose-Capillary: 118 mg/dL — ABNORMAL HIGH (ref 70–99)
Glucose-Capillary: 134 mg/dL — ABNORMAL HIGH (ref 70–99)
Glucose-Capillary: 124 mg/dL — ABNORMAL HIGH (ref 70–99)

## 2019-01-30 MED ORDER — SODIUM CHLORIDE 0.9% FLUSH
INTRAVENOUS | Status: AC
  Administered 2019-01-30: 10 mL via INTRAVENOUS
  Filled 2019-01-30: qty 10

## 2019-01-30 MED ORDER — TECHNETIUM TC 99M TETROFOSMIN IV KIT
10.0000 | PACK | Freq: Once | INTRAVENOUS | Status: AC | PRN
  Administered 2019-01-30: 10:00:00 10.1 via INTRAVENOUS

## 2019-01-30 MED ORDER — REGADENOSON 0.4 MG/5ML IV SOLN
INTRAVENOUS | Status: AC
  Filled 2019-01-30: qty 5

## 2019-01-30 MED ORDER — POTASSIUM CHLORIDE CRYS ER 20 MEQ PO TBCR
20.0000 meq | EXTENDED_RELEASE_TABLET | Freq: Every day | ORAL | Status: DC
  Administered 2019-01-30: 20 meq via ORAL
  Filled 2019-01-30: qty 1

## 2019-01-30 MED ORDER — REGADENOSON 0.4 MG/5ML IV SOLN
0.4000 mg | Freq: Once | INTRAVENOUS | Status: AC
  Administered 2019-01-30: 0.4 mg via INTRAVENOUS
  Filled 2019-01-30: qty 5

## 2019-01-30 MED ORDER — AMLODIPINE BESYLATE 5 MG PO TABS
5.0000 mg | ORAL_TABLET | Freq: Every day | ORAL | Status: DC
  Administered 2019-01-30: 5 mg via ORAL
  Filled 2019-01-30: qty 1

## 2019-01-30 MED ORDER — KLOR-CON M20 20 MEQ PO TBCR: 40.0000 meq | EXTENDED_RELEASE_TABLET | Freq: Every day | ORAL | 0 refills | Status: DC

## 2019-01-30 MED ORDER — POTASSIUM CHLORIDE CRYS ER 20 MEQ PO TBCR
40.0000 meq | EXTENDED_RELEASE_TABLET | Freq: Once | ORAL | Status: AC
  Administered 2019-01-30: 10:00:00 40 meq via ORAL
  Filled 2019-01-30: qty 2

## 2019-01-30 MED ORDER — TECHNETIUM TC 99M TETROFOSMIN IV KIT
30.0000 | PACK | Freq: Once | INTRAVENOUS | Status: AC
  Administered 2019-01-30: 30 via INTRAVENOUS

## 2019-01-30 MED ORDER — PANTOPRAZOLE SODIUM 40 MG PO TBEC: 40.0000 mg | DELAYED_RELEASE_TABLET | Freq: Every day | ORAL | 1 refills | Status: DC

## 2019-01-30 NOTE — Care Management Obs Status (Signed)
[place] NOTIFICATION   Patient Details  Name: Douglas Keller MRN: [id number] Date of Birth: 1941/06/23   Medicare Observation Status Notification Given:  Yes    Tommy Medal 01/30/2019, 2:15 PM

## 2019-01-30 NOTE — Consult Note (Signed)
Cardiology Consultation:   [name] MRN: [id number]; DOB: Jun 04, 1941  Admit date: 01/29/2019 Date of Consult: 01/30/2019  Primary Care Provider: Sharilyn Sites, MD Primary Cardiologist: Sanda Klein, MD  Primary Electrophysiologist:  None    Patient Profile:   Douglas Keller is a 78 y.o. male with a hx of pretension, hyperlipidemia on Repatha, moderate coronary atherosclerosis and aortic valve sclerosis without stenosis Who is being seen today for the evaluation of chest pain at the request of Dr. Dyann Kief.  History of Present Illness:   Douglas Keller is a 78 year old male patient of Dr. Sallyanne Kuster who was last seen by him 08/2018.  He has a history of hypertension, HLD, moderate coronary atherosclerosis and aortic valve sclerosis, DM diet controlled.  He has been intolerant to statins and is now on Repatha.  LDL 61. Triglycerides elevated.  Cardiac catheterization 2006 showed nonobstructive CAD with 40% proximal LAD and 40% mid RCA.  Nuclear stress test 2010 normal perfusion.  2D echo normal systolic function with aortic valve sclerosis.  NST 2018 LVEF was 53 % but he had no signs of CHF.  Has long history of dizziness and near syncope since Cspine surgery 2014-evaluated by neurologist at [facility]. Was going through vestibular therapy for vertigo.  Patient said his heart was racing on Friday and he went to PCP and was told to go to the hospital. He didn't because it settled down and hospital was too busy. Sunday he developed heart racing, dizziness, and right arm pain with some chest tightness so came to ER. Troponins flat but elevated at 29, 27 and 25.  Potassium 2.8, now 3.2. Had a run of ventricular bigeminy this am-asymptomatic. Was riding exercise bike up until 3 months ago. Now does yard work and [employer] without chest pain but no regular exercise. Main complaint is heart racing and dizziness.  Heart Pathway Score:     Past Medical History:  Diagnosis Date  . Aortic valve  sclerosis   . Atherosclerosis of coronary artery without angina pectoris 07/05/2014  . Diabetes mellitus type 2, diet-controlled ([place])   . HTN (hypertension) 07/06/2013  . Hyperlipidemia 07/06/2013    Past Surgical History:  Procedure Laterality Date  . CERVICAL SPINE SURGERY    . COLONOSCOPY  07/26/2012   Procedure: COLONOSCOPY;  Surgeon: Jamesetta So, MD;  Location: [facility];  Service: Gastroenterology;  Laterality: N/A;  . KNEE ARTHROSCOPY       Home Medications:  Prior to Admission medications   Medication Sig Start Date End Date Taking? Authorizing Provider  acetaminophen (TYLENOL) 500 MG tablet Take 500 mg by mouth 2 (two) times daily.   Yes [provider]  amLODipine (NORVASC) 10 MG tablet Take 1 tablet (10 mg total) by mouth daily. 12/30/12  Yes Croitoru, Mihai, MD  aspirin EC 81 MG tablet Take 81 mg by mouth daily.   Yes [provider]  chlorthalidone (HYGROTON) 25 MG tablet TAKE 1/2 TABLET EVERY DAY 12/14/18  Yes Croitoru, Mihai, MD  Evolocumab (REPATHA SURECLICK) 409 MG/ML SOAJ Inject 140 mg into the skin every 14 (fourteen) days. 05/17/18  Yes Croitoru, Mihai, MD  GLUCOSAMINE PO Take 1 tablet by mouth at bedtime.   Yes [provider]  KLOR-CON M20 20 MEQ tablet Take 1 tablet (20 mEq total) by mouth daily. 01/05/19  Yes Croitoru, Mihai, MD  lisinopril (PRINIVIL,ZESTRIL) 40 MG tablet Take 1 tablet (40 mg total) by mouth daily. 12/30/12  Yes Croitoru, Mihai, MD  Multiple Vitamins-Minerals (AIRBORNE PO) Take  1 each by mouth daily.   Yes [provider]  Omega-3 Fatty Acids (FISH OIL) 1000 MG CAPS Take 1 capsule by mouth daily.   Yes [provider]    Inpatient Medications: Scheduled Meds: . aspirin EC  81 mg Oral Daily  . heparin  5,000 Units Subcutaneous Q8H  . insulin aspart  0-5 Units Subcutaneous QHS  . insulin aspart  0-9 Units Subcutaneous TID WC  . lisinopril  40 mg Oral Daily  . omega-3 acid ethyl esters  1 g Oral  BID  . pantoprazole  40 mg Oral Daily   Continuous Infusions: . 0.9 % NaCl with KCl 20 mEq / L 50 mL/hr at 01/29/19 1256   PRN Meds: acetaminophen, ondansetron (ZOFRAN) IV  Allergies:    Allergies  Allergen Reactions  . Crestor [Rosuvastatin] Other (See Comments)    Cramps     Social History:   Social History   Socioeconomic History  . Marital status: Widowed    Spouse name: Not on file  . Number of children: Not on file  . Years of education: Not on file  . Highest education level: Not on file  Occupational History  . Not on file  Social Needs  . Financial resource strain: Not on file  . Food insecurity    Worry: Not on file    Inability: Not on file  . Transportation needs    Medical: Not on file    Non-medical: Not on file  Tobacco Use  . Smoking status: Never Smoker  . Smokeless tobacco: Never Used  Substance and Sexual Activity  . Alcohol use: No  . Drug use: No  . Sexual activity: Not on file  Lifestyle  . Physical activity    Days per week: Not on file    Minutes per session: Not on file  . Stress: Not on file  Relationships  . Social Herbalist on phone: Not on file    Gets together: Not on file    Attends religious service: Not on file    Active member of club or organization: Not on file    Attends meetings of clubs or organizations: Not on file    Relationship status: Not on file  . Intimate partner violence    Fear of current or ex partner: Not on file    Emotionally abused: Not on file    Physically abused: Not on file    Forced sexual activity: Not on file  Other Topics Concern  . Not on file  Social History Narrative  . Not on file    Family History:     Family History  Problem Relation Age of Onset  . Asthma Mother   . Hypertension Father   . Colon cancer Neg Hx      ROS:  Please see the history of present illness.  Review of Systems  Constitution: Negative.  HENT: Negative.   Cardiovascular: Positive for chest  pain and palpitations.  Respiratory: Negative.   Endocrine: Negative.   Hematologic/Lymphatic: Negative.   Musculoskeletal: Negative.   Gastrointestinal: Negative.   Genitourinary: Negative.   Neurological: Positive for dizziness.    All other ROS reviewed and negative.     Physical Exam/Data:   Vitals:   01/30/19 0203 01/30/19 0205 01/30/19 0610 01/30/19 0750  BP:  (!) 142/65 (!) 161/84   Pulse: (!) 55 (!) 55 (!) 58   Resp:  20 20   Temp:  98.5 F (36.9 C)  98.4 F (36.9 C)   TempSrc:  Oral Oral   SpO2: 99% 99% 99% 98%    Intake/Output Summary (Last 24 hours) at 01/30/2019 0841 Last data filed at 01/30/2019 0631 Gross per 24 hour  Intake 1155.95 ml  Output 500 ml  Net 655.95 ml   Last 3 Weights 10/21/2018 09/01/2018 02/17/2018  Weight (lbs) 200 lb 208 lb 3.2 oz 200 lb  Weight (kg) 90.719 kg 94.439 kg 90.719 kg     There is no height or weight on file to calculate BMI.  General:  Well nourished, well developed, in no acute distress  HEENT: normal Lymph: no adenopathy Neck: no JVD Endocrine:  No thryomegaly Vascular: No carotid bruits; FA pulses 2+ bilaterally without bruits  Cardiac:  normal S1, S2; GQQ;7/6 systolic murmur LSB and apex Lungs:  clear to auscultation bilaterally, no wheezing, rhonchi or rales  Abd: soft, nontender, no hepatomegaly  Ext: no edema Musculoskeletal:  No deformities, BUE and BLE strength normal and equal Skin: warm and dry  Neuro:  CNs 2-12 intact, no focal abnormalities noted Psych:  Normal affect   EKG:  The EKG was personally reviewed and demonstrates: Sinus bradycardia at 58 bpm with poor R wave progression anteriorly unchanged from prior tracings Telemetry:  Telemetry was personally reviewed and demonstrates:  Sinus bradycardia in 50's with short run of ventricular bigeminy this am  Relevant CV Studies:  NST 2018The left ventricular ejection fraction is mildly decreased (45-54%).  Nuclear stress EF: 53%.  Blood pressure  demonstrated a normal response to exercise.  There was no ST segment deviation noted during stress.  The study is normal.  This is a low risk study.    30-day event monitor 201830-day event monitor shows the dominant rhythm to be sinus bradycardia. On all days the average heart rate is in the mid 50-low 60 range.  62% of the time the patient's rhythm is sinus bradycardia. Tachycardia is virtually never seen.  There are rare PVCs.  There is no clear correlation between the patient's symptomatic events in the rhythm at the time of recording. Patient abnormal 30-day event monitor due to the persistent mild sinus bradycardia. No other serious arrhythmias are seen. She had a recent   Arterial ABIs 2/18/2020IMPRESSION: Resting ABI of the bilateral lower extremities within normal limits.   Segmental exam demonstrates no evidence of significant arterial occlusive disease     Laboratory Data:  High Sensitivity Troponin:   Recent Labs  Lab 01/29/19 0427 01/29/19 0621 01/29/19 0945 01/29/19 1149 01/29/19 1405  TROPONINIHS 29.00* 32.00* 29.00* 27.00* 25.00*     Cardiac EnzymesNo results for input(s): TROPONINI in the last 168 hours. No results for input(s): TROPIPOC in the last 168 hours.  Chemistry Recent Labs  Lab 01/29/19 0427 01/30/19 0524  NA 141 143  K 2.8* 3.2*  CL 108 107  CO2 24 26  GLUCOSE 123* 114*  BUN 14 10  CREATININE 0.94 1.00  CALCIUM 8.2* 8.3*  GFRNONAA >60 >60  GFRAA >60 >60  ANIONGAP 9 10    No results for input(s): PROT, ALBUMIN, AST, ALT, ALKPHOS, BILITOT in the last 168 hours. Hematology Recent Labs  Lab 01/29/19 0427  WBC 7.6  RBC 4.39  HGB 13.3  HCT 40.1  MCV 91.3  MCH 30.3  MCHC 33.2  RDW 13.2  PLT 275   BNPNo results for input(s): BNP, PROBNP in the last 168 hours.  DDimer No results for input(s): DDIMER in the last 168 hours.   Radiology/Studies:  Dg Cervical Spine Complete  Result Date: 01/29/2019 CLINICAL DATA:  Pt states  he has had centralized cp and neck pain that radiates down his right arm onset since Friday. Pt denies any sob or injury to neck. Pt has hx of diabetes and hypertension. Pt denies any on body medical injector devices. EXAM: CERVICAL SPINE - COMPLETE 4+ VIEW COMPARISON:  Cervical spine radiographs, 05/04/2013 FINDINGS: No fracture, bone lesion or spondylolisthesis. Status post posterior cervical spine fusion, C3 through C7. The pedicle screws and interconnecting rods are well positioned and stable from the prior exam. The discs are relatively well maintained in height. There are anterior endplate spurs most evident at C5-C6 and C6-C7. Soft tissues are unremarkable other than left carotid artery calcifications. IMPRESSION: 1. No fracture, bone lesion or spondylolisthesis. 2. Well-positioned orthopedic hardware from the previous posterior fusion, C3 through C7. No evidence of loosening of the orthopedic hardware. Electronically Signed   By: Lajean Manes M.D.   On: 01/29/2019 06:34    Assessment and Plan:   1. Chest pain with mildly elevated troponins, flat, EKG unchanged-symptoms precipitated by palpitations.Will order lexiscan myoview. 2. Palpitations with dizziness-K 2.8 on admission and ventricular bigeminy on telemetry this am. Replace K. Will need 30 day monitor as outpatient 3. History of nonobstructive CAD on cath in 2000 640% LAD and 40% RCA 4. Essential hypertension well-controlled on lisinopril 40 mg daily, chlorthalidone 12.5 mg daily, amlodipine 10 mg daily. Resume amlodipine here as BP climbing 5. Hyperlipidemia on Repatha LDL 61 6. Diabetes mellitus diet controlled A1c pending 7. Hypokalemia potassium 2.8 has been replaced but 3.2 today      For questions or updates, please contact [place] Please consult [email or web address] for contact info under     Signed, Ermalinda Barrios, PA-C  01/30/2019 8:41 AM  The patient was seen and examined, and I agree with the history, physical exam,  assessment and plan as documented above, with modifications as noted below. I have also personally reviewed all relevant documentation, old records, labs, and both radiographic and cardiovascular studies. I have also independently interpreted old and new ECG's.  Briefly, this is a 78 year old male with a history of nonobstructive coronary artery disease.  He was about to go golfing last Friday and then experienced palpitations and nausea.  He was instructed by his PCP to go to the ED.  The patient, he drove by the hospital and saw it was crowded so he went back home.  Yesterday he experienced palpitations and precordial chest discomfort with radiation and associated tingling and numbness of the right arm.  Troponins have been in the 20s range.  He is on chlorthalidone and supplemental potassium but potassium level was low at 2.8 and after supplementation is up to 3.2.  He did have some PVCs and ventricular bigeminy although he has been asymptomatic.  His last nuclear stress test was in 2018 and was normal. ECG which I personally reviewed demonstrates sinus bradycardia, 58 bpm, with late R wave transition, nonspecific T wave abnormalities, and nonspecific IVCD.  In order to rule out ischemic heart disease, we will obtain a Lexiscan Myoview today.  I would aim to keep potassium greater than 4 to increase arrhythmic threshold.  I would recommend an outpatient 30-day event monitor, although palpitations and ventricular bigeminy were likely triggered by hypokalemia.   Kate Sable, MD, [facility]  01/30/2019 9:40 AM

## 2019-01-30 NOTE — Progress Notes (Signed)
Pt expressed irritation waiting on d/c orders. Explained orders  are pending. Given Microwave dinner. Pt removed telemetry box earlier this afternoon.

## 2019-01-30 NOTE — Discharge Summary (Signed)
Physician Discharge Summary  Douglas Keller FEO:[id number] DOB: May 17, 1941 DOA: 01/29/2019  PCP: Sharilyn Sites, MD  Admit date: 01/29/2019 Discharge date: 01/30/2019  Time spent: 35 minutes  Recommendations for Outpatient Follow-up:  1. Repeat basic metabolic panel to follow electrolytes and renal function 2. Reassess CBGs and A1c closely to decide need of hypoglycemic regimen initiation.   Discharge Diagnoses:  Active Problems:   Type 2 diabetes mellitus with hyperlipidemia (HCC)   Chest pain   Elevated troponin   Gastroesophageal reflux disease   Dyslipidemia   Discharge Condition: Stable and improved.  Patient discharged home with instruction to follow-up with PCP and with cardiology service as instructed.  Diet recommendation: Heart healthy modified carbohydrate diet  History of present illness:  78 y.o. male with a past medical history significant for aortic valve sclerosis, hyperlipidemia, hypertension and, type 2 diabetes (diet controlled currently); who presented to the emergency department secondary to chest pain.  Patient report pain has been present for the last 2 days prior to admission, localized in the middle of his chest with partial radiation to left and right side and on the day of admission extending towards his right arm; pain is intermittent, pressure type in nature, 7 out of 10 in intensity, associated with nausea, palpitation and sweating.  Patient denies fevers, chills, vomiting, dysuria, hematuria, melena, hematochezia, coughing spells, orthopnea, focal weakness, sick contacts or any other complaints. In the emergency department patient vital signs were overall stable, EKG without acute ischemic changes, mildly elevated troponin and improvement/resolution of her pain with the use of aspirin and nitroglycerin.  Given patient risk factors (heart score of 4), TRH was consulted to place patient in observation for further evaluation, management and ACS rule  out.Marland Kitchen  Hospital Course:  1-chest pain -Patient risk factors including hypertension, hyperlipidemia, diabetes -Mildly elevation in his troponin, which remained flat -No further chest pain -No acute ischemic changes on EKG or telemetry -Reassuring 2D echo results (preserved ejection fraction and no wall motion abnormalities). -Low risk probability Lexiscan Myoview. -Patient will be discharged on aspirin, continue treatment for his dyslipidemia and outpatient follow-up with cardiology service. -He has remained chest pain-free since admission -Will continue the use of PPI at discharge.  2-dyslipidemia -Continue fish oil and Evolocumab -Triglycerides 152 and HDL 40 -LDL 61. -Patient with prior history of allergy reaction to statins.  3-essential hypertension -Blood pressure stable -Continue current antihypertensive regimen  4-type 2 diabetes: Diet controlled -A1c 6.5 -Continue modified carbohydrate diet and outpatient follow-up with PCP to further decide initiation of hypoglycemic regimen if needed.  5-GERD -As mentioned above will treat with PPI  6-hypokalemia -In the setting of diuretic usage -Repleted -Patient chronic daily supplementation of potassium has been adjusted to 40 mEq daily -Recommend repeat basic metabolic panel follow-up visit to reassess electrolytes trend.  Procedures:  See below for x-ray reports  Lexiscan Myoview: Low risk probability.  2D echo:  1. The left ventricle has mildly reduced systolic function, with an ejection fraction of 45-50%. The cavity size was normal. There is mildly increased left ventricular wall thickness. Left ventricular diastolic Doppler parameters are consistent with  impaired relaxation. Elevated left ventricular end-diastolic pressure The E/e' is 15.1.  2. Probable lateral wall motion abnormality with moderate hypokinesis. LV endocardial border is not optimally visualized in traditional views and LV is foreshortened, therefore  exact localization of wall motion abnormalities challenging to discern.  3. The right ventricle has normal systolic function. The cavity was normal. There is no increase in right ventricular  wall thickness.  4. The aortic valve is abnormal. Mild calcification of the aortic valve. No stenosis of the aortic valve. Mild aortic annular calcification noted. Consultations:  Cardiology service  Discharge Exam: Vitals:   01/30/19 0750 01/30/19 1322  BP:  133/64  Pulse:  66  Resp:  20  Temp:  98.5 F (36.9 C)  SpO2: 98% 99%    General: Afebrile, no chest pain, no nausea, no vomiting.  No acute distress present at this time. Cardiovascular: S1 and S2, no rubs, no gallops, positive systolic ejection murmur.  No JVD. Respiratory: Clear to auscultation bilaterally. Abdomen: Soft, nontender, nondistended, positive bowel sounds Extremities: No cyanosis, no clubbing, no edema.  Discharge Instructions   Discharge Instructions    Diet - low sodium heart healthy   Complete by: As directed    Discharge instructions   Complete by: As directed    Take medications as prescribed Arrange follow-up with PCP in 2 weeks Follow-up with cardiology service as instructed Follow heart healthy diet Maintain adequate hydration     Allergies as of 01/30/2019      Reactions   Crestor [rosuvastatin] Other (See Comments)   Cramps       Medication List    TAKE these medications   AIRBORNE PO Take 1 each by mouth daily.   amLODipine 10 MG tablet Commonly known as: NORVASC Take 1 tablet (10 mg total) by mouth daily.   aspirin EC 81 MG tablet Take 81 mg by mouth daily.   chlorthalidone 25 MG tablet Commonly known as: HYGROTON TAKE 1/2 TABLET EVERY DAY   Evolocumab 140 MG/ML Soaj Commonly known as: Repatha SureClick Inject 109 mg into the skin every 14 (fourteen) days.   Fish Oil 1000 MG Caps Take 1 capsule by mouth daily.   GLUCOSAMINE PO Take 1 tablet by mouth at bedtime.   Klor-Con M20  20 MEQ tablet Generic drug: potassium chloride SA Take 2 tablets (40 mEq total) by mouth daily. What changed: how much to take   lisinopril 40 MG tablet Commonly known as: ZESTRIL Take 1 tablet (40 mg total) by mouth daily.   pantoprazole 40 MG tablet Commonly known as: PROTONIX Take 1 tablet (40 mg total) by mouth daily. Start taking on: January 31, 2019   TYLENOL 500 MG tablet Generic drug: acetaminophen Take 500 mg by mouth 2 (two) times daily.      Allergies  Allergen Reactions  . Crestor [Rosuvastatin] Other (See Comments)    Cramps    Follow-up Information    Sharilyn Sites, MD Follow up on 02/03/2019.   Specialty: Family Medicine Why: Please follow up with Dr. Gerarda Fraction on Friday, July 17th at 11:15am. Contact information: [address] [place] [address] [phone]           The results of significant diagnostics from this hospitalization (including imaging, microbiology, ancillary and laboratory) are listed below for reference.    Significant Diagnostic Studies: Dg Cervical Spine Complete  Result Date: 01/29/2019 CLINICAL DATA:  Pt states he has had centralized cp and neck pain that radiates down his right arm onset since Friday. Pt denies any sob or injury to neck. Pt has hx of diabetes and hypertension. Pt denies any on body medical injector devices. EXAM: CERVICAL SPINE - COMPLETE 4+ VIEW COMPARISON:  Cervical spine radiographs, 05/04/2013 FINDINGS: No fracture, bone lesion or spondylolisthesis. Status post posterior cervical spine fusion, C3 through C7. The pedicle screws and interconnecting rods are well positioned and stable from the prior  exam. The discs are relatively well maintained in height. There are anterior endplate spurs most evident at C5-C6 and C6-C7. Soft tissues are unremarkable other than left carotid artery calcifications. IMPRESSION: 1. No fracture, bone lesion or spondylolisthesis. 2. Well-positioned orthopedic hardware from the previous  posterior fusion, C3 through C7. No evidence of loosening of the orthopedic hardware. Electronically Signed   By: Lajean Manes M.D.   On: 01/29/2019 06:34   Nm Myocar Multi W/spect W/wall Motion / Ef  Result Date: 01/30/2019  There was no ST segment deviation noted during stress.  This is a low risk study.  Nuclear stress EF: 47%.  Defect 1: There is a medium defect of mild severity present in the basal inferior and mid inferior location. This is likely due to soft tissue attenuation.  No large ischemic territories.     Microbiology: Recent Results (from the past 240 hour(s))  SARS Coronavirus 2 (CEPHEID - Performed in [place] hospital lab), Hosp Order     Status: None   Collection Time: 01/29/19  7:37 AM   Specimen: Nasopharyngeal Swab  Result Value Ref Range Status   SARS Coronavirus 2 NEGATIVE NEGATIVE Final    Comment: (NOTE) If result is NEGATIVE SARS-CoV-2 target nucleic acids are NOT DETECTED. The SARS-CoV-2 RNA is generally detectable in upper and lower  respiratory specimens during the acute phase of infection. The lowest  concentration of SARS-CoV-2 viral copies this assay can detect is 250  copies / mL. A negative result does not preclude SARS-CoV-2 infection  and should not be used as the sole basis for treatment or other  patient management decisions.  A negative result may occur with  improper specimen collection / handling, submission of specimen other  than nasopharyngeal swab, presence of viral mutation(s) within the  areas targeted by this assay, and inadequate number of viral copies  (<250 copies / mL). A negative result must be combined with clinical  observations, patient history, and epidemiological information. If result is POSITIVE SARS-CoV-2 target nucleic acids are DETECTED. The SARS-CoV-2 RNA is generally detectable in upper and lower  respiratory specimens dur ing the acute phase of infection.  Positive  results are indicative of active infection  with SARS-CoV-2.  Clinical  correlation with patient history and other diagnostic information is  necessary to determine patient infection status.  Positive results do  not rule out bacterial infection or co-infection with other viruses. If result is PRESUMPTIVE POSTIVE SARS-CoV-2 nucleic acids MAY BE PRESENT.   A presumptive positive result was obtained on the submitted specimen  and confirmed on repeat testing.  While 2019 novel coronavirus  (SARS-CoV-2) nucleic acids may be present in the submitted sample  additional confirmatory testing may be necessary for epidemiological  and / or clinical management purposes  to differentiate between  SARS-CoV-2 and other Sarbecovirus currently known to infect humans.  If clinically indicated additional testing with an alternate test  methodology [phone]) is advised. The SARS-CoV-2 RNA is generally  detectable in upper and lower respiratory sp ecimens during the acute  phase of infection. The expected result is Negative. Fact Sheet for Patients:  StrictlyIdeas.no Fact Sheet for Healthcare Providers: BankingDealers.co.za This test is not yet approved or cleared by the [place] FDA and has been authorized for detection and/or diagnosis of SARS-CoV-2 by FDA under an Emergency Use Authorization (EUA).  This EUA will remain in effect (meaning this test can be used) for the duration of the COVID-19 declaration under Section 564(b)(1) of the Act,  21 U.S.C. section 360bbb-3(b)(1), unless the authorization is terminated or revoked sooner. Performed at [facility], [address] [place], [place] [address]      Labs: Basic Metabolic Panel: Recent Labs  Lab 01/29/19 0427 01/30/19 0524  NA 141 143  K 2.8* 3.2*  CL 108 107  CO2 24 26  GLUCOSE 123* 114*  BUN 14 10  CREATININE 0.94 1.00  CALCIUM 8.2* 8.3*  MG  --  2.1   CBC: Recent Labs  Lab 01/29/19 0427  WBC 7.6  HGB 13.3  HCT 40.1   MCV 91.3  PLT 275   CBG: Recent Labs  Lab 01/29/19 1611 01/29/19 2102 01/30/19 0733 01/30/19 1316 01/30/19 1600  GLUCAP 83 132* 118* 134* 124*     Signed:  Barton Dubois MD.  [facility] 01/30/2019, 4:51 PM

## 2019-02-03 DIAGNOSIS — R079 Chest pain, unspecified: Secondary | ICD-10-CM | POA: Diagnosis not present

## 2019-02-03 DIAGNOSIS — I700 Atherosclerosis of aorta: Secondary | ICD-10-CM | POA: Diagnosis not present

## 2019-02-03 DIAGNOSIS — E119 Type 2 diabetes mellitus without complications: Secondary | ICD-10-CM | POA: Diagnosis not present

## 2019-02-03 DIAGNOSIS — Z6831 Body mass index (BMI) 31.0-31.9, adult: Secondary | ICD-10-CM | POA: Diagnosis not present

## 2019-02-03 DIAGNOSIS — R0789 Other chest pain: Secondary | ICD-10-CM | POA: Diagnosis not present

## 2019-02-03 DIAGNOSIS — K219 Gastro-esophageal reflux disease without esophagitis: Secondary | ICD-10-CM | POA: Diagnosis not present

## 2019-02-03 DIAGNOSIS — I10 Essential (primary) hypertension: Secondary | ICD-10-CM | POA: Diagnosis not present

## 2019-02-27 ENCOUNTER — Ambulatory Visit (INDEPENDENT_AMBULATORY_CARE_PROVIDER_SITE_OTHER): Payer: Medicare HMO | Admitting: Family Medicine

## 2019-02-27 ENCOUNTER — Other Ambulatory Visit: Payer: Self-pay

## 2019-02-27 ENCOUNTER — Encounter: Payer: Self-pay | Admitting: Family Medicine

## 2019-02-27 VITALS — BP 158/88 | HR 68 | Temp 98.7°F | Resp 12 | Ht 66.0 in | Wt 203.0 lb

## 2019-02-27 DIAGNOSIS — Z7689 Persons encountering health services in other specified circumstances: Secondary | ICD-10-CM | POA: Diagnosis not present

## 2019-02-27 DIAGNOSIS — K219 Gastro-esophageal reflux disease without esophagitis: Secondary | ICD-10-CM | POA: Diagnosis not present

## 2019-02-27 DIAGNOSIS — R011 Cardiac murmur, unspecified: Secondary | ICD-10-CM | POA: Diagnosis not present

## 2019-02-27 DIAGNOSIS — Z6832 Body mass index (BMI) 32.0-32.9, adult: Secondary | ICD-10-CM

## 2019-02-27 DIAGNOSIS — E6609 Other obesity due to excess calories: Secondary | ICD-10-CM

## 2019-02-27 DIAGNOSIS — E1169 Type 2 diabetes mellitus with other specified complication: Secondary | ICD-10-CM | POA: Diagnosis not present

## 2019-02-27 DIAGNOSIS — E785 Hyperlipidemia, unspecified: Secondary | ICD-10-CM | POA: Diagnosis not present

## 2019-02-27 DIAGNOSIS — I10 Essential (primary) hypertension: Secondary | ICD-10-CM | POA: Diagnosis not present

## 2019-02-27 DIAGNOSIS — R630 Anorexia: Secondary | ICD-10-CM

## 2019-02-27 NOTE — Progress Notes (Signed)
Subjective:     [name], male   DOB: [birth year], 78 y.o.   MRN: [id number]  Douglas Keller presents for New Patient (Initial Visit) (establish care)  Douglas Keller is here to establish care he is a 78 year old male patient who has a history of aortic valve sclerosis, CAD, type 2 diabetes that is diet-controlled, hypertension, hyperlipidemia among others.  Has had a cervical spine surgery, and is up-to-date on his colonoscopy, has had a knee arthroscopic as well.  Denies ever using tobacco.  Denies drinking.  Denies drug use.  Denies currently being sexually active.  Reports that he lives alone right now, wife passed away in 10-13-2007 from pancreatic cancer.   Has a daughter that lives in [place] with 2 grandchildren.  He enjoys playing golf.  He reports he just eats what he likes.  He does have a sweet tooth.  He does eat some fast food.  Does not eat a lot of canned or processed boxed food.  Reports he does not get enough fresh fruits or veggies in his diet.  Reports he only drinks 1 cup of coffee daily.  Reports he does drink 3-4 bottles of water daily and Gatorade every now and then  Health maintenance: Secondary to being in the prediabetic range she needs to get a foot exam, and eye exam.  Needs to have a tetanus update.  Needs to have a pneumonia vaccine.  Additionally needs to have flu vaccine coming up has not had prostate checked.  Overall today in the office he feels okay.  Only complaints are he is not really as hungry as he used to be.  Only eats when he thinks about it.  He does not get hungry.  Additionally he is not sleeping very well.  He reports he naps occasionally.  He relates that he just cannot get comfortable and tosses and turns frequently.  Today patient denies signs and symptoms of COVID 19 infection including fever, chills, cough, shortness of breath, and headache.  Past Medical, Surgical, Social History, Allergies, and Medications have been  Reviewed.   Past Medical History:  Diagnosis Date   Aortic valve sclerosis    Atherosclerosis of coronary artery without angina pectoris 07/05/2014   Diabetes mellitus type 2, diet-controlled (Douglas Keller)    HTN (hypertension) 07/06/2013   Hyperlipidemia 07/06/2013   Past Surgical History:  Procedure Laterality Date   CERVICAL SPINE SURGERY     COLONOSCOPY  07/26/2012   Procedure: COLONOSCOPY;  Surgeon: Jamesetta So, MD;  Location: [facility];  Service: Gastroenterology;  Laterality: N/A;   KNEE ARTHROSCOPY     Social History   Socioeconomic History   Marital status: Widowed    Spouse name: Not on file   Number of children: Not on file   Years of education: Not on file   Highest education level: Not on file  Occupational History   Not on file  Social Needs   Financial resource strain: Not on file   Food insecurity    Worry: Not on file    Inability: Not on file   Transportation needs    Medical: Not on file    Non-medical: Not on file  Tobacco Use   Smoking status: Never Smoker   Smokeless tobacco: Never Used  Substance and Sexual Activity   Alcohol use: No   Drug use: No   Sexual activity: Not on file  Lifestyle   Physical activity    Days per week: Not on  file    Minutes per session: Not on file   Stress: Not on file  Relationships   Social connections    Talks on phone: Not on file    Gets together: Not on file    Attends religious service: Not on file    Active member of club or organization: Not on file    Attends meetings of clubs or organizations: Not on file    Relationship status: Not on file   Intimate partner violence    Fear of current or ex partner: Not on file    Emotionally abused: Not on file    Physically abused: Not on file    Forced sexual activity: Not on file  Other Topics Concern   Not on file  Social History Narrative   Not on file    Outpatient Encounter Medications as of 02/27/2019  Medication Sig    acetaminophen (TYLENOL) 650 MG CR tablet Take 1,300 mg by mouth 2 (two) times daily.   amLODipine (NORVASC) 10 MG tablet Take 1 tablet (10 mg total) by mouth daily.   aspirin EC 81 MG tablet Take 81 mg by mouth daily.   Evolocumab (REPATHA SURECLICK) 749 MG/ML SOAJ Inject 140 mg into the skin every 14 (fourteen) days.   GLUCOSAMINE PO Take 1 tablet by mouth at bedtime.   KLOR-CON M20 20 MEQ tablet Take 2 tablets (40 mEq total) by mouth daily.   lisinopril (PRINIVIL,ZESTRIL) 40 MG tablet Take 1 tablet (40 mg total) by mouth daily.   Multiple Vitamins-Minerals (AIRBORNE PO) Take 1 each by mouth daily.   Omega-3 Fatty Acids (FISH OIL) 1000 MG CAPS Take 1 capsule by mouth daily.   pantoprazole (PROTONIX) 40 MG tablet Take 1 tablet (40 mg total) by mouth daily.   [DISCONTINUED] acetaminophen (TYLENOL) 500 MG tablet Take 500 mg by mouth 2 (two) times daily.   [DISCONTINUED] chlorthalidone (HYGROTON) 25 MG tablet TAKE 1/2 TABLET EVERY DAY (Patient not taking: Reported on 02/27/2019)   No facility-administered encounter medications on file as of 02/27/2019.    Allergies  Allergen Reactions   Crestor [Rosuvastatin] Other (See Comments)    Cramps     Review of Systems  Constitutional: Negative for chills and fever.  HENT: Negative.   Eyes: Negative.   Respiratory: Negative for cough and shortness of breath.   Cardiovascular: Negative for chest pain, palpitations and leg swelling.  Gastrointestinal: Negative.   Endocrine: Negative.   Genitourinary: Negative.   Musculoskeletal: Positive for back pain.  Skin: Negative.   Allergic/Immunologic: Negative.   Neurological: Negative for dizziness and headaches.  Hematological: Negative.   Psychiatric/Behavioral: Positive for sleep disturbance.  All other systems reviewed and are negative.      Objective:     BP (!) 160/86    Pulse 68    Temp 98.7 F (37.1 C) (Oral)    Resp 12    Ht 5\' 6"  (1.676 m)    Wt 203 lb (92.1 kg)    SpO2 99%     BMI 32.77 kg/m   Physical Exam Vitals signs and nursing note reviewed.  Constitutional:      Appearance: Normal appearance. He is well-developed and well-groomed. He is obese.  HENT:     Head: Normocephalic and atraumatic.     Right Ear: External ear normal.     Left Ear: External ear normal.     Nose: Nose normal.     Mouth/Throat:     Mouth: Mucous membranes are moist.  Pharynx: Oropharynx is clear.  Eyes:     General:        Right eye: No discharge.        Left eye: No discharge.     Conjunctiva/sclera: Conjunctivae normal.  Neck:     Musculoskeletal: Normal range of motion and neck supple.  Cardiovascular:     Rate and Rhythm: Normal rate and regular rhythm.     Pulses: Normal pulses.     Heart sounds: Murmur present.  Pulmonary:     Effort: Pulmonary effort is normal.     Breath sounds: Normal breath sounds.  Musculoskeletal: Normal range of motion.  Skin:    General: Skin is warm.  Neurological:     General: No focal deficit present.     Mental Status: He is alert and oriented to person, place, and time.  Psychiatric:        Attention and Perception: Attention normal.        Mood and Affect: Mood normal.        Speech: Speech normal.        Behavior: Behavior normal. Behavior is cooperative.        Thought Content: Thought content normal.        Cognition and Memory: Cognition normal.        Judgment: Judgment normal.        Assessment and Plan        1. Class 1 obesity due to excess calories with serious comorbidity and body mass index (BMI) of 32.0 to 32.9 in adult  Douglas Keller is re-educated about the importance of exercise daily to help with weight management. A minumum of 30 minutes daily is recommended. Additionally, importance of healthy food choices  with portion control discussed. Encouraged to start a food diary, count calories and to consider joining a  support group if possible. Diet information sheets offered.  Filed Weights    02/27/19 1314  Weight: 203 lb (92.1 kg)    2. Essential hypertension Douglas Keller is encouraged to maintain a well balanced diet that is low in salt. Controlled, continue current medication regimen.  No refills needed.  He is also reminded that exercise is beneficial for heart health and control of  Blood pressure. 30-60 minutes daily is recommended-walking was suggested.  Will look to cardiology for adjustments for medications  I appreciate collaboration in patient's plan of care. Please let PCP know if assistance from [place] is needed.   3. Gastroesophageal reflux disease without esophagitis Controlled, continue medication  4. Murmur, cardiac Noted murmur. Followed by cards. Does not appear to have side effects of this.   5. Hyperlipidemia, unspecified hyperlipidemia type Is on Repatha due to allergy to statins  6. Decreased appetite Reports decreased in desire to eat.  Encouraged to get ensure is this continues   7. Type 2 diabetes mellitus with hyperlipidemia ([place]) July A1c was 6.5%  Konstantin Lehnen is encouraged to check blood sugar daily as directed. Continue current medications. Educated on importance of maintain a well balanced diabetic friendly diet.  He is reminded the importance of maintaining  good blood sugars,  taking medications as directed, daily foot care, annual eye exams. Additionally educated about keeping good control over blood pressure and cholesterol as well.   8. Encounter to establish care As documented above   Follow Up: 3 weeks for sleep   Perlie Mayo, DNP, AGNP-BC [address], [place], [place] [address]  Office Hours: Mon-Thurs 8 am-5 pm; Fri 8 am-12 pm Office Phone:  [phone]  Office Fax: [phone]

## 2019-02-27 NOTE — Patient Instructions (Signed)
Thank you for coming into the office today. I appreciate the opportunity to provide you with the care for your health and wellness. Today we discussed: establish care  Follow Up: 2-3 weeks for sleep   No labs today  Continue drinking water well.  Take all medications as directed.  You can stop taking the fish oil and glucosamine.   I have attached a sleep info sheet you can read and see if you can get on a routine for bed.  Work to [employer] and fruits  Please continue to practice social distancing to keep you, your family, and our community safe.  If you must go out, please wear a Mask and practice good handwashing.  [place] YOUR HANDS WELL AND FREQUENTLY. AVOID TOUCHING YOUR FACE, UNLESS YOUR HANDS ARE FRESHLY WASHED.  GET FRESH AIR DAILY. STAY HYDRATED WITH WATER.   It was a pleasure to see you and I look forward to continuing to work together on your health and well-being. Please do not hesitate to call the office if you need care or have questions about your care.  Have a wonderful day and week. With Gratitude, Cherly Beach, DNP, AGNP-BC   Teens need about 9 hours of sleep a night. Younger children need more sleep (10-11 hours a night) and adults need slightly less (7-9 hours each night).  11 Tips to Follow: 1. No caffeine after 3pm: Avoid beverages with caffeine (soda, tea, energy drinks, etc.) especially after 3pm.  2. Don't go to bed hungry: Have your evening meal at least 3 hrs. before going to sleep. It's fine to have a small bedtime snack such as a glass of milk and a few crackers but don't have a big meal.  3. Have a nightly routine before bed: Plan on "winding down" before you go to sleep. Begin relaxing about 1 hour before you go to bed. Try doing a quiet activity such as listening to calming music, reading a book or meditating.  4. Turn off the TV and ALL electronics including video games, tablets, laptops, etc. 1 hour before sleep, and keep them  out of the bedroom.  5. Turn off your cell phone and all notifications (new email and text alerts) or even better, leave your phone outside your room while you sleep. Studies have shown that a part of your brain continues to respond to certain lights and sounds even while you're still asleep.  6. Make your bedroom quiet, dark and cool. If you can't control the noise, try wearing earplugs or using a fan to block out other sounds.  7. Practice relaxation techniques. Try reading a book or meditating or drain your brain by writing a list of what you need to do the next day.  8. Don't nap unless you feel sick: you'll have a better night's sleep.  9. Don't smoke, or quit if you do. Nicotine, alcohol, and marijuana can all keep you awake. Talk to your health care provider if you need help with substance use.  10. Most importantly, wake up at the same time every day (or within 1 hour of your usual wake up time) EVEN on the weekends. A regular wake up time promotes sleep hygiene and prevents sleep problems.  11. Reduce exposure to bright light in the last three hours of the day before going to sleep.  Maintaining good sleep hygiene and having good sleep habits lower your risk of developing sleep problems. Getting better sleep can also improve your concentration and alertness.  Try the simple steps in this guide. If you still have trouble getting enough rest, make an appointment with your health care provider.

## 2019-02-28 ENCOUNTER — Encounter: Payer: Self-pay | Admitting: Family Medicine

## 2019-02-28 ENCOUNTER — Telehealth: Payer: Self-pay | Admitting: *Deleted

## 2019-02-28 ENCOUNTER — Other Ambulatory Visit: Payer: Self-pay

## 2019-02-28 MED ORDER — KLOR-CON M20 20 MEQ PO TBCR: 40.0000 meq | EXTENDED_RELEASE_TABLET | Freq: Every day | ORAL | 1 refills | Status: DC

## 2019-02-28 NOTE — Telephone Encounter (Signed)
Med refilled.

## 2019-02-28 NOTE — Telephone Encounter (Signed)
Pt called needing a refill on his klorcon sent to [facility]. They changed his meds from 1 to 2 pills and his is running short

## 2019-03-13 DIAGNOSIS — H5210 Myopia, unspecified eye: Secondary | ICD-10-CM | POA: Diagnosis not present

## 2019-03-13 DIAGNOSIS — Z0100 Encounter for examination of eyes and vision without abnormal findings: Secondary | ICD-10-CM | POA: Diagnosis not present

## 2019-03-17 ENCOUNTER — Ambulatory Visit (INDEPENDENT_AMBULATORY_CARE_PROVIDER_SITE_OTHER): Payer: Medicare HMO | Admitting: Family Medicine

## 2019-03-17 ENCOUNTER — Other Ambulatory Visit: Payer: Self-pay

## 2019-03-17 ENCOUNTER — Encounter: Payer: Self-pay | Admitting: Family Medicine

## 2019-03-17 ENCOUNTER — Encounter (INDEPENDENT_AMBULATORY_CARE_PROVIDER_SITE_OTHER): Payer: Self-pay

## 2019-03-17 VITALS — BP 150/80 | HR 62 | Temp 98.6°F | Resp 12 | Ht 66.0 in | Wt 202.0 lb

## 2019-03-17 DIAGNOSIS — G4709 Other insomnia: Secondary | ICD-10-CM | POA: Diagnosis not present

## 2019-03-17 DIAGNOSIS — K219 Gastro-esophageal reflux disease without esophagitis: Secondary | ICD-10-CM

## 2019-03-17 DIAGNOSIS — I10 Essential (primary) hypertension: Secondary | ICD-10-CM

## 2019-03-17 DIAGNOSIS — Z125 Encounter for screening for malignant neoplasm of prostate: Secondary | ICD-10-CM | POA: Diagnosis not present

## 2019-03-17 DIAGNOSIS — Z23 Encounter for immunization: Secondary | ICD-10-CM

## 2019-03-17 MED ORDER — PANTOPRAZOLE SODIUM 40 MG PO TBEC: 40.0000 mg | DELAYED_RELEASE_TABLET | Freq: Every day | ORAL | 0 refills | Status: DC

## 2019-03-17 MED ORDER — VALSARTAN 40 MG PO TABS: 40.0000 mg | ORAL_TABLET | Freq: Every day | ORAL | 1 refills | Status: DC

## 2019-03-17 NOTE — Patient Instructions (Addendum)
    Thank you for coming into the office today. I appreciate the opportunity to provide you with the care for your health and wellness. Today we discussed: blood pressure and sleep  Follow Up: 4 weeks in the office for BP   Labs same day as appt-[facility] is on same floor as our office, at the end of the hall.  STOP Zestril/Lisinopril when you get your NEW medication (START) Valsartan/Diovan from [facility].  Please continue to practice social distancing to keep you, your family, and our community safe.  If you must go out, please wear a Mask and practice good handwashing.  [place] YOUR HANDS WELL AND FREQUENTLY. AVOID TOUCHING YOUR FACE, UNLESS YOUR HANDS ARE FRESHLY WASHED.  GET FRESH AIR DAILY. STAY HYDRATED WITH WATER.   It was a pleasure to see you and I look forward to continuing to work together on your health and well-being. Please do not hesitate to call the office if you need care or have questions about your care.  Have a wonderful day and week.  With Gratitude,  Cherly Beach, DNP, AGNP-BC

## 2019-03-17 NOTE — Progress Notes (Signed)
Subjective:     [name], male   DOB: 18-Mar-1941, 78 y.o.   MRN: [id number]  Douglas Keller presents for Insomnia (2-3 week follow up. )  Overall sleeping better, he used the sleep worksheet provided at last visit to help establish a bedtime routine. Overall he is not having any issues with sleep anymore.  He does report that he did have an upset stomach last night so he has not really slept much in the last 24 hours but he thinks that is related to something that he ate.  He has been working to get more fruits and vegetables in his diet as well.  Needs to work on walking.  Blood pressure is still elevated today reports taking his medications as directed.  Possibly need to have a change from an ACE to an arm could try this.  Getting flu shot today.  Need to have update of PSA, and CBC and CMAC given the change in medication.  Today patient denies signs and symptoms of COVID 19 infection including fever, chills, cough, shortness of breath, and headache.  Past Medical, Surgical, Social History, Allergies, and Medications have been Reviewed.  Past Medical History:  Diagnosis Date  . Aortic valve sclerosis   . Atherosclerosis of coronary artery without angina pectoris 07/05/2014  . Diabetes mellitus type 2, diet-controlled ([place])   . HTN (hypertension) 07/06/2013  . Hyperlipidemia 07/06/2013   Past Surgical History:  Procedure Laterality Date  . CERVICAL SPINE SURGERY    . COLONOSCOPY  07/26/2012   Procedure: COLONOSCOPY;  Surgeon: Jamesetta So, MD;  Location: [facility];  Service: Gastroenterology;  Laterality: N/A;  . KNEE ARTHROSCOPY     Social History   Socioeconomic History  . Marital status: Widowed    Spouse name: Cherly   . Number of children: 1  . Years of education: Not on file  . Highest education level: 12th grade  Occupational History    Comment: Retired   Scientific laboratory technician  . Financial resource strain: Not hard at all  . Food insecurity    Worry:  Never true    Inability: Never true  . Transportation needs    Medical: No    Non-medical: No  Tobacco Use  . Smoking status: Never Smoker  . Smokeless tobacco: Never Used  Substance and Sexual Activity  . Alcohol use: No  . Drug use: No  . Sexual activity: Not Currently  Lifestyle  . Physical activity    Days per week: 5 days    Minutes per session: 20 min  . Stress: Rather much  Relationships  . Social connections    Talks on phone: More than three times a week    Gets together: More than three times a week    Attends religious service: More than 4 times per year    Active member of club or organization: No    Attends meetings of clubs or organizations: Never    Relationship status: Widowed  . Intimate partner violence    Fear of current or ex partner: No    Emotionally abused: No    Physically abused: No    Forced sexual activity: No  Other Topics Concern  . Not on file  Social History Narrative   Live alone right now, wife passed in 2009    [place] [address] live in [place]      Enjoys playing golf      Diet: eats what he likes, sweet,  fast food, does not eat a lot of canned or boxed foods.    Does not eat enough fruits and veggies.       Caffeine: 1 cup daily   Water: 3-4 bottles of water daily, Gatorade every now and then         Wears seat belt   Wears sun protection when golfing   Smoke detectors at home   Does not use phone while driving    Outpatient Encounter Medications as of 03/17/2019  Medication Sig  . acetaminophen (TYLENOL) 650 MG CR tablet Take 1,300 mg by mouth 2 (two) times daily.  Marland Kitchen amLODipine (NORVASC) 10 MG tablet Take 1 tablet (10 mg total) by mouth daily.  Marland Kitchen aspirin EC 81 MG tablet Take 81 mg by mouth daily.  . Evolocumab (REPATHA SURECLICK) XX123456 MG/ML SOAJ Inject 140 mg into the skin every 14 (fourteen) days.  Marland Kitchen GLUCOSAMINE PO Take 1 tablet by mouth at bedtime.  Marland Kitchen KLOR-CON M20 20 MEQ tablet Take 2 tablets (40 mEq total) by  mouth daily.  . Multiple Vitamins-Minerals (AIRBORNE PO) Take 1 each by mouth daily.  . Omega-3 Fatty Acids (FISH OIL) 1000 MG CAPS Take 1 capsule by mouth daily.  . pantoprazole (PROTONIX) 40 MG tablet Take 1 tablet (40 mg total) by mouth daily.  . [DISCONTINUED] lisinopril (PRINIVIL,ZESTRIL) 40 MG tablet Take 1 tablet (40 mg total) by mouth daily.  . [DISCONTINUED] pantoprazole (PROTONIX) 40 MG tablet Take 1 tablet (40 mg total) by mouth daily.  . valsartan (DIOVAN) 40 MG tablet Take 1 tablet (40 mg total) by mouth daily.   No facility-administered encounter medications on file as of 03/17/2019.    Allergies  Allergen Reactions  . Crestor [Rosuvastatin] Other (See Comments)    Cramps     Review of Systems  Constitutional: Negative for chills and fever.  HENT: Negative.   Eyes: Negative.   Respiratory: Negative.  Negative for cough and shortness of breath.   Cardiovascular: Negative.   Gastrointestinal: Negative.   Endocrine: Negative.   Genitourinary: Negative.   Musculoskeletal: Negative.   Skin: Negative.   Allergic/Immunologic: Negative.   Neurological: Negative.   Hematological: Negative.   Psychiatric/Behavioral: Negative.   All other systems reviewed and are negative.      Objective:     BP (!) 150/80   Pulse 62   Temp 98.6 F (37 C) (Temporal)   Resp 12   Ht 5\' 6"  (1.676 m)   Wt 202 lb 0.6 oz (91.6 kg)   SpO2 96%   BMI 32.61 kg/m   Physical Exam Vitals signs and nursing note reviewed.  Constitutional:      Appearance: Normal appearance. He is well-developed and well-groomed. He is obese.  HENT:     Head: Normocephalic and atraumatic.     Right Ear: External ear normal.     Left Ear: External ear normal.     Nose: Nose normal.     Mouth/Throat:     Mouth: Mucous membranes are moist.     Pharynx: Oropharynx is clear.  Eyes:     General:        Right eye: No discharge.        Left eye: No discharge.     Conjunctiva/sclera: Conjunctivae normal.   Neck:     Musculoskeletal: Normal range of motion and neck supple.  Cardiovascular:     Rate and Rhythm: Normal rate and regular rhythm.     Pulses: Normal pulses.  Heart sounds: Normal heart sounds.  Pulmonary:     Effort: Pulmonary effort is normal.     Breath sounds: Normal breath sounds.  Musculoskeletal: Normal range of motion.  Skin:    General: Skin is warm.  Neurological:     General: No focal deficit present.     Mental Status: He is alert and oriented to person, place, and time.  Psychiatric:        Attention and Perception: Attention normal.        Mood and Affect: Mood normal.        Speech: Speech normal.        Behavior: Behavior normal. Behavior is cooperative.        Thought Content: Thought content normal.        Cognition and Memory: Cognition normal.        Judgment: Judgment normal.        Assessment and Plan        1. Essential hypertension Mchale Heinzmann is encouraged to maintain a well balanced diet that is low in salt. He is also reminded that exercise is beneficial for heart health and control of  Blood pressure. 30-60 minutes daily is recommended-walking was suggested.  His BP continues to run 150-160, would like to get better control of this. Going to switch from ACE to ARB. Recheck BP in office in 1 month   - valsartan (DIOVAN) 40 MG tablet; Take 1 tablet (40 mg total) by mouth daily.  Dispense: 30 tablet; Refill: 1 - COMPLETE METABOLIC PANEL WITH GFR - CBC  2. Gastroesophageal reflux disease without esophagitis Controlled, continue medication.  - pantoprazole (PROTONIX) 40 MG tablet; Take 1 tablet (40 mg total) by mouth daily.  Dispense: 90 tablet; Refill: 0 3. Other insomnia Improved, continue sleep habits implemented from last time   4. Encounter for screening for malignant neoplasm of prostate Need check, he is unsure when last check was - PSA   Follow Up: 04/14/2019  Perlie Mayo, DNP, AGNP-BC [place], [address] [place], [place] [address] Office Hours: Mon-Thurs 8 am-5 pm; Fri 8 am-12 pm Office Phone:  [phone]  Office Fax: [phone]

## 2019-03-20 DIAGNOSIS — Z23 Encounter for immunization: Secondary | ICD-10-CM | POA: Diagnosis not present

## 2019-03-20 DIAGNOSIS — Z125 Encounter for screening for malignant neoplasm of prostate: Secondary | ICD-10-CM | POA: Diagnosis not present

## 2019-03-20 DIAGNOSIS — G4709 Other insomnia: Secondary | ICD-10-CM | POA: Diagnosis not present

## 2019-03-20 DIAGNOSIS — K219 Gastro-esophageal reflux disease without esophagitis: Secondary | ICD-10-CM | POA: Diagnosis not present

## 2019-03-20 DIAGNOSIS — I10 Essential (primary) hypertension: Secondary | ICD-10-CM | POA: Diagnosis not present

## 2019-03-22 ENCOUNTER — Ambulatory Visit: Payer: Medicare HMO | Admitting: Family Medicine

## 2019-03-30 ENCOUNTER — Telehealth: Payer: Self-pay | Admitting: *Deleted

## 2019-03-30 DIAGNOSIS — I10 Essential (primary) hypertension: Secondary | ICD-10-CM

## 2019-03-30 DIAGNOSIS — K219 Gastro-esophageal reflux disease without esophagitis: Secondary | ICD-10-CM

## 2019-03-30 MED ORDER — VALSARTAN 40 MG PO TABS: 40.0000 mg | ORAL_TABLET | Freq: Every day | ORAL | 0 refills | Status: DC

## 2019-03-30 MED ORDER — PANTOPRAZOLE SODIUM 40 MG PO TBEC: 40.0000 mg | DELAYED_RELEASE_TABLET | Freq: Every day | ORAL | 0 refills | Status: DC

## 2019-03-30 NOTE — Telephone Encounter (Signed)
Pt called said the valsartan and the pantaprozole that Jarrett Soho prescribed is tied up somewhere in the mail order system. He needs a 2 week supply sent to [facility] in [place] of both to hold him over until his medications arrive

## 2019-03-30 NOTE — Telephone Encounter (Signed)
2 week supply sent

## 2019-04-13 ENCOUNTER — Other Ambulatory Visit: Payer: Self-pay

## 2019-04-13 ENCOUNTER — Encounter: Payer: Self-pay | Admitting: Family Medicine

## 2019-04-13 ENCOUNTER — Ambulatory Visit (INDEPENDENT_AMBULATORY_CARE_PROVIDER_SITE_OTHER): Payer: Medicare HMO | Admitting: Family Medicine

## 2019-04-13 VITALS — BP 150/86 | HR 80 | Temp 99.0°F | Resp 12 | Ht 66.0 in | Wt 206.1 lb

## 2019-04-13 DIAGNOSIS — G8929 Other chronic pain: Secondary | ICD-10-CM | POA: Diagnosis not present

## 2019-04-13 DIAGNOSIS — M5441 Lumbago with sciatica, right side: Secondary | ICD-10-CM

## 2019-04-13 DIAGNOSIS — I10 Essential (primary) hypertension: Secondary | ICD-10-CM | POA: Diagnosis not present

## 2019-04-13 DIAGNOSIS — M5442 Lumbago with sciatica, left side: Secondary | ICD-10-CM

## 2019-04-13 DIAGNOSIS — Z125 Encounter for screening for malignant neoplasm of prostate: Secondary | ICD-10-CM | POA: Diagnosis not present

## 2019-04-13 DIAGNOSIS — M4802 Spinal stenosis, cervical region: Secondary | ICD-10-CM

## 2019-04-13 LAB — COMPLETE METABOLIC PANEL WITH GFR
AG Ratio: 1.6 (calc) (ref 1.0–2.5)
ALT: 30 U/L (ref 9–46)
AST: 33 U/L (ref 10–35)
Albumin: 4.2 g/dL (ref 3.6–5.1)
Alkaline phosphatase (APISO): 80 U/L (ref 35–144)
BUN: 10 mg/dL (ref 7–25)
CO2: 27 mmol/L (ref 20–32)
Calcium: 8.7 mg/dL (ref 8.6–10.3)
Chloride: 107 mmol/L (ref 98–110)
Creat: 1.08 mg/dL (ref 0.70–1.18)
GFR, Est African American: 76 mL/min/{1.73_m2} (ref >=60)
GFR, Est Non African American: 66 mL/min/{1.73_m2} (ref >=60)
Globulin: 2.7 g/dL (calc) (ref 1.9–3.7)
Glucose, Bld: 170 mg/dL — ABNORMAL HIGH (ref 65–139)
Potassium: 3.9 mmol/L (ref 3.5–5.3)
Sodium: 142 mmol/L (ref 135–146)
Total Bilirubin: 1 mg/dL (ref 0.2–1.2)
Total Protein: 6.9 g/dL (ref 6.1–8.1)

## 2019-04-13 LAB — CBC
HCT: 42.6 % (ref 38.5–50.0)
Hemoglobin: 14.4 g/dL (ref 13.2–17.1)
MCH: 30.3 pg (ref 27.0–33.0)
MCHC: 33.8 g/dL (ref 32.0–36.0)
MCV: 89.7 fL (ref 80.0–100.0)
MPV: 9.9 fL (ref 7.5–12.5)
Platelets: 318 10*3/uL (ref 140–400)
RBC: 4.75 10*6/uL (ref 4.20–5.80)
RDW: 12.6 % (ref 11.0–15.0)
WBC: 7.7 10*3/uL (ref 3.8–10.8)

## 2019-04-13 LAB — PSA: PSA: 2.8 ng/mL (ref <=4.0)

## 2019-04-13 MED ORDER — METHYLPREDNISOLONE ACETATE 80 MG/ML IJ SUSP
60.0000 mg | Freq: Once | INTRAMUSCULAR | Status: AC
  Administered 2019-04-13: 60 mg via INTRAMUSCULAR

## 2019-04-13 MED ORDER — PREDNISONE 10 MG (21) PO TBPK: ORAL_TABLET | ORAL | 0 refills | Status: DC

## 2019-04-13 MED ORDER — KETOROLAC TROMETHAMINE 60 MG/2ML IM SOLN
30.0000 mg | Freq: Once | INTRAMUSCULAR | Status: AC
  Administered 2019-04-13: 30 mg via INTRAMUSCULAR

## 2019-04-13 MED ORDER — VALSARTAN 80 MG PO TABS: 80.0000 mg | ORAL_TABLET | Freq: Every day | ORAL | 2 refills | Status: DC

## 2019-04-13 NOTE — Patient Instructions (Addendum)
    I appreciate the opportunity to provide you with the care for your health and wellness. Today we discussed: BP   And neck and back pain  Follow up: 1 month  No labs today  Referrals to both PT and Neurology.  Today you got 2 medication injections: Toradol for pain and Depo-Med for inflammation. Use a heating pad to help with tension/tight muscles in the neck area.  I have sent a prednisone dose pack to the pharmacy (Please start tomorrow) Take as directed on package  I have also increased your BP medication to 80 mg from 40 mg. You can double you current 40 mg until you get or need refill.   Please continue to practice social distancing to keep you, your family, and our community safe.  If you must go out, please wear a mask and practice good handwashing.  GET FRESH AIR DAILY. STAY HYDRATED WITH WATER.   It was a pleasure to see you and I look forward to continuing to work together on your health and well-being. Please do not hesitate to call the office if you need care or have questions about your care.  Have a wonderful day and week. With Gratitude, Cherly Beach, DNP, AGNP-BC

## 2019-04-13 NOTE — Progress Notes (Signed)
Subjective:     [name], male   DOB: [birth year], 78 y.o.   MRN: [id number]  Douglas Keller presents for Hypertension (bp check)  Here for follow-up of hypertension.  Mr. Douglas Keller is a 78 year old male patient who previously has been examined by me and is continued to have elevated blood pressure in the [id number] systolic range.  Does not take his blood pressure at home.  Reports that he tries to eat a decent diet.  Advised him at the last appointment that I was going to adjust his medications from an ACE to an arm.  So I started him on valsartan 40 mg and stopped his lisinopril.  He remained on Norvasc.  He reports taking his medications as directed.  And without issue.  He denies having any cardiac symptoms including chest pain, leg swelling, palpitations, dyspnea, fatigue, syncope, headaches, vision changes. Cardiovascular risk factors: advanced age (older than 56 for men, 57 for women), hypertension, male gender, obesity (BMI >= 30 kg/m2) and sedentary lifestyle. Use of agents associated with hypertension: none.   Additionally he reports that he is having trouble sleeping.  Reports that his pain today in the office with his neck and back is a 6 out of 10.  But at night it gets much worse up until an 8 out of 10.  And he is also having discomfort in the back of his legs.  He has had previous spinal surgeries.  Has had previous problems with his lumbar spine as well.  Has been seen by both neurosurgery and orthopedics.  Recent x-ray in July demonstrated status post posterior cervical fusion of C3-C7.  Screws and rods were in well-positioned and stable.  Disc were relatively maintained in height.  There was anterior endplate spurs at [id number] and C6-C7.  Tissues and carotid arteries were unremarkable.  There is no evidence of loosening of hardware.  He still reports that he is having neck pain and back pain and would like to see about getting some help with this.  Is willing to go back to  physical therapy and have referrals.  Back pain: Ongoing back pain he is dealt with for several years. This pain is located in the lower back with radiation into the buttock and posterior thigh.  Predominantly at night. Pain score today in office is rated 6/10. At its worse the pain is a level 8-9/10. It has started to disturb sleep and limiting movement. It is relieved by nothing.  But has tried taking Mobic. It is aggravated by appears to be aggravated by movement and laying flat.  There is no associated lower extremity numbness or weakness. There is no associated incontinence of stool or urine.   Today patient denies signs and symptoms of COVID 19 infection including fever, chills, cough, shortness of breath, and headache.  Past Medical, Surgical, Social History, Allergies, and Medications have been Reviewed.   Past Medical History:  Diagnosis Date   Aortic valve sclerosis    Atherosclerosis of coronary artery without angina pectoris 07/05/2014   Diabetes mellitus type 2, diet-controlled ([place])    HTN (hypertension) 07/06/2013   Hyperlipidemia 07/06/2013   Past Surgical History:  Procedure Laterality Date   CERVICAL SPINE SURGERY     COLONOSCOPY  07/26/2012   Procedure: COLONOSCOPY;  Surgeon: Jamesetta So, MD;  [facility];  Service: Gastroenterology;  Laterality: N/A;   KNEE ARTHROSCOPY     Social History   Socioeconomic History   Marital status: Widowed  Spouse name: Cherly    Number of children: 1   Years of education: Not on file   Highest education level: 12th grade  Occupational History    Comment: Retired   Scientist, product/process development strain: Not hard at [employer]    Worry: Never true    Inability: Never true   Transportation needs    Medical: No    Non-medical: No  Tobacco Use   Smoking status: Never Smoker   Smokeless tobacco: Never Used  Substance and Sexual Activity   Alcohol use: No   Drug use: No    Sexual activity: Not Currently  Lifestyle   Physical activity    Days per week: 5 days    Minutes per session: 20 min   Stress: Rather much  Relationships   Social connections    Talks on phone: More than three times a week    Gets together: More than three times a week    Attends religious service: More than 4 times per year    Active member of club or organization: No    Attends meetings of clubs or organizations: Never    Relationship status: Widowed   Intimate partner violence    Fear of current or ex partner: No    Emotionally abused: No    Physically abused: No    Forced sexual activity: No  Other Topics Concern   Not on file  Social History Narrative   Live alone right now, wife passed in 2009    [place] [address] live in [place]      Enjoys playing golf      Diet: eats what he likes, sweet, fast food, does not eat a lot of canned or boxed foods.    Does not eat enough fruits and veggies.       Caffeine: 1 cup daily   Water: 3-4 bottles of water daily, Gatorade every now and then         Wears seat belt   Wears sun protection when golfing   Smoke detectors at home   Does not use phone while driving    Outpatient Encounter Medications as of 04/13/2019  Medication Sig   acetaminophen (TYLENOL) 650 MG CR tablet Take 1,300 mg by mouth 2 (two) times daily.   amLODipine (NORVASC) 10 MG tablet Take 1 tablet (10 mg total) by mouth daily.   aspirin EC 81 MG tablet Take 81 mg by mouth daily.   Evolocumab (REPATHA SURECLICK) [id number] MG/ML SOAJ Inject 140 mg into the skin every 14 (fourteen) days.   GLUCOSAMINE PO Take 1 tablet by mouth at bedtime.   KLOR-CON M20 20 MEQ tablet Take 2 tablets (40 mEq total) by mouth daily.   Multiple Vitamins-Minerals (AIRBORNE PO) Take 1 each by mouth daily.   Omega-3 Fatty Acids (FISH OIL) 1000 MG CAPS Take 1 capsule by mouth daily.   pantoprazole (PROTONIX) 40 MG tablet Take 1 tablet (40 mg total) by mouth  daily.   valsartan (DIOVAN) 80 MG tablet Take 1 tablet (80 mg total) by mouth daily.   [DISCONTINUED] valsartan (DIOVAN) 40 MG tablet Take 1 tablet (40 mg total) by mouth daily.   predniSONE (STERAPRED UNI-PAK 21 TAB) 10 MG (21) TBPK tablet Take as directed on package   [EXPIRED] ketorolac (TORADOL) injection 30 mg    [EXPIRED] methylPREDNISolone acetate (DEPO-MEDROL) injection 60 mg    No facility-administered encounter medications on file as of 04/13/2019.  Allergies  Allergen Reactions   Crestor [Rosuvastatin] Other (See Comments)    Cramps     Review of Systems  Constitutional: Negative for chills and fever.  HENT: Negative.   Eyes: Negative.   Respiratory: Negative.   Cardiovascular: Negative.   Gastrointestinal: Negative.   Endocrine: Negative.   Genitourinary: Negative.   Musculoskeletal: Positive for arthralgias and neck pain.  Skin: Negative.   Allergic/Immunologic: Negative.   Neurological: Negative.   Hematological: Negative.   Psychiatric/Behavioral: Negative.   All other systems reviewed and are negative.      Objective:     BP (!) 150/86    Pulse 80    Temp 99 F (37.2 C) (Oral)    Resp 12    Ht 5\' 6"  (1.676 m)    Wt 206 lb 1.9 oz (93.5 kg)    SpO2 100%    BMI 33.27 kg/m   Physical Exam Vitals signs and nursing note reviewed.  Constitutional:      Appearance: Normal appearance. He is well-developed and well-groomed. He is obese.  HENT:     Head: Normocephalic and atraumatic.     Right Ear: External ear normal.     Left Ear: External ear normal.     Nose: Nose normal.     Mouth/Throat:     Mouth: Mucous membranes are moist.     Pharynx: Oropharynx is clear.  Eyes:     General:        Right eye: No discharge.        Left eye: No discharge.     Conjunctiva/sclera: Conjunctivae normal.  Neck:     Musculoskeletal: Decreased range of motion. Neck rigidity and muscular tenderness present.     Thyroid: No thyroid mass, thyromegaly or thyroid  tenderness.     Comments: Noted swelling of trapezius muscles, tenderness to touch Cardiovascular:     Rate and Rhythm: Normal rate and regular rhythm.     Pulses: Normal pulses.     Heart sounds: Normal heart sounds.  Pulmonary:     Effort: Pulmonary effort is normal.     Breath sounds: Normal breath sounds.  Musculoskeletal:     Lumbar back: He exhibits decreased range of motion.  Lymphadenopathy:     Cervical: No cervical adenopathy.  Skin:    General: Skin is warm.  Neurological:     General: No focal deficit present.     Mental Status: He is alert and oriented to person, place, and time.     Cranial Nerves: Cranial nerves are intact.     Sensory: Sensation is intact.     Motor: Motor function is intact. No tremor, atrophy, abnormal muscle tone, seizure activity or pronator drift.     Coordination: Coordination is intact. Romberg sign negative. Coordination normal. Finger-Nose-Finger Test normal. Rapid alternating movements normal.     Gait: Gait is intact.  Psychiatric:        Attention and Perception: Attention normal.        Mood and Affect: Mood normal.        Speech: Speech normal.        Behavior: Behavior normal. Behavior is cooperative.        Thought Content: Thought content normal.        Cognition and Memory: Cognition normal.        Judgment: Judgment normal.        Assessment and Plan       1. Essential hypertension Derold Ramirezgarcia is encouraged to  maintain a well balanced diet that is low in salt. Still not demonstrating control. Will increase medication today. Recheck in office in 4 weeks He is also reminded that exercise is beneficial for heart health and control of  Blood pressure. 30-60 minutes daily is recommended-walking was suggested.  - valsartan (DIOVAN) 80 MG tablet; Take 1 tablet (80 mg total) by mouth daily.  Dispense: 30 tablet; Refill: 2  2. Spinal stenosis in cervical region Significant neck and back pain. Reports not sleeping  well. Provided with Toradol and Depo-Medrol today in office.  Prescribed prednisone Dosepak to be started 24 hours from now.  Ordered physical therapy.  Possible referral to neurology might would benefit from starting gabapentin or something like.  Since he is starting to demonstrate signs of sciatica predominantly in the lower regions.  Would like to have additional work-up to make sure that that would be appropriate.  So referral to neurology was placed  - ketorolac (TORADOL) injection 30 mg - methylPREDNISolone acetate (DEPO-MEDROL) injection 60 mg - predniSONE (STERAPRED UNI-PAK 21 TAB) 10 MG (21) TBPK tablet; Take as directed on package  Dispense: 21 each; Refill: 0 - Ambulatory referral to Neurology - Ambulatory referral to Physical Therapy  3. Chronic low back pain with bilateral sciatica, unspecified back pain laterality Possible referral to neurology might would benefit from starting gabapentin or something like.  Since he is starting to demonstrate signs of sciatica predominantly in the lower regions.  Would like to have additional work-up to make sure that that would be appropriate.  So referral to neurology was placed  - ketorolac (TORADOL) injection 30 mg - methylPREDNISolone acetate (DEPO-MEDROL) injection 60 mg - predniSONE (STERAPRED UNI-PAK 21 TAB) 10 MG (21) TBPK tablet; Take as directed on package  Dispense: 21 each; Refill: 0 - Ambulatory referral to Neurology - Ambulatory referral to Physical Therapy    Follow-up: 4 weeks  Perlie Mayo, DNP, AGNP-BC [place], [address], [place] [address] Office Hours: Mon-Thurs 8 am-5 pm; Fri 8 am-12 pm Office Phone:  [phone]  Office Fax: [phone]

## 2019-04-14 ENCOUNTER — Ambulatory Visit: Payer: Medicare HMO | Admitting: Family Medicine

## 2019-04-14 NOTE — Progress Notes (Signed)
Please let Mr Meth know that his labs are back. PSA and blood levels are good and in range. Liver and Kidney are also good.  Stay hydrated with water. Please remind of next appt.

## 2019-04-26 ENCOUNTER — Telehealth (HOSPITAL_COMMUNITY): Payer: Self-pay | Admitting: Family Medicine

## 2019-04-26 ENCOUNTER — Encounter (HOSPITAL_COMMUNITY): Payer: Self-pay | Admitting: Physical Therapy

## 2019-04-26 ENCOUNTER — Other Ambulatory Visit: Payer: Self-pay

## 2019-04-26 ENCOUNTER — Ambulatory Visit (HOSPITAL_COMMUNITY): Payer: Medicare HMO | Attending: Family Medicine | Admitting: Physical Therapy

## 2019-04-26 DIAGNOSIS — M5441 Lumbago with sciatica, right side: Secondary | ICD-10-CM | POA: Diagnosis not present

## 2019-04-26 DIAGNOSIS — G8929 Other chronic pain: Secondary | ICD-10-CM | POA: Diagnosis not present

## 2019-04-26 DIAGNOSIS — M542 Cervicalgia: Secondary | ICD-10-CM | POA: Insufficient documentation

## 2019-04-26 DIAGNOSIS — M6281 Muscle weakness (generalized): Secondary | ICD-10-CM | POA: Insufficient documentation

## 2019-04-26 DIAGNOSIS — M5442 Lumbago with sciatica, left side: Secondary | ICD-10-CM | POA: Diagnosis not present

## 2019-04-26 NOTE — Therapy (Signed)
[place], [place], [address] Phone: [phone]   Fax:  [phone]  Physical Therapy Evaluation  Patient Details  Name: Douglas Keller MRN: [id number] Date of Birth: 12/25/1940 Referring Provider (PT): Cherly Beach, NP   Encounter Date: 04/26/2019  PT End of Session - 04/26/19 1039    Visit Number  1    Number of Visits  12    Date for PT Re-Evaluation  05/17/19    Authorization Type  [employer] HMO    Authorization Time Period  04/26/19 to 06/07/19, based on medical necessity    Authorization - Visit Number  1    Authorization - Number of Visits  10    PT Start Time  0915    PT Stop Time  1000    PT Time Calculation (min)  45 min    Activity Tolerance  Patient tolerated treatment well    Behavior During Therapy  [facility] for tasks assessed/performed       Past Medical History:  Diagnosis Date  . Aortic valve sclerosis   . Atherosclerosis of coronary artery without angina pectoris 07/05/2014  . Diabetes mellitus type 2, diet-controlled ([place])   . HTN (hypertension) 07/06/2013  . Hyperlipidemia 07/06/2013    Past Surgical History:  Procedure Laterality Date  . CERVICAL SPINE SURGERY    . COLONOSCOPY  07/26/2012   Procedure: COLONOSCOPY;  Surgeon: Jamesetta So, MD;  [facility];  Service: Gastroenterology;  Laterality: N/A;  . KNEE ARTHROSCOPY      There were no vitals filed for this visit.   Subjective Assessment - 04/26/19 0904    Subjective  Patient complains of chronic low back and neck pain that started years ago. Reports he has had a cervical fusion in October of 2014. States his neck felt better but after that surgery everything started hurting after that surgery (a couple years later). States his pain is worse in his back and legs (right worse than left). States that he gets lots of leg cramps in his right leg and they wake him up at night. Reports that he also has symptoms radiating  from his neck into his right shoulder. Reports that golf aggravates his symptoms and nothing really helps with his pain but mustard helps with his cramping. Reports he thinks the cramping is related to some of the drugs he is taking. States his neck pain comes and goes. States today is not that bad in either his back or neck. No bowel or bladder changes reported, no saddle pain/anesthesia. States he tries to stretch at home and pushes into pain with some of his exercises. Prior to [place] he was going to the gym every other day but hasn't been able to go lately.    Pertinent History  hx of lumbar fusion 2014 C3-7, hx of "fainting spells", HTN    Limitations  Lifting;Walking;House hold activities;Other (comment)   golf   Patient Stated Goals  to have less pain    Currently in Pain?  Yes    Pain Score  3     Pain Location  Back    Pain Orientation  Mid;Lower    Pain Descriptors / Indicators  Constant;Aching;Spasm;Shooting;Sharp    Pain Type  Chronic pain    Pain Radiating Towards  down both legs into toes, R>L    Pain Onset  More than a month ago    Pain Frequency  Constant    Aggravating Factors  golf, walking, bending    Pain Relieving Factors  rest    Multiple Pain Sites  Yes    Pain Score  3    Pain Location  Neck    Pain Orientation  Mid    Pain Descriptors / Indicators  Aching;Dull;Shooting    Pain Type  Chronic pain    Pain Radiating Towards  into right shoulder and arm    Pain Onset  More than a month ago    Pain Frequency  Constant    Aggravating Factors   neck motion and using arm         [facility] PT Assessment - 04/26/19 0001      Assessment   Medical Diagnosis  LBP with bilateral sciatica    Referring Provider (PT)  Cherly Beach, NP    Next MD Visit  05/02/19    Prior Therapy  yes for vestibular       Precautions   Precautions  None      Restrictions   Weight Bearing Restrictions  No      Balance Screen   Has the patient fallen in the past 6 months  No    Has the  patient had a decrease in activity level because of a fear of falling?   Yes    Is the patient reluctant to leave their home because of a fear of falling?   No      Home Environment   Living Environment  Private residence    Living Arrangements  Alone    Type of [place] Access  Stairs to enter    Entrance Stairs-Number of Steps  1    Home Layout  Two level    Alternate Level Stairs-Number of Steps  7      Prior Function   Level of Independence  Independent      Cognition   Overall Cognitive Status  Within Functional Limits for tasks assessed      Observation/Other Assessments   Focus on Therapeutic Outcomes (FOTO)   44% limitation       ROM / Strength   AROM / PROM / Strength  AROM;Strength      AROM   AROM Assessment Site  Lumbar;Hip;Knee    Right/Left Hip  Right;Left    Right Hip Flexion  105    Right Hip External Rotation   60    Right Hip Internal Rotation   30    Left Hip Flexion  115    Left Hip External Rotation   60    Left Hip Internal Rotation   30    Lumbar Flexion  50% limited - tightness in hamstrings    Lumbar Extension  50% limited - decreased pain in back and legs    Lumbar - Right Side Bend  50% limited felt good     Lumbar - Left Side Bend  50% limited - pulled on left pain    Lumbar - Right Rotation  50% limited - felt good    Lumbar - Left Rotation  50% limited - felt good      Strength   Strength Assessment Site  Lumbar;Hip;Knee;Ankle    Right/Left Hip  Right;Left    Right Hip Flexion  4/5   pain down leg   Right Hip Extension  4+/5    Right Hip ABduction  4/5    Left Hip Flexion  4+/5    Left Hip Extension  4/5  Left Hip ABduction  4+/5    Right/Left Knee  Right;Left    Right Knee Flexion  4/5   cramping in calf    Right Knee Extension  4+/5    Left Knee Flexion  4+/5    Left Knee Extension  4+/5    Right/Left Ankle  Right;Left    Right Ankle Dorsiflexion  5/5    Left Ankle Dorsiflexion  5/5      Flexibility   Soft  Tissue Assessment /Muscle Length  yes    Hamstrings  90/90 45 deg from full ext bilat but R painful      Palpation   Spinal mobility  hypomobility noted throughout thoracic and lumbar spine - no pain     Palpation comment  no tenderness noted in lumbar or gluteal musculature on this date      Special Tests   Other special tests  neg scour bilat, ely's test - knee flexion 90 degrees bilat                 Objective measurements completed on examination: See above findings.      [place] Adult PT Treatment/Exercise - 04/26/19 0001      Exercises   Exercises  Lumbar      Lumbar Exercises: Standing   Other Standing Lumbar Exercises  repeated lumbar extension - 2x15 - felt good      Lumbar Exercises: Seated   Other Seated Lumbar Exercises  nerve flossing - seated R leg extended - 2x15 gentle hip hinges, same position 2x15 DF/PF      Lumbar Exercises: Supine   Other Supine Lumbar Exercises  laying over towel roll in lumbar spine - 5 minutes             PT Education - 04/26/19 1110    Education Details  HEP, gradual/gentle movements vs pushing through pain symptoms    Person(s) Educated  Patient    Methods  Explanation;Demonstration;Handout    Comprehension  Verbalized understanding       PT Short Term Goals - 04/26/19 1050      PT SHORT TERM GOAL #1   Title  Patient will be independent in HEP to improve functional outcomes.    Time  3    Period  Weeks    Status  New    Target Date  05/17/19      PT SHORT TERM GOAL #2   Title  Patient will report at least 25% improvement in lumbar symptoms to improve QOL.    Time  3    Period  Weeks    Status  New    Target Date  05/17/19        PT Long Term Goals - 04/26/19 1051      PT LONG TERM GOAL #1   Title  Patient will be score with < 40% limitation on FOTO to improve functional mobility.    Baseline  44% limitation on FOTO    Time  6    Period  Weeks    Status  New    Target Date  06/07/19      PT LONG  TERM GOAL #2   Title  Patient will report at least 50% improvement in lumbar symptoms to improve QOL.    Time  6    Period  Weeks    Status  New    Target Date  06/07/19             Plan -  04/26/19 1041    Clinical Impression Statement  Patient presents with chronic lumber and cervical pain with radicular symptoms in his right arm and bilateral lower extremities. Today's session focused on assessment of lumbar symptoms as patient stated his lumbar spine was his priority at this time, but will assess cervical symptoms in subsequent visits. Patient tolerated session well with notable movement preference into lumbar extension. Patient also responded well with gentle nerve flossing techniques. Patient limited in mobility due to muscle/neural tone/tension and overall joint mobility. Patient would greatly benefit from skilled physical therapy focusing on improving muscle/neural length as well as spinal mobility to decrease symptoms and improve QOL.    Personal Factors and Comorbidities  Age;Comorbidity 1    Comorbidities  cervical fusion 2014, HTN, hx of fainting spells    Examination-Activity Limitations  Bend;Carry;Lift;Stand;Squat;Sleep    Examination-Participation Restrictions  Community Activity;Shop;Yard Work;Laundry;Cleaning    Stability/Clinical Decision Making  Stable/Uncomplicated    Clinical Decision Making  Low    Rehab Potential  Good    PT Frequency  2x / week    PT Duration  6 weeks    PT Treatment/Interventions  ADLs/Self Care Home Management;Aquatic Therapy;Cryotherapy;Electrical Stimulation;Iontophoresis 4mg /ml Dexamethasone;Moist Heat;Traction;Gait training;Stair training;Functional mobility training;Therapeutic activities;Therapeutic exercise;Neuromuscular re-education;Dry needling;Passive range of motion;Balance training;Patient/family education;Manual techniques    PT Next Visit Plan  HEP review, nerve flossing, lumbar extension lumbar mobility as tolerated by patient,  assesse cervical spine in future visits    PT Home Exercise Plan  eval: nerve flossing in seated hamstring position, seated ankle pumps (flossing), lumbar extension    Consulted and Agree with Plan of Care  Patient       Patient will benefit from skilled therapeutic intervention in order to improve the following deficits and impairments:  Decreased range of motion, Pain, Hypomobility, Decreased strength  Visit Diagnosis: Chronic bilateral low back pain with bilateral sciatica  Muscle weakness (generalized)  Cervicalgia     Problem List Patient Active Problem List   Diagnosis Date Noted  . Elevated troponin   . Gastroesophageal reflux disease   . Dyslipidemia   . Chest pain 01/29/2019  . Type 2 diabetes mellitus with hyperlipidemia ([place]) 09/01/2018  . Heterozygous familial hypercholesterolemia 09/01/2018  . Hypercholesterolemia 08/21/2015  . Medication management 08/21/2015  . Atherosclerosis of coronary artery without angina pectoris 07/05/2014  . Stiffness of left shoulder joint 07/19/2013  . Hyperlipidemia 07/06/2013  . HTN (hypertension) 07/06/2013  . Cervical spondylosis without myelopathy 02/13/2013  . Degeneration of cervical intervertebral disc 02/13/2013  . Spinal stenosis in cervical region 02/13/2013   11:11 AM, 04/26/19 Jerene Pitch, DPT Physical Therapy with [facility]  [phone] office  [place] [address] [place], [place], [address] Phone: [phone]   Fax:  [phone]  Name: Obama Garno MRN: [id number] Date of Birth: 22-Oct-1940

## 2019-04-26 NOTE — Patient Instructions (Signed)
Seated Hamstring Stretch reps: 15 sets: 2 daily: 2 weekly: 7   Exercise image step 1   Exercise image step 2  Setup  Begin sitting upright with your hands on your hips and one leg straight in front of you on the floor. Movement  Slowly bend your trunk forward until you feel a LITTLE stretch/LITTLE PAIN in the back of your thigh and hold for 1 breath and then return back to upright position, repeat  Tip  Make sure to keep your back straight during the exercise. Seated Ankle Pumps reps: 15 sets: 2 daily: 2 weekly: 7   Exercise image step 1   Exercise image step 2  Setup  Begin sitting upright with one leg straight forward in chair Movement  Slowly point your foot up toward your body, then pointing your toes away from your body, and repeat. Tip  Make sure to move your foot in a straight line and try to keep the rest of your leg relaxed. Disclaimer: This program provides exercises related to your condition that you can perform at home. As there is a risk of injury with any activity, use caution when performing exercises. If you experience any pain or discomfort, discontinue the exercises and contact your health care provider.  Login URL: [email or web address] . Access Code: [email or web address] . Date printed: 04/26/2019 Page 1  Standing Lumbar Extension reps: 10 sets: 2 daily: 1 weekly: 7   Exercise image step 1   Exercise image step 2  Setup  Begin in a standing upright position with your hands resting on your hips.  Movement  Slowly arch your trunk backwards and hold. Tip  Make sure to maintain your balance during the exercise and do not bend your knees.

## 2019-04-26 NOTE — Telephone Encounter (Signed)
I called and spoke to patient to cx appt because we wouldn't have the authorization back from [facility].... I just submitted this morning and we are supposed to wait 5 days before having patient come back in  04/26/19

## 2019-04-27 ENCOUNTER — Ambulatory Visit (HOSPITAL_COMMUNITY): Payer: Medicare HMO

## 2019-05-02 ENCOUNTER — Encounter: Payer: Self-pay | Admitting: Neurology

## 2019-05-02 ENCOUNTER — Ambulatory Visit: Payer: Medicare HMO | Admitting: Neurology

## 2019-05-02 ENCOUNTER — Other Ambulatory Visit: Payer: Self-pay

## 2019-05-02 ENCOUNTER — Telehealth (HOSPITAL_COMMUNITY): Payer: Self-pay | Admitting: Physical Therapy

## 2019-05-02 VITALS — BP 155/93 | HR 90 | Temp 97.9°F | Ht 66.0 in | Wt 205.0 lb

## 2019-05-02 DIAGNOSIS — G8929 Other chronic pain: Secondary | ICD-10-CM

## 2019-05-02 DIAGNOSIS — M5441 Lumbago with sciatica, right side: Secondary | ICD-10-CM | POA: Diagnosis not present

## 2019-05-02 DIAGNOSIS — M5442 Lumbago with sciatica, left side: Secondary | ICD-10-CM | POA: Diagnosis not present

## 2019-05-02 MED ORDER — GABAPENTIN 300 MG PO CAPS: 300.0000 mg | ORAL_CAPSULE | Freq: Three times a day (TID) | ORAL | 11 refills | Status: DC

## 2019-05-02 NOTE — Patient Instructions (Signed)

## 2019-05-02 NOTE — Telephone Encounter (Signed)
He has another MD apptment and will not be here on 05/03/2019. He will be here on Friday

## 2019-05-02 NOTE — Progress Notes (Signed)
[facility]    Provider:  Dr Jaynee Eagles Requesting Provider: Perlie Mayo, NP Primary Care Provider:  Perlie Mayo, NP  CC:  Back pain  HPI:  Douglas Keller is a 78 y.o. male here as requested by Perlie Mayo, NP for spinal stenosis. MRI in 2017 showed moderately severe multi-level stenosis and neuro-foraminal stenosis and appears he has seen Dr. Frederich Cha for this as recent as January when he ordered CT Myelogram to further investigate.PMHx DM2, gerd, dyslipidemia, chronic LBP and spinal stenosis. He has also already been to [facility]. He has chronic LBP, legs cramping, worse with standing, shoots down the legs, right leg is worse and feels cold. lood flow is good. His feet are not numb or tingly or burning it is more the numbness coming from the low back. No weakness. He had cervical surgery in 2014. He had cervical stenosis. Most important is the legs and the shooting pains down the legs. He has tried Gabapentin in the past and it helped. Otherwise no other issues. No problem walking. He plays golf 7 holes. No other focal neurologic deficits, associated symptoms, inciting events or modifiable factors.  Reviewed notes, labs and imaging from outside physicians, which showed:  Cbc/cmp normal 04/13/2019  MRI cervical spine: 2010 reviewed report (prior to operating in 2014)  1.  Multilevel congenital and acquired cervical stenosis. 2.  C7-T1 right paracentral large disc extrusion with severe central canal stenosis and deformity of the cervical cord. 3.  Multilevel bilateral foraminal narrowing. 4.  Central canal stenosis is moderate at C3-C4, C4-C5 and severe at C5-C6 secondary to disc osteophyte complexes. 5.  Thyroglossal duct cyst.  MRI lumbar spine 2017 showed: (personally reviewed and agree with below) Moderate central canal stenosis L2-3 where there is also mild foraminal narrowing, more notable on the left.  Moderate central canal stenosis L3-4 where there  is moderate to moderately severe bilateral foraminal narrowing due to disc and facet degenerative change.  Moderate central canal stenosis L4-5 where there is mild to moderate bilateral foraminal narrowing.  Shallow left paracentral protrusion at L5-S1 slightly deflects the descending left S1 root but the root does not appear compressed.  Review of Systems: Patient complains of symptoms per HPI as well as the following symptom: low back pain. Pertinent negatives and positives per HPI. All others negative.   Social History   Socioeconomic History  . Marital status: Widowed    Spouse name: Cherly   . Number of children: 1  . Years of education: Not on file  . Highest education level: 12th grade  Occupational History    Comment: Retired   Scientific laboratory technician  . Financial resource strain: Not hard at all  . Food insecurity    Worry: Never true    Inability: Never true  . Transportation needs    Medical: No    Non-medical: No  Tobacco Use  . Smoking status: Never Smoker  . Smokeless tobacco: Never Used  Substance and Sexual Activity  . Alcohol use: No  . Drug use: No  . Sexual activity: Not Currently  Lifestyle  . Physical activity    Days per week: 5 days    Minutes per session: 20 min  . Stress: Rather much  Relationships  . Social connections    Talks on phone: More than three times a week    Gets together: More than three times a week    Attends religious service: More than 4 times per year  Active member of club or organization: No    Attends meetings of clubs or organizations: Never    Relationship status: Widowed  . Intimate partner violence    Fear of current or ex partner: No    Emotionally abused: No    Physically abused: No    Forced sexual activity: No  Other Topics Concern  . Not on file  Social History Narrative   Live alone right now, wife passed in 2009    [place] 2 live in [place]      Enjoys playing golf      Diet: eats  what he likes, sweet, fast food, does not eat a lot of canned or boxed foods.    Does not eat enough fruits and veggies.       Caffeine: 1 cup daily   Water: 3-4 bottles of water daily, Gatorade every now and then         Wears seat belt   Wears sun protection when golfing   Smoke detectors at home   Does not use phone while driving    Family History  Problem Relation Age of Onset  . Asthma Mother   . Hypertension Father   . Colon cancer Neg Hx     Past Medical History:  Diagnosis Date  . Aortic valve sclerosis   . Atherosclerosis of coronary artery without angina pectoris 07/05/2014  . Diabetes mellitus type 2, diet-controlled ([place])   . HTN (hypertension) 07/06/2013  . Hyperlipidemia 07/06/2013    Patient Active Problem List   Diagnosis Date Noted  . Elevated troponin   . Gastroesophageal reflux disease   . Dyslipidemia   . Chest pain 01/29/2019  . Type 2 diabetes mellitus with hyperlipidemia ([place]) 09/01/2018  . Heterozygous familial hypercholesterolemia 09/01/2018  . Hypercholesterolemia 08/21/2015  . Medication management 08/21/2015  . Atherosclerosis of coronary artery without angina pectoris 07/05/2014  . Stiffness of left shoulder joint 07/19/2013  . Hyperlipidemia 07/06/2013  . HTN (hypertension) 07/06/2013  . Cervical spondylosis without myelopathy 02/13/2013  . Degeneration of cervical intervertebral disc 02/13/2013  . Spinal stenosis in cervical region 02/13/2013    Past Surgical History:  Procedure Laterality Date  . CERVICAL SPINE SURGERY    . COLONOSCOPY  07/26/2012   Procedure: COLONOSCOPY;  Surgeon: Jamesetta So, MD;  [facility];  Service: Gastroenterology;  Laterality: N/A;  . KNEE ARTHROSCOPY      Current Outpatient Medications  Medication Sig Dispense Refill  . acetaminophen (TYLENOL) 650 MG CR tablet Take 1,300 mg by mouth 2 (two) times daily.    Marland Kitchen amLODipine (NORVASC) 10 MG tablet Take 1 tablet (10 mg total) by mouth daily.  30 tablet 6  . aspirin EC 81 MG tablet Take 81 mg by mouth daily.    . Evolocumab (REPATHA SURECLICK) XX123456 MG/ML SOAJ Inject 140 mg into the skin every 14 (fourteen) days. 2 pen 11  . GLUCOSAMINE PO Take 1 tablet by mouth at bedtime.    Marland Kitchen KLOR-CON M20 20 MEQ tablet Take 2 tablets (40 mEq total) by mouth daily. 90 tablet 1  . Multiple Vitamins-Minerals (AIRBORNE PO) Take 1 each by mouth daily.    . pantoprazole (PROTONIX) 40 MG tablet Take 1 tablet (40 mg total) by mouth daily. 15 tablet 0  . valsartan (DIOVAN) 80 MG tablet Take 1 tablet (80 mg total) by mouth daily. (Patient taking differently: Take 40 mg by mouth daily. ) 30 tablet 2  . gabapentin (NEURONTIN) 300 MG  capsule Take 1 capsule (300 mg total) by mouth 3 (three) times daily. 90 capsule 11  . predniSONE (STERAPRED UNI-PAK 21 TAB) 10 MG (21) TBPK tablet Take as directed on package (Patient not taking: Reported on 05/02/2019) 21 each 0   No current facility-administered medications for this visit.     Allergies as of 05/02/2019 - Review Complete 05/02/2019  Allergen Reaction Noted  . Crestor [rosuvastatin] Other (See Comments) 08/26/2017    Vitals: BP (!) 155/93 (BP Location: Right Arm, Patient Position: Sitting)   Pulse 90   Temp 97.9 F (36.6 C) Comment: taken at front door  Ht 5\' 6"  (1.676 m)   Wt 205 lb (93 kg)   BMI 33.09 kg/m  Last Weight:  Wt Readings from Last 1 Encounters:  05/02/19 205 lb (93 kg)   Last Height:   Ht Readings from Last 1 Encounters:  05/02/19 5\' 6"  (1.676 m)     Physical exam: Exam: Gen: NAD, conversant, well nourised, obese, well groomed                     CV: RRR, no MRG. No Carotid Bruits. No peripheral edema, warm, nontender Eyes: Conjunctivae clear without exudates or hemorrhage  Neuro: Detailed Neurologic Exam  Speech:    Speech is normal; fluent and spontaneous with normal comprehension.  Cognition:    The patient is oriented to person, place, and time;     recent and remote  memory intact;     language fluent;     normal attention, concentration,     fund of knowledge Cranial Nerves:    The pupils are equal, round, and reactive to light. Attempted fundoscopy but could not visualize.  Visual fields are full to finger confrontation. Extraocular movements are intact. Trigeminal sensation is intact and the muscles of mastication are normal. The face is symmetric. The palate elevates in the midline. Hearing intact. Voice is normal. Shoulder shrug is normal. The tongue has normal motion without fasciculations.   Coordination:    No dysmetria  Gait:    Good stride and stance  Motor Observation:    No asymmetry, no atrophy, and no involuntary movements noted. Tone:    Normal muscle tone.    Posture:    Posture is normal. normal erect    Strength:    Strength is V/V in the upper and lower limbs.      Sensation: intact to LT     Reflex Exam:  DTR's:    Absent AJs. Deep tendon reflexes in the upper and lower extremities are symmetrical bilaterally.  Not brisk. Toes:    The toes are equiv bilaterally.   Clonus:    Clonus is absent.    Assessment/Plan:  Really lovely 78 y.o. male here as requested by Perlie Mayo, NP for spinal stenosis. MRI in 2017 showed moderately severe multi-level stenosis and neuro-foraminal stenosis and appears he has seen Dr. Frederich Cha for this as recent as January when he ordered CT Myelogram to further investigate.He was referred by Cherly Beach for PT last month. He has chronic neck pain and had cervical decompression in 2014 and symptoms are stable since then. He does not want surgery or injections. He says he did well on Gabapentin 900mg  tid in te past, we can restart and titrate up as needed. Can also consider Lyrica. His exam is surprisingly non focal, good strenth, symmetric reflexes.  Start Gabapentin 300mg  tid, Cherly Beach can further titrate Return to pcp  Meds ordered this encounter  Medications  . gabapentin  (NEURONTIN) 300 MG capsule    Sig: Take 1 capsule (300 mg total) by mouth 3 (three) times daily.    Dispense:  90 capsule    Refill:  11    Cc: Perlie Mayo, NP,  Perlie Mayo, NP  Sarina Ill, MD  [facility] [address] [place], [place] [address]  Phone [phone] Fax [phone]

## 2019-05-03 ENCOUNTER — Ambulatory Visit (HOSPITAL_COMMUNITY): Payer: Medicare HMO | Admitting: Physical Therapy

## 2019-05-04 ENCOUNTER — Telehealth (HOSPITAL_COMMUNITY): Payer: Self-pay | Admitting: Physical Therapy

## 2019-05-04 NOTE — Telephone Encounter (Signed)
pt wants to cancel all of the appts due to he cannot afford the 40.00 co-pay. will close out the referral.

## 2019-05-05 ENCOUNTER — Ambulatory Visit (HOSPITAL_COMMUNITY): Payer: Medicare HMO | Admitting: Physical Therapy

## 2019-05-08 ENCOUNTER — Encounter (HOSPITAL_COMMUNITY): Payer: Medicare HMO | Admitting: Physical Therapy

## 2019-05-10 ENCOUNTER — Encounter (HOSPITAL_COMMUNITY): Payer: Medicare HMO | Admitting: Physical Therapy

## 2019-05-11 ENCOUNTER — Encounter: Payer: Self-pay | Admitting: Family Medicine

## 2019-05-11 ENCOUNTER — Other Ambulatory Visit: Payer: Self-pay

## 2019-05-11 ENCOUNTER — Ambulatory Visit (INDEPENDENT_AMBULATORY_CARE_PROVIDER_SITE_OTHER): Payer: Medicare HMO | Admitting: Family Medicine

## 2019-05-11 VITALS — BP 158/82 | HR 91 | Temp 98.6°F | Resp 15 | Ht 66.0 in | Wt 208.1 lb

## 2019-05-11 DIAGNOSIS — E6609 Other obesity due to excess calories: Secondary | ICD-10-CM

## 2019-05-11 DIAGNOSIS — Z6832 Body mass index (BMI) 32.0-32.9, adult: Secondary | ICD-10-CM | POA: Diagnosis not present

## 2019-05-11 DIAGNOSIS — E66811 Obesity, class 1: Secondary | ICD-10-CM | POA: Insufficient documentation

## 2019-05-11 DIAGNOSIS — I10 Essential (primary) hypertension: Secondary | ICD-10-CM | POA: Diagnosis not present

## 2019-05-11 MED ORDER — BENAZEPRIL HCL 20 MG PO TABS: 20.0000 mg | ORAL_TABLET | Freq: Every day | ORAL | 0 refills | Status: DC

## 2019-05-11 NOTE — Progress Notes (Signed)
Subjective:     [name], male   DOB: 01/19/1941, 78 y.o.   MRN: [id number]  Douglas Keller presents for Hypertension (follow up)  Douglas Keller is a 78 year old male patient that is newly established with me. Presents today for hypertension follow-up.  Last appointment I increased his Diovan to 80 mg secondary to his blood pressure not being well controlled.  He reports that he back down to 40 mg because he said that it was making him pee almost every hour all night.  But he was not having to use the bathroom during the day.  He reports that he takes the medicine during the day.  He does not know why it made him use the bathroom at night like that.  At the increased dosage.  He reports that he has not been exercising but has his treadmill and is trying to get back into walking again daily.  He reports that he does not put salt on food when he is at home.  But he has been eating a lot more fast food.  He has put on a couple of pounds since his last visit.  He does report that he has been eating more fruits and vegetables and greens as we discussed at his last visit.  He denies having any chest pain, palpitations, leg swelling, cough, shortness of breath, dizziness, headache, vision changes or any other signs or symptoms of elevated BP at this time.  Today patient denies signs and symptoms of COVID 19 infection including fever, chills, cough, shortness of breath, and headache.  Past Medical, Surgical, Social History, Allergies, and Medications have been Reviewed.  Past Medical History:  Diagnosis Date  . Aortic valve sclerosis   . Atherosclerosis of coronary artery without angina pectoris 07/05/2014  . Diabetes mellitus type 2, diet-controlled ([place])   . HTN (hypertension) 07/06/2013  . Hyperlipidemia 07/06/2013   Past Surgical History:  Procedure Laterality Date  . CERVICAL SPINE SURGERY    . COLONOSCOPY  07/26/2012   Procedure: COLONOSCOPY;  Surgeon: Jamesetta So, MD;   [facility];  Service: Gastroenterology;  Laterality: N/A;  . KNEE ARTHROSCOPY     Social History   Socioeconomic History  . Marital status: Widowed    Spouse name: Douglas Keller   . Number of children: 1  . Years of education: Not on file  . Highest education level: 12th grade  Occupational History    Comment: Retired   Scientific laboratory technician  . Financial resource strain: Not hard at all  . Food insecurity    Worry: Never true    Inability: Never true  . Transportation needs    Medical: No    Non-medical: No  Tobacco Use  . Smoking status: Never Smoker  . Smokeless tobacco: Never Used  Substance and Sexual Activity  . Alcohol use: No  . Drug use: No  . Sexual activity: Not Currently  Lifestyle  . Physical activity    Days per week: 5 days    Minutes per session: 20 min  . Stress: Rather much  Relationships  . Social connections    Talks on phone: More than three times a week    Gets together: More than three times a week    Attends religious service: More than 4 times per year    Active member of club or organization: No    Attends meetings of clubs or organizations: Never    Relationship status: Widowed  . Intimate partner  violence    Fear of current or ex partner: No    Emotionally abused: No    Physically abused: No    Forced sexual activity: No  Other Topics Concern  . Not on file  Social History Narrative   Live alone right now, wife passed in 2009    [place] 2 live in [place]      Enjoys playing golf      Diet: eats what he likes, sweet, fast food, does not eat a lot of canned or boxed foods.    Does not eat enough fruits and veggies.       Caffeine: 1 cup daily   Water: 3-4 bottles of water daily, Gatorade every now and then         Wears seat belt   Wears sun protection when golfing   Smoke detectors at home   Does not use phone while driving    Outpatient Encounter Medications as of 05/11/2019  Medication Sig  .  acetaminophen (TYLENOL) 650 MG CR tablet Take 1,300 mg by mouth 2 (two) times daily.  Marland Kitchen amLODipine (NORVASC) 10 MG tablet Take 1 tablet (10 mg total) by mouth daily.  Marland Kitchen aspirin EC 81 MG tablet Take 81 mg by mouth daily.  . Evolocumab (REPATHA SURECLICK) XX123456 MG/ML SOAJ Inject 140 mg into the skin every 14 (fourteen) days.  Marland Kitchen gabapentin (NEURONTIN) 300 MG capsule Take 1 capsule (300 mg total) by mouth 3 (three) times daily.  Marland Kitchen GLUCOSAMINE PO Take 1 tablet by mouth at bedtime.  Marland Kitchen KLOR-CON M20 20 MEQ tablet Take 2 tablets (40 mEq total) by mouth daily.  . Multiple Vitamins-Minerals (AIRBORNE PO) Take 1 each by mouth daily.  . pantoprazole (PROTONIX) 40 MG tablet Take 1 tablet (40 mg total) by mouth daily.  . valsartan (DIOVAN) 80 MG tablet Take 1 tablet (80 mg total) by mouth daily. (Patient taking differently: Take 40 mg by mouth daily. )  . [DISCONTINUED] predniSONE (STERAPRED UNI-PAK 21 TAB) 10 MG (21) TBPK tablet Take as directed on package (Patient not taking: Reported on 05/02/2019)   No facility-administered encounter medications on file as of 05/11/2019.    Allergies  Allergen Reactions  . Crestor [Rosuvastatin] Other (See Comments)    Cramps     Review of Systems  Constitutional: Negative for chills and fever.  HENT: Negative.   Eyes: Negative.   Respiratory: Negative.   Cardiovascular: Negative.   Gastrointestinal: Negative.   Endocrine: Negative.   Genitourinary: Negative.   Musculoskeletal: Negative.   Skin: Negative.   Allergic/Immunologic: Negative.   Neurological: Negative.   Hematological: Negative.   Psychiatric/Behavioral: Negative.   All other systems reviewed and are negative.      Objective:     BP (!) 162/86   Pulse 91   Temp 98.6 F (37 C) (Oral)   Resp 15   Ht 5\' 6"  (1.676 m)   Wt 208 lb 1.9 oz (94.4 kg)   SpO2 95%   BMI 33.59 kg/m   Physical Exam Vitals signs and nursing note reviewed.  Constitutional:      Appearance: Normal appearance. He  is well-developed and well-groomed. He is obese.  HENT:     Head: Normocephalic and atraumatic.     Right Ear: External ear normal.     Left Ear: External ear normal.     Nose: Nose normal.     Mouth/Throat:     Mouth: Mucous membranes are moist.     Pharynx:  Oropharynx is clear.  Eyes:     General:        Right eye: No discharge.        Left eye: No discharge.     Conjunctiva/sclera: Conjunctivae normal.  Neck:     Musculoskeletal: Normal range of motion and neck supple.  Cardiovascular:     Rate and Rhythm: Normal rate and regular rhythm.     Pulses: Normal pulses.     Heart sounds: Normal heart sounds.  Pulmonary:     Effort: Pulmonary effort is normal.     Breath sounds: Normal breath sounds.  Musculoskeletal: Normal range of motion.  Skin:    General: Skin is warm.  Neurological:     General: No focal deficit present.     Mental Status: He is alert and oriented to person, place, and time.  Psychiatric:        Attention and Perception: Attention normal.        Mood and Affect: Mood normal.        Speech: Speech normal.        Behavior: Behavior normal. Behavior is cooperative.        Thought Content: Thought content normal.        Cognition and Memory: Cognition normal.        Judgment: Judgment normal.        Assessment and Plan        1. Essential hypertension Blood pressure is currently not well controlled, will be changing him from Diovan to benazepril to see if we can do a titration up of an ACE versus ARB.  Side effects were discussed with patient he is in agreements with change in medication.  He had requested not to be on a diuretic because he does not like having to go the bathroom frequently.  It was discussed with him that he might require being on a diuretic secondary to getting his blood pressure better under control.  At this time he would like to refrain from it though.  - benazepril (LOTENSIN) 20 MG tablet; Take 1 tablet (20 mg total) by mouth daily.   Dispense: 30 tablet; Refill: 0   2. Class 1 obesity due to excess calories with serious comorbidity and body mass index (BMI) of 32.0 to 32.9 in adult Deteriorated Douglas Keller is re-educated about the importance of exercise daily to help with weight management. A minumum of 30 minutes daily is recommended. Additionally, importance of healthy food choices  with portion control discussed.   Wt Readings from Last 3 Encounters:  05/11/19 208 lb 1.9 oz (94.4 kg)  05/02/19 205 lb (93 kg)  04/13/19 206 lb 1.9 oz (93.5 kg)   Follow-up: 2 weeks  Perlie Mayo, DNP, AGNP-BC [place], [address] [place], [place] [address] Office Hours: Mon-Thurs 8 am-5 pm; Fri 8 am-12 pm Office Phone:  [phone]  Office Fax: [phone]

## 2019-05-11 NOTE — Patient Instructions (Addendum)
  I appreciate the opportunity to provide you with the care for your health and wellness. Today we discussed: blood pressure   Follow up: 2 weeks   No labs or referrals today  Stop taking Diovan 40 mg  Start taking Benazepril 20 mg   Start walking daily 30-60 minutes :) Continue with diet changes to eat greens and veggies and fruits.  Please continue to practice social distancing to keep you, your family, and our community safe.  If you must go out, please wear a mask and practice good handwashing.  It was a pleasure to see you and I look forward to continuing to work together on your health and well-being. Please do not hesitate to call the office if you need care or have questions about your care.  Have a wonderful day and week. With Gratitude, Cherly Beach, DNP, AGNP-BC

## 2019-05-15 ENCOUNTER — Encounter (HOSPITAL_COMMUNITY): Payer: Medicare HMO | Admitting: Physical Therapy

## 2019-05-17 ENCOUNTER — Encounter (HOSPITAL_COMMUNITY): Payer: Medicare HMO | Admitting: Physical Therapy

## 2019-05-22 ENCOUNTER — Encounter (HOSPITAL_COMMUNITY): Payer: Medicare HMO | Admitting: Physical Therapy

## 2019-05-22 ENCOUNTER — Other Ambulatory Visit: Payer: Self-pay

## 2019-05-22 DIAGNOSIS — E785 Hyperlipidemia, unspecified: Secondary | ICD-10-CM

## 2019-05-24 ENCOUNTER — Encounter (HOSPITAL_COMMUNITY): Payer: Medicare HMO | Admitting: Physical Therapy

## 2019-05-25 ENCOUNTER — Encounter: Payer: Self-pay | Admitting: Family Medicine

## 2019-05-25 ENCOUNTER — Other Ambulatory Visit: Payer: Self-pay

## 2019-05-25 ENCOUNTER — Ambulatory Visit (INDEPENDENT_AMBULATORY_CARE_PROVIDER_SITE_OTHER): Payer: Medicare HMO | Admitting: Family Medicine

## 2019-05-25 VITALS — BP 130/80 | HR 81 | Temp 98.7°F | Resp 15 | Ht 66.0 in | Wt 208.0 lb

## 2019-05-25 DIAGNOSIS — E1169 Type 2 diabetes mellitus with other specified complication: Secondary | ICD-10-CM

## 2019-05-25 DIAGNOSIS — M4802 Spinal stenosis, cervical region: Secondary | ICD-10-CM | POA: Diagnosis not present

## 2019-05-25 DIAGNOSIS — E6609 Other obesity due to excess calories: Secondary | ICD-10-CM

## 2019-05-25 DIAGNOSIS — K219 Gastro-esophageal reflux disease without esophagitis: Secondary | ICD-10-CM

## 2019-05-25 DIAGNOSIS — E785 Hyperlipidemia, unspecified: Secondary | ICD-10-CM

## 2019-05-25 DIAGNOSIS — I10 Essential (primary) hypertension: Secondary | ICD-10-CM

## 2019-05-25 DIAGNOSIS — Z6833 Body mass index (BMI) 33.0-33.9, adult: Secondary | ICD-10-CM | POA: Diagnosis not present

## 2019-05-25 MED ORDER — AMLODIPINE BESYLATE 10 MG PO TABS: 10.0000 mg | ORAL_TABLET | Freq: Every day | ORAL | 6 refills | Status: DC

## 2019-05-25 MED ORDER — BENAZEPRIL HCL 20 MG PO TABS: 20.0000 mg | ORAL_TABLET | Freq: Every day | ORAL | 0 refills | Status: DC

## 2019-05-25 MED ORDER — GABAPENTIN 300 MG PO CAPS: 300.0000 mg | ORAL_CAPSULE | Freq: Three times a day (TID) | ORAL | 11 refills | Status: DC

## 2019-05-25 MED ORDER — KLOR-CON M20 20 MEQ PO TBCR: 20.0000 meq | EXTENDED_RELEASE_TABLET | Freq: Once | ORAL | 0 refills | Status: DC

## 2019-05-25 MED ORDER — PANTOPRAZOLE SODIUM 40 MG PO TBEC: 40.0000 mg | DELAYED_RELEASE_TABLET | Freq: Every day | ORAL | 1 refills | Status: DC

## 2019-05-25 MED ORDER — AMLODIPINE BESYLATE 10 MG PO TABS: 10.0000 mg | ORAL_TABLET | Freq: Every day | ORAL | 1 refills | Status: DC

## 2019-05-25 MED ORDER — PANTOPRAZOLE SODIUM 40 MG PO TBEC: 40.0000 mg | DELAYED_RELEASE_TABLET | Freq: Every day | ORAL | 0 refills | Status: DC

## 2019-05-25 MED ORDER — GABAPENTIN 300 MG PO CAPS: 300.0000 mg | ORAL_CAPSULE | Freq: Three times a day (TID) | ORAL | 3 refills | Status: DC

## 2019-05-25 NOTE — Patient Instructions (Addendum)
  I appreciate the opportunity to provide you with the care for your health and wellness. Today we discussed: blood pressure  Follow up: 3 month   Get labs 1 week before that appt  Continue to take all medications as directed.  Use tylenol extra strength for back pain or biofreeze or muscle rub can help as well.  I will look into the [place] and let you know if it is safe.   Continue on the no sugar journey. I am glad you have stopped the sweets. Work on daily exercise 30-60 minutes.  Please continue to practice social distancing to keep you, your family, and our community safe.  If you must go out, please wear a mask and practice good handwashing.  It was a pleasure to see you and I look forward to continuing to work together on your health and well-being. Please do not hesitate to call the office if you need care or have questions about your care.  Have a wonderful day and week. With Gratitude, Cherly Beach, DNP, AGNP-BC

## 2019-05-25 NOTE — Progress Notes (Signed)
Subjective:     [name], male   DOB: 01-01-1941, 78 y.o.   MRN: [id number]  Douglas Keller presents for Hypertension (2 week follow up)  Mr. Rosenstock is a 78 year old male patient that is newly established with me. Presents today for hypertension follow-up.    On 05/11/2019 We d/c his Diovan because he reported it made him void too much. Today he reports he is tolerating his benazepril 20 mg and taking it daily. He also reports not eating sweets anymore. He reports overall feeling better. Not currently exercising, but really wants to get back to gym, but does not feel it is safe to do so. Weight has not changed much even with reduction in sweets. He still reports that he has been eating more fruits and vegetables and greens as we discussed at his last visit.  He denies having any chest pain, palpitations, leg swelling, cough, shortness of breath, dizziness, headache, vision changes or any other signs or symptoms of elevated BP at this time.  Today patient denies signs and symptoms of COVID 19 infection including fever, chills, cough, shortness of breath, and headache.  Past Medical, Surgical, Social History, Allergies, and Medications have been Reviewed. Past Medical History:  Diagnosis Date  . Aortic valve sclerosis   . Atherosclerosis of coronary artery without angina pectoris 07/05/2014  . Diabetes mellitus type 2, diet-controlled ([place])   . HTN (hypertension) 07/06/2013  . Hyperlipidemia 07/06/2013   Past Surgical History:  Procedure Laterality Date  . CERVICAL SPINE SURGERY    . COLONOSCOPY  07/26/2012   Procedure: COLONOSCOPY;  Surgeon: Jamesetta So, MD;  Location: [facility];  Service: Gastroenterology;  Laterality: N/A;  . KNEE ARTHROSCOPY     Social History   Socioeconomic History  . Marital status: Widowed    Spouse name: Douglas Keller   . Number of children: 1  . Years of education: Not on file  . Highest education level: 12th grade  Occupational History     Comment: Retired   Scientific laboratory technician  . Financial resource strain: Not hard at all  . Food insecurity    Worry: Never true    Inability: Never true  . Transportation needs    Medical: No    Non-medical: No  Tobacco Use  . Smoking status: Never Smoker  . Smokeless tobacco: Never Used  Substance and Sexual Activity  . Alcohol use: No  . Drug use: No  . Sexual activity: Not Currently  Lifestyle  . Physical activity    Days per week: 5 days    Minutes per session: 20 min  . Stress: Rather much  Relationships  . Social connections    Talks on phone: More than three times a week    Gets together: More than three times a week    Attends religious service: More than 4 times per year    Active member of club or organization: No    Attends meetings of clubs or organizations: Never    Relationship status: Widowed  . Intimate partner violence    Fear of current or ex partner: No    Emotionally abused: No    Physically abused: No    Forced sexual activity: No  Other Topics Concern  . Not on file  Social History Narrative   Live alone right now, wife passed in 2009    [place] [address] live in [place]      Enjoys playing golf  Diet: eats what he likes, sweet, fast food, does not eat a lot of canned or boxed foods.    Does not eat enough fruits and veggies.       Caffeine: 1 cup daily   Water: 3-4 bottles of water daily, Gatorade every now and then         Wears seat belt   Wears sun protection when golfing   Smoke detectors at home   Does not use phone while driving    Outpatient Encounter Medications as of 05/25/2019  Medication Sig  . acetaminophen (TYLENOL) 650 MG CR tablet Take 1,300 mg by mouth 2 (two) times daily.  Marland Kitchen amLODipine (NORVASC) 10 MG tablet Take 1 tablet (10 mg total) by mouth daily.  Marland Kitchen aspirin EC 81 MG tablet Take 81 mg by mouth daily.  . benazepril (LOTENSIN) 20 MG tablet Take 1 tablet (20 mg total) by mouth daily.  . Evolocumab (REPATHA  SURECLICK) XX123456 MG/ML SOAJ Inject 140 mg into the skin every 14 (fourteen) days.  Marland Kitchen gabapentin (NEURONTIN) 300 MG capsule Take 1 capsule (300 mg total) by mouth 3 (three) times daily.  Marland Kitchen GLUCOSAMINE PO Take 1 tablet by mouth at bedtime.  Marland Kitchen KLOR-CON M20 20 MEQ tablet Take 1 tablet (20 mEq total) by mouth once for 1 dose.  . Multiple Vitamins-Minerals (AIRBORNE PO) Take 1 each by mouth daily.  . pantoprazole (PROTONIX) 40 MG tablet Take 1 tablet (40 mg total) by mouth daily.  . [DISCONTINUED] amLODipine (NORVASC) 10 MG tablet Take 1 tablet (10 mg total) by mouth daily.  . [DISCONTINUED] benazepril (LOTENSIN) 20 MG tablet Take 1 tablet (20 mg total) by mouth daily.  . [DISCONTINUED] gabapentin (NEURONTIN) 300 MG capsule Take 1 capsule (300 mg total) by mouth 3 (three) times daily.  . [DISCONTINUED] KLOR-CON M20 20 MEQ tablet Take 2 tablets (40 mEq total) by mouth daily.  . [DISCONTINUED] pantoprazole (PROTONIX) 40 MG tablet Take 1 tablet (40 mg total) by mouth daily.   No facility-administered encounter medications on file as of 05/25/2019.    Allergies  Allergen Reactions  . Crestor [Rosuvastatin] Other (See Comments)    Cramps     Review of Systems  Constitutional: Negative for chills and fever.  HENT: Negative.   Eyes: Negative.   Respiratory: Negative.   Cardiovascular: Negative.   Gastrointestinal: Negative.   Endocrine: Negative.   Genitourinary: Negative.   Musculoskeletal: Negative.   Skin: Negative.   Allergic/Immunologic: Negative.   Neurological: Negative.   Hematological: Negative.   Psychiatric/Behavioral: Negative.   All other systems reviewed and are negative.      Objective:     BP 130/80   Pulse 81   Temp 98.7 F (37.1 C) (Oral)   Resp 15   Ht 5\' 6"  (1.676 m)   Wt 208 lb 0.6 oz (94.4 kg)   SpO2 96%   BMI 33.58 kg/m   Physical Exam Vitals signs and nursing note reviewed.  Constitutional:      Appearance: Normal appearance. He is well-developed and  well-groomed. He is obese.  HENT:     Head: Normocephalic and atraumatic.     Right Ear: External ear normal.     Left Ear: External ear normal.     Nose: Nose normal.     Mouth/Throat:     Mouth: Mucous membranes are moist.     Pharynx: Oropharynx is clear.  Eyes:     General:        Right eye: No  discharge.        Left eye: No discharge.     Conjunctiva/sclera: Conjunctivae normal.  Neck:     Musculoskeletal: Normal range of motion and neck supple.  Cardiovascular:     Rate and Rhythm: Normal rate and regular rhythm.     Pulses: Normal pulses.     Heart sounds: Normal heart sounds.  Pulmonary:     Effort: Pulmonary effort is normal.     Breath sounds: Normal breath sounds.  Musculoskeletal: Normal range of motion.  Skin:    General: Skin is warm.  Neurological:     General: No focal deficit present.     Mental Status: He is alert and oriented to person, place, and time.  Psychiatric:        Attention and Perception: Attention normal.        Mood and Affect: Mood normal.        Speech: Speech normal.        Behavior: Behavior normal. Behavior is cooperative.        Thought Content: Thought content normal.        Cognition and Memory: Cognition normal.        Judgment: Judgment normal.        Assessment and Plan        1. Type 2 diabetes mellitus with hyperlipidemia (HCC) Trenell Parrow is encouraged to check blood sugar daily as directed. Continue current medications. Can not tolerate statins. On Repatha Educated on importance of maintain a well balanced diabetic friendly diet. he is reminded the importance of maintaining  good blood sugars,  taking medications as directed, daily foot care, annual eye exams. Additionally educated about keeping good control over blood pressure and cholesterol as well. Will get A1c at next appt  - Hemoglobin A1c  2. Essential hypertension Shaphan Kendrick is encouraged to maintain a well balanced diet that is low in salt.  Improved with controlled, continue current medication regimen. Refills provided   Additionally, he is also reminded that exercise is beneficial for heart health and control of  Blood pressure. 30-60 minutes daily is recommended-walking was suggested.   - CBC - COMPLETE METABOLIC PANEL WITH GFR - amLODipine (NORVASC) 10 MG tablet; Take 1 tablet (10 mg total) by mouth daily.  Dispense: 30 tablet; Refill: 6 - benazepril (LOTENSIN) 20 MG tablet; Take 1 tablet (20 mg total) by mouth daily.  Dispense: 30 tablet; Refill: 0 - KLOR-CON M20 20 MEQ tablet; Take 1 tablet (20 mEq total) by mouth once for 1 dose.  Dispense: 1 tablet; Refill: 0  3. Class 1 obesity due to excess calories with serious comorbidity and body mass index (BMI) of 33.0 to 33.9 in adult Unchanged  Taytum Lail is re-educated about the importance of exercise daily to help with weight management. A minumum of 30 minutes daily is recommended. Additionally, importance of healthy food choices  with portion control discussed. Encouraged to continue to avoid sweets.  Wt Readings from Last 3 Encounters:  05/25/19 208 lb 0.6 oz (94.4 kg)  05/11/19 208 lb 1.9 oz (94.4 kg)  05/02/19 205 lb (93 kg)     4. Hyperlipidemia, unspecified hyperlipidemia type Advised to maintain a heart healthy diet Recheck of lipid panel prior to next appointment is on Repatha at this time.  - Lipid panel  5. Spinal stenosis in cervical region Refills provided through [employer]   - gabapentin (NEURONTIN) 300 MG capsule; Take 1 capsule (300 mg total) by mouth 3 (three) times daily.  Dispense: 90 capsule; Refill: 11  6. Gastroesophageal reflux disease without esophagitis Refills provided through [employer]  - pantoprazole (PROTONIX) 40 MG tablet; Take 1 tablet (40 mg total) by mouth daily.  Dispense: 15 tablet; Refill: 0  Follow Up: 3 months 08/25/2019  Perlie Mayo, DNP, AGNP-BC [place], [address], [place] [address] Office Hours: Mon-Thurs 8 am-5 pm; Fri 8 am-12 pm Office Phone:  [phone]  Office Fax: [phone]

## 2019-05-26 ENCOUNTER — Telehealth: Payer: Self-pay

## 2019-05-26 NOTE — Telephone Encounter (Signed)
Advised patient of treatment plan with verbal understanding. He stated he will probably go out to the urgent care on [address]

## 2019-05-26 NOTE — Telephone Encounter (Signed)
Patient called and stated he had a "bad night". He had "blacking out spells" and has been sitting in a recliner all night. He stated he was advised to call if he had any issues. Please advise

## 2019-06-01 ENCOUNTER — Other Ambulatory Visit: Payer: Self-pay | Admitting: Family Medicine

## 2019-06-01 DIAGNOSIS — I10 Essential (primary) hypertension: Secondary | ICD-10-CM

## 2019-06-01 MED ORDER — KLOR-CON M20 20 MEQ PO TBCR: 20.0000 meq | EXTENDED_RELEASE_TABLET | Freq: Every day | ORAL | 0 refills | Status: DC

## 2019-06-02 DIAGNOSIS — E785 Hyperlipidemia, unspecified: Secondary | ICD-10-CM | POA: Diagnosis not present

## 2019-06-03 LAB — LIPID PANEL
Chol/HDL Ratio: 2.6 ratio (ref 0.0–5.0)
Cholesterol, Total: 142 mg/dL (ref 100–199)
HDL: 55 mg/dL (ref >39)
LDL Chol Calc (NIH): 67 mg/dL (ref 0–99)
Triglycerides: 112 mg/dL (ref 0–149)
VLDL Cholesterol Cal: 20 mg/dL (ref 5–40)

## 2019-06-05 ENCOUNTER — Other Ambulatory Visit: Payer: Self-pay

## 2019-06-05 ENCOUNTER — Telehealth: Payer: Self-pay | Admitting: *Deleted

## 2019-06-05 DIAGNOSIS — M4802 Spinal stenosis, cervical region: Secondary | ICD-10-CM

## 2019-06-05 MED ORDER — GABAPENTIN 300 MG PO CAPS: 300.0000 mg | ORAL_CAPSULE | Freq: Three times a day (TID) | ORAL | 0 refills | Status: DC

## 2019-06-05 NOTE — Telephone Encounter (Signed)
Amber a Education administrator with [employer] called wanting to see if gabapentin 300 mg could be sent to [place] as patient is out of medication and they have to mail it out to him so itll be atleast a week before he gets it.

## 2019-06-05 NOTE — Telephone Encounter (Signed)
Bridge supply sent

## 2019-06-08 ENCOUNTER — Other Ambulatory Visit: Payer: Self-pay

## 2019-06-08 ENCOUNTER — Telehealth: Payer: Self-pay | Admitting: *Deleted

## 2019-06-08 DIAGNOSIS — I10 Essential (primary) hypertension: Secondary | ICD-10-CM

## 2019-06-08 MED ORDER — BENAZEPRIL HCL 20 MG PO TABS: 20.0000 mg | ORAL_TABLET | Freq: Every day | ORAL | 0 refills | Status: DC

## 2019-06-08 NOTE — Telephone Encounter (Signed)
Pt needs benzapril sent to [facility] he doesn't have any and [facility] has to come in the mail. He just needs some sent in to hold him over until [place] gets to him. This can be [facility] in [place]

## 2019-06-08 NOTE — Telephone Encounter (Signed)
Two weeks worth of medication sent to [facility] to hold patient until he can receive delivery from [facility]

## 2019-06-13 ENCOUNTER — Telehealth: Payer: Self-pay | Admitting: Pharmacist

## 2019-06-13 NOTE — Telephone Encounter (Signed)
Called the pt back to see what they had lmomed me about and the pt was wondering if they were approved for amgen but they are not up for renewal with that until later. I advised the pt that I would contact them asap and let them know when a determination has been made. I also told the pt that if the snf doesn't work that we could try the hw foundation to get the repatha. Pt voiced understanding

## 2019-06-13 NOTE — Telephone Encounter (Signed)
Patient calling back. No additional information provided.

## 2019-06-19 ENCOUNTER — Ambulatory Visit (INDEPENDENT_AMBULATORY_CARE_PROVIDER_SITE_OTHER): Payer: Medicare HMO | Admitting: Family Medicine

## 2019-06-19 ENCOUNTER — Encounter: Payer: Self-pay | Admitting: Family Medicine

## 2019-06-19 ENCOUNTER — Other Ambulatory Visit: Payer: Self-pay

## 2019-06-19 VITALS — BP 170/92 | HR 78 | Temp 97.7°F | Ht 66.0 in | Wt 211.0 lb

## 2019-06-19 DIAGNOSIS — E669 Obesity, unspecified: Secondary | ICD-10-CM | POA: Diagnosis not present

## 2019-06-19 DIAGNOSIS — I10 Essential (primary) hypertension: Secondary | ICD-10-CM | POA: Diagnosis not present

## 2019-06-19 DIAGNOSIS — E1169 Type 2 diabetes mellitus with other specified complication: Secondary | ICD-10-CM

## 2019-06-19 DIAGNOSIS — E785 Hyperlipidemia, unspecified: Secondary | ICD-10-CM | POA: Diagnosis not present

## 2019-06-19 MED ORDER — HYDROCHLOROTHIAZIDE 12.5 MG PO CAPS: 12.5000 mg | ORAL_CAPSULE | Freq: Every day | ORAL | 0 refills | Status: DC

## 2019-06-19 NOTE — Patient Instructions (Addendum)
Office visit with DNP in 2 to 3 weeks, call if needed sooner  New additional medication at [facility] is HCTZ 12.5 mg once daily  PLEASE increase benazepril 20 mg to TWO (40 mg) once  Daily at 9 in the morning  Continue amlodipine  Please get non fasting HBA1C, chem7 and EGFr in 10 days

## 2019-06-21 ENCOUNTER — Encounter: Payer: Self-pay | Admitting: Family Medicine

## 2019-06-21 DIAGNOSIS — E669 Obesity, unspecified: Secondary | ICD-10-CM | POA: Insufficient documentation

## 2019-06-21 NOTE — Progress Notes (Signed)
Douglas Keller     MRN: [id number]      DOB: October 09, 1940   HPI Douglas Keller is here for evaluation of blood pressure. States that it has been elevated, reading at home today 180/1000, states his blood pressure has been uncontrolled " for years" C/o light headedness. Denies chest pain, PND, orthopnea or leg swelling Denies vision disturbance or any new weakness or numbness  ROS See HPI  Denies recent fever or chills. Denies sinus pressure, nasal congestion, ear pain or sore throat. Denies chest congestion, productive cough or wheezing.    PE  BP (!) 170/92   Pulse 78   Temp 97.7 F (36.5 C) (Temporal)   Ht 5' 6" (1.676 m)   Wt 211 lb (95.7 kg)   SpO2 98%   BMI 34.06 kg/m   Patient alert and oriented and in no cardiopulmonary distress.  HEENT: No facial asymmetry, EOMI,     Neck supple .  Chest: Clear to auscultation bilaterally.  CVS: S1, S2 no murmurs, no S3.Regular rate.  ABD: Soft non tender.   Ext: No edema    Psych: Good eye contact, normal affect. Memory intact not anxious or depressed appearing.  CNS: CN 2-12 intact, power,  normal throughout.no focal deficits noted.   Assessment & Plan  HTN (hypertension) Uncontrolled , incraese benazepril to TWO tablets daily ( has a lot reportedly) so nEW dse benazepril is 40 mg daily, andadd HCTZ 12.5 mg daily. Continue amlodipine 10 mg daily Rept chem 7 and EGFR in 2 weeks and f/u with [name] in office Rduced salt diet, regular exercisr , goal of 30 mins 5 days/ week and weight loss recommended.   Type 2 diabetes mellitus with hyperlipidemia ([place]) Reports unaware that he is diabetic, will need to be addressed wiht patient at next visit, also will benefit from diabetic education   Obesity (BMI 30.0-34.9)  Patient re-educated about  the importance of commitment to a  minimum of 150 minutes of exercise per week as able.  The importance of healthy food choices with portion control discussed,   Weight /BMI  06/19/2019 05/25/2019 05/11/2019  WEIGHT 211 lb 208 lb 0.6 oz 208 lb 1.9 oz  HEIGHT 5' 6" 5' 6" 5' 6"  BMI 34.06 kg/m2 33.58 kg/m2 33.59 kg/m2

## 2019-06-21 NOTE — Assessment & Plan Note (Signed)
  Patient re-educated about  the importance of commitment to a  minimum of 150 minutes of exercise per week as able.  The importance of healthy food choices with portion control discussed,   Weight /BMI 06/19/2019 05/25/2019 05/11/2019  WEIGHT 211 lb 208 lb 0.6 oz 208 lb 1.9 oz  HEIGHT 5\' 6"  5\' 6"  5\' 6"   BMI 34.06 kg/m2 33.58 kg/m2 33.59 kg/m2

## 2019-06-21 NOTE — Assessment & Plan Note (Signed)
Uncontrolled , incraese benazepril to TWO tablets daily ( has a lot reportedly) so nEW dse benazepril is 40 mg daily, andadd HCTZ 12.5 mg daily. Continue amlodipine 10 mg daily Rept chem 7 and EGFR in 2 weeks and f/u with [name] in office Rduced salt diet, regular exercisr , goal of 30 mins 5 days/ week and weight loss recommended.  

## 2019-06-21 NOTE — Assessment & Plan Note (Signed)
Reports unaware that he is diabetic, will need to be addressed wiht patient at next visit, also will benefit from diabetic education

## 2019-06-22 ENCOUNTER — Other Ambulatory Visit: Payer: Self-pay

## 2019-06-22 ENCOUNTER — Telehealth: Payer: Self-pay | Admitting: Cardiovascular Disease

## 2019-06-22 MED ORDER — REPATHA SURECLICK 140 MG/ML ~~LOC~~ SOAJ: 140.0000 mg | SUBCUTANEOUS | 11 refills | Status: DC

## 2019-06-22 MED ORDER — BLOOD GLUCOSE METER KIT: PACK | 0 refills | Status: DC

## 2019-06-22 NOTE — Telephone Encounter (Signed)
New Message     Pt says he would like to talk to Raquel about medication he has been taking for a year. He says it is about Repatha     Please call back

## 2019-06-22 NOTE — Telephone Encounter (Signed)
Called and spoke w/pt regarding repatha and the pt was [employer] approved for a grant of $2500 so rx sent and instructed the pt to take the card to pharmacy.pt voiced understanding

## 2019-06-28 ENCOUNTER — Other Ambulatory Visit: Payer: Self-pay

## 2019-06-28 DIAGNOSIS — E785 Hyperlipidemia, unspecified: Secondary | ICD-10-CM | POA: Diagnosis not present

## 2019-06-28 DIAGNOSIS — E1169 Type 2 diabetes mellitus with other specified complication: Secondary | ICD-10-CM | POA: Diagnosis not present

## 2019-06-28 MED ORDER — ACCU-CHEK SOFTCLIX LANCETS MISC: 0 refills | Status: DC

## 2019-06-28 MED ORDER — GLUCOSE BLOOD VI STRP: ORAL_STRIP | 0 refills | Status: DC

## 2019-06-29 ENCOUNTER — Other Ambulatory Visit: Payer: Self-pay | Admitting: Family Medicine

## 2019-06-29 LAB — BASIC METABOLIC PANEL WITH GFR
BUN: 12 mg/dL (ref 7–25)
CO2: 30 mmol/L (ref 20–32)
Calcium: 9.4 mg/dL (ref 8.6–10.3)
Chloride: 106 mmol/L (ref 98–110)
Creat: 1.13 mg/dL (ref 0.70–1.18)
GFR, Est African American: 72 mL/min/{1.73_m2} (ref >=60)
GFR, Est Non African American: 62 mL/min/{1.73_m2} (ref >=60)
Glucose, Bld: 122 mg/dL — ABNORMAL HIGH (ref 65–99)
Potassium: 4.3 mmol/L (ref 3.5–5.3)
Sodium: 144 mmol/L (ref 135–146)

## 2019-06-29 LAB — HEMOGLOBIN A1C
Hgb A1c MFr Bld: 6.5 % of total Hgb — ABNORMAL HIGH (ref <5.7)
Mean Plasma Glucose: 140 (calc)
eAG (mmol/L): 7.7 (calc)

## 2019-07-07 ENCOUNTER — Encounter: Payer: Self-pay | Admitting: Family Medicine

## 2019-07-07 ENCOUNTER — Other Ambulatory Visit: Payer: Self-pay

## 2019-07-07 ENCOUNTER — Ambulatory Visit (INDEPENDENT_AMBULATORY_CARE_PROVIDER_SITE_OTHER): Payer: Medicare HMO | Admitting: Family Medicine

## 2019-07-07 VITALS — BP 158/88 | HR 77 | Temp 97.3°F | Resp 15 | Ht 66.0 in | Wt 210.0 lb

## 2019-07-07 DIAGNOSIS — F321 Major depressive disorder, single episode, moderate: Secondary | ICD-10-CM

## 2019-07-07 DIAGNOSIS — I10 Essential (primary) hypertension: Secondary | ICD-10-CM

## 2019-07-07 MED ORDER — BENAZEPRIL HCL 40 MG PO TABS: 40.0000 mg | ORAL_TABLET | Freq: Every day | ORAL | 1 refills | Status: DC

## 2019-07-07 MED ORDER — BUPROPION HCL ER (SR) 150 MG PO TB12: 150.0000 mg | ORAL_TABLET | Freq: Two times a day (BID) | ORAL | 0 refills | Status: DC

## 2019-07-07 MED ORDER — CHLORTHALIDONE 25 MG PO TABS: 25.0000 mg | ORAL_TABLET | Freq: Every day | ORAL | 0 refills | Status: DC

## 2019-07-07 NOTE — Progress Notes (Signed)
Subjective:  [name], male    DOB: 08/07/1940  Age: 78 y.o. MRN: [id number]  CC:  Chief Complaint  Patient presents with  . Hypertension    bp evaluation      HPI  HPI Douglas Keller is here for follow-up again on pressure.  Recently seen by Dr. Moshe Cipro 2 weeks ago upon which she increased his benazepril to 40 mg and added hydrochlorothiazide in addition to continuing Norvasc.  Reports that he still seeing pressures in the 220U-542'H systolically.  Over diastolic numbers of 06C to 90s.  Reports he is taking his medication as directed.  And without issue.  Reports that he can go up on his dose of hydrochlorothiazide because it causes severe cramping.  Is willing to adjust medication if needed.  Reports that he has been walking and doing his exercises but he reports that he has been gaining weight rather than losing weight.  Has reported that he stopped eating as much sweets that he was eating.  Denies having any signs and symptoms of elevated blood pressure including but not limited to chest pain, palpitations, leg swelling, vision changes, headaches, dizziness, shortness of breath.  He reports that he has some depression.  Reports that it is predominantly when he is more isolated and it is gotten worse this year secondary to Covid.  Reports he is about 4 hours away from his family who he does try to see frequently but has been avoiding due to Covid.  Does communicate with him frequently on the phone.  Reports that he promised his wife that they would keep the house until his children could return to live back in it when their children went away to school.  They still report that they will come live with him after their children get dental school.  Which she reports is roughly 3 to 4 years from now.  Today patient denies signs and symptoms of COVID 19 infection including fever, chills, cough, shortness of breath, and headache. Past Medical, Surgical, Social History, Allergies,  and Medications have been Reviewed.   Past Medical History:  Diagnosis Date  . Aortic valve sclerosis   . Atherosclerosis of coronary artery without angina pectoris 07/05/2014  . Diabetes mellitus type 2, diet-controlled ([place])   . HTN (hypertension) 07/06/2013  . Hyperlipidemia 07/06/2013    Current Meds  Medication Sig  . Accu-Chek Softclix Lancets lancets Use to test blood sugar once daily  . acetaminophen (TYLENOL) 650 MG CR tablet Take 1,300 mg by mouth 2 (two) times daily.  Marland Kitchen amLODipine (NORVASC) 10 MG tablet Take 1 tablet (10 mg total) by mouth daily.  Marland Kitchen aspirin EC 81 MG tablet Take 81 mg by mouth daily.  . benazepril (LOTENSIN) 40 MG tablet Take 1 tablet (40 mg total) by mouth daily.  . blood glucose meter kit and supplies Dispense based on patient and insurance preference. Use to test blood sugar once daily. (FOR ICD-10 E10.9, E11.9).  Marland Kitchen Evolocumab (REPATHA SURECLICK) 376 MG/ML SOAJ Inject 140 mg into the skin every 14 (fourteen) days.  Marland Kitchen gabapentin (NEURONTIN) 300 MG capsule Take 1 capsule (300 mg total) by mouth 3 (three) times daily.  Marland Kitchen GLUCOSAMINE PO Take 1 tablet by mouth at bedtime.  Marland Kitchen glucose blood test strip Use to test blood sugar once daily  . KLOR-CON M20 20 MEQ tablet Take 1 tablet (20 mEq total) by mouth daily.  . Multiple Vitamins-Minerals (AIRBORNE PO) Take 1 each by mouth daily.  Marland Kitchen  pantoprazole (PROTONIX) 40 MG tablet Take 1 tablet (40 mg total) by mouth daily.  Marland Kitchen SHINGRIX injection   . [DISCONTINUED] benazepril (LOTENSIN) 40 MG tablet Take 40 mg by mouth daily.  . [DISCONTINUED] hydrochlorothiazide (MICROZIDE) 12.5 MG capsule Take 1 capsule (12.5 mg total) by mouth daily.    ROS:  Review of Systems  Constitutional: Negative.   HENT: Negative.   Eyes: Negative.   Respiratory: Negative.   Cardiovascular: Negative.   Gastrointestinal: Negative.   Genitourinary: Negative.   Musculoskeletal: Negative.   Skin: Negative.   Neurological: Negative.     Endo/Heme/Allergies: Negative.   Psychiatric/Behavioral: Positive for depression.  All other systems reviewed and are negative.    Objective:   Today's Vitals: BP (!) 158/88   Pulse 77   Temp (!) 97.3 F (36.3 C) (Temporal)   Resp 15   Ht 5' 6" (1.676 m)   Wt 210 lb 0.6 oz (95.3 kg)   SpO2 97%   BMI 33.90 kg/m  Vitals with BMI 07/07/2019 06/19/2019 05/25/2019  Height 5' 6" 5' 6" 5' 6"  Weight 210 lbs 1 oz 211 lbs 208 lbs 1 oz  BMI 33.92 93.57 01.77  Systolic 939 030 092  Diastolic 88 92 80  Pulse 77 78 81     Physical Exam Vitals and nursing note reviewed.  Constitutional:      Appearance: Normal appearance. He is well-developed and well-groomed. He is obese.  HENT:     Head: Normocephalic and atraumatic.     Right Ear: External ear normal.     Left Ear: External ear normal.     Nose: Nose normal.     Mouth/Throat:     Mouth: Mucous membranes are moist.     Pharynx: Oropharynx is clear.  Eyes:     General:        Right eye: No discharge.        Left eye: No discharge.     Conjunctiva/sclera: Conjunctivae normal.  Cardiovascular:     Rate and Rhythm: Normal rate and regular rhythm.     Pulses: Normal pulses.     Heart sounds: Normal heart sounds.  Pulmonary:     Effort: Pulmonary effort is normal.     Breath sounds: Normal breath sounds.  Musculoskeletal:        General: Normal range of motion.     Cervical back: Normal range of motion and neck supple.  Skin:    General: Skin is warm.  Neurological:     General: No focal deficit present.     Mental Status: He is alert and oriented to person, place, and time.  Psychiatric:        Attention and Perception: Attention normal.        Mood and Affect: Mood normal.        Speech: Speech normal.        Behavior: Behavior normal. Behavior is cooperative.        Thought Content: Thought content normal.        Cognition and Memory: Cognition normal.        Judgment: Judgment normal.        Depression  screen [facility] 2/9 07/07/2019 05/25/2019 05/11/2019  Decreased Interest 0 0 0  Down, Depressed, Hopeless 3 0 0  PHQ - 2 Score 3 0 0  Altered sleeping 3 - -  Tired, decreased energy 2 - -  Change in appetite 0 - -  Feeling bad or failure about yourself  2 - -  Trouble concentrating 0 - -  Moving slowly or fidgety/restless 0 - -  Suicidal thoughts 0 - -  PHQ-9 Score 10 - -     Assessment   1. Depression, major, single episode, moderate ([place])   2. Essential hypertension     Tests ordered No orders of the defined types were placed in this encounter.  Plan: Please see assessment and plan per problem list above.   Meds ordered this encounter  Medications  . buPROPion (WELLBUTRIN SR) 150 MG 12 hr tablet    Sig: Take 1 tablet (150 mg total) by mouth 2 (two) times daily.    Dispense:  30 tablet    Refill:  0    Order Specific Question:   Supervising Provider    Answer:   SIMPSON, MARGARET E [[id number]]  . benazepril (LOTENSIN) 40 MG tablet    Sig: Take 1 tablet (40 mg total) by mouth daily.    Dispense:  90 tablet    Refill:  1    Order Specific Question:   Supervising Provider    Answer:   SIMPSON, MARGARET E [[id number]]  . chlorthalidone (HYGROTON) 25 MG tablet    Sig: Take 1 tablet (25 mg total) by mouth daily.    Dispense:  30 tablet    Refill:  0    Order Specific Question:   Supervising Provider    Answer:   Fayrene Helper [[id number]]    Patient to follow-up in 3.5 weeks.  Perlie Mayo, NP

## 2019-07-07 NOTE — Assessment & Plan Note (Addendum)
Still demonstrating poor control. Reports taking medication daily. Continue Benazepril 40 mg and Norvasc 10 mg daily. Change HCTZ 12.5mg  to Chlorthalidone 25mg . Close follow up 3.5 week in office. Encourage to continue exercise, eating a well balanced diet and avoiding salt.  Reviewed side effects, risks and benefits of medication.   Patient acknowledged agreement and understanding of the plan

## 2019-07-07 NOTE — Assessment & Plan Note (Signed)
PHQ 10 today in office, Reports he feels down and depressed when he is isolated. Starting Wellbutrin 150 mg to see if this helps with depression and curb cravings as side effect.  Reviewed side effects, risks and benefits of medication.   Patient acknowledged agreement and understanding of the plan.

## 2019-07-07 NOTE — Patient Instructions (Addendum)
  I appreciate the opportunity to provide you with care for your health and wellness. Today we discussed: BP and depression  Follow up: 3.5 weeks  No labs or referrals today  Start taking Wellbutrin today for depression. Start taking new diuretic medication Chlorthalidone for BP.   Sit for 5 minutes with feet flat on the ground then take BP.  I hope you have a wonderful, happy, safe, and healthy Holiday Season! See you in the New Year :)  Please continue to practice social distancing to keep you, your family, and our community safe.  If you must go out, please wear a mask and practice good handwashing.  It was a pleasure to see you and I look forward to continuing to work together on your health and well-being. Please do not hesitate to call the office if you need care or have questions about your care.  Have a wonderful day and week. With Gratitude, Cherly Beach, DNP, AGNP-BC

## 2019-07-16 DIAGNOSIS — I10 Essential (primary) hypertension: Secondary | ICD-10-CM | POA: Diagnosis not present

## 2019-07-16 DIAGNOSIS — R42 Dizziness and giddiness: Secondary | ICD-10-CM | POA: Diagnosis not present

## 2019-07-21 ENCOUNTER — Other Ambulatory Visit: Payer: Self-pay

## 2019-07-21 DIAGNOSIS — Z79899 Other long term (current) drug therapy: Secondary | ICD-10-CM | POA: Diagnosis not present

## 2019-07-21 DIAGNOSIS — E119 Type 2 diabetes mellitus without complications: Secondary | ICD-10-CM | POA: Diagnosis not present

## 2019-07-21 DIAGNOSIS — I10 Essential (primary) hypertension: Secondary | ICD-10-CM | POA: Insufficient documentation

## 2019-07-21 DIAGNOSIS — Z7982 Long term (current) use of aspirin: Secondary | ICD-10-CM | POA: Insufficient documentation

## 2019-07-21 DIAGNOSIS — R42 Dizziness and giddiness: Secondary | ICD-10-CM | POA: Insufficient documentation

## 2019-07-21 DIAGNOSIS — R509 Fever, unspecified: Secondary | ICD-10-CM | POA: Insufficient documentation

## 2019-07-22 ENCOUNTER — Emergency Department (HOSPITAL_COMMUNITY)
Admission: EM | Admit: 2019-07-22 | Discharge: 2019-07-22 | Disposition: A | Discharge status: Home / Self Care | Payer: Medicare HMO | Attending: Emergency Medicine | Admitting: Emergency Medicine

## 2019-07-22 ENCOUNTER — Other Ambulatory Visit: Payer: Self-pay

## 2019-07-22 ENCOUNTER — Encounter (HOSPITAL_COMMUNITY): Payer: Self-pay

## 2019-07-22 ENCOUNTER — Emergency Department (HOSPITAL_COMMUNITY): Payer: Medicare HMO

## 2019-07-22 DIAGNOSIS — I10 Essential (primary) hypertension: Secondary | ICD-10-CM

## 2019-07-22 DIAGNOSIS — R42 Dizziness and giddiness: Secondary | ICD-10-CM | POA: Diagnosis not present

## 2019-07-22 LAB — COMPREHENSIVE METABOLIC PANEL
ALT: 24 U/L (ref 0–44)
AST: 28 U/L (ref 15–41)
Albumin: 3.8 g/dL (ref 3.5–5.0)
Alkaline Phosphatase: 80 U/L (ref 38–126)
Anion gap: 10 (ref 5–15)
BUN: 11 mg/dL (ref 8–23)
CO2: 23 mmol/L (ref 22–32)
Calcium: 8.4 mg/dL — ABNORMAL LOW (ref 8.9–10.3)
Chloride: 105 mmol/L (ref 98–111)
Creatinine, Ser: 1.15 mg/dL (ref 0.61–1.24)
GFR calc Af Amer: >60 mL/min (ref >60)
GFR calc non Af Amer: >60 mL/min (ref >60)
Glucose, Bld: 114 mg/dL — ABNORMAL HIGH (ref 70–99)
Potassium: 3.5 mmol/L (ref 3.5–5.1)
Sodium: 138 mmol/L (ref 135–145)
Total Bilirubin: 0.6 mg/dL (ref 0.3–1.2)
Total Protein: 7.3 g/dL (ref 6.5–8.1)

## 2019-07-22 LAB — CBC WITH DIFFERENTIAL/PLATELET
Abs Immature Granulocytes: 0.02 10*3/uL (ref 0.00–0.07)
Basophils Absolute: 0.1 10*3/uL (ref 0.0–0.1)
Basophils Relative: 1 %
Eosinophils Absolute: 0.2 10*3/uL (ref 0.0–0.5)
Eosinophils Relative: 3 %
HCT: 43 % (ref 39.0–52.0)
Hemoglobin: 14 g/dL (ref 13.0–17.0)
Immature Granulocytes: 0 %
Lymphocytes Relative: 26 %
Lymphs Abs: 1.9 10*3/uL (ref 0.7–4.0)
MCH: 30.8 pg (ref 26.0–34.0)
MCHC: 32.6 g/dL (ref 30.0–36.0)
MCV: 94.7 fL (ref 80.0–100.0)
Monocytes Absolute: 0.9 10*3/uL (ref 0.1–1.0)
Monocytes Relative: 12 %
Neutro Abs: 4.4 10*3/uL (ref 1.7–7.7)
Neutrophils Relative %: 58 %
Platelets: 289 10*3/uL (ref 150–400)
RBC: 4.54 MIL/uL (ref 4.22–5.81)
RDW: 12.5 % (ref 11.5–15.5)
WBC: 7.5 10*3/uL (ref 4.0–10.5)
nRBC: 0 % (ref 0.0–0.2)

## 2019-07-22 LAB — TROPONIN I (HIGH SENSITIVITY)
Troponin I (High Sensitivity): 25 ng/L — ABNORMAL HIGH (ref <18)
Troponin I (High Sensitivity): 26 ng/L — ABNORMAL HIGH (ref <18)

## 2019-07-22 MED ORDER — LISINOPRIL 10 MG PO TABS
10.0000 mg | ORAL_TABLET | Freq: Once | ORAL | Status: AC
  Administered 2019-07-22: 10 mg via ORAL
  Filled 2019-07-22: qty 1

## 2019-07-22 NOTE — ED Triage Notes (Signed)
Pt reports feeling "dizzy, like he was going to pass out for about a year and 1/2 now and nobody can figure out what is wrong." Pt says he got dizzy tonight when moving from sitting to standing, also with turning head quickly. Pt denies any other symptoms. Pt reports he has been seen at different doctors and had many different tests with no findings.

## 2019-07-22 NOTE — ED Provider Notes (Signed)
Emergency Department Provider Note   I have reviewed the triage vital signs and the nursing notes.   HISTORY  Chief Complaint Near Syncope (dizziness x 1 -1/2 years)   HPI Douglas Keller is a 79 y.o. male with medical history as documented below who presents to the emergency department today for repeat evaluation of dizziness.  Patient has had extensive work-ups in the past to include Holter monitors multiple times, MRIs, CT scans CT angiograms, and neurologic exams, cardiology exams all of which have been unrevealing.  Patient states that he will get lightheaded sometimes when he moves or stands up too quickly sometimes not.  States sometimes if he moves his head too much to left or right he will get some dizziness as well but no room spinning.  No recent illnesses.  Is been eating and drinking normally.  Urinating defecating normally.  Tonight he felt like he might pass out so he came here for further evaluation.  At this time is asymptomatic.  After noticing his blood pressure was high asked him if he was taking his medicines and he claims that he was. No chest pain. No change in urination. No sob. No new le edema.   No other associated or modifying symptoms.    Past Medical History:  Diagnosis Date  . Aortic valve sclerosis   . Atherosclerosis of coronary artery without angina pectoris 07/05/2014  . Diabetes mellitus type 2, diet-controlled ([place])   . HTN (hypertension) 07/06/2013  . Hyperlipidemia 07/06/2013    Patient Active Problem List   Diagnosis Date Noted  . Depression, major, single episode, moderate ([place]) 07/07/2019  . Obesity (BMI 30.0-34.9) 06/21/2019  . Elevated troponin   . Gastroesophageal reflux disease   . Dyslipidemia   . Chest pain 01/29/2019  . Type 2 diabetes mellitus with hyperlipidemia ([place]) 09/01/2018  . Heterozygous familial hypercholesterolemia 09/01/2018  . Hypercholesterolemia 08/21/2015  . Medication management 08/21/2015  . Atherosclerosis of  coronary artery without angina pectoris 07/05/2014  . Stiffness of left shoulder joint 07/19/2013  . Hyperlipidemia 07/06/2013  . HTN (hypertension) 07/06/2013  . Cervical spondylosis without myelopathy 02/13/2013  . Degeneration of cervical intervertebral disc 02/13/2013  . Spinal stenosis in cervical region 02/13/2013    Past Surgical History:  Procedure Laterality Date  . CERVICAL SPINE SURGERY    . COLONOSCOPY  07/26/2012   Procedure: COLONOSCOPY;  Surgeon: Jamesetta So, MD;  Location: [facility];  Service: Gastroenterology;  Laterality: N/A;  . KNEE ARTHROSCOPY      Current Outpatient Rx  . Order #: [id number] Class: Normal  . Order #: [id number] Class: Historical Med  . Order #: [id number] Class: Normal  . Order #: [id number] Class: Historical Med  . Order #: [id number] Class: Normal  . Order #: [id number] Class: Print  . Order #: [id number] Class: Normal  . Order #: [id number] Class: Normal  . Order #: [id number] Class: Normal  . Order #: [id number] Class: Normal  . Order #: [id number] Class: Historical Med  . Order #: [id number] Class: Normal  . Order #: [id number] Class: Normal  . Order #: [id number] Class: Historical Med  . Order #: [id number] Class: Normal  . Order #: [id number] Class: Historical Med    Allergies Crestor [rosuvastatin]  Family History  Problem Relation Age of Onset  . Asthma Mother   . Hypertension Father   . Colon cancer Neg Hx     Social History Social History   Tobacco Use  . Smoking status: Never Smoker  . Smokeless tobacco: Never Used  Substance Use Topics  . Alcohol use: No  .  Drug use: No    Review of Systems  All other systems negative except as documented in the HPI. All pertinent positives and negatives as reviewed in the HPI. ____________________________________________   PHYSICAL EXAM:  VITAL SIGNS: ED Triage Vitals  Enc Vitals Group     BP 07/22/19 0015 (!) 191/111     Pulse Rate 07/22/19 0015 77     Resp 07/22/19 0015 17     Temp  07/22/19 0015 98.2 F (36.8 C)     Temp Source 07/22/19 0015 Oral     SpO2 07/22/19 0015 99 %     Weight 07/22/19 0017 200 lb (90.7 kg)     Height 07/22/19 0017 5\' 6"  (1.676 m)    Constitutional: Alert and oriented. Well appearing and in no acute distress. Eyes: Conjunctivae are normal. PERRL. EOMI. Head: Atraumatic. Nose: No congestion/rhinnorhea. Mouth/Throat: Mucous membranes are moist.  Oropharynx non-erythematous. Neck: No stridor.  No meningeal signs.   Cardiovascular: Normal rate, regular rhythm. Good peripheral circulation. Grossly normal heart sounds.   Respiratory: Normal respiratory effort.  No retractions. Lungs CTAB. Gastrointestinal: Soft and nontender. No distention.  Musculoskeletal: No lower extremity tenderness nor edema. No gross deformities of extremities. Neurologic:  No altered mental status, able to give full seemingly accurate history.  Face is symmetric, EOM's intact, pupils equal and reactive, vision intact, tongue and uvula midline without deviation. Upper and Lower extremity motor 5/5, intact pain perception in distal extremities, 2+ reflexes in biceps, patella and achilles tendons. Able to perform finger to nose normal with both hands. Walks without assistance or evident ataxia.   Skin:  Skin is warm, dry and intact. No rash noted.   ____________________________________________   LABS (all labs ordered are listed, but only abnormal results are displayed)  Labs Reviewed  COMPREHENSIVE METABOLIC PANEL - Abnormal; Notable for the following components:      Result Value   Glucose, Bld 114 (*)    Calcium 8.4 (*)    All other components within normal limits  TROPONIN I (HIGH SENSITIVITY) - Abnormal; Notable for the following components:   Troponin I (High Sensitivity) 25 (*)    All other components within normal limits  TROPONIN I (HIGH SENSITIVITY) - Abnormal; Notable for the following components:   Troponin I (High Sensitivity) 26 (*)    All other  components within normal limits  CBC WITH DIFFERENTIAL/PLATELET   ____________________________________________   RADIOLOGY  DG Chest Portable 1 View  Result Date: 07/22/2019 CLINICAL DATA:  Initial evaluation for acute dizziness. EXAM: PORTABLE CHEST 1 VIEW COMPARISON:  Prior radiograph from 09/13/2016. FINDINGS: The cardiac and mediastinal silhouettes are stable in size and contour, and remain within normal limits. The lungs are normally inflated. No airspace consolidation, pleural effusion, or pulmonary edema. No pneumothorax. No acute osseous abnormality. IMPRESSION: No active cardiopulmonary disease. Electronically Signed   By: Jeannine Boga M.D.   On: 07/22/2019 00:56    ____________________________________________   PROCEDURES  Procedure(s) performed:   Procedures   ____________________________________________   INITIAL IMPRESSION / ASSESSMENT AND PLAN / ED COURSE  Patient with multiple extensive appropriate work-ups for this dizziness without found cause.  Nothing seems that much different about now.  He is hypertensive again an extra dose of his home medication and this did seem to improve it.  This may be what is causing it?  His troponins are slightly positive but similar to previous and unchanging while he was here.  Patient can continue to get outpatient work-up low suspicion  for emergent causes such as stroke, heart attack, bradycardia, arrhythmia at this time  Pertinent labs & imaging results that were available during my care of the patient were reviewed by me and considered in my medical decision making (see chart for details).  A medical screening exam was performed and I feel the patient has had an appropriate workup for their chief complaint at this time and likelihood of emergent condition existing is low. They have been counseled on decision, discharge, follow up and which symptoms necessitate immediate return to the emergency department. They or their  family verbally stated understanding and agreement with plan and discharged in stable condition.   ____________________________________________  FINAL CLINICAL IMPRESSION(S) / ED DIAGNOSES  Final diagnoses:  Dizziness  Hypertension, unspecified type    MEDICATIONS GIVEN DURING THIS VISIT:  Medications  lisinopril (ZESTRIL) tablet 10 mg (10 mg Oral Given 07/22/19 0030)     NEW OUTPATIENT MEDICATIONS STARTED DURING THIS VISIT:  Discharge Medication List as of 07/22/2019  3:15 AM      Note:  This note was prepared with assistance of Dragon voice recognition software. Occasional wrong-word or sound-a-like substitutions may have occurred due to the inherent limitations of voice recognition software.   Barbra Miner, Corene Cornea, MD 07/22/19 [phone]

## 2019-08-01 ENCOUNTER — Other Ambulatory Visit: Payer: Self-pay

## 2019-08-01 ENCOUNTER — Ambulatory Visit (INDEPENDENT_AMBULATORY_CARE_PROVIDER_SITE_OTHER): Payer: Medicare HMO | Admitting: Family Medicine

## 2019-08-01 ENCOUNTER — Encounter: Payer: Self-pay | Admitting: Family Medicine

## 2019-08-01 ENCOUNTER — Encounter (INDEPENDENT_AMBULATORY_CARE_PROVIDER_SITE_OTHER): Payer: Self-pay

## 2019-08-01 VITALS — BP 140/82 | HR 100 | Temp 98.6°F | Resp 15 | Ht 66.0 in | Wt 209.0 lb

## 2019-08-01 DIAGNOSIS — I10 Essential (primary) hypertension: Secondary | ICD-10-CM

## 2019-08-01 DIAGNOSIS — E669 Obesity, unspecified: Secondary | ICD-10-CM | POA: Diagnosis not present

## 2019-08-01 DIAGNOSIS — R42 Dizziness and giddiness: Secondary | ICD-10-CM | POA: Diagnosis not present

## 2019-08-01 DIAGNOSIS — F321 Major depressive disorder, single episode, moderate: Secondary | ICD-10-CM | POA: Diagnosis not present

## 2019-08-01 MED ORDER — BUPROPION HCL ER (SR) 150 MG PO TB12: 150.0000 mg | ORAL_TABLET | Freq: Every day | ORAL | 1 refills | Status: DC

## 2019-08-01 MED ORDER — MECLIZINE HCL 12.5 MG PO TABS: 12.5000 mg | ORAL_TABLET | Freq: Two times a day (BID) | ORAL | 0 refills | Status: DC | PRN

## 2019-08-01 NOTE — Assessment & Plan Note (Signed)
Still demonstrating poor control.  He was taking his medications daily at the last visit but reports that the chlorthalidone made him have cramps.  So he has stopped this.  Did not tolerate the hydrochlorothiazide either.  Is on the benazepril 40 mg and Norvasc 10 mg daily.  Today he has somewhat demonstrated control in the office 140/82; but reports that the lowest he is ever seen at home 1 time was 136/73.  He usually sees it in the 160/90 range.  We have tried various medications there are still some that we could look at investigating comparatively clonidine 0.1 once a day, hydralazine once or twice a day as well. He is still encouraged to make sure that he exercises, eats a well-balanced diet and avoid salt.  Reports that he does not think that anxiety or stress are contributors but he is unsure. He does have upcoming cardiology appointment appreciate collaboration in his care. Patient acknowledged agreement and understanding of the plan.

## 2019-08-01 NOTE — Assessment & Plan Note (Signed)
Ongoing dizzy spells unsure if they are related to sinuses has been assessed by ENT there was something found but he has not followed up with them he also reports that he is used meclizine in the past and that was very helpful for him.  Will do low-dose meclizine he is aware that it can put him at higher risk for falls.  He verbalized understanding of this and would still like to proceed with having it on days that he has really bad dizziness.  Possible dizziness could be related to psychosomatic changes related to significant depression.  As he has had multiple trips to the emergency room outside of elevated blood pressure which we have not found any correlation to dizziness we do not know exactly why he has the dizziness outside of possible diagnosis of vertigo. Patient acknowledged agreement and understanding of the plan.   Reviewed side effects, risks and benefits of medication.

## 2019-08-01 NOTE — Patient Instructions (Addendum)
Happy New Year! May you have a year filled with hope, love, happiness and laughter.  I appreciate the opportunity to provide you with care for your health and wellness. Today we discussed: blood pressure, dizziness   Follow up: 4 months   No labs or referrals today  Start taking the Meclizine only when needed for dizziness.   Wellbutrin is once daily   I will review your medications again and see if there is anything we have not tried to for BP.  Please continue to practice social distancing to keep you, your family, and our community safe.  If you must go out, please wear a mask and practice good handwashing.  It was a pleasure to see you and I look forward to continuing to work together on your health and well-being. Please do not hesitate to call the office if you need care or have questions about your care.  Have a wonderful day and week. With Gratitude, Cherly Beach, DNP, AGNP-BC

## 2019-08-01 NOTE — Assessment & Plan Note (Signed)
Educated on the importance of making sure that he eats a well-balanced diet.  Weight has bounced around 3 to 4 pounds over the last several months.  He reports that he tries to avoid salt and eat a well-balanced diet and avoid high fat meals.

## 2019-08-01 NOTE — Progress Notes (Signed)
Subjective:  [name], male    DOB: [birth year]  Age: 79 y.o. MRN: [id number]  CC:  Chief Complaint  Patient presents with  . Hypertension    bp evaluation      HPI  Hypertension This is a chronic problem. The current episode started more than 1 year ago. The problem has been waxing and waning since onset. The problem is uncontrolled. There are no associated agents to hypertension. Risk factors for coronary artery disease include dyslipidemia, obesity, male gender and sedentary lifestyle. Past treatments include calcium channel blockers, ACE inhibitors, diuretics and lifestyle changes. The current treatment provides mild improvement. Compliance problems include exercise, diet and medication side effects.  Hypertensive end-organ damage includes CAD/MI.  Here for follow-up of hypertension. Is not exercising, reports trying to watch his diet. BP at home is not well controlled, but he does see some control. Lowest 136/72; average per him is 160/90  Cardiac symptoms: none. Patient denies: chest pain, chest pressure/discomfort, claudication, dyspnea, exertional chest pressure/discomfort, fatigue, irregular heart beat, lower extremity edema, near-syncope, orthopnea, palpitations, paroxysmal nocturnal dyspnea, syncope and tachypnea.  Reports taking all medications as directed and denies side effects.  Reports Wellbutrin is helpful for him and his depression, would like to continue this.   Today patient denies signs and symptoms of COVID 19 infection including fever, chills, cough, shortness of breath, and headache. Past Medical, Surgical, Social History, Allergies, and Medications have been Reviewed.   Past Medical History:  Diagnosis Date  . Aortic valve sclerosis   . Atherosclerosis of coronary artery without angina pectoris 07/05/2014  . Cervical spondylosis without myelopathy 02/13/2013  . Chest pain 01/29/2019  . Degeneration of cervical intervertebral disc 02/13/2013   . Diabetes mellitus type 2, diet-controlled ([place])   . Elevated troponin   . HTN (hypertension) 07/06/2013  . Hyperlipidemia 07/06/2013    Current Meds  Medication Sig  . Accu-Chek Softclix Lancets lancets Use to test blood sugar once daily  . acetaminophen (TYLENOL) 650 MG CR tablet Take 1,300 mg by mouth 2 (two) times daily.  Marland Kitchen amLODipine (NORVASC) 10 MG tablet Take 1 tablet (10 mg total) by mouth daily.  Marland Kitchen aspirin EC 81 MG tablet Take 81 mg by mouth daily.  . benazepril (LOTENSIN) 40 MG tablet Take 1 tablet (40 mg total) by mouth daily.  . blood glucose meter kit and supplies Dispense based on patient and insurance preference. Use to test blood sugar once daily. (FOR ICD-10 E10.9, E11.9).  Marland Kitchen buPROPion (WELLBUTRIN SR) 150 MG 12 hr tablet Take 1 tablet (150 mg total) by mouth daily.  . Evolocumab (REPATHA SURECLICK) 867 MG/ML SOAJ Inject 140 mg into the skin every 14 (fourteen) days.  Marland Kitchen gabapentin (NEURONTIN) 300 MG capsule Take 1 capsule (300 mg total) by mouth 3 (three) times daily.  Marland Kitchen GLUCOSAMINE PO Take 1 tablet by mouth at bedtime.  Marland Kitchen glucose blood test strip Use to test blood sugar once daily  . KLOR-CON M20 20 MEQ tablet Take 1 tablet (20 mEq total) by mouth daily.  . Multiple Vitamins-Minerals (AIRBORNE PO) Take 1 each by mouth daily.  . pantoprazole (PROTONIX) 40 MG tablet Take 1 tablet (40 mg total) by mouth daily.  Marland Kitchen SHINGRIX injection   . [DISCONTINUED] buPROPion (WELLBUTRIN SR) 150 MG 12 hr tablet Take 1 tablet (150 mg total) by mouth 2 (two) times daily.  . [DISCONTINUED] chlorthalidone (HYGROTON) 25 MG tablet Take 1 tablet (25 mg total) by mouth daily.  ROS:  Review of Systems  Constitutional: Negative.   HENT: Negative.   Eyes: Negative.   Respiratory: Negative.   Cardiovascular: Negative.   Gastrointestinal: Negative.   Genitourinary: Negative.   Musculoskeletal: Negative.   Skin: Negative.   Neurological: Negative.   Endo/Heme/Allergies: Negative.     Psychiatric/Behavioral: Negative.   All other systems reviewed and are negative.    Objective:   Today's Vitals: BP 140/82   Pulse 100   Temp 98.6 F (37 C) (Oral)   Resp 15   Ht 5' 6"  (1.676 m)   Wt 209 lb 0.6 oz (94.8 kg)   SpO2 97%   BMI 33.74 kg/m  Vitals with BMI 08/01/2019 07/22/2019 07/22/2019  Height 5' 6"  - -  Weight 209 lbs 1 oz - -  BMI 69.67 - -  Systolic 893 810 175  Diastolic 82 85 87  Pulse 102 58 69     Physical Exam Vitals and nursing note reviewed.  Constitutional:      Appearance: Normal appearance. He is well-developed and well-groomed. He is obese.  HENT:     Head: Normocephalic and atraumatic.     Right Ear: External ear normal.     Left Ear: External ear normal.     Nose: Nose normal.     Mouth/Throat:     Mouth: Mucous membranes are moist.     Pharynx: Oropharynx is clear.  Eyes:     General:        Right eye: No discharge.        Left eye: No discharge.     Conjunctiva/sclera: Conjunctivae normal.  Cardiovascular:     Rate and Rhythm: Normal rate and regular rhythm.     Pulses: Normal pulses.     Heart sounds: Normal heart sounds.  Pulmonary:     Effort: Pulmonary effort is normal.     Breath sounds: Normal breath sounds.  Musculoskeletal:        General: Normal range of motion.     Cervical back: Normal range of motion and neck supple.  Skin:    General: Skin is warm.  Neurological:     General: No focal deficit present.     Mental Status: He is alert and oriented to person, place, and time.  Psychiatric:        Attention and Perception: Attention normal.        Mood and Affect: Mood normal.        Speech: Speech normal.        Behavior: Behavior normal. Behavior is cooperative.        Thought Content: Thought content normal.        Cognition and Memory: Cognition normal.        Judgment: Judgment normal.    Depression screen [facility] 2/9 08/01/2019 07/07/2019 05/25/2019  Decreased Interest 0 0 0  Down, Depressed, Hopeless 0 3 0   PHQ - 2 Score 0 3 0  Altered sleeping - 3 -  Tired, decreased energy - 2 -  Change in appetite - 0 -  Feeling bad or failure about yourself  - 2 -  Trouble concentrating - 0 -  Moving slowly or fidgety/restless - 0 -  Suicidal thoughts - 0 -  PHQ-9 Score - 10 -    Assessment   1. Depression, major, single episode, moderate ([place])   2. Essential hypertension   3. Obesity (BMI 30.0-34.9)   4. Dizzy spells     Tests ordered No orders of  the defined types were placed in this encounter.    Plan: Please see assessment and plan per problem list above.   Meds ordered this encounter  Medications  . buPROPion (WELLBUTRIN SR) 150 MG 12 hr tablet    Sig: Take 1 tablet (150 mg total) by mouth daily.    Dispense:  30 tablet    Refill:  1    Order Specific Question:   Supervising Provider    Answer:   SIMPSON, MARGARET E [[id number]]  . meclizine (ANTIVERT) 12.5 MG tablet    Sig: Take 1 tablet (12.5 mg total) by mouth 2 (two) times daily as needed for dizziness.    Dispense:  30 tablet    Refill:  0    Order Specific Question:   Supervising Provider    Answer:   Fayrene Helper [[id number]]    Patient to follow-up in 4 month   Perlie Mayo, NP

## 2019-08-01 NOTE — Assessment & Plan Note (Signed)
Office Visit from 08/01/2019 in [facility]  PHQ-2 Total Score  0     Improved, reported Wellbutrin was good.  Will be starting this daily. Past Medical, Surgical, Social History, Allergies, and Medications have been Reviewed. Patient acknowledged agreement and understanding of the plan.

## 2019-08-15 ENCOUNTER — Telehealth: Payer: Self-pay | Admitting: *Deleted

## 2019-08-15 ENCOUNTER — Other Ambulatory Visit: Payer: Self-pay

## 2019-08-15 ENCOUNTER — Encounter (HOSPITAL_COMMUNITY): Payer: Self-pay | Admitting: *Deleted

## 2019-08-15 ENCOUNTER — Emergency Department (HOSPITAL_COMMUNITY)
Admission: EM | Admit: 2019-08-15 | Discharge: 2019-08-15 | Disposition: A | Discharge status: Home / Self Care | Payer: Medicare HMO | Attending: Emergency Medicine | Admitting: Emergency Medicine

## 2019-08-15 DIAGNOSIS — Z7982 Long term (current) use of aspirin: Secondary | ICD-10-CM | POA: Insufficient documentation

## 2019-08-15 DIAGNOSIS — Z79899 Other long term (current) drug therapy: Secondary | ICD-10-CM | POA: Diagnosis not present

## 2019-08-15 DIAGNOSIS — R42 Dizziness and giddiness: Secondary | ICD-10-CM | POA: Diagnosis not present

## 2019-08-15 DIAGNOSIS — I10 Essential (primary) hypertension: Secondary | ICD-10-CM | POA: Insufficient documentation

## 2019-08-15 DIAGNOSIS — M79604 Pain in right leg: Secondary | ICD-10-CM | POA: Diagnosis not present

## 2019-08-15 DIAGNOSIS — E119 Type 2 diabetes mellitus without complications: Secondary | ICD-10-CM | POA: Diagnosis not present

## 2019-08-15 DIAGNOSIS — I2510 Atherosclerotic heart disease of native coronary artery without angina pectoris: Secondary | ICD-10-CM | POA: Diagnosis not present

## 2019-08-15 NOTE — Discharge Instructions (Addendum)
Please address your concerns about your prior cervical spine surgery and your dizziness with your primary care or with Dr. Arnoldo Morale, the neurosurgeon.  They can order your outpatient MRI of your cervical spine and follow-up with that for you.

## 2019-08-15 NOTE — Telephone Encounter (Signed)
Advised patient he would have to have an appt to be evaluated if he wanted to have an MRI. I asked him if he had been seen by neurosurgery and he stated that they wanted him to have a procedure done that would have "messed up his kidneys" so he didn't have it done. I advised patient that this was the best option because he has been referred to them for neck pain. I offered him an earlier appt or asked him if he wanted to keep the appt he already had scheduled. He stated he would just keep the appointment he already had scheduled.

## 2019-08-15 NOTE — Telephone Encounter (Signed)
Pt wants to know if Douglas Keller will order him an MRI he was in the Er yesterday for pain in his neck. Pt stated he would like to get it done before his appt 08-25-19 with Douglas Keller so she can go over it with him

## 2019-08-15 NOTE — ED Provider Notes (Signed)
[facility] EMERGENCY DEPARTMENT Provider Note   CSN: [id number] Arrival date & time: 08/15/19  0116   Time seen 1:45 AM  History Chief Complaint  Patient presents with  . Dizziness    Douglas Keller is a 79 y.o. male.  HPI Patient states about 45 minutes prior to arrival he felt hot all over and his right leg was burning and cramping.  He states this lasted for about 20 minutes.  He drove himself to the ED and on the way he had some mild dizziness which she states was a feeling of being off balance.  He states he has had these episodes in the past and sometimes they were followed by fainting episodes.  He has had these for the past 3 to 4 years.  He states he had some right leg cramping now but the feeling of being hot that he had in his whole body is gone and the heat in his leg is better.  He states he has been having ringing in his right ear off and on "all the time".  He denies chest pain, shortness of breath, he had some mild nausea without vomiting, he denies weakness in his extremities, he denies headache, he states he felt like his vision was blurred.  He states sometimes moving his head makes the dizziness worse.  He has been tried on meclizine which she states does not help.  He states he has chronic neck and back pain and had neck surgery by Dr. Carloyn Manner.  He states he still has pain in the right side of his neck that goes into his head.  When asked if he had gone back to Dr. Carloyn Manner about that he said he retired.  However when I review his chart he was scheduled to have a CT of his lumbar spine and myelogram done on July 25, 2018 by Dr. Ronnald Ramp, neurosurgery which evidently he did not have done.  Patient states he is right-handed.  He states he has seen a cardiologist and wore a monitor for 30 days.  He states he had no episodes during that time but then later he told me he has daily episodes.  He has also been evaluated by ENT and was sent to a specialist ENT and did therapy without  improvement.  He states he has been seen by [facility] twice.  He also has been seen by a neurologist.  Patient states his blood pressures typically 165/91.  He is seen by cardiologist, Dr Sallyanne Kuster.  PCP Perlie Mayo, NP     Past Medical History:  Diagnosis Date  . Aortic valve sclerosis   . Atherosclerosis of coronary artery without angina pectoris 07/05/2014  . Cervical spondylosis without myelopathy 02/13/2013  . Chest pain 01/29/2019  . Degeneration of cervical intervertebral disc 02/13/2013  . Diabetes mellitus type 2, diet-controlled ([place])   . Elevated troponin   . HTN (hypertension) 07/06/2013  . Hyperlipidemia 07/06/2013    Patient Active Problem List   Diagnosis Date Noted  . Dizzy spells 08/01/2019  . Depression, major, single episode, moderate ([place]) 07/07/2019  . Obesity (BMI 30.0-34.9) 06/21/2019  . Gastroesophageal reflux disease   . Type 2 diabetes mellitus with hyperlipidemia ([place]) 09/01/2018  . Heterozygous familial hypercholesterolemia 09/01/2018  . Hypercholesterolemia 08/21/2015  . Medication management 08/21/2015  . Atherosclerosis of coronary artery without angina pectoris 07/05/2014  . Stiffness of left shoulder joint 07/19/2013  . Hyperlipidemia 07/06/2013  . HTN (hypertension) 07/06/2013    Past Surgical History:  Procedure Laterality Date  . CERVICAL SPINE SURGERY    . COLONOSCOPY  07/26/2012   Procedure: COLONOSCOPY;  Surgeon: Jamesetta So, MD;  Location: [facility];  Service: Gastroenterology;  Laterality: N/A;  . KNEE ARTHROSCOPY         Family History  Problem Relation Age of Onset  . Asthma Mother   . Hypertension Father   . Colon cancer Neg Hx     Social History   Tobacco Use  . Smoking status: Never Smoker  . Smokeless tobacco: Never Used  Substance Use Topics  . Alcohol use: No  . Drug use: No  lives at home  Home Medications Prior to Admission medications   Medication Sig Start Date End Date Taking? Authorizing  Provider  Accu-Chek Softclix Lancets lancets Use to test blood sugar once daily 06/28/19   Perlie Mayo, NP  acetaminophen (TYLENOL) 650 MG CR tablet Take 1,300 mg by mouth 2 (two) times daily.    [provider]  Alcohol Swabs (B-D SINGLE USE SWABS REGULAR) PADS  06/26/19   [provider]  amLODipine (NORVASC) 10 MG tablet Take 1 tablet (10 mg total) by mouth daily. 05/25/19   Perlie Mayo, NP  aspirin EC 81 MG tablet Take 81 mg by mouth daily.    [provider]  benazepril (LOTENSIN) 40 MG tablet Take 1 tablet (40 mg total) by mouth daily. 07/07/19   Perlie Mayo, NP  Blood Glucose Calibration (ACCU-CHEK AVIVA) SOLN  06/26/19   [provider]  blood glucose meter kit and supplies Dispense based on patient and insurance preference. Use to test blood sugar once daily. (FOR ICD-10 E10.9, E11.9). 06/22/19   Perlie Mayo, NP  buPROPion [facility] SR) 150 MG 12 hr tablet Take 1 tablet (150 mg total) by mouth daily. 08/01/19   Perlie Mayo, NP  Evolocumab (REPATHA SURECLICK) 623 MG/ML SOAJ Inject 140 mg into the skin every 14 (fourteen) days. 06/22/19   Croitoru, Mihai, MD  gabapentin (NEURONTIN) 300 MG capsule Take 1 capsule (300 mg total) by mouth 3 (three) times daily. 06/05/19   Perlie Mayo, NP  GLUCOSAMINE PO Take 1 tablet by mouth at bedtime.    [provider]  glucose blood test strip Use to test blood sugar once daily 06/28/19   Perlie Mayo, NP  KLOR-CON M20 20 MEQ tablet Take 1 tablet (20 mEq total) by mouth daily. 06/01/19   Perlie Mayo, NP  meclizine (ANTIVERT) 12.5 MG tablet Take 1 tablet (12.5 mg total) by mouth 2 (two) times daily as needed for dizziness. 08/01/19   Perlie Mayo, NP  Multiple Vitamins-Minerals (AIRBORNE PO) Take 1 each by mouth daily.    [provider]  pantoprazole (PROTONIX) 40 MG tablet Take 1 tablet (40 mg total) by mouth daily. 05/25/19   Perlie Mayo, NP  [facility] injection  02/03/19    [provider]    Allergies    Crestor [rosuvastatin] and Statins  Review of Systems   Review of Systems  All other systems reviewed and are negative.   Physical Exam Updated Vital Signs BP (!) 149/78   Pulse 62   Temp 99.1 F (37.3 C) (Oral)   Resp 18   Ht 5' 6"  (1.676 m)   Wt 90.7 kg   SpO2 94%   BMI 32.28 kg/m   Physical Exam Vitals and nursing note reviewed.  Constitutional:      General: He is not  in acute distress.    Appearance: Normal appearance. He is well-developed. He is not ill-appearing or toxic-appearing.  HENT:     Head: Normocephalic and atraumatic.     Right Ear: External ear normal.     Left Ear: External ear normal.     Nose: Nose normal. No mucosal edema or rhinorrhea.     Mouth/Throat:     Mouth: Mucous membranes are moist.     Dentition: No dental abscesses.     Pharynx: Oropharynx is clear. No uvula swelling.  Eyes:     Extraocular Movements: Extraocular movements intact.     Conjunctiva/sclera: Conjunctivae normal.     Pupils: Pupils are equal, round, and reactive to light.     Comments: No nystagmus  Neck:     Vascular: No carotid bruit.  Cardiovascular:     Rate and Rhythm: Normal rate and regular rhythm.     Heart sounds: Normal heart sounds. No murmur. No friction rub. No gallop.   Pulmonary:     Effort: Pulmonary effort is normal. No respiratory distress.     Breath sounds: Normal breath sounds. No wheezing, rhonchi or rales.  Chest:     Chest wall: No tenderness or crepitus.  Musculoskeletal:        General: No tenderness. Normal range of motion.     Cervical back: Full passive range of motion without pain, normal range of motion and neck supple.     Comments: Moves all extremities well.   Skin:    General: Skin is warm and dry.     Coloration: Skin is not pale.     Findings: No erythema or rash.  Neurological:     General: No focal deficit present.     Mental Status: He is alert and oriented to person, place, and  time.     Cranial Nerves: No cranial nerve deficit.     Comments: No pronator drift, no weakness of his extremities, grips are equal.  Psychiatric:        Mood and Affect: Mood normal. Mood is not anxious.        Speech: Speech normal.        Behavior: Behavior normal.        Thought Content: Thought content normal.     ED Results / Procedures / Treatments   Labs (all labs ordered are listed, but only abnormal results are displayed) Labs Reviewed - No data to display  EKG EKG Interpretation  Date/Time:  Tuesday August 15 2019 01:33:28 EST Ventricular Rate:  71 PR Interval:    QRS Duration: 105 QT Interval:  415 QTC Calculation: 451 R Axis:   -13 Text Interpretation: Sinus rhythm Abnormal R-wave progression, late transition No significant change since last tracing 29 Jan 2019 Confirmed by Rolland Porter [phone]) on 08/15/2019 1:35:58 AM   Radiology No results found.   Ultrasound arterial ABI bilateral September 06, 2018 IMPRESSION: Resting ABI of the bilateral lower extremities within normal limits.  Segmental exam demonstrates no evidence of significant arterial occlusive disease  Signed,  Dulcy Fanny. Dellia Nims, RPVI  [facility]  [facility]   Electronically Signed   By: Corrie Mckusick D.O.   On: 09/06/2018 11:15  C-spine x-rays January 29, 2019 IMPRESSION: 1. No fracture, bone lesion or spondylolisthesis. 2. Well-positioned orthopedic hardware from the previous posterior fusion, C3 through C7. No evidence of loosening of the orthopedic hardware.   Electronically Signed   By: Dedra Skeens.D.  On: 01/29/2019 06:34  Nuclear medicine myocardial scan January 30, 2019  There was no ST segment deviation noted during stress.  This is a low risk study.  Nuclear stress EF: 47%.  Defect 1: There is a medium defect of mild severity present in the basal inferior and mid inferior location. This is likely due to  soft tissue attenuation.  No large ischemic territories.  MR brain without contrast May 13, 2018  IMPRESSION: No acute intracranial finding. Atrophy with parietal prominence, right more than left. Minimal small vessel change of the cerebral hemispheric white matter.  Acute right maxillary sinusitis which could be symptomatic.   Electronically Signed   By: Nelson Chimes M.D.   On: 05/13/2018 13:16  CT angio neck and brain, CT head without contrast February 18, 2018 IMPRESSION: CT HEAD:  1. Normal CT HEAD with and without contrast for age.  CTA NECK:  1. No hemodynamically significant stenosis ICA's. Patent vertebral arteries. 2. Severe C3-4 through C5-6 neural foraminal narrowing.  CTA HEAD:  1. No emergent large vessel occlusion or flow-limiting stenosis. 2. Mild ICA stenosis.  Aortic Atherosclerosis (ICD10-I70.0).   Electronically Signed   By: Elon Alas M.D.   On: 02/18/2018 01:31   Procedures Procedures (including critical care time)  Medications Ordered in ED Medications - No data to display  ED Course  I have reviewed the triage vital signs and the nursing notes.  Pertinent labs & imaging results that were available during my care of the patient were reviewed by me and considered in my medical decision making (see chart for details).    MDM Rules/Calculators/A&P                      When I review care everywhere patient was seen by neurology at [facility] in August, October and November 2019.  He told them he has been having the dizziness since he had cervical spine surgery in 2014.  They used a posterior approach.  They reviewed the MRI of the brain that he had had done in our system.  Tonight patient is requesting an MRI of his cervical spine.  I do not have MRI at night, I feel like this is something his primary care doctor should order.  There is nothing new or acute about his symptoms tonight.  I also saw that he had a CT lumbar spine  and myelogram ordered a year ago in January that he did not have done.  He was seen by Dr. Arnoldo Morale at that time.  I feel like he can go to his primary care doctor which he coincidentally has an appointment with next week or see Dr. Arnoldo Morale to discuss this MRI of his cervical spine.  Patient is feeling better when I rechecked him at 5:30 AM.  He is going to be discharged home.   Final Clinical Impression(s) / ED Diagnoses Final diagnoses:  Dizziness    Rx / DC Orders ED Discharge Orders    None      Plan discharge  Rolland Porter, MD, Barbette Or, MD 08/15/19 [phone]

## 2019-08-15 NOTE — ED Triage Notes (Signed)
Pt c/o feeling dizzy tonight while lying in bed; pt states he had a hot feeling and felt like he was going to pass out; pt states he has had some of the same episodes in the past and they are unable to figure out what is causing the dizziness; pt states tonight while lying in bed he had pain and numbness to his right leg but the pain and numbness went away right before he arrived at the hospital

## 2019-08-16 ENCOUNTER — Other Ambulatory Visit: Payer: Self-pay

## 2019-08-16 ENCOUNTER — Encounter (HOSPITAL_COMMUNITY): Payer: Self-pay | Admitting: *Deleted

## 2019-08-16 DIAGNOSIS — I10 Essential (primary) hypertension: Secondary | ICD-10-CM | POA: Insufficient documentation

## 2019-08-16 DIAGNOSIS — Z7982 Long term (current) use of aspirin: Secondary | ICD-10-CM | POA: Insufficient documentation

## 2019-08-16 DIAGNOSIS — E119 Type 2 diabetes mellitus without complications: Secondary | ICD-10-CM | POA: Insufficient documentation

## 2019-08-16 DIAGNOSIS — Z7984 Long term (current) use of oral hypoglycemic drugs: Secondary | ICD-10-CM | POA: Insufficient documentation

## 2019-08-16 DIAGNOSIS — Z79899 Other long term (current) drug therapy: Secondary | ICD-10-CM | POA: Diagnosis not present

## 2019-08-16 DIAGNOSIS — K625 Hemorrhage of anus and rectum: Secondary | ICD-10-CM | POA: Diagnosis not present

## 2019-08-16 DIAGNOSIS — I2510 Atherosclerotic heart disease of native coronary artery without angina pectoris: Secondary | ICD-10-CM | POA: Diagnosis not present

## 2019-08-16 NOTE — ED Triage Notes (Signed)
Pt states he had bright red blood in the shower tonight that was coming from his rectum; pt denies any pain

## 2019-08-17 ENCOUNTER — Emergency Department (HOSPITAL_COMMUNITY)
Admission: EM | Admit: 2019-08-17 | Discharge: 2019-08-17 | Disposition: A | Discharge status: Home / Self Care | Payer: Medicare HMO | Attending: Emergency Medicine | Admitting: Emergency Medicine

## 2019-08-17 DIAGNOSIS — K625 Hemorrhage of anus and rectum: Secondary | ICD-10-CM

## 2019-08-17 LAB — COMPREHENSIVE METABOLIC PANEL
ALT: 24 U/L (ref 0–44)
AST: 26 U/L (ref 15–41)
Albumin: 3.9 g/dL (ref 3.5–5.0)
Alkaline Phosphatase: 73 U/L (ref 38–126)
Anion gap: 9 (ref 5–15)
BUN: 15 mg/dL (ref 8–23)
CO2: 25 mmol/L (ref 22–32)
Calcium: 8.6 mg/dL — ABNORMAL LOW (ref 8.9–10.3)
Chloride: 108 mmol/L (ref 98–111)
Creatinine, Ser: 1.2 mg/dL (ref 0.61–1.24)
GFR calc Af Amer: >60 mL/min (ref >60)
GFR calc non Af Amer: 58 mL/min — ABNORMAL LOW (ref >60)
Glucose, Bld: 126 mg/dL — ABNORMAL HIGH (ref 70–99)
Potassium: 3.6 mmol/L (ref 3.5–5.1)
Sodium: 142 mmol/L (ref 135–145)
Total Bilirubin: 0.6 mg/dL (ref 0.3–1.2)
Total Protein: 7.2 g/dL (ref 6.5–8.1)

## 2019-08-17 LAB — TYPE AND SCREEN
ABO/RH(D): A POS
Antibody Screen: NEGATIVE

## 2019-08-17 LAB — CBC
HCT: 42.3 % (ref 39.0–52.0)
Hemoglobin: 13.7 g/dL (ref 13.0–17.0)
MCH: 30.6 pg (ref 26.0–34.0)
MCHC: 32.4 g/dL (ref 30.0–36.0)
MCV: 94.4 fL (ref 80.0–100.0)
Platelets: 295 10*3/uL (ref 150–400)
RBC: 4.48 MIL/uL (ref 4.22–5.81)
RDW: 12.5 % (ref 11.5–15.5)
WBC: 8.1 10*3/uL (ref 4.0–10.5)
nRBC: 0 % (ref 0.0–0.2)

## 2019-08-17 LAB — POC OCCULT BLOOD, ED: Fecal Occult Bld: NEGATIVE

## 2019-08-17 NOTE — ED Provider Notes (Signed)
[facility] EMERGENCY DEPARTMENT Provider Note   CSN: [id number] Arrival date & time: 08/16/19  2222     History Chief Complaint  Patient presents with  . Rectal Bleeding    Douglas Keller is a 79 y.o. male.  Patient with history of diabetes, cholesterol presents with bright red blood in his stool this evening.  No history of similar.  Patient had colonoscopy years ago he thinks 2014 that was unremarkable.  No abdominal pain, fevers, vomiting or infectious symptoms.  Patient currently has no signs or symptoms.  Patient is on aspirin no anticoagulant.        Past Medical History:  Diagnosis Date  . Aortic valve sclerosis   . Atherosclerosis of coronary artery without angina pectoris 07/05/2014  . Cervical spondylosis without myelopathy 02/13/2013  . Chest pain 01/29/2019  . Degeneration of cervical intervertebral disc 02/13/2013  . Diabetes mellitus type 2, diet-controlled ([place])   . Elevated troponin   . HTN (hypertension) 07/06/2013  . Hyperlipidemia 07/06/2013    Patient Active Problem List   Diagnosis Date Noted  . Dizzy spells 08/01/2019  . Depression, major, single episode, moderate ([place]) 07/07/2019  . Obesity (BMI 30.0-34.9) 06/21/2019  . Gastroesophageal reflux disease   . Type 2 diabetes mellitus with hyperlipidemia ([place]) 09/01/2018  . Heterozygous familial hypercholesterolemia 09/01/2018  . Hypercholesterolemia 08/21/2015  . Medication management 08/21/2015  . Atherosclerosis of coronary artery without angina pectoris 07/05/2014  . Stiffness of left shoulder joint 07/19/2013  . Hyperlipidemia 07/06/2013  . HTN (hypertension) 07/06/2013    Past Surgical History:  Procedure Laterality Date  . CERVICAL SPINE SURGERY    . COLONOSCOPY  07/26/2012   Procedure: COLONOSCOPY;  Surgeon: Jamesetta So, MD;  Location: [facility];  Service: Gastroenterology;  Laterality: N/A;  . KNEE ARTHROSCOPY         Family History  Problem Relation Age of Onset  . Asthma  Mother   . Hypertension Father   . Colon cancer Neg Hx     Social History   Tobacco Use  . Smoking status: Never Smoker  . Smokeless tobacco: Never Used  Substance Use Topics  . Alcohol use: No  . Drug use: No    Home Medications Prior to Admission medications   Medication Sig Start Date End Date Taking? Authorizing Provider  Accu-Chek Softclix Lancets lancets Use to test blood sugar once daily 06/28/19   Perlie Mayo, NP  acetaminophen (TYLENOL) 650 MG CR tablet Take 1,300 mg by mouth 2 (two) times daily.    [provider]  Alcohol Swabs (B-D SINGLE USE SWABS REGULAR) PADS  06/26/19   [provider]  amLODipine (NORVASC) 10 MG tablet Take 1 tablet (10 mg total) by mouth daily. 05/25/19   Perlie Mayo, NP  aspirin EC 81 MG tablet Take 81 mg by mouth daily.    [provider]  benazepril (LOTENSIN) 40 MG tablet Take 1 tablet (40 mg total) by mouth daily. 07/07/19   Perlie Mayo, NP  Blood Glucose Calibration (ACCU-CHEK AVIVA) SOLN  06/26/19   [provider]  blood glucose meter kit and supplies Dispense based on patient and insurance preference. Use to test blood sugar once daily. (FOR ICD-10 E10.9, E11.9). 06/22/19   Perlie Mayo, NP  buPROPion [facility] SR) 150 MG 12 hr tablet Take 1 tablet (150 mg total) by mouth daily. 08/01/19   Perlie Mayo, NP  Evolocumab (REPATHA SURECLICK) 771 MG/ML SOAJ Inject 140 mg into the  skin every 14 (fourteen) days. 06/22/19   Croitoru, Mihai, MD  gabapentin (NEURONTIN) 300 MG capsule Take 1 capsule (300 mg total) by mouth 3 (three) times daily. 06/05/19   Perlie Mayo, NP  GLUCOSAMINE PO Take 1 tablet by mouth at bedtime.    [provider]  glucose blood test strip Use to test blood sugar once daily 06/28/19   Perlie Mayo, NP  KLOR-CON M20 20 MEQ tablet Take 1 tablet (20 mEq total) by mouth daily. 06/01/19   Perlie Mayo, NP  meclizine (ANTIVERT) 12.5 MG tablet Take 1 tablet (12.5 mg  total) by mouth 2 (two) times daily as needed for dizziness. 08/01/19   Perlie Mayo, NP  Multiple Vitamins-Minerals (AIRBORNE PO) Take 1 each by mouth daily.    [provider]  pantoprazole (PROTONIX) 40 MG tablet Take 1 tablet (40 mg total) by mouth daily. 05/25/19   Perlie Mayo, NP  [facility] injection  02/03/19   [provider]    Allergies    Crestor [rosuvastatin] and Statins  Review of Systems   Review of Systems  Constitutional: Negative for chills and fever.  HENT: Negative for congestion.   Eyes: Negative for visual disturbance.  Respiratory: Negative for shortness of breath.   Cardiovascular: Negative for chest pain.  Gastrointestinal: Positive for blood in stool. Negative for abdominal pain and vomiting.  Genitourinary: Negative for dysuria and flank pain.  Musculoskeletal: Negative for back pain, neck pain and neck stiffness.  Skin: Negative for rash.  Neurological: Negative for light-headedness and headaches.    Physical Exam Updated Vital Signs BP (!) 167/95 (BP Location: Right Arm)   Pulse 85   Temp 99.2 F (37.3 C) (Oral)   Resp 16   Ht 5' 6"  (1.676 m)   Wt 90.7 kg   SpO2 99%   BMI 32.28 kg/m   Physical Exam Vitals and nursing note reviewed.  Constitutional:      Appearance: He is well-developed.  HENT:     Head: Normocephalic and atraumatic.  Eyes:     General:        Right eye: No discharge.        Left eye: No discharge.     Conjunctiva/sclera: Conjunctivae normal.  Neck:     Trachea: No tracheal deviation.  Cardiovascular:     Rate and Rhythm: Normal rate.  Pulmonary:     Effort: Pulmonary effort is normal.  Abdominal:     General: There is no distension.     Palpations: Abdomen is soft.     Tenderness: There is no abdominal tenderness. There is no guarding.  Genitourinary:    Comments: No external hemorrhoid appreciated, brown stool, normal rectal tone. Musculoskeletal:     Cervical back: Normal range of motion  and neck supple.  Skin:    General: Skin is warm.     Findings: No rash.  Neurological:     Mental Status: He is alert and oriented to person, place, and time.     ED Results / Procedures / Treatments   Labs (all labs ordered are listed, but only abnormal results are displayed) Labs Reviewed  COMPREHENSIVE METABOLIC PANEL - Abnormal; Notable for the following components:      Result Value   Glucose, Bld 126 (*)    Calcium 8.6 (*)    GFR calc non Af Amer 58 (*)    All other components within normal limits  CBC  POC OCCULT BLOOD, ED  TYPE  AND SCREEN    EKG None  Radiology No results found.  Procedures Procedures (including critical care time)  Medications Ordered in ED Medications - No data to display  ED Course  I have reviewed the triage vital signs and the nursing notes.  Pertinent labs & imaging results that were available during my care of the patient were reviewed by me and considered in my medical decision making (see chart for details).    MDM Rules/Calculators/A&P                      Patient presents after episode of bright red blood per rectum.  Concern clinically for internal hemorrhoid versus diverticular bleed.  Patient has no abdominal pain, no infectious signs or symptoms.  Hemoglobin reviewed and normal.  Brown stool Hemoccult negative.  Patient stable for close outpatient follow-up with gastroenterology strict reasons to return given. Final Clinical Impression(s) / ED Diagnoses Final diagnoses:  Rectal bleeding    Rx / DC Orders ED Discharge Orders    None       Elnora Morrison, MD 08/17/19 [phone]

## 2019-08-17 NOTE — Discharge Instructions (Signed)
Follow up closely with Gastroenterology. Return for heavier bleeding, feeling like you will pass out, abdominal pain, fevers or new concerns.

## 2019-08-22 ENCOUNTER — Encounter: Payer: Self-pay | Admitting: Internal Medicine

## 2019-08-22 ENCOUNTER — Ambulatory Visit (INDEPENDENT_AMBULATORY_CARE_PROVIDER_SITE_OTHER): Payer: Medicare HMO | Admitting: Family Medicine

## 2019-08-22 ENCOUNTER — Other Ambulatory Visit: Payer: Self-pay

## 2019-08-22 ENCOUNTER — Encounter: Payer: Self-pay | Admitting: Family Medicine

## 2019-08-22 VITALS — BP 165/91 | Resp 15 | Ht 66.0 in | Wt 200.0 lb

## 2019-08-22 DIAGNOSIS — I10 Essential (primary) hypertension: Secondary | ICD-10-CM

## 2019-08-22 DIAGNOSIS — F321 Major depressive disorder, single episode, moderate: Secondary | ICD-10-CM

## 2019-08-22 DIAGNOSIS — G4709 Other insomnia: Secondary | ICD-10-CM | POA: Diagnosis not present

## 2019-08-22 MED ORDER — BUPROPION HCL ER (SR) 150 MG PO TB12: 150.0000 mg | ORAL_TABLET | Freq: Every day | ORAL | 1 refills | Status: DC

## 2019-08-22 MED ORDER — HYDROXYZINE HCL 10 MG PO TABS: 10.0000 mg | ORAL_TABLET | Freq: Every day | ORAL | 0 refills | Status: DC

## 2019-08-22 NOTE — Patient Instructions (Addendum)
Happy New Year! May you have a year filled with hope, love, happiness and laughter.  I appreciate the opportunity to provide you with care for your health and wellness. Today we discussed: sleep trouble, recent ED visit   Follow up: 2 weeks phone for sleep follow up  No labs or referrals today  New medication to help with sleep: IT CAN MAKE YOU DIZZY, PLEASE BE MINDFUL OF THIS. Take it and go to bed.   Please continue to practice social distancing to keep you, your family, and our community safe.  If you must go out, please wear a mask and practice good handwashing.  It was a pleasure to see you and I look forward to continuing to work together on your health and well-being. Please do not hesitate to call the office if you need care or have questions about your care.  Have a wonderful day and week. With Gratitude, Cherly Beach, DNP, AGNP-BC

## 2019-08-22 NOTE — Assessment & Plan Note (Signed)
We will try low-dose Atarax to take right at bedtime.  Advised that this can make him dizzy and put him at high risk for falls but he reports that he is not sleeping he thinks that is why he is having pressure patient he reports he is taking his Wellbutrin as directed.  Does not have any anxiety prior to going to bed.  Reviewed side effects, risks and benefits of medication.   Patient acknowledged agreement and understanding of the plan.  Close follow-up in 2 weeks.

## 2019-08-22 NOTE — Assessment & Plan Note (Signed)
Controlled since Wellbutrin to [facility] for 66-month supply

## 2019-08-22 NOTE — Progress Notes (Signed)
Virtual Visit via Telephone Note   This visit type was conducted due to national recommendations for restrictions regarding the COVID-19 Pandemic (e.g. social distancing) in an effort to limit this patient's exposure and mitigate transmission in our community.  Due to his co-morbid illnesses, this patient is at least at moderate risk for complications without adequate follow up.  This format is felt to be most appropriate for this patient at this time.  The patient did not have access to video technology/had technical difficulties with video requiring transitioning to audio format only (telephone).  All issues noted in this document were discussed and addressed.  No physical exam could be performed with this format.    Evaluation Performed:  Follow-up visit  Date:  08/22/2019   ID:  Douglas Keller, DOB [birth year], MRN [id number]  Patient Location: Home Provider Location: Office  Location of Patient: Home Location of Provider: Telehealth Consent was obtain for visit to be over via telehealth. I verified that I am speaking with the correct person using two identifiers.  PCP:  Perlie Mayo, NP   Chief Complaint:  Recent ED visits  History of Present Illness:    Douglas Keller is a 79 y.o. male with history of aortic valve sclerosis, chest pain, diabetes type 2, elevated troponins, uncontrolled blood pressure, hyperlipidemia.  Presents today for follow-up regarding recent emergency room visit secondary to having rectal bleeding.  Additionally 2 days prior to that he had had some dizziness and and went to the emergency room as well.  Today he denies having any dizziness but reports that he still feels dizzy and out of sorts at times.  Reports that meclizine did not really help him.  He denies having any additional rectal bleeding since going to the emergency room.  Reports that his blood pressure still all over the place with 765Y to 650P systolically.  Does have an appointment coming up  with cardiology here soon hopefully can get something sorted out really appreciate their assistance with his blood pressure.  The patient does not have symptoms concerning for COVID-19 infection (fever, chills, cough, or new shortness of breath).   Past Medical, Surgical, Social History, Allergies, and Medications have been Reviewed.  Past Medical History:  Diagnosis Date  . Aortic valve sclerosis   . Atherosclerosis of coronary artery without angina pectoris 07/05/2014  . Cervical spondylosis without myelopathy 02/13/2013  . Chest pain 01/29/2019  . Degeneration of cervical intervertebral disc 02/13/2013  . Diabetes mellitus type 2, diet-controlled ([place])   . Elevated troponin   . HTN (hypertension) 07/06/2013  . Hyperlipidemia 07/06/2013   Past Surgical History:  Procedure Laterality Date  . CERVICAL SPINE SURGERY    . COLONOSCOPY  07/26/2012   Procedure: COLONOSCOPY;  Surgeon: Jamesetta So, MD;  [facility];  Service: Gastroenterology;  Laterality: N/A;  . KNEE ARTHROSCOPY       Current Meds  Medication Sig  . Accu-Chek Softclix Lancets lancets Use to test blood sugar once daily  . acetaminophen (TYLENOL) 650 MG CR tablet Take 1,300 mg by mouth 2 (two) times daily.  . Alcohol Swabs (B-D SINGLE USE SWABS REGULAR) PADS   . amLODipine (NORVASC) 10 MG tablet Take 1 tablet (10 mg total) by mouth daily.  Marland Kitchen aspirin EC 81 MG tablet Take 81 mg by mouth daily.  . benazepril (LOTENSIN) 40 MG tablet Take 1 tablet (40 mg total) by mouth daily.  . Blood Glucose Calibration (ACCU-CHEK AVIVA) SOLN   .  blood glucose meter kit and supplies Dispense based on patient and insurance preference. Use to test blood sugar once daily. (FOR ICD-10 E10.9, E11.9).  Marland Kitchen buPROPion (WELLBUTRIN SR) 150 MG 12 hr tablet Take 1 tablet (150 mg total) by mouth daily.  . Evolocumab (REPATHA SURECLICK) 106 MG/ML SOAJ Inject 140 mg into the skin every 14 (fourteen) days.  Marland Kitchen gabapentin (NEURONTIN) 300 MG capsule  Take 1 capsule (300 mg total) by mouth 3 (three) times daily.  Marland Kitchen GLUCOSAMINE PO Take 1 tablet by mouth at bedtime.  Marland Kitchen glucose blood test strip Use to test blood sugar once daily  . KLOR-CON M20 20 MEQ tablet Take 1 tablet (20 mEq total) by mouth daily.  . meclizine (ANTIVERT) 12.5 MG tablet Take 1 tablet (12.5 mg total) by mouth 2 (two) times daily as needed for dizziness.  . Multiple Vitamins-Minerals (AIRBORNE PO) Take 1 each by mouth daily.  . pantoprazole (PROTONIX) 40 MG tablet Take 1 tablet (40 mg total) by mouth daily.  . [DISCONTINUED] buPROPion (WELLBUTRIN SR) 150 MG 12 hr tablet Take 1 tablet (150 mg total) by mouth daily.     Allergies:   Crestor [rosuvastatin] and Statins   ROS:   Please see the history of present illness.    All other systems reviewed and are negative.   Labs/Other Tests and Data Reviewed:    Recent Labs: 01/29/2019: TSH 2.351 01/30/2019: Magnesium 2.1 08/17/2019: ALT 24; BUN 15; Creatinine, Ser 1.20; Hemoglobin 13.7; Platelets 295; Potassium 3.6; Sodium 142   Recent Lipid Panel Lab Results  Component Value Date/Time   CHOL 142 06/02/2019 08:14 AM   TRIG 112 06/02/2019 08:14 AM   HDL 55 06/02/2019 08:14 AM   CHOLHDL 2.6 06/02/2019 08:14 AM   CHOLHDL 3.3 01/29/2019 08:00 AM   LDLCALC 67 06/02/2019 08:14 AM    Wt Readings from Last 3 Encounters:  08/22/19 200 lb (90.7 kg)  08/16/19 200 lb (90.7 kg)  08/15/19 200 lb (90.7 kg)      Objective:    Vital Signs:  BP (!) 165/91   Resp 15   Ht _0  (1.676 m)   Wt 200 lb (90.7 kg)   BMI 32.28 kg/m    VITAL SIGNS:  reviewed GEN:  alert and oriented  RESPIRATORY:  no shortness of breath in conversation  PSYCH:  normal affect and mood   ASSESSMENT & PLAN:    1. Depression, major, single episode, moderate (HCC)  - buPROPion (WELLBUTRIN SR) 150 MG 12 hr tablet; Take 1 tablet (150 mg total) by mouth daily.  Dispense: 90 tablet; Refill: 1  2. Other insomnia  - hydrOXYzine (ATARAX/VISTARIL) 10  MG tablet; Take 1 tablet (10 mg total) by mouth at bedtime.  Dispense: 30 tablet; Refill: 0  3. Essential hypertension    Time:   Today, I have spent 10 minutes with the patient with telehealth technology discussing the above problems.     Medication Adjustments/Labs and Tests Ordered: Current medicines are reviewed at length with the patient today.  Concerns regarding medicines are outlined above.   Tests Ordered: No orders of the defined types were placed in this encounter.   Medication Changes: Meds ordered this encounter  Medications  . hydrOXYzine (ATARAX/VISTARIL) 10 MG tablet    Sig: Take 1 tablet (10 mg total) by mouth at bedtime.    Dispense:  30 tablet    Refill:  0    Order Specific Question:   Supervising Provider    Answer:  [place], [place] [[id number]]  . buPROPion (WELLBUTRIN SR) 150 MG 12 hr tablet    Sig: Take 1 tablet (150 mg total) by mouth daily.    Dispense:  90 tablet    Refill:  1    Order Specific Question:   Supervising Provider    Answer:   Fayrene Helper [[id number]]    Disposition:  Follow up 2-week follow-up Signed, Perlie Mayo, NP  08/22/2019 2:33 PM     [facility]

## 2019-08-22 NOTE — Assessment & Plan Note (Signed)
Still has demonstrated poor control.  Reports taking his medications as directed.  Does have upcoming cardiology appointment appreciate collaboration in his care.  Advised for him to continue taking all medications as directed.  As previously stated clonidine 0.1 once daily or twice daily might be a good suggestion or addition we will write for cardiology's input.  He did not tolerate hydrochlorothiazide or chlorthalidone.

## 2019-08-25 ENCOUNTER — Ambulatory Visit: Payer: Medicare HMO | Admitting: Family Medicine

## 2019-09-01 ENCOUNTER — Ambulatory Visit: Payer: Medicare HMO | Admitting: Cardiovascular Disease

## 2019-09-01 ENCOUNTER — Other Ambulatory Visit: Payer: Self-pay

## 2019-09-01 ENCOUNTER — Encounter: Payer: Self-pay | Admitting: Cardiovascular Disease

## 2019-09-01 VITALS — BP 162/98 | HR 81 | Temp 97.3°F | Ht 66.0 in | Wt 211.2 lb

## 2019-09-01 DIAGNOSIS — I10 Essential (primary) hypertension: Secondary | ICD-10-CM

## 2019-09-01 DIAGNOSIS — Z79899 Other long term (current) drug therapy: Secondary | ICD-10-CM | POA: Diagnosis not present

## 2019-09-01 DIAGNOSIS — E7800 Pure hypercholesterolemia, unspecified: Secondary | ICD-10-CM

## 2019-09-01 DIAGNOSIS — E669 Obesity, unspecified: Secondary | ICD-10-CM | POA: Diagnosis not present

## 2019-09-01 DIAGNOSIS — E1169 Type 2 diabetes mellitus with other specified complication: Secondary | ICD-10-CM

## 2019-09-01 DIAGNOSIS — I2510 Atherosclerotic heart disease of native coronary artery without angina pectoris: Secondary | ICD-10-CM | POA: Diagnosis not present

## 2019-09-01 MED ORDER — MAGNESIUM OXIDE 400 MG PO CAPS: 400.0000 mg | ORAL_CAPSULE | Freq: Every day | ORAL | 11 refills | Status: DC

## 2019-09-01 MED ORDER — SPIRONOLACTONE-HCTZ 25-25 MG PO TABS: 1.0000 | ORAL_TABLET | Freq: Every day | ORAL | 1 refills | Status: DC

## 2019-09-01 NOTE — Patient Instructions (Signed)
Medication Instructions:  START Magnesium Oxide 400 mg once daily. Please check the price over the counter because it may be cheaper. START Spironolactone-Hydrochlorothiazide 25-25 once tablet daily.  *If you need a refill on your cardiac medications before your next appointment, please call your pharmacy*  Lab Work: Your provider would like for you to return in one month to have the following labs drawn: BMET and Magenesium. You do not need an appointment for the lab. Once in our office lobby there is a podium where you can sign in and ring the doorbell to alert [place] that you are here. The lab is open from 8:00 am to 4:30 pm; closed for lunch from 12:45pm-1:45pm.  If you have labs (blood work) drawn today and your tests are completely normal, you will receive your results only by: Marland Kitchen MyChart Message (if you have MyChart) OR . A paper copy in the mail If you have any lab test that is abnormal or we need to change your treatment, we will call you to review the results.  Testing/Procedures: None ordered  Follow-Up: At [facility], you and your health needs are our priority.  As part of our continuing mission to provide you with exceptional heart care, we have created designated Provider Care Teams.  These Care Teams include your primary Cardiologist (physician) and Advanced Practice Providers (APPs -  Physician Assistants and Nurse Practitioners) who all work together to provide you with the care you need, when you need it.  Your next appointment:   1 month(s)  The format for your next appointment:   In Person  Provider:   You may see Sanda Klein, MD or one of the following Advanced Practice Providers on your designated Care Team:    Almyra Deforest, PA-C  Fabian Sharp, [place] or   Roby Lofts, [place]   Other Instructions Dr. Sallyanne Kuster would like you to check your blood pressure daily for the next 2  weeks.  Keep a journal of these daily blood pressure and heart rate readings and call our  office or send a message through [place] with the results. Thank you!

## 2019-09-01 NOTE — Progress Notes (Signed)
[name], male   DOB: Feb 08, 1941, 79 y.o.   MRN: [id number]    Cardiology Office Note    Date:  09/02/2019   ID:  Douglas Keller, DOB 02-Mar-1941, MRN [id number]  PCP:  Perlie Mayo, NP  Cardiologist:   Sanda Klein, MD   No chief complaint on file.   History of Present Illness:  Douglas Keller is a 79 y.o. male with hyperlipidemia, hypertension, moderate coronary atherosclerosis and a murmur due to aortic valve sclerosis without stenosis.  He has recently had problems with dizzy spells, headache and ringing in his right ear.  His gait is a little staggering.  He was having problems with muscle cramps and his chlorthalidone was discontinued.  Since then his blood pressure has been very high despite taking maximum doses of amlodipine and benazepril.  He denies focal neurological complaints and does not have chest pain either at rest or with activity.  He has not had syncope, falls, leg edema, orthopnea, PND or exertional dyspnea or chest pain.  He continues to have occasional episodes of burning and cramping in his right leg.  He has known lumbar spine stenosis. He was seen in the emergency room on January 26 for dizziness and again on January 28 for hematochezia.  His blood pressure was markedly elevated on both occasions.  An event monitor in November 2018 showed relative bradycardia and a suggestion of chronotropic incompetence.  He did not have any symptoms and a pacemaker was not necessary.  His echocardiogram in 2020 showed borderline LVEF of 45-50% (similar to EF 48% by nuclear stress test in 2016) and impaired relaxation  He was intolerant of multiple statins even when we tried to supplement coenzyme Q 10.  He is now on Repatha and tolerating it very well.  Cardiac catheterization in 2006 showed  non obstructive atherosclerosis (40% proximal LAD, 40% mid RCA) and had nuclear stress test in 2018 showed normal perfusion, LVEF was only 48% .  Normal ABI bilaterally  February 2020  Past Medical History:  Diagnosis Date  . Aortic valve sclerosis   . Atherosclerosis of coronary artery without angina pectoris 07/05/2014  . Cervical spondylosis without myelopathy 02/13/2013  . Chest pain 01/29/2019  . Degeneration of cervical intervertebral disc 02/13/2013  . Diabetes mellitus type 2, diet-controlled ([place])   . Elevated troponin   . HTN (hypertension) 07/06/2013  . Hyperlipidemia 07/06/2013    Past Surgical History:  Procedure Laterality Date  . CERVICAL SPINE SURGERY    . COLONOSCOPY  07/26/2012   Procedure: COLONOSCOPY;  Surgeon: Jamesetta So, MD;  Location[facility];  Service: Gastroenterology;  Laterality: N/A;  . KNEE ARTHROSCOPY      Outpatient Medications Prior to Visit  Medication Sig Dispense Refill  . Accu-Chek Softclix Lancets lancets Use to test blood sugar once daily 100 each 0  . acetaminophen (TYLENOL) 650 MG CR tablet Take 1,300 mg by mouth 2 (two) times daily.    . Alcohol Swabs (B-D SINGLE USE SWABS REGULAR) PADS     . amLODipine (NORVASC) 10 MG tablet Take 1 tablet (10 mg total) by mouth daily. 90 tablet 1  . aspirin EC 81 MG tablet Take 81 mg by mouth daily.    . benazepril (LOTENSIN) 40 MG tablet Take 1 tablet (40 mg total) by mouth daily. 90 tablet 1  . Blood Glucose Calibration (ACCU-CHEK AVIVA) SOLN     . blood glucose meter kit and supplies Dispense based on patient and insurance preference.  Use to test blood sugar once daily. (FOR ICD-10 E10.9, E11.9). 1 each 0  . buPROPion (WELLBUTRIN SR) 150 MG 12 hr tablet Take 1 tablet (150 mg total) by mouth daily. 90 tablet 1  . Evolocumab (REPATHA SURECLICK) 655 MG/ML SOAJ Inject 140 mg into the skin every 14 (fourteen) days. 2 pen 11  . gabapentin (NEURONTIN) 300 MG capsule Take 1 capsule (300 mg total) by mouth 3 (three) times daily. 21 capsule 0  . GLUCOSAMINE PO Take 1 tablet by mouth at bedtime.    Marland Kitchen glucose blood test strip Use to test blood sugar once daily 100 each 0  .  hydrOXYzine (ATARAX/VISTARIL) 10 MG tablet Take 1 tablet (10 mg total) by mouth at bedtime. 30 tablet 0  . KLOR-CON M20 20 MEQ tablet Take 1 tablet (20 mEq total) by mouth daily. 90 tablet 0  . meclizine (ANTIVERT) 12.5 MG tablet Take 1 tablet (12.5 mg total) by mouth 2 (two) times daily as needed for dizziness. 30 tablet 0  . Multiple Vitamins-Minerals (AIRBORNE PO) Take 1 each by mouth daily.    . pantoprazole (PROTONIX) 40 MG tablet Take 1 tablet (40 mg total) by mouth daily. 90 tablet 1   No facility-administered medications prior to visit.     Allergies:   Crestor [rosuvastatin] and Statins   Social History   Socioeconomic History  . Marital status: Widowed    Spouse name: Douglas Keller   . Number of children: 1  . Years of education: Not on file  . Highest education level: 12th grade  Occupational History    Comment: Retired   Tobacco Use  . Smoking status: Never Smoker  . Smokeless tobacco: Never Used  Substance and Sexual Activity  . Alcohol use: No  . Drug use: No  . Sexual activity: Not Currently  Other Topics Concern  . Not on file  Social History Narrative   Live alone right now, wife passed in 2009    [place] [address] live in [place]      Enjoys playing golf      Diet: eats what he likes, sweet, fast food, does not eat a lot of canned or boxed foods.    Does not eat enough fruits and veggies.       Caffeine: 1 cup daily   Water: 3-4 bottles of water daily, Gatorade every now and then         Wears seat belt   Wears sun protection when golfing   Smoke detectors at home   Does not use phone while driving   Social Determinants of Health   Financial Resource Strain: Low Risk   . Difficulty of Paying Living Expenses: Not hard at all  Food Insecurity: No Food Insecurity  . Worried About Charity fundraiser in the Last Year: Never true  . Ran Out of Food in the Last Year: Never true  Transportation Needs: No Transportation Needs  . Lack of  Transportation (Medical): No  . Lack of Transportation (Non-Medical): No  Physical Activity: Insufficiently Active  . Days of Exercise per Week: 5 days  . Minutes of Exercise per Session: 20 min  Stress: Stress Concern Present  . Feeling of Stress : Rather much  Social Connections: Somewhat Isolated  . Frequency of Communication with Friends and Family: More than three times a week  . Frequency of Social Gatherings with Friends and Family: More than three times a week  . Attends Religious Services: More than 4 times  per year  . Active Member of Clubs or Organizations: No  . Attends Archivist Meetings: Never  . Marital Status: Widowed     Family History:  The patient's family history includes Asthma in his mother; Hypertension in his father.   ROS:   Please see the history of present illness.    ROS All other systems are reviewed and are negative.   PHYSICAL EXAM:   VS:  BP (!) 162/98   Pulse 81   Temp (!) 97.3 F (36.3 C)   Ht 5' 6"  (1.676 m)   Wt 211 lb 3.2 oz (95.8 kg)   SpO2 94%   BMI 34.09 kg/m     General: Alert, oriented x3, no distress, obese Head: no evidence of trauma, PERRL, EOMI, no exophtalmos or lid lag, no myxedema, no xanthelasma; normal ears, nose and oropharynx Neck: normal jugular venous pulsations and no hepatojugular reflux; brisk carotid pulses without delay and no carotid bruits Chest: clear to auscultation, no signs of consolidation by percussion or palpation, normal fremitus, symmetrical and full respiratory excursions Cardiovascular: normal position and quality of the apical impulse, regular rhythm, normal first and second heart sounds, 2/6 aortic ejection murmur, early peaking, no diastolic murmurs, rubs.  S4 is present. Abdomen: no tenderness or distention, no masses by palpation, no abnormal pulsatility or arterial bruits, normal bowel sounds, no hepatosplenomegaly Extremities: no clubbing, cyanosis or edema; 2+ radial, ulnar and  brachial pulses bilaterally; 2+ right femoral, posterior tibial and dorsalis pedis pulses; 2+ left femoral, posterior tibial and dorsalis pedis pulses; no subclavian or femoral bruits Neurological: grossly nonfocal Psych: Normal mood and affect  Wt Readings from Last 3 Encounters:  09/01/19 211 lb 3.2 oz (95.8 kg)  08/22/19 200 lb (90.7 kg)  08/16/19 200 lb (90.7 kg)      Studies/Labs Reviewed:   EKG:  EKG is not ordered today.  Reviewed the tracing from 08/15/2019 which shows sinus rhythm and delayed R wave progression, but no ischemic repolarization abnormalities. Recent Labs: 01/29/2019: TSH 2.351 01/30/2019: Magnesium 2.1 08/17/2019: ALT 24; BUN 15; Creatinine, Ser 1.20; Hemoglobin 13.7; Platelets 295; Potassium 3.6; Sodium 142   Lipid Panel    Component Value Date/Time   CHOL 142 06/02/2019 0814   TRIG 112 06/02/2019 0814   HDL 55 06/02/2019 0814   CHOLHDL 2.6 06/02/2019 0814   CHOLHDL 3.3 01/29/2019 0800   VLDL 30 01/29/2019 0800   LDLCALC 67 06/02/2019 0814     ASSESSMENT:    1. Essential hypertension   2. Hypercholesterolemia   3. Coronary artery disease involving native coronary artery of native heart without angina pectoris   4. Diabetes mellitus type 2 in obese ([place])   5. Medication management      PLAN:  In order of problems listed above:  1. HTN: Poorly controlled and many of his recent complaints could be explained by severe hypertension.  It seems that his blood pressure control deteriorated after his Thalitone was discontinued.  He was having a lot of muscle cramps.  We will start a combined thiazide/potassium sparing diuretic combination as well as magnesium supplements to help prevent muscle cramps while normalizing his blood pressure. 2. HLP: His untreated LDL is extremely high, strongly suspected of having [place].  Intolerant to multiple statins but had an excellent response to Repatha with an  LDL of 61. 3. CAD: Asymptomatic, nonobstructive but widespread  atherosclerotic lesions at the time of previous coronary angiography. 4. DM/obesity: Reasonably controlled with diet and (limited) exercise  with A1c of % in December 2020.  Encouraged weight loss.  Exercise is limited by his leg pain.  Reviewed the concept of the glycemic index and a diet rich in protein and nonsaturated fat, lower in carbohydrates and saturated fat. 5. Bilateral leg pain: As before, symptoms are consistent with lumbar spondyloarthropathy, not PAD.  He had normal ABI bilateral, February 2020.   Medication Adjustments/Labs and Tests Ordered: Current medicines are reviewed at length with the patient today.  Concerns regarding medicines are outlined above.  Medication changes, Labs and Tests ordered today are listed in the Patient Instructions below. Patient Instructions  Medication Instructions:  START Magnesium Oxide 400 mg once daily. Please check the price over the counter because it may be cheaper. START Spironolactone-Hydrochlorothiazide 25-25 once tablet daily.  *If you need a refill on your cardiac medications before your next appointment, please call your pharmacy*  Lab Work: Your provider would like for you to return in one month to have the following labs drawn: BMET and Magenesium. You do not need an appointment for the lab. Once in our office lobby there is a podium where you can sign in and ring the doorbell to alert [place] that you are here. The lab is open from 8:00 am to 4:30 pm; closed for lunch from 12:45pm-1:45pm.  If you have labs (blood work) drawn today and your tests are completely normal, you will receive your results only by: Marland Kitchen MyChart Message (if you have MyChart) OR . A paper copy in the mail If you have any lab test that is abnormal or we need to change your treatment, we will call you to review the results.  Testing/Procedures: None ordered  Follow-Up: At [facility], you and your health needs are our priority.  As part of our continuing mission to  provide you with exceptional heart care, we have created designated Provider Care Teams.  These Care Teams include your primary Cardiologist (physician) and Advanced Practice Providers (APPs -  Physician Assistants and Nurse Practitioners) who all work together to provide you with the care you need, when you need it.  Your next appointment:   1 month(s)  The format for your next appointment:   In Person  Provider:   You may see Sanda Klein, MD or one of the following Advanced Practice Providers on your designated Care Team:    Almyra Deforest, PA-C  Fabian Sharp, [place] or   Roby Lofts, [place]   Other Instructions Dr. Sallyanne Kuster would like you to check your blood pressure daily for the next 2  weeks.  Keep a journal of these daily blood pressure and heart rate readings and call our office or send a message through [place] with the results. Thank you!        Signed, Sanda Klein, MD  09/02/2019 1:37 PM    [facility] [place], [place],   [address] Phone: [phone]; Fax: [phone]

## 2019-09-05 ENCOUNTER — Encounter: Payer: Self-pay | Admitting: Family Medicine

## 2019-09-05 ENCOUNTER — Other Ambulatory Visit: Payer: Self-pay

## 2019-09-05 ENCOUNTER — Ambulatory Visit (INDEPENDENT_AMBULATORY_CARE_PROVIDER_SITE_OTHER): Payer: Medicare HMO | Admitting: Family Medicine

## 2019-09-05 ENCOUNTER — Ambulatory Visit: Payer: MEDICARE | Admitting: Family Medicine

## 2019-09-05 VITALS — BP 162/94 | HR 72 | Temp 97.4°F | Resp 15 | Ht 66.0 in | Wt 208.0 lb

## 2019-09-05 DIAGNOSIS — M542 Cervicalgia: Secondary | ICD-10-CM | POA: Diagnosis not present

## 2019-09-05 DIAGNOSIS — I10 Essential (primary) hypertension: Secondary | ICD-10-CM | POA: Diagnosis not present

## 2019-09-05 DIAGNOSIS — G4709 Other insomnia: Secondary | ICD-10-CM | POA: Diagnosis not present

## 2019-09-05 DIAGNOSIS — M25519 Pain in unspecified shoulder: Secondary | ICD-10-CM

## 2019-09-05 NOTE — Assessment & Plan Note (Signed)
Referral today to orthopedic surgery as he reports that he has had injections done before.  Possibly in need of those again.  Does demonstrate pretty good range of motion with right shoulder on PE today.  Does have some limited movement with turning his neck and pain initiated with mobility of the cervical spine.  Appreciate collaboration in his care please let PCP know if there is anything I can do them I am.Patient acknowledged agreement and understanding of the plan.

## 2019-09-05 NOTE — Patient Instructions (Signed)
Happy New Year! May you have a year filled with hope, love, happiness and laughter.  I appreciate the opportunity to provide you with care for your health and wellness. Today we discussed: BP, shoulder and neck pain  Follow up: In May as scheduled  Labs 1 week before next appt  Referral to Ortho made today for neck and shoulder pain   Please continue to practice social distancing to keep you, your family, and our community safe.  If you must go out, please wear a mask and practice good handwashing.  It was a pleasure to see you and I look forward to continuing to work together on your health and well-being. Please do not hesitate to call the office if you need care or have questions about your care.  Have a wonderful day and week. With Gratitude, Cherly Beach, DNP, AGNP-BC

## 2019-09-05 NOTE — Assessment & Plan Note (Signed)
He notes improvement.  Advised him not to take the Atarax anymore if he is sleeping better.  Possibly related to uncontrolled blood pressure

## 2019-09-05 NOTE — Assessment & Plan Note (Signed)
Reports that he just recently started taking the medications that cardiology ordered for him.  He reports that has not had any trouble last 2 days with him.  He will be following up with cardiology regarding this.  I have advised for him to make sure that he takes medications as directed and the second that he knows that there might be an issue or concern not to stop them until he talk to the cardiologist.Patient acknowledged agreement and understanding of the plan.

## 2019-09-05 NOTE — Progress Notes (Signed)
Subjective:  [name], male    DOB: 1941/04/27  Age: 79 y.o. MRN: [id number]  CC:  Chief Complaint  Patient presents with  . sleep    2 week follow up      HPI  HPI Douglas Keller is a 79 y.o. male with history of aortic valve sclerosis, chest pain, diabetes type 2, elevated troponins, uncontrolled blood pressure, hyperlipidemia.  Mr. Douglas Keller is here for 2-week follow-up regarding his sleep.  At his last appointment on February 2 he reported that he was having trouble sleeping.  He was started on low-dose Atarax at bedtime.  Provided with education regarding the risk of falls if he were to take it.  He reports he is also taking his Wellbutrin as directed and his mood is okay.  He did denies having any anxiety while going to bed.  Today he reports that he took the medication the first night he went to bed great.  The second night he reports that he took it and he did not sleep he tossed and turned got up and his blood pressure was 200 over something.  He reports he is not taking it since.  He did not call the office to discuss this at that time as he began appointment.  He reports that he has been sleeping better the last 2 days since starting the new blood pressure medicine.  In addition he has poor blood pressure control and he did have follow-up with cardiology they did adjust some of his medications by adding spironolactone with hydrochlorothiazide.  In addition to magnesium, to see if we can get his blood pressure in better control.  He has follow-up with Dr. Sallyanne Keller in 2 weeks time per him to discuss how he is doing with the changes.  He reports to me that even though the medication was ordered on the 12th he just got the medication in the last day so he is only been on it for 2 days now.  His biggest concern today in the office is a questionable pinched nerve that he was told that he had several years back.  He had not had any body follow-up on it.  He reports that  he has neck pain and right shoulder pain.  He denies any changes in his range of motion or trouble doing any daily activities.  He does report that he did have an injection at one time into his neck but he does not remember exactly when.  Today patient denies signs and symptoms of COVID 19 infection including fever, chills, cough, shortness of breath, and headache. Past Medical, Surgical, Social History, Allergies, and Medications have been Reviewed.   Past Medical History:  Diagnosis Date  . Aortic valve sclerosis   . Atherosclerosis of coronary artery without angina pectoris 07/05/2014  . Cervical spondylosis without myelopathy 02/13/2013  . Chest pain 01/29/2019  . Degeneration of cervical intervertebral disc 02/13/2013  . Diabetes mellitus type 2, diet-controlled ([place])   . Elevated troponin   . HTN (hypertension) 07/06/2013  . Hyperlipidemia 07/06/2013    Current Meds  Medication Sig  . Accu-Chek Softclix Lancets lancets Use to test blood sugar once daily  . acetaminophen (TYLENOL) 650 MG CR tablet Take 1,300 mg by mouth 2 (two) times daily.  . Alcohol Swabs (B-D SINGLE USE SWABS REGULAR) PADS   . amLODipine (NORVASC) 10 MG tablet Take 1 tablet (10 mg total) by mouth daily.  Marland Kitchen aspirin EC 81 MG tablet Take  81 mg by mouth daily.  . benazepril (LOTENSIN) 40 MG tablet Take 1 tablet (40 mg total) by mouth daily.  . Blood Glucose Calibration (ACCU-CHEK AVIVA) SOLN   . blood glucose meter kit and supplies Dispense based on patient and insurance preference. Use to test blood sugar once daily. (FOR ICD-10 E10.9, E11.9).  Marland Kitchen buPROPion (WELLBUTRIN SR) 150 MG 12 hr tablet Take 1 tablet (150 mg total) by mouth daily.  . Evolocumab (REPATHA SURECLICK) 627 MG/ML SOAJ Inject 140 mg into the skin every 14 (fourteen) days.  Marland Kitchen gabapentin (NEURONTIN) 300 MG capsule Take 1 capsule (300 mg total) by mouth 3 (three) times daily.  Marland Kitchen GLUCOSAMINE PO Take 1 tablet by mouth at bedtime.  Marland Kitchen glucose blood test  strip Use to test blood sugar once daily  . hydrOXYzine (ATARAX/VISTARIL) 10 MG tablet Take 1 tablet (10 mg total) by mouth at bedtime.  Marland Kitchen KLOR-CON M20 20 MEQ tablet Take 1 tablet (20 mEq total) by mouth daily.  . Magnesium Oxide 400 MG CAPS Take 1 capsule (400 mg total) by mouth daily.  . meclizine (ANTIVERT) 12.5 MG tablet Take 1 tablet (12.5 mg total) by mouth 2 (two) times daily as needed for dizziness.  . Multiple Vitamins-Minerals (AIRBORNE PO) Take 1 each by mouth daily.  . pantoprazole (PROTONIX) 40 MG tablet Take 1 tablet (40 mg total) by mouth daily.  Marland Kitchen spironolactone-hydrochlorothiazide (ALDACTAZIDE) 25-25 MG tablet Take 1 tablet by mouth daily.    ROS:  Review of Systems  Constitutional: Negative.   HENT: Negative.   Eyes: Negative.   Respiratory: Negative.   Cardiovascular: Negative.   Gastrointestinal: Negative.   Genitourinary: Negative.   Musculoskeletal: Positive for joint pain and neck pain.  Skin: Negative.   Neurological: Negative.   Endo/Heme/Allergies: Negative.   Psychiatric/Behavioral: Negative.   All other systems reviewed and are negative.    Objective:   Today's Vitals: BP (!) 162/94   Pulse 72   Temp (!) 97.4 F (36.3 C) (Temporal)   Resp 15   Ht _0  (1.676 m)   Wt 208 lb (94.3 kg)   SpO2 96%   BMI 33.57 kg/m  Vitals with BMI 09/05/2019 09/01/2019 08/22/2019  Height _1  _2  _3   Weight 208 lbs 211 lbs 3 oz 200 lbs  BMI 33.59 03.5 00.9  Systolic 381 829 937  Diastolic 94 98 91  Pulse 72 81 -     Physical Exam Vitals and nursing note reviewed.  Constitutional:      Appearance: Normal appearance. He is well-developed and well-groomed. He is obese.  HENT:     Head: Normocephalic and atraumatic.     Comments: Mask in place    Right Ear: External ear normal.     Left Ear: External ear normal.  Eyes:     General:        Right eye: No discharge.        Left eye: No discharge.     Conjunctiva/sclera: Conjunctivae normal.   Cardiovascular:     Rate and Rhythm: Normal rate and regular rhythm.     Pulses: Normal pulses.     Heart sounds: Normal heart sounds.  Pulmonary:     Effort: Pulmonary effort is normal.     Breath sounds: Normal breath sounds.  Musculoskeletal:        General: Normal range of motion.     Right shoulder: Normal.     Left shoulder: Normal.     Right upper  arm: Normal.     Left upper arm: Normal.     Cervical back: Normal range of motion and neck supple. Tenderness present. No swelling. Pain with movement present.  Skin:    General: Skin is warm.  Neurological:     General: No focal deficit present.     Mental Status: He is alert and oriented to person, place, and time.  Psychiatric:        Attention and Perception: Attention normal.        Mood and Affect: Mood normal.        Speech: Speech normal.        Behavior: Behavior normal. Behavior is cooperative.        Thought Content: Thought content normal.        Cognition and Memory: Cognition normal.        Judgment: Judgment normal.     Assessment   1. Neck and shoulder pain   2. Other insomnia   3. Essential hypertension     Tests ordered Orders Placed This Encounter  Procedures  . Ambulatory referral to Orthopedic Surgery     Plan: Please see assessment and plan per problem list above.   No orders of the defined types were placed in this encounter.   Patient to follow-up in 11/29/2019   Perlie Mayo, NP

## 2019-09-08 ENCOUNTER — Telehealth: Payer: Self-pay | Admitting: Cardiovascular Disease

## 2019-09-08 ENCOUNTER — Other Ambulatory Visit: Payer: Self-pay | Admitting: Nurse Practitioner

## 2019-09-08 ENCOUNTER — Telehealth: Payer: Self-pay

## 2019-09-08 DIAGNOSIS — I10 Essential (primary) hypertension: Secondary | ICD-10-CM

## 2019-09-08 DIAGNOSIS — Z79899 Other long term (current) drug therapy: Secondary | ICD-10-CM | POA: Diagnosis not present

## 2019-09-08 DIAGNOSIS — E7800 Pure hypercholesterolemia, unspecified: Secondary | ICD-10-CM | POA: Diagnosis not present

## 2019-09-08 NOTE — Telephone Encounter (Signed)
New message:    Patient calling concerning his potasium going down. He would like for some one to give a call concerning he is having some problems with some medication. Please call patient back.

## 2019-09-08 NOTE — Telephone Encounter (Signed)
   Pt called this AM to question whether or not mild unsteadiness that he experienced earlier might be related to hypokalemia.  He was recently started on spironolactone-hctz (2/12).  Yesterday, and again today, he briefly felt unsteady on his feet w/ assoc lightheadedness.  BP at the time this AM, was 170/80.  He has not had any cramping.  I advised that unsteadiness and lightheadedness are not common symptoms of hypokalemia and as his BP was actually high, not low, it isn't entirely clear why he might have felt that way.  Orthostasis is certainly possible.  Given recent diuretic start, I have sent in Rx to [facility] [address] in [place] for BMET today.  He will continue to follow BP @ home and says that he's supposed to report BPs to Dr. Victorino December team next Friday.  Caller verbalized understanding and was grateful for the call back.  He will present to [facility] today.  Murray Hodgkins, NP 09/08/2019, 11:41 AM

## 2019-09-08 NOTE — Telephone Encounter (Signed)
Pt is calling to see if he can take 2 potassium pills to build his potassium up, if that is the problem, He is already taken 1 potassium pill,  He also ate a banana yesterday and that seemed to help.  Please call pt

## 2019-09-09 LAB — BASIC METABOLIC PANEL
BUN/Creatinine Ratio: 10 (ref 10–24)
BUN: 13 mg/dL (ref 8–27)
CO2: 24 mmol/L (ref 20–29)
Calcium: 9.4 mg/dL (ref 8.6–10.2)
Chloride: 103 mmol/L (ref 96–106)
Creatinine, Ser: 1.27 mg/dL (ref 0.76–1.27)
GFR calc Af Amer: 63 mL/min/{1.73_m2} (ref >59)
GFR calc non Af Amer: 54 mL/min/{1.73_m2} — ABNORMAL LOW (ref >59)
Glucose: 110 mg/dL — ABNORMAL HIGH (ref 65–99)
Potassium: 4.2 mmol/L (ref 3.5–5.2)
Sodium: 142 mmol/L (ref 134–144)

## 2019-09-09 LAB — MAGNESIUM: Magnesium: 2.3 mg/dL (ref 1.6–2.3)

## 2019-09-11 ENCOUNTER — Other Ambulatory Visit: Payer: Self-pay

## 2019-09-11 DIAGNOSIS — M542 Cervicalgia: Secondary | ICD-10-CM

## 2019-09-11 DIAGNOSIS — M25519 Pain in unspecified shoulder: Secondary | ICD-10-CM

## 2019-09-11 NOTE — Telephone Encounter (Signed)
Spoke with patient and he stated his cardiologist had handled it for him.

## 2019-09-13 ENCOUNTER — Ambulatory Visit: Payer: MEDICARE | Admitting: Gastroenterology

## 2019-09-21 ENCOUNTER — Other Ambulatory Visit: Payer: Self-pay

## 2019-09-21 ENCOUNTER — Ambulatory Visit: Payer: Medicare HMO | Admitting: Nurse Practitioner

## 2019-09-21 ENCOUNTER — Telehealth: Payer: Self-pay

## 2019-09-21 ENCOUNTER — Encounter: Payer: Self-pay | Admitting: Nurse Practitioner

## 2019-09-21 DIAGNOSIS — K625 Hemorrhage of anus and rectum: Secondary | ICD-10-CM

## 2019-09-21 HISTORY — DX: Hemorrhage of anus and rectum: K62.5

## 2019-09-21 MED ORDER — PEG 3350-KCL-NA BICARB-NACL 420 G PO SOLR: 4000.0000 mL | ORAL | 0 refills | Status: DC

## 2019-09-21 NOTE — Progress Notes (Signed)
Primary Care Physician:  Perlie Mayo, NP Primary Gastroenterologist:  Dr. Gala Romney  Chief Complaint  Patient presents with  . Rectal Bleeding    occurred 1 month ago    HPI:   Douglas Keller is a 79 y.o. male who presents on referral from primary care to schedule colonoscopy  Reviewed information associated with referral including office visit with primary care 08/22/2019 which is a emergency room follow-up secondary to rectal bleeding.  Also noted dizziness.  No further rectal bleeding since ER visit a few days prior.  In the ER on 08/17/2019 when he presented for rectal bleeding there is no external hemorrhoids appreciated, noted brown stool and a normal rectal tone.  His CBC was normal.  Recommended follow-up with GI.  Last colonoscopy 07/26/2012 by Dr. Aviva Signs in surgery.  Findings included abnormal mucosa in the proximal ascending colon status post biopsy, consider possibly related to prep.  Repeat colonoscopy in 10 years.  Surgical pathology found the biopsies to be patchy chronic minimally active colitis with focal ulceration/erosion.  Favored NSAID effect.  Full stool cultures including C. difficile, Giardia, fecal lactoferrin, ova and parasite, stool culture were all negative.  Today he states he's doing ok overall. He had one episode of rectal bleeding about a month ago x 1 episode, none since. He states it was "quite a bit" which was enough to get him to go to the ER. He was taking a bath at the time, and when he stood up there was blood. Wasn't constipated at the time. At times he has to strain to have a stool. Denies abdominal pain pain, N/V, melena, fever, chills, unintentional weight loss. Denies No family history of CRC. He has never had polyps before. Denies URI or flu-like symptoms. Denies loss of sense of taste or smell. Was tested for COVID-19 six months ago which was negative. He has had both vaccines. Denies chest pain, dyspnea, dizziness, lightheadedness, syncope, near  syncope. Denies any other upper or lower GI symptoms.  Past Medical History:  Diagnosis Date  . Aortic valve sclerosis   . Atherosclerosis of coronary artery without angina pectoris 07/05/2014  . Cervical spondylosis without myelopathy 02/13/2013  . Chest pain 01/29/2019  . Degeneration of cervical intervertebral disc 02/13/2013  . Diabetes mellitus type 2, diet-controlled ([place])   . Elevated troponin   . HTN (hypertension) 07/06/2013  . Hyperlipidemia 07/06/2013    Past Surgical History:  Procedure Laterality Date  . CERVICAL SPINE SURGERY    . COLONOSCOPY  07/26/2012   Procedure: COLONOSCOPY;  Surgeon: Jamesetta So, MD;  Location: [facility];  Service: Gastroenterology;  Laterality: N/A;  . KNEE ARTHROSCOPY      Current Outpatient Medications  Medication Sig Dispense Refill  . Accu-Chek Softclix Lancets lancets Use to test blood sugar once daily 100 each 0  . acetaminophen (TYLENOL) 650 MG CR tablet Take 1,300 mg by mouth 2 (two) times daily.    . Alcohol Swabs (B-D SINGLE USE SWABS REGULAR) PADS     . amLODipine (NORVASC) 10 MG tablet Take 1 tablet (10 mg total) by mouth daily. 90 tablet 1  . aspirin EC 81 MG tablet Take 81 mg by mouth daily.    . benazepril (LOTENSIN) 40 MG tablet Take 1 tablet (40 mg total) by mouth daily. 90 tablet 1  . Blood Glucose Calibration (ACCU-CHEK AVIVA) SOLN     . blood glucose meter kit and supplies Dispense based on patient and insurance preference. Use to  test blood sugar once daily. (FOR ICD-10 E10.9, E11.9). 1 each 0  . buPROPion (WELLBUTRIN SR) 150 MG 12 hr tablet Take 1 tablet (150 mg total) by mouth daily. 90 tablet 1  . Evolocumab (REPATHA SURECLICK) 829 MG/ML SOAJ Inject 140 mg into the skin every 14 (fourteen) days. 2 pen 11  . gabapentin (NEURONTIN) 300 MG capsule Take 1 capsule (300 mg total) by mouth 3 (three) times daily. 21 capsule 0  . GLUCOSAMINE PO Take 1 tablet by mouth at bedtime.    Marland Kitchen glucose blood test strip Use to test blood  sugar once daily 100 each 0  . hydrOXYzine (ATARAX/VISTARIL) 10 MG tablet Take 1 tablet (10 mg total) by mouth at bedtime. 30 tablet 0  . KLOR-CON M20 20 MEQ tablet Take 1 tablet (20 mEq total) by mouth daily. 90 tablet 0  . Magnesium Oxide 400 MG CAPS Take 1 capsule (400 mg total) by mouth daily. 30 capsule 11  . meclizine (ANTIVERT) 12.5 MG tablet Take 1 tablet (12.5 mg total) by mouth 2 (two) times daily as needed for dizziness. 30 tablet 0  . Multiple Vitamins-Minerals (AIRBORNE PO) Take 1 each by mouth daily.    . pantoprazole (PROTONIX) 40 MG tablet Take 1 tablet (40 mg total) by mouth daily. 90 tablet 1  . spironolactone-hydrochlorothiazide (ALDACTAZIDE) 25-25 MG tablet Take 1 tablet by mouth daily. 30 tablet 1   No current facility-administered medications for this visit.    Allergies as of 09/21/2019 - Review Complete 09/21/2019  Allergen Reaction Noted  . Crestor [rosuvastatin] Other (See Comments) 08/26/2017  . Statins Other (See Comments) 08/01/2019    Family History  Problem Relation Age of Onset  . Asthma Mother   . Hypertension Father   . Colon cancer Neg Hx     Social History   Socioeconomic History  . Marital status: Widowed    Spouse name: Cherly   . Number of children: 1  . Years of education: Not on file  . Highest education level: 12th grade  Occupational History    Comment: Retired   Tobacco Use  . Smoking status: Never Smoker  . Smokeless tobacco: Never Used  Substance and Sexual Activity  . Alcohol use: No  . Drug use: No  . Sexual activity: Not Currently  Other Topics Concern  . Not on file  Social History Narrative   Live alone right now, wife passed in 2009    [place] 2 live in [place]      Enjoys playing golf      Diet: eats what he likes, sweet, fast food, does not eat a lot of canned or boxed foods.    Does not eat enough fruits and veggies.       Caffeine: 1 cup daily   Water: 3-4 bottles of water daily,  Gatorade every now and then         Wears seat belt   Wears sun protection when golfing   Smoke detectors at home   Does not use phone while driving   Social Determinants of Health   Financial Resource Strain: Low Risk   . Difficulty of Paying Living Expenses: Not hard at all  Food Insecurity: No Food Insecurity  . Worried About Charity fundraiser in the Last Year: Never true  . Ran Out of Food in the Last Year: Never true  Transportation Needs: No Transportation Needs  . Lack of Transportation (Medical): No  . Lack of Transportation (Non-Medical):  No  Physical Activity: Insufficiently Active  . Days of Exercise per Week: 5 days  . Minutes of Exercise per Session: 20 min  Stress: Stress Concern Present  . Feeling of Stress : Rather much  Social Connections: Somewhat Isolated  . Frequency of Communication with Friends and Family: More than three times a week  . Frequency of Social Gatherings with Friends and Family: More than three times a week  . Attends Religious Services: More than 4 times per year  . Active Member of Clubs or Organizations: No  . Attends Archivist Meetings: Never  . Marital Status: Widowed  Intimate Partner Violence: Not At Risk  . Fear of Current or Ex-Partner: No  . Emotionally Abused: No  . Physically Abused: No  . Sexually Abused: No    Review of Systems: General: Negative for anorexia, weight loss, fever, chills, fatigue, weakness. ENT: Negative for hoarseness, difficulty swallowing. CV: Negative for chest pain, angina, palpitations, peripheral edema.  Respiratory: Negative for dyspnea at rest, cough, sputum, wheezing.  GI: See history of present illness.  MS: Negative for joint pain, low back pain.  Derm: Negative for rash or itching.  Endo: Negative for unusual weight change.  Heme: Negative for bruising or bleeding. Allergy: Negative for rash or hives.    Physical Exam: BP (!) 148/88   Pulse 89   Temp (!) 96.6 F (35.9 C)  (Temporal)   Ht 5' 6"  (1.676 m)   Wt 210 lb 6.4 oz (95.4 kg)   BMI 33.96 kg/m  General:   Alert and oriented. Pleasant and cooperative. Well-nourished and well-developed.  Head:  Normocephalic and atraumatic. Eyes:  Without icterus, sclera clear and conjunctiva pink.  Ears:  Normal auditory acuity. Cardiovascular:  S1, S2 present without murmurs appreciated. Extremities without clubbing or edema. Respiratory:  Clear to auscultation bilaterally. No wheezes, rales, or rhonchi. No distress.  Gastrointestinal:  +BS, soft, non-tender and non-distended. No HSM noted. No guarding or rebound. No masses appreciated.  Rectal:  Deferred  Musculoskalatal:  Symmetrical without gross deformities. Neurologic:  Alert and oriented x4;  grossly normal neurologically. Psych:  Alert and cooperative. Normal mood and affect. Heme/Lymph/Immune: No excessive bruising noted.    09/21/2019 8:34 AM   Disclaimer: This note was dictated with voice recognition software. Similar sounding words can inadvertently be transcribed and may not be corrected upon review.

## 2019-09-21 NOTE — Assessment & Plan Note (Signed)
The patient presents complaining of a single episode of rectal bleeding which was felt to be "quite a lot, enough for me to go to the emergency room."  This occurred about a month ago.  His last colonoscopy was in 2014 and essentially a clean exam with recommended repeat in 10 years.  It has been 7 years since his last colonoscopy (which documented hemorrhoids).  His episode of bleeding was while taking a bath, not associated with a bowel movement.  No overt constipation.  At this point given the amount of time since his last colonoscopy and his symptomatic rectal bleeding we will proceed with a colonoscopy to further evaluate.  No family history of colon cancer, no personal history of colon polyps.  I feel this is likely a benign anorectal source, however cannot rule out more insidious pathology.  Proceed with TCS on propofol/MAC with Dr. Gala Romney in near future: the risks, benefits, and alternatives have been discussed with the patient in detail. The patient states understanding and desires to proceed.  The patient is on Wellbutrin and neurontin.  No other anticoagulants, anxiolytics, chronic pain medications, antidepressants, antidiabetics, or iron supplements.  We will plan for his procedure on propofol/MAC to promote adequate sedation.

## 2019-09-21 NOTE — Telephone Encounter (Signed)
PA for TCS submitted via [employer] website. Case approved. [employer]# [id number], valid 12/14/19-01/13/20.

## 2019-09-21 NOTE — Patient Instructions (Signed)
Your health issues we discussed today were:   Rectal bleeding: 1. As discussed, we will schedule you for a colonoscopy 2. Further recommendations will be made after colonoscopy 3. Call us if you have any worsening or severe rectal bleeding  Overall I recommend:  1. Continue your other current medications 2. Return for follow-up in 3 months 3. Call us if you have any questions or concerns.   ---------------------------------------------------------------  I am so glad that you got both of your coronavirus vaccines!  Keep doing the "3 diabetes": Wear your mask, wash your hands, weight 6 feet apart from other people (social distancing).  ---------------------------------------------------------------   At [facility] we value your feedback. You may receive a survey about your visit today. Please share your experience as we strive to create trusting relationships with our patients to provide genuine, compassionate, quality care.  We appreciate your understanding and patience as we review any laboratory studies, imaging, and other diagnostic tests that are ordered as we care for you. Our office policy is 5 business days for review of these results, and any emergent or urgent results are addressed in a timely manner for your best interest. If you do not hear from our office in 1 week, please contact us.   We also encourage the use of MyChart, which contains your medical information for your review as well. If you are not enrolled in this feature, an access code is on this after visit summary for your convenience. Thank you for allowing [place] to be involved in your care.  It was great to see you today!  I hope you have a great day!!

## 2019-09-25 ENCOUNTER — Other Ambulatory Visit: Payer: Self-pay

## 2019-09-25 ENCOUNTER — Telehealth: Payer: Self-pay

## 2019-09-25 DIAGNOSIS — I10 Essential (primary) hypertension: Secondary | ICD-10-CM

## 2019-09-25 MED ORDER — KLOR-CON M20 20 MEQ PO TBCR: 20.0000 meq | EXTENDED_RELEASE_TABLET | Freq: Every day | ORAL | 1 refills | Status: DC

## 2019-09-25 NOTE — Telephone Encounter (Signed)
Pt is calling --saying that he is having trouble getting his K[place] Con, please send in RX

## 2019-09-25 NOTE — Telephone Encounter (Signed)
Med sent.

## 2019-09-26 DIAGNOSIS — M5442 Lumbago with sciatica, left side: Secondary | ICD-10-CM | POA: Diagnosis not present

## 2019-09-26 DIAGNOSIS — M5441 Lumbago with sciatica, right side: Secondary | ICD-10-CM | POA: Diagnosis not present

## 2019-09-26 DIAGNOSIS — M48061 Spinal stenosis, lumbar region without neurogenic claudication: Secondary | ICD-10-CM | POA: Diagnosis not present

## 2019-09-26 DIAGNOSIS — M5416 Radiculopathy, lumbar region: Secondary | ICD-10-CM | POA: Diagnosis not present

## 2019-09-28 ENCOUNTER — Telehealth: Payer: Self-pay | Admitting: Nurse Practitioner

## 2019-09-28 ENCOUNTER — Other Ambulatory Visit: Payer: Self-pay | Admitting: Student

## 2019-09-28 DIAGNOSIS — M48061 Spinal stenosis, lumbar region without neurogenic claudication: Secondary | ICD-10-CM

## 2019-09-28 NOTE — Telephone Encounter (Signed)
Phone call to patient to verify medication list and allergies for myelogram procedure. Pt instructed to hold wellbutrin for 48hrs prior to myelogram appointment time. Pt verbalized understanding. Pre and post procedure instructions reviewed with pt.

## 2019-10-04 ENCOUNTER — Ambulatory Visit
Admission: RE | Admit: 2019-10-04 | Discharge: 2019-10-04 | Disposition: A | Discharge status: Home / Self Care | Payer: Medicare HMO | Source: Ambulatory Visit | Attending: Student | Admitting: Student

## 2019-10-04 ENCOUNTER — Other Ambulatory Visit: Payer: Self-pay

## 2019-10-04 DIAGNOSIS — M48061 Spinal stenosis, lumbar region without neurogenic claudication: Secondary | ICD-10-CM

## 2019-10-04 DIAGNOSIS — M5126 Other intervertebral disc displacement, lumbar region: Secondary | ICD-10-CM | POA: Diagnosis not present

## 2019-10-04 MED ORDER — IOPAMIDOL (ISOVUE-M 200) INJECTION 41%
15.0000 mL | Freq: Once | INTRAMUSCULAR | Status: AC
  Administered 2019-10-04: 15 mL via INTRATHECAL

## 2019-10-04 MED ORDER — DIAZEPAM 5 MG PO TABS
5.0000 mg | ORAL_TABLET | Freq: Once | ORAL | Status: AC
  Administered 2019-10-04: 5 mg via ORAL

## 2019-10-04 NOTE — Progress Notes (Signed)
Patient states he has been off Wellbutrin for at least the past two days.

## 2019-10-04 NOTE — Discharge Instructions (Signed)
Myelogram Discharge Instructions  1. Go home and rest quietly for the next 24 hours.  It is important to lie flat for the next 24 hours.  Get up only to go to the restroom.  You may lie in the bed or on a couch on your back, your stomach, your left side or your right side.  You may have one pillow under your head.  You may have pillows between your knees while you are on your side or under your knees while you are on your back.  2. DO NOT drive today.  Recline the seat as far back as it will go, while still wearing your seat belt, on the way home.  3. You may get up to go to the bathroom as needed.  You may sit up for 10 minutes to eat.  You may resume your normal diet and medications unless otherwise indicated.  Drink plenty of extra fluids today and tomorrow.  4. The incidence of a spinal headache with nausea and/or vomiting is about 5% (one in 20 patients).  If you develop a headache, lie flat and drink plenty of fluids until the headache goes away.  Caffeinated beverages may be helpful.  If you develop severe nausea and vomiting or a headache that does not go away with flat bed rest, call [phone].  5. You may resume normal activities after your 24 hours of bed rest is over; however, do not exert yourself strongly or do any heavy lifting tomorrow.  6. Call your physician for a follow-up appointment.    You may resume Wellbutrin on Thursday, October 05, 2019 after 10:30a.m.

## 2019-10-12 ENCOUNTER — Encounter: Payer: Self-pay | Admitting: Cardiovascular Disease

## 2019-10-12 DIAGNOSIS — E7800 Pure hypercholesterolemia, unspecified: Secondary | ICD-10-CM | POA: Diagnosis not present

## 2019-10-12 DIAGNOSIS — I10 Essential (primary) hypertension: Secondary | ICD-10-CM | POA: Diagnosis not present

## 2019-10-12 DIAGNOSIS — Z79899 Other long term (current) drug therapy: Secondary | ICD-10-CM | POA: Diagnosis not present

## 2019-10-16 ENCOUNTER — Telehealth: Payer: Self-pay | Admitting: Cardiovascular Disease

## 2019-10-16 NOTE — Telephone Encounter (Signed)
Will discuss tomorrow. 

## 2019-10-16 NOTE — Telephone Encounter (Signed)
  Pt c/o Shortness Of Breath: STAT if SOB developed within the last 24 hours or pt is noticeably SOB on the phone  1. Are you currently SOB (can you hear that pt is SOB on the phone)? no  2. How long have you been experiencing SOB? Few days  3. Are you SOB when sitting or when up moving around? Moving around  4. Are you currently experiencing any other symptoms? Worse today   Patient states this morning at around 1 am he had a pain in his neck, shoulder, and right arm. He states it is sore now, but not throbbing. He states he does not have any chest pain and has not checked his BP, but last week it was fine. He also states he has been nauseas when eating then it goes away. He states his SOB has been going on for the past few days when he moves around, but is worse today. He has an appointment tomorrow and would like to know if he should be seen today. Please advise.

## 2019-10-16 NOTE — Telephone Encounter (Signed)
Noted  

## 2019-10-16 NOTE — Telephone Encounter (Signed)
Contacted patient- he states that last night he was laying in bed and the pain in his ride side neck, arm and shoulder area so bad that he had to get up and sit in the recliner chair- which did help the pain. Patient states he has no chest pain, left sided pain but does mention being short winded and having some SOB, he denies swelling in his extremities currently feeling okay. Just feels sore.  I advised we did not have any openings for today- patient has appointment for tomorrow with Dr.C, but I did advise patient if the pain worsened, moved over into the left side of his chest/arm to go to ER, or if the breathing worsened. Patient verbalized understanding.  I also advised I would let MD know, and would call back if any further recommendations.  Patient thankful for call.

## 2019-10-17 ENCOUNTER — Other Ambulatory Visit: Payer: Self-pay

## 2019-10-17 ENCOUNTER — Ambulatory Visit: Payer: Medicare HMO | Admitting: Cardiovascular Disease

## 2019-10-17 ENCOUNTER — Encounter: Payer: Self-pay | Admitting: Cardiovascular Disease

## 2019-10-17 VITALS — BP 132/78 | HR 73 | Ht 66.0 in | Wt 214.4 lb

## 2019-10-17 DIAGNOSIS — I2510 Atherosclerotic heart disease of native coronary artery without angina pectoris: Secondary | ICD-10-CM | POA: Diagnosis not present

## 2019-10-17 DIAGNOSIS — E669 Obesity, unspecified: Secondary | ICD-10-CM | POA: Diagnosis not present

## 2019-10-17 DIAGNOSIS — I5032 Chronic diastolic (congestive) heart failure: Secondary | ICD-10-CM

## 2019-10-17 DIAGNOSIS — E1169 Type 2 diabetes mellitus with other specified complication: Secondary | ICD-10-CM

## 2019-10-17 DIAGNOSIS — E7801 Familial hypercholesterolemia: Secondary | ICD-10-CM

## 2019-10-17 DIAGNOSIS — I10 Essential (primary) hypertension: Secondary | ICD-10-CM | POA: Diagnosis not present

## 2019-10-17 NOTE — Progress Notes (Signed)
[name], male   DOB: [birth year], 79 y.o.   MRN: [id number]    Cardiology Office Note    Date:  10/18/2019   ID:  Douglas Keller, DOB [birth year], MRN [id number]  PCP:  Perlie Mayo, NP  Cardiologist:   Sanda Klein, MD   Chief Complaint  Patient presents with  . Hypertension    History of Present Illness:  Douglas Keller is a 79 y.o. male with hyperlipidemia, hypertension, moderate coronary atherosclerosis and a murmur due to aortic valve sclerosis without stenosis.  He has had a lot of problems with back pain and is here.  We reviewed his lumbar myelogram report from a couple of weeks ago today.  Yesterday he also had a lot of problems with pain on the right side of his neck radiating into the right shoulder and arm but this has improved.  He has not been able to play golf since last October.  In parallel, his activity level has decreased and he has less energy.  We had to titrate his blood pressure medications of, also likely due to his activity.  His blood pressure is now well controlled on maximum doses of amlodipine and benazepril, as well as a combination of spironolactone and hydrochlorothiazide.  He had muscle cramps 1 on chlorthalidone monotherapy but is tolerating the current diuretic regimen well.  The patient specifically denies any chest pain at rest exertion, dyspnea at rest or with exertion, orthopnea, paroxysmal nocturnal dyspnea, syncope, palpitations, focal neurological deficits, intermittent claudication, lower extremity edema, unexplained weight gain, cough, hemoptysis or wheezing.  An event monitor in November 2018 showed relative bradycardia and a suggestion of chronotropic incompetence.  He did not have any symptoms and a pacemaker was not necessary.  His echocardiogram in 2020 showed borderline LVEF of 45-50% (similar to EF 48% by nuclear stress test in 2016) and impaired relaxation  He was intolerant of multiple statins even when we tried to  supplement coenzyme Q 10.  He is now on Repatha and tolerating it very well.  Cardiac catheterization in 2006 showed  non obstructive atherosclerosis (40% proximal LAD, 40% mid RCA) and had nuclear stress test in 2018 showed normal perfusion, LVEF was only 48% .  Normal ABI bilaterally February 2020  Past Medical History:  Diagnosis Date  . Aortic valve sclerosis   . Atherosclerosis of coronary artery without angina pectoris 07/05/2014  . Cervical spondylosis without myelopathy 02/13/2013  . Chest pain 01/29/2019  . Degeneration of cervical intervertebral disc 02/13/2013  . Diabetes mellitus type 2, diet-controlled ([place])   . Elevated troponin   . HTN (hypertension) 07/06/2013  . Hyperlipidemia 07/06/2013    Past Surgical History:  Procedure Laterality Date  . CERVICAL SPINE SURGERY    . COLONOSCOPY  07/26/2012   Procedure: COLONOSCOPY;  Surgeon: Jamesetta So, MD;  Location: [facility];  Service: Gastroenterology;  Laterality: N/A;  . KNEE ARTHROSCOPY      Outpatient Medications Prior to Visit  Medication Sig Dispense Refill  . Accu-Chek Softclix Lancets lancets Use to test blood sugar once daily 100 each 0  . acetaminophen (TYLENOL) 650 MG CR tablet Take 1,300 mg by mouth 2 (two) times daily.    . Alcohol Swabs (B-D SINGLE USE SWABS REGULAR) PADS     . amLODipine (NORVASC) 10 MG tablet Take 1 tablet (10 mg total) by mouth daily. 90 tablet 1  . aspirin EC 81 MG tablet Take 81 mg by mouth daily.    . benazepril (  LOTENSIN) 40 MG tablet Take 1 tablet (40 mg total) by mouth daily. 90 tablet 1  . Blood Glucose Calibration (ACCU-CHEK AVIVA) SOLN     . blood glucose meter kit and supplies Dispense based on patient and insurance preference. Use to test blood sugar once daily. (FOR ICD-10 E10.9, E11.9). 1 each 0  . buPROPion (WELLBUTRIN SR) 150 MG 12 hr tablet Take 1 tablet (150 mg total) by mouth daily. 90 tablet 1  . Evolocumab (REPATHA SURECLICK) 604 MG/ML SOAJ Inject 140 mg into the  skin every 14 (fourteen) days. 2 pen 11  . gabapentin (NEURONTIN) 300 MG capsule Take 1 capsule (300 mg total) by mouth 3 (three) times daily. 21 capsule 0  . GLUCOSAMINE PO Take 1 tablet by mouth at bedtime.    Marland Kitchen glucose blood test strip Use to test blood sugar once daily 100 each 0  . hydrOXYzine (ATARAX/VISTARIL) 10 MG tablet Take 1 tablet (10 mg total) by mouth at bedtime. 30 tablet 0  . KLOR-CON M20 20 MEQ tablet Take 1 tablet (20 mEq total) by mouth daily. 90 tablet 1  . meclizine (ANTIVERT) 12.5 MG tablet Take 1 tablet (12.5 mg total) by mouth 2 (two) times daily as needed for dizziness. 30 tablet 0  . Multiple Vitamins-Minerals (AIRBORNE PO) Take 1 each by mouth daily.    . pantoprazole (PROTONIX) 40 MG tablet Take 1 tablet (40 mg total) by mouth daily. 90 tablet 1  . polyethylene glycol-electrolytes (TRILYTE) 420 g solution Take 4,000 mLs by mouth as directed. 4000 mL 0  . Magnesium Oxide 400 MG CAPS Take 1 capsule (400 mg total) by mouth daily. 30 capsule 11  . spironolactone-hydrochlorothiazide (ALDACTAZIDE) 25-25 MG tablet Take 1 tablet by mouth daily. 30 tablet 1   No facility-administered medications prior to visit.     Allergies:   Statins   Social History   Socioeconomic History  . Marital status: Widowed    Spouse name: Cherly   . Number of children: 1  . Years of education: Not on file  . Highest education level: 12th grade  Occupational History    Comment: Retired   Tobacco Use  . Smoking status: Never Smoker  . Smokeless tobacco: Never Used  Substance and Sexual Activity  . Alcohol use: No  . Drug use: No  . Sexual activity: Not Currently  Other Topics Concern  . Not on file  Social History Narrative   Live alone right now, wife passed in 2009    [place] 2 live in [place]      Enjoys playing golf      Diet: eats what he likes, sweet, fast food, does not eat a lot of canned or boxed foods.    Does not eat enough fruits and veggies.        Caffeine: 1 cup daily   Water: 3-4 bottles of water daily, Gatorade every now and then         Wears seat belt   Wears sun protection when golfing   Smoke detectors at home   Does not use phone while driving   Social Determinants of Health   Financial Resource Strain: Low Risk   . Difficulty of Paying Living Expenses: Not hard at all  Food Insecurity: No Food Insecurity  . Worried About Charity fundraiser in the Last Year: Never true  . Ran Out of Food in the Last Year: Never true  Transportation Needs: No Transportation Needs  . Lack  of Transportation (Medical): No  . Lack of Transportation (Non-Medical): No  Physical Activity: Insufficiently Active  . Days of Exercise per Week: 5 days  . Minutes of Exercise per Session: 20 min  Stress: Stress Concern Present  . Feeling of Stress : Rather much  Social Connections: Somewhat Isolated  . Frequency of Communication with Friends and Family: More than three times a week  . Frequency of Social Gatherings with Friends and Family: More than three times a week  . Attends Religious Services: More than 4 times per year  . Active Member of Clubs or Organizations: No  . Attends Archivist Meetings: Never  . Marital Status: Widowed     Family History:  The patient's family history includes Asthma in his mother; Hypertension in his father.   ROS:   Please see the history of present illness.    ROS .mcros   PHYSICAL EXAM:   VS:  BP 132/78   Pulse 73   Ht _0  (1.676 m)   Wt 214 lb 6.4 oz (97.3 kg)   SpO2 96%   BMI 34.61 kg/m     General: Alert, oriented x3, no distress, moderately obese Head: no evidence of trauma, PERRL, EOMI, no exophtalmos or lid lag, no myxedema, no xanthelasma; normal ears, nose and oropharynx Neck: normal jugular venous pulsations and no hepatojugular reflux; brisk carotid pulses without delay and no carotid bruits Chest: clear to auscultation, no signs of consolidation by percussion or  palpation, normal fremitus, symmetrical and full respiratory excursions Cardiovascular: normal position and quality of the apical impulse, regular rhythm, normal first and second heart sounds, early peaking 2/6 aortic ejection murmur, no diastolic murmurs, S4 gallop present Abdomen: no tenderness or distention, no masses by palpation, no abnormal pulsatility or arterial bruits, normal bowel sounds, no hepatosplenomegaly Extremities: no clubbing, cyanosis or edema; 2+ radial, ulnar and brachial pulses bilaterally; 2+ right femoral, posterior tibial and dorsalis pedis pulses; 2+ left femoral, posterior tibial and dorsalis pedis pulses; no subclavian or femoral bruits Neurological: grossly nonfocal Psych: Normal mood and affect   Wt Readings from Last 3 Encounters:  10/17/19 214 lb 6.4 oz (97.3 kg)  09/21/19 210 lb 6.4 oz (95.4 kg)  09/05/19 208 lb (94.3 kg)      Studies/Labs Reviewed:   EKG:  EKG is not ordered today.  Reviewed the tracing from 08/15/2019 which shows sinus rhythm and delayed R wave progression, but no ischemic repolarization abnormalities. Recent Labs: 01/29/2019: TSH 2.351 08/17/2019: ALT 24; Hemoglobin 13.7; Platelets 295 09/08/2019: BUN 13; Creatinine, Ser 1.27; Magnesium 2.3; Potassium 4.2; Sodium 142   Lipid Panel    Component Value Date/Time   CHOL 142 06/02/2019 0814   TRIG 112 06/02/2019 0814   HDL 55 06/02/2019 0814   CHOLHDL 2.6 06/02/2019 0814   CHOLHDL 3.3 01/29/2019 0800   VLDL 30 01/29/2019 0800   LDLCALC 67 06/02/2019 0814     ASSESSMENT:    1. Essential hypertension   2. Heterozygous familial hypercholesterolemia   3. Coronary artery disease involving native coronary artery of native heart without angina pectoris   4. Chronic diastolic heart failure ([place])   5. Diabetes mellitus type 2 in obese [facility])      PLAN:  In order of problems listed above:  1. HTN: Well-controlled on 4 agents (amlodipine, benazepril, spironolactone,  hydrochlorothiazide).  Avoid beta-blockers due to symptomatic chronotropic incompetence. 2. 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  Excellent response to Repatha. 3. CAD: He does not have  angina pectoris.  Asymptomatic, nonobstructive but widespread atherosclerotic lesions at the time of previous coronary angiography. 4. CHF: mildly reduced LVEF. Without signs or symptoms of congestive heart failure, but with echo Doppler evidence of elevated filling pressure.  Likely blood pressure related. 5. DM/obesity: Sugars well controlled with a recent hemoglobin A1c of 6.5%.  Weight loss would be highly beneficial, but his ability to be physically active has been limited by his lumbar spine problems. 6. Bilateral leg pain: Has lumbar spine disease and neurogenic claudication.  He had normal ABI , February 2020.   Medication Adjustments/Labs and Tests Ordered: Current medicines are reviewed at length with the patient today.  Concerns regarding medicines are outlined above.  Medication changes, Labs and Tests ordered today are listed in the Patient Instructions below. Patient Instructions  Medication Instructions:  No changes *If you need a refill on your cardiac medications before your next appointment, please call your pharmacy*   Lab Work: None ordered If you have labs (blood work) drawn today and your tests are completely normal, you will receive your results only by: Marland Kitchen MyChart Message (if you have MyChart) OR . A paper copy in the mail If you have any lab test that is abnormal or we need to change your treatment, we will call you to review the results.   Testing/Procedures: None ordered   Follow-Up: At [facility], you and your health needs are our priority.  As part of our continuing mission to provide you with exceptional heart care, we have created designated Provider Care Teams.  These Care Teams include your primary Cardiologist (physician) and Advanced Practice  Providers (APPs -  Physician Assistants and Nurse Practitioners) who all work together to provide you with the care you need, when you need it.  We recommend signing up for the patient portal called "MyChart".  Sign up information is provided on this After Visit Summary.  MyChart is used to connect with patients for Virtual Visits (Telemedicine).  Patients are able to view lab/test results, encounter notes, upcoming appointments, etc.  Non-urgent messages can be sent to your provider as well.   To learn more about what you can do with MyChart, go to [email or web address].    Your next appointment:   12 month(s)  The format for your next appointment:   In Person  Provider:   You may see Sanda Klein, MD or one of the following Advanced Practice Providers on your designated Care Team:    Almyra Deforest, PA-C  Fabian Sharp, [place] or   Roby Lofts, PA-C         Signed, Sanda Klein, MD  10/18/2019 1:10 PM    [facility] [address], [place], [place]  [address] Phone: [phone]; Fax: [phone]

## 2019-10-17 NOTE — Patient Instructions (Signed)

## 2019-10-18 ENCOUNTER — Other Ambulatory Visit: Payer: Self-pay | Admitting: Cardiovascular Disease

## 2019-10-18 ENCOUNTER — Encounter: Payer: Self-pay | Admitting: Cardiovascular Disease

## 2019-10-18 MED ORDER — SPIRONOLACTONE-HCTZ 25-25 MG PO TABS: 1.0000 | ORAL_TABLET | Freq: Every day | ORAL | 3 refills | Status: DC

## 2019-10-18 MED ORDER — MAGNESIUM OXIDE 400 MG PO CAPS: 400.0000 mg | ORAL_CAPSULE | Freq: Every day | ORAL | 3 refills | Status: DC

## 2019-10-18 NOTE — Telephone Encounter (Signed)
*  STAT* If patient is at the pharmacy, call can be transferred to refill team.   1. Which medications need to be refilled? (please list name of each medication and dose if known)  spironolactone-hydrochlorothiazide (ALDACTAZIDE) 25-25 MG tablet Magnesium Oxide 400 MG CAPS  2. Which pharmacy/location (including street and city if local pharmacy) is medication to be sent to?   [facility] [place], [place]  3. Do they need a 30 day or 90 day supply? 90 with refills  Patient said he had these rx sent to [facility] when he was just trying them out. Now that he knows they work and he will be on them long term, he would like them to go to [facility]

## 2019-11-22 ENCOUNTER — Telehealth: Payer: Self-pay

## 2019-11-22 NOTE — Telephone Encounter (Signed)
Pt advised of recommendation with verbal understanding  

## 2019-11-22 NOTE — Telephone Encounter (Signed)
I do not think he needs to take this, his labs were good overall last check. Iron is something you do no want excess amounts of. I would avoid it unless needed.

## 2019-11-22 NOTE — Telephone Encounter (Signed)
Pt wanting to know if it would be ok to take iron once a day

## 2019-11-22 NOTE — Telephone Encounter (Signed)
Pt is calling to see if he can start taking iron pills

## 2019-11-27 DIAGNOSIS — M48062 Spinal stenosis, lumbar region with neurogenic claudication: Secondary | ICD-10-CM | POA: Diagnosis not present

## 2019-11-28 ENCOUNTER — Other Ambulatory Visit: Payer: Self-pay | Admitting: Neurosurgery

## 2019-11-29 ENCOUNTER — Other Ambulatory Visit: Payer: Self-pay

## 2019-11-29 ENCOUNTER — Ambulatory Visit (INDEPENDENT_AMBULATORY_CARE_PROVIDER_SITE_OTHER): Payer: Medicare HMO | Admitting: Family Medicine

## 2019-11-29 ENCOUNTER — Encounter: Payer: Self-pay | Admitting: Family Medicine

## 2019-11-29 ENCOUNTER — Other Ambulatory Visit: Payer: Self-pay | Admitting: Neurosurgery

## 2019-11-29 VITALS — BP 130/80 | HR 82 | Temp 97.4°F | Resp 15 | Ht 66.0 in | Wt 213.0 lb

## 2019-11-29 DIAGNOSIS — Z23 Encounter for immunization: Secondary | ICD-10-CM | POA: Diagnosis not present

## 2019-11-29 DIAGNOSIS — E1169 Type 2 diabetes mellitus with other specified complication: Secondary | ICD-10-CM | POA: Diagnosis not present

## 2019-11-29 DIAGNOSIS — I10 Essential (primary) hypertension: Secondary | ICD-10-CM

## 2019-11-29 DIAGNOSIS — M48061 Spinal stenosis, lumbar region without neurogenic claudication: Secondary | ICD-10-CM

## 2019-11-29 DIAGNOSIS — E785 Hyperlipidemia, unspecified: Secondary | ICD-10-CM | POA: Diagnosis not present

## 2019-11-29 DIAGNOSIS — E669 Obesity, unspecified: Secondary | ICD-10-CM

## 2019-11-29 NOTE — Assessment & Plan Note (Signed)
Deteriorated, Douglas Keller is re-educated about the importance of exercise daily to help with weight management. A minumum of 30 minutes daily is recommended. Additionally, importance of healthy food choices  with portion control discussed.   Wt Readings from Last 3 Encounters:  11/29/19 213 lb (96.6 kg)  10/17/19 214 lb 6.4 oz (97.3 kg)  09/21/19 210 lb 6.4 oz (95.4 kg)

## 2019-11-29 NOTE — Assessment & Plan Note (Signed)
Has upcoming surgery on June 9, will need surgical clearance.  Have not seen a form yet to provide this.  Advised for him to contact the office to see if they need to send [place] a form.

## 2019-11-29 NOTE — Assessment & Plan Note (Signed)
Douglas Keller is encouraged to check blood sugar daily as directed. Continue current medications. Intolerant of statin is on Repatha Educated on importance of maintain a well balanced diabetic friendly diet.  He is reminded the importance of maintaining  good blood sugars,  taking medications as directed, daily foot care, annual eye exams. Additionally educated about keeping good control over blood pressure and cholesterol as well.

## 2019-11-29 NOTE — Progress Notes (Signed)
Subjective:  [name], male    DOB: 1940-11-24  Age: 79 y.o. MRN: [id number]  CC:  Chief Complaint  Patient presents with  . Hypertension    follow up visit       HPI  HPI  Douglas Keller is a 79 year old male patient of mine.  Who presents today for follow-up on blood pressure in addition to reporting his back pain. He is going to be having back surgery here soon.  We will need to check in on surgical clearance for this.  We will need to get Covid testing.  Stop his aspirin 5 days before.  In addition might need updated lab work, urinary analysis and EKG.  He reports that he thought the form was sent over but we might need to double back and verify this.  As I have not seen one today.  He reports that his lower back and radiation down into the right leg mostly is chronic.  Constantly aches when he is up walking or standing, pain usually goes away once he sitting down.  Described as a pulling sensation.  Sitting down relieves it.  Walking and standing aggravates it.  Constant with walking.  7 out of 10 today declines having any injections done today.  Reports that he takes Tylenol twice daily as needed.  Today patient denies signs and symptoms of COVID 19 infection including fever, chills, cough, shortness of breath, and headache. Past Medical, Surgical, Social History, Allergies, and Medications have been Reviewed.   Past Medical History:  Diagnosis Date  . Aortic valve sclerosis   . Atherosclerosis of coronary artery without angina pectoris 07/05/2014  . Cervical spondylosis without myelopathy 02/13/2013  . Chest pain 01/29/2019  . Degeneration of cervical intervertebral disc 02/13/2013  . Diabetes mellitus type 2, diet-controlled ([place])   . Elevated troponin   . HTN (hypertension) 07/06/2013  . Hypercholesterolemia 08/21/2015  . Hyperlipidemia 07/06/2013  . Stiffness of left shoulder joint 07/19/2013    Current Meds  Medication Sig  . Accu-Chek Softclix  Lancets lancets Use to test blood sugar once daily  . acetaminophen (TYLENOL) 650 MG CR tablet Take 1,300 mg by mouth 2 (two) times daily.  . Alcohol Swabs (B-D SINGLE USE SWABS REGULAR) PADS   . amLODipine (NORVASC) 10 MG tablet Take 1 tablet (10 mg total) by mouth daily.  Marland Kitchen aspirin EC 81 MG tablet Take 81 mg by mouth daily.  . benazepril (LOTENSIN) 40 MG tablet Take 1 tablet (40 mg total) by mouth daily.  . Blood Glucose Calibration (ACCU-CHEK AVIVA) SOLN   . blood glucose meter kit and supplies Dispense based on patient and insurance preference. Use to test blood sugar once daily. (FOR ICD-10 E10.9, E11.9).  Marland Kitchen buPROPion (WELLBUTRIN SR) 150 MG 12 hr tablet Take 1 tablet (150 mg total) by mouth daily.  . Evolocumab (REPATHA SURECLICK) 263 MG/ML SOAJ Inject 140 mg into the skin every 14 (fourteen) days.  Marland Kitchen gabapentin (NEURONTIN) 300 MG capsule Take 1 capsule (300 mg total) by mouth 3 (three) times daily.  Marland Kitchen GLUCOSAMINE PO Take 1 tablet by mouth at bedtime.  Marland Kitchen glucose blood test strip Use to test blood sugar once daily  . hydrOXYzine (ATARAX/VISTARIL) 10 MG tablet Take 1 tablet (10 mg total) by mouth at bedtime.  Marland Kitchen KLOR-CON M20 20 MEQ tablet Take 1 tablet (20 mEq total) by mouth daily.  . Magnesium Oxide 400 MG CAPS Take 1 capsule (400 mg total) by mouth daily.  Marland Kitchen  meclizine (ANTIVERT) 12.5 MG tablet Take 1 tablet (12.5 mg total) by mouth 2 (two) times daily as needed for dizziness.  . Multiple Vitamins-Minerals (AIRBORNE PO) Take 1 each by mouth daily.  . pantoprazole (PROTONIX) 40 MG tablet Take 1 tablet (40 mg total) by mouth daily.  . polyethylene glycol-electrolytes (TRILYTE) 420 g solution Take 4,000 mLs by mouth as directed. (Patient not taking: Reported on 11/29/2019)  . spironolactone-hydrochlorothiazide (ALDACTAZIDE) 25-25 MG tablet Take 1 tablet by mouth daily.    ROS:  Review of Systems  HENT: Negative.   Eyes: Negative.   Respiratory: Negative.   Cardiovascular: Negative.    Gastrointestinal: Negative.   Genitourinary: Negative.   Musculoskeletal: Positive for back pain.  Skin: Negative.   Neurological: Negative.   Endo/Heme/Allergies: Negative.   Psychiatric/Behavioral: Negative.   All other systems reviewed and are negative.    Objective:   Today's Vitals: BP 130/80   Pulse 82   Temp (!) 97.4 F (36.3 C) (Temporal)   Resp 15   Ht _0  (1.676 m)   Wt 213 lb (96.6 kg)   SpO2 95%   BMI 34.38 kg/m  Vitals with BMI 11/29/2019 10/17/2019 10/04/2019  Height _1  _2  -  Weight 213 lbs 214 lbs 6 oz -  BMI 59.9 35.70 -  Systolic 177 939 030  Diastolic 80 78 76  Pulse 82 73 64     Physical Exam Vitals and nursing note reviewed.  Constitutional:      Appearance: Normal appearance. He is obese.  HENT:     Head: Normocephalic and atraumatic.     Right Ear: External ear normal.     Left Ear: External ear normal.     Mouth/Throat:     Comments: Mask in place Eyes:     General:        Right eye: No discharge.        Left eye: No discharge.     Conjunctiva/sclera: Conjunctivae normal.  Cardiovascular:     Rate and Rhythm: Normal rate and regular rhythm.     Pulses: Normal pulses.     Heart sounds: Normal heart sounds.  Pulmonary:     Effort: Pulmonary effort is normal.     Breath sounds: Normal breath sounds.  Musculoskeletal:     Cervical back: Normal range of motion and neck supple.     Comments: There is no limited range of motion in the office today.  Skin:    General: Skin is warm.  Neurological:     General: No focal deficit present.     Mental Status: He is alert and oriented to person, place, and time.  Psychiatric:        Mood and Affect: Mood normal.        Behavior: Behavior normal.        Thought Content: Thought content normal.        Judgment: Judgment normal.      Assessment   1. Type 2 diabetes mellitus with hyperlipidemia ([place])   2. Essential hypertension   3. Obesity (BMI 30.0-34.9)   4. Spinal stenosis of  lumbar region, unspecified whether neurogenic claudication present     Tests ordered Orders Placed This Encounter  Procedures  . Pneumococcal polysaccharide vaccine 23-valent greater than or equal to 2yo subcutaneous/IM     Plan: Please see assessment and plan per problem list above.   No orders of the defined types were placed in this encounter.   Patient to follow-up  in 02/29/2020  Perlie Mayo, NP

## 2019-11-29 NOTE — Patient Instructions (Signed)
I appreciate the opportunity to provide you with care for your health and wellness. Today we discussed: upcoming surgery, blood pressure   Follow up: 3 months    No labs or referrals today  Will call your MD for surgery clearance form.  Please continue to practice social distancing to keep you, your family, and our community safe.  If you must go out, please wear a mask and practice good handwashing.  It was a pleasure to see you and I look forward to continuing to work together on your health and well-being. Please do not hesitate to call the office if you need care or have questions about your care.  Have a wonderful day and week. With Gratitude, Cherly Beach, DNP, AGNP-BC

## 2019-11-29 NOTE — Assessment & Plan Note (Signed)
Douglas Keller is encouraged to maintain a well balanced diet that is low in salt. Controlled, continue current medication regimen. .  Additionally, he is also reminded that exercise is beneficial for heart health and control of  Blood pressure. 30-60 minutes daily is recommended-walking was suggested.   

## 2019-11-30 ENCOUNTER — Other Ambulatory Visit: Payer: Self-pay | Admitting: *Deleted

## 2019-11-30 DIAGNOSIS — I10 Essential (primary) hypertension: Secondary | ICD-10-CM

## 2019-11-30 DIAGNOSIS — K219 Gastro-esophageal reflux disease without esophagitis: Secondary | ICD-10-CM

## 2019-11-30 MED ORDER — PANTOPRAZOLE SODIUM 40 MG PO TBEC: 40.0000 mg | DELAYED_RELEASE_TABLET | Freq: Every day | ORAL | 1 refills | Status: DC

## 2019-11-30 MED ORDER — AMLODIPINE BESYLATE 10 MG PO TABS: 10.0000 mg | ORAL_TABLET | Freq: Every day | ORAL | 1 refills | Status: DC

## 2019-12-01 ENCOUNTER — Other Ambulatory Visit: Payer: Self-pay | Admitting: *Deleted

## 2019-12-01 DIAGNOSIS — I10 Essential (primary) hypertension: Secondary | ICD-10-CM

## 2019-12-01 MED ORDER — BENAZEPRIL HCL 40 MG PO TABS: 40.0000 mg | ORAL_TABLET | Freq: Every day | ORAL | 1 refills | Status: DC

## 2019-12-08 NOTE — Patient Instructions (Signed)
Douglas Keller  12/08/2019     @PREFPERIOPPHARMACY @   Your procedure is scheduled on  12/14/2019.  Report to [facility] at  0730  A.M.  Call this number if you have problems the morning of surgery:  [phone]   Remember:  Follow the diet and prep instructions given to you by Dr Roseanne Kaufman office.                     Take these medicines the morning of surgery with A SIP OF WATER  Amlodipine, wellbutrin, gabapentin, hydroxyzine(if needed), antivert(if needed), protonix.    Do not wear jewelry, make-up or nail polish.  Do not wear lotions, powders, or perfumes. Please wear deodorant and brush your teeth.  Do not shave 48 hours prior to surgery.  Men may shave face and neck.  Do not bring valuables to the hospital.  [facility] is not responsible for any belongings or valuables.  Contacts, dentures or bridgework may not be worn into surgery.  Leave your suitcase in the car.  After surgery it may be brought to your room.  For patients admitted to the hospital, discharge time will be determined by your treatment team.  Patients discharged the day of surgery will not be allowed to drive home.   Name and phone number of your driver:   family Special instructions:  DO NOT smoke the morning of your procedure.  Please read over the following fact sheets that you were given. Anesthesia Post-op Instructions and Care and Recovery After Surgery       Colonoscopy, Adult, Care After This sheet gives you information about how to care for yourself after your procedure. Your health care provider may also give you more specific instructions. If you have problems or questions, contact your health care provider. What can I expect after the procedure? After the procedure, it is common to have:  A small amount of blood in your stool for 24 hours after the procedure.  Some gas.  Mild cramping or bloating of your abdomen. Follow these instructions at home: Eating and  drinking   Drink enough fluid to keep your urine pale yellow.  Follow instructions from your health care provider about eating or drinking restrictions.  Resume your normal diet as instructed by your health care provider. Avoid heavy or fried foods that are hard to digest. Activity  Rest as told by your health care provider.  Avoid sitting for a long time without moving. Get up to take short walks every 1-2 hours. This is important to improve blood flow and breathing. Ask for help if you feel weak or unsteady.  Return to your normal activities as told by your health care provider. Ask your health care provider what activities are safe for you. Managing cramping and bloating   Try walking around when you have cramps or feel bloated.  Apply heat to your abdomen as told by your health care provider. Use the heat source that your health care provider recommends, such as a moist heat pack or a heating pad. ? Place a towel between your skin and the heat source. ? Leave the heat on for 20-30 minutes. ? Remove the heat if your skin turns bright red. This is especially important if you are unable to feel pain, heat, or cold. You may have a greater risk of getting burned. General instructions  For the first 24 hours after the procedure: ? Do not drive or use machinery. ?  Do not sign important documents. ? Do not drink alcohol. ? Do your regular daily activities at a slower pace than normal. ? Eat soft foods that are easy to digest.  Take over-the-counter and prescription medicines only as told by your health care provider.  Keep all follow-up visits as told by your health care provider. This is important. Contact a health care provider if:  You have blood in your stool 2-3 days after the procedure. Get help right away if you have:  More than a small spotting of blood in your stool.  Large blood clots in your stool.  Swelling of your abdomen.  Nausea or vomiting.  A  fever.  Increasing pain in your abdomen that is not relieved with medicine. Summary  After the procedure, it is common to have a small amount of blood in your stool. You may also have mild cramping and bloating of your abdomen.  For the first 24 hours after the procedure, do not drive or use machinery, sign important documents, or drink alcohol.  Get help right away if you have a lot of blood in your stool, nausea or vomiting, a fever, or increased pain in your abdomen. This information is not intended to replace advice given to you by your health care provider. Make sure you discuss any questions you have with your health care provider. Document Revised: 01/30/2019 Document Reviewed: 01/30/2019 Elsevier Patient Education  [place] After These instructions provide you with information about caring for yourself after your procedure. Your health care provider may also give you more specific instructions. Your treatment has been planned according to current medical practices, but problems sometimes occur. Call your health care provider if you have any problems or questions after your procedure. What can I expect after the procedure? After your procedure, you may:  Feel sleepy for several hours.  Feel clumsy and have poor balance for several hours.  Feel forgetful about what happened after the procedure.  Have poor judgment for several hours.  Feel nauseous or vomit.  Have a sore throat if you had a breathing tube during the procedure. Follow these instructions at home: For at least 24 hours after the procedure:      Have a responsible adult stay with you. It is important to have someone help care for you until you are awake and alert.  Rest as needed.  Do not: ? Participate in activities in which you could fall or become injured. ? Drive. ? Use heavy machinery. ? Drink alcohol. ? Take sleeping pills or medicines that cause  drowsiness. ? Make important decisions or sign legal documents. ? Take care of children on your own. Eating and drinking  Follow the diet that is recommended by your health care provider.  If you vomit, drink water, juice, or soup when you can drink without vomiting.  Make sure you have little or no nausea before eating solid foods. General instructions  Take over-the-counter and prescription medicines only as told by your health care provider.  If you have sleep apnea, surgery and certain medicines can increase your risk for breathing problems. Follow instructions from your health care provider about wearing your sleep device: ? Anytime you are sleeping, including during daytime naps. ? While taking prescription pain medicines, sleeping medicines, or medicines that make you drowsy.  If you smoke, do not smoke without supervision.  Keep all follow-up visits as told by your health care provider. This is important. Contact a health  care provider if:  You keep feeling nauseous or you keep vomiting.  You feel light-headed.  You develop a rash.  You have a fever. Get help right away if:  You have trouble breathing. Summary  For several hours after your procedure, you may feel sleepy and have poor judgment.  Have a responsible adult stay with you for at least 24 hours or until you are awake and alert. This information is not intended to replace advice given to you by your health care provider. Make sure you discuss any questions you have with your health care provider. Document Revised: 10/04/2017 Document Reviewed: 10/27/2015 Elsevier Patient Education  [place].

## 2019-12-12 ENCOUNTER — Other Ambulatory Visit (HOSPITAL_COMMUNITY): Payer: Medicare HMO

## 2019-12-12 ENCOUNTER — Other Ambulatory Visit: Payer: Self-pay

## 2019-12-12 ENCOUNTER — Other Ambulatory Visit (HOSPITAL_COMMUNITY)
Admission: RE | Admit: 2019-12-12 | Discharge: 2019-12-12 | Disposition: A | Discharge status: Home / Self Care | Payer: Medicare HMO | Source: Ambulatory Visit | Attending: Internal Medicine | Admitting: Internal Medicine

## 2019-12-12 ENCOUNTER — Encounter (HOSPITAL_COMMUNITY)
Admission: RE | Admit: 2019-12-12 | Discharge: 2019-12-12 | Disposition: A | Discharge status: Home / Self Care | Payer: Medicare HMO | Source: Ambulatory Visit | Attending: Internal Medicine | Admitting: Internal Medicine

## 2019-12-12 ENCOUNTER — Encounter (HOSPITAL_COMMUNITY): Payer: Self-pay

## 2019-12-12 DIAGNOSIS — Z01812 Encounter for preprocedural laboratory examination: Secondary | ICD-10-CM | POA: Insufficient documentation

## 2019-12-12 DIAGNOSIS — Z20822 Contact with and (suspected) exposure to covid-19: Secondary | ICD-10-CM | POA: Diagnosis not present

## 2019-12-12 HISTORY — DX: Gastro-esophageal reflux disease without esophagitis: K21.9

## 2019-12-12 LAB — BASIC METABOLIC PANEL
Anion gap: 13 (ref 5–15)
BUN: 17 mg/dL (ref 8–23)
CO2: 21 mmol/L — ABNORMAL LOW (ref 22–32)
Calcium: 9 mg/dL (ref 8.9–10.3)
Chloride: 104 mmol/L (ref 98–111)
Creatinine, Ser: 1.23 mg/dL (ref 0.61–1.24)
GFR calc Af Amer: >60 mL/min (ref >60)
GFR calc non Af Amer: 56 mL/min — ABNORMAL LOW (ref >60)
Glucose, Bld: 151 mg/dL — ABNORMAL HIGH (ref 70–99)
Potassium: 3.8 mmol/L (ref 3.5–5.1)
Sodium: 138 mmol/L (ref 135–145)

## 2019-12-12 LAB — SARS CORONAVIRUS 2 (TAT 6-24 HRS): SARS Coronavirus 2: NEGATIVE

## 2019-12-14 ENCOUNTER — Encounter (HOSPITAL_COMMUNITY): Payer: Self-pay | Admitting: Internal Medicine

## 2019-12-14 ENCOUNTER — Ambulatory Visit (HOSPITAL_COMMUNITY): Payer: Medicare HMO | Admitting: Anesthesiology

## 2019-12-14 ENCOUNTER — Encounter (HOSPITAL_COMMUNITY)
Admission: RE | Disposition: A | Discharge status: Home / Self Care | Payer: Self-pay | Source: Home / Self Care | Attending: Internal Medicine

## 2019-12-14 ENCOUNTER — Other Ambulatory Visit: Payer: Self-pay

## 2019-12-14 ENCOUNTER — Ambulatory Visit (HOSPITAL_COMMUNITY)
Admission: RE | Admit: 2019-12-14 | Discharge: 2019-12-14 | Disposition: A | Discharge status: Home / Self Care | Payer: Medicare HMO | Attending: Internal Medicine | Admitting: Internal Medicine

## 2019-12-14 DIAGNOSIS — K640 First degree hemorrhoids: Secondary | ICD-10-CM | POA: Insufficient documentation

## 2019-12-14 DIAGNOSIS — K219 Gastro-esophageal reflux disease without esophagitis: Secondary | ICD-10-CM | POA: Insufficient documentation

## 2019-12-14 DIAGNOSIS — K633 Ulcer of intestine: Secondary | ICD-10-CM | POA: Diagnosis not present

## 2019-12-14 DIAGNOSIS — Z79899 Other long term (current) drug therapy: Secondary | ICD-10-CM | POA: Insufficient documentation

## 2019-12-14 DIAGNOSIS — Z7984 Long term (current) use of oral hypoglycemic drugs: Secondary | ICD-10-CM | POA: Insufficient documentation

## 2019-12-14 DIAGNOSIS — K625 Hemorrhage of anus and rectum: Secondary | ICD-10-CM | POA: Diagnosis not present

## 2019-12-14 DIAGNOSIS — K921 Melena: Secondary | ICD-10-CM | POA: Diagnosis not present

## 2019-12-14 DIAGNOSIS — I2510 Atherosclerotic heart disease of native coronary artery without angina pectoris: Secondary | ICD-10-CM | POA: Insufficient documentation

## 2019-12-14 DIAGNOSIS — F329 Major depressive disorder, single episode, unspecified: Secondary | ICD-10-CM | POA: Diagnosis not present

## 2019-12-14 DIAGNOSIS — K5900 Constipation, unspecified: Secondary | ICD-10-CM | POA: Insufficient documentation

## 2019-12-14 DIAGNOSIS — E7800 Pure hypercholesterolemia, unspecified: Secondary | ICD-10-CM | POA: Insufficient documentation

## 2019-12-14 DIAGNOSIS — E785 Hyperlipidemia, unspecified: Secondary | ICD-10-CM | POA: Diagnosis not present

## 2019-12-14 DIAGNOSIS — I10 Essential (primary) hypertension: Secondary | ICD-10-CM | POA: Insufficient documentation

## 2019-12-14 DIAGNOSIS — E119 Type 2 diabetes mellitus without complications: Secondary | ICD-10-CM | POA: Insufficient documentation

## 2019-12-14 DIAGNOSIS — Z7982 Long term (current) use of aspirin: Secondary | ICD-10-CM | POA: Insufficient documentation

## 2019-12-14 DIAGNOSIS — K639 Disease of intestine, unspecified: Secondary | ICD-10-CM | POA: Insufficient documentation

## 2019-12-14 HISTORY — PX: BIOPSY: SHX5522

## 2019-12-14 HISTORY — PX: COLONOSCOPY WITH PROPOFOL: SHX5780

## 2019-12-14 LAB — GLUCOSE, CAPILLARY: Glucose-Capillary: 133 mg/dL — ABNORMAL HIGH (ref 70–99)

## 2019-12-14 SURGERY — COLONOSCOPY WITH PROPOFOL
Anesthesia: General

## 2019-12-14 MED ORDER — PROPOFOL 500 MG/50ML IV EMUL
INTRAVENOUS | Status: DC | PRN
  Administered 2019-12-14: 150 ug/kg/min via INTRAVENOUS

## 2019-12-14 MED ORDER — CHLORHEXIDINE GLUCONATE CLOTH 2 % EX PADS: 6.0000 | MEDICATED_PAD | Freq: Once | CUTANEOUS | Status: DC

## 2019-12-14 MED ORDER — PROPOFOL 10 MG/ML IV BOLUS
INTRAVENOUS | Status: AC
  Filled 2019-12-14: qty 20

## 2019-12-14 MED ORDER — PROPOFOL 10 MG/ML IV BOLUS
INTRAVENOUS | Status: DC | PRN
  Administered 2019-12-14: 60 mg via INTRAVENOUS

## 2019-12-14 MED ORDER — LACTATED RINGERS IV SOLN: INTRAVENOUS | Status: DC | PRN

## 2019-12-14 MED ORDER — LACTATED RINGERS IV SOLN: Freq: Once | INTRAVENOUS | Status: AC

## 2019-12-14 NOTE — Discharge Instructions (Signed)
Colonoscopy Discharge Instructions  Read the instructions outlined below and refer to this sheet in the next few weeks. These discharge instructions provide you with general information on caring for yourself after you leave the hospital. Your doctor may also give you specific instructions. While your treatment has been planned according to the most current medical practices available, unavoidable complications occasionally occur. If you have any problems or questions after discharge, call Dr. Gala Romney at [phone]. ACTIVITY  You may resume your regular activity, but move at a slower pace for the next 24 hours.   Take frequent rest periods for the next 24 hours.   Walking will help get rid of the air and reduce the bloated feeling in your belly (abdomen).   No driving for 24 hours (because of the medicine (anesthesia) used during the test).    Do not sign any important legal documents or operate any machinery for 24 hours (because of the anesthesia used during the test).  NUTRITION  Drink plenty of fluids.   You may resume your normal diet as instructed by your doctor.   Begin with a light meal and progress to your normal diet. Heavy or fried foods are harder to digest and may make you feel sick to your stomach (nauseated).   Avoid alcoholic beverages for 24 hours or as instructed.  MEDICATIONS  You may resume your normal medications unless your doctor tells you otherwise.  WHAT YOU CAN EXPECT TODAY  Some feelings of bloating in the abdomen.   Passage of more gas than usual.   Spotting of blood in your stool or on the toilet paper.  IF YOU HAD POLYPS REMOVED DURING THE COLONOSCOPY:  No aspirin products for 7 days or as instructed.   No alcohol for 7 days or as instructed.   Eat a soft diet for the next 24 hours.  FINDING OUT THE RESULTS OF YOUR TEST Not all test results are available during your visit. If your test results are not back during the visit, make an appointment  with your caregiver to find out the results. Do not assume everything is normal if you have not heard from your caregiver or the medical facility. It is important for you to follow up on all of your test results.  SEEK IMMEDIATE MEDICAL ATTENTION IF:  You have more than a spotting of blood in your stool.   Your belly is swollen (abdominal distention).   You are nauseated or vomiting.   You have a temperature over 101.   You have abdominal pain or discomfort that is severe or gets worse throughout the day.    Constipation information provided  Hemorrhoid information provided  Recommend increasing the fiber intake in your diet.  Recommend Benefiber 1 to 2 tablespoons daily  Colon was a little bit inflamed.  Biopsies were taken.  I do not recommend a future colonoscopy unless new symptoms develop  Send you a letter next week regarding the biopsy report.  No evidence of any type of growth or tumor seen today.  Please call me if you have any future problems.    Constipation, Adult Constipation is when a person has fewer bowel movements in a week than normal, has difficulty having a bowel movement, or has stools that are dry, hard, or larger than normal. Constipation may be caused by an underlying condition. It may become worse with age if a person takes certain medicines and does not take in enough fluids. Follow these instructions at home: Eating and  drinking   Eat foods that have a lot of fiber, such as fresh fruits and vegetables, whole grains, and beans.  Limit foods that are high in fat, low in fiber, or overly processed, such as french fries, hamburgers, cookies, candies, and soda.  Drink enough fluid to keep your urine clear or pale yellow. General instructions  Exercise regularly or as told by your health care provider.  Go to the restroom when you have the urge to go. Do not hold it in.  Take over-the-counter and prescription medicines only as told by your health  care provider. These include any fiber supplements.  Practice pelvic floor retraining exercises, such as deep breathing while relaxing the lower abdomen and pelvic floor relaxation during bowel movements.  Watch your condition for any changes.  Keep all follow-up visits as told by your health care provider. This is important. Contact a health care provider if:  You have pain that gets worse.  You have a fever.  You do not have a bowel movement after 4 days.  You vomit.  You are not hungry.  You lose weight.  You are bleeding from the anus.  You have thin, pencil-like stools. Get help right away if:  You have a fever and your symptoms suddenly get worse.  You leak stool or have blood in your stool.  Your abdomen is bloated.  You have severe pain in your abdomen.  You feel dizzy or you faint. This information is not intended to replace advice given to you by your health care provider. Make sure you discuss any questions you have with your health care provider. Document Revised: 06/18/2017 Document Reviewed: 12/25/2015 Elsevier Patient Education  2020 [employer].     Hemorrhoids Hemorrhoids are swollen veins in and around the rectum or anus. There are two types of hemorrhoids:  Internal hemorrhoids. These occur in the veins that are just inside the rectum. They may poke through to the outside and become irritated and painful.  External hemorrhoids. These occur in the veins that are outside the anus and can be felt as a painful swelling or hard lump near the anus. Most hemorrhoids do not cause serious problems, and they can be managed with home treatments such as diet and lifestyle changes. If home treatments do not help the symptoms, procedures can be done to shrink or remove the hemorrhoids. What are the causes? This condition is caused by increased pressure in the anal area. This pressure may result from various things, including:  Constipation.  Straining to  have a bowel movement.  Diarrhea.  Pregnancy.  Obesity.  Sitting for long periods of time.  Heavy lifting or other activity that causes you to strain.  Anal sex.  Riding a bike for a long period of time. What are the signs or symptoms? Symptoms of this condition include:  Pain.  Anal itching or irritation.  Rectal bleeding.  Leakage of stool (feces).  Anal swelling.  One or more lumps around the anus. How is this diagnosed? This condition can often be diagnosed through a visual exam. Other exams or tests may also be done, such as:  An exam that involves feeling the rectal area with a gloved hand (digital rectal exam).  An exam of the anal canal that is done using a small tube (anoscope).  A blood test, if you have lost a significant amount of blood.  A test to look inside the colon using a flexible tube with a camera on the end (sigmoidoscopy  or colonoscopy). How is this treated? This condition can usually be treated at home. However, various procedures may be done if dietary changes, lifestyle changes, and other home treatments do not help your symptoms. These procedures can help make the hemorrhoids smaller or remove them completely. Some of these procedures involve surgery, and others do not. Common procedures include:  Rubber band ligation. Rubber bands are placed at the base of the hemorrhoids to cut off their blood supply.  Sclerotherapy. Medicine is injected into the hemorrhoids to shrink them.  Infrared coagulation. A type of light energy is used to get rid of the hemorrhoids.  Hemorrhoidectomy surgery. The hemorrhoids are surgically removed, and the veins that supply them are tied off.  Stapled hemorrhoidopexy surgery. The surgeon staples the base of the hemorrhoid to the rectal wall. Follow these instructions at home: Eating and drinking   Eat foods that have a lot of fiber in them, such as whole grains, beans, nuts, fruits, and vegetables.  Ask your  health care provider about taking products that have added fiber (fiber supplements).  Reduce the amount of fat in your diet. You can do this by eating low-fat dairy products, eating less red meat, and avoiding processed foods.  Drink enough fluid to keep your urine pale yellow. Managing pain and swelling   Take warm sitz baths for 20 minutes, 3-4 times a day to ease pain and discomfort. You may do this in a bathtub or using a portable sitz bath that fits over the toilet.  If directed, apply ice to the affected area. Using ice packs between sitz baths may be helpful. ? Put ice in a plastic bag. ? Place a towel between your skin and the bag. ? Leave the ice on for 20 minutes, 2-3 times a day. General instructions  Take over-the-counter and prescription medicines only as told by your health care provider.  Use medicated creams or suppositories as told.  Get regular exercise. Ask your health care provider how much and what kind of exercise is best for you. In general, you should do moderate exercise for at least 30 minutes on most days of the week (150 minutes each week). This can include activities such as walking, biking, or yoga.  Go to the bathroom when you have the urge to have a bowel movement. Do not wait.  Avoid straining to have bowel movements.  Keep the anal area dry and clean. Use wet toilet paper or moist towelettes after a bowel movement.  Do not sit on the toilet for long periods of time. This increases blood pooling and pain.  Keep all follow-up visits as told by your health care provider. This is important. Contact a health care provider if you have:  Increasing pain and swelling that are not controlled by treatment or medicine.  Difficulty having a bowel movement, or you are unable to have a bowel movement.  Pain or inflammation outside the area of the hemorrhoids. Get help right away if you have:  Uncontrolled bleeding from your rectum. Summary  Hemorrhoids  are swollen veins in and around the rectum or anus.  Most hemorrhoids can be managed with home treatments such as diet and lifestyle changes.  Taking warm sitz baths can help ease pain and discomfort.  In severe cases, procedures or surgery can be done to shrink or remove the hemorrhoids. This information is not intended to replace advice given to you by your health care provider. Make sure you discuss any questions you have with  your health care provider. Document Revised: 12/02/2018 Document Reviewed: 11/25/2017    Monitored Anesthesia Care, Care After These instructions provide you with information about caring for yourself after your procedure. Your health care provider may also give you more specific instructions. Your treatment has been planned according to current medical practices, but problems sometimes occur. Call your health care provider if you have any problems or questions after your procedure. What can I expect after the procedure? After your procedure, you may:  Feel sleepy for several hours.  Feel clumsy and have poor balance for several hours.  Feel forgetful about what happened after the procedure.  Have poor judgment for several hours.  Feel nauseous or vomit.  Have a sore throat if you had a breathing tube during the procedure. Follow these instructions at home: For at least 24 hours after the procedure:      Have a responsible adult stay with you. It is important to have someone help care for you until you are awake and alert.  Rest as needed.  Do not: ? Participate in activities in which you could fall or become injured. ? Drive. ? Use heavy machinery. ? Drink alcohol. ? Take sleeping pills or medicines that cause drowsiness. ? Make important decisions or sign legal documents. ? Take care of children on your own. Eating and drinking  Follow the diet that is recommended by your health care provider.  If you vomit, drink water, juice, or soup  when you can drink without vomiting.  Make sure you have little or no nausea before eating solid foods. General instructions  Take over-the-counter and prescription medicines only as told by your health care provider.  If you have sleep apnea, surgery and certain medicines can increase your risk for breathing problems. Follow instructions from your health care provider about wearing your sleep device: ? Anytime you are sleeping, including during daytime naps. ? While taking prescription pain medicines, sleeping medicines, or medicines that make you drowsy.  If you smoke, do not smoke without supervision.  Keep all follow-up visits as told by your health care provider. This is important. Contact a health care provider if:  You keep feeling nauseous or you keep vomiting.  You feel light-headed.  You develop a rash.  You have a fever. Get help right away if:  You have trouble breathing. Summary  For several hours after your procedure, you may feel sleepy and have poor judgment.  Have a responsible adult stay with you for at least 24 hours or until you are awake and alert. This information is not intended to replace advice given to you by your health care provider. Make sure you discuss any questions you have with your health care provider. Document Revised: 10/04/2017 Document Reviewed: 10/27/2015 Elsevier Patient Education  [place].

## 2019-12-14 NOTE — Op Note (Signed)
[facility] Patient Name: Douglas Keller Procedure Date: 12/14/2019 8:43 AM MRN: [id number] Date of Birth: [birth year] Attending MD: Norvel Richards , MD CSN: [id number] Age: 79 Admit Type: Outpatient Procedure:                Colonoscopy Indications:              Hematochezia Providers:                Norvel Richards, MD, Jeanann Lewandowsky. Sharon Seller, RN,                            Raphael Gibney, Technician Referring MD:              Medicines:                Propofol per Anesthesia Complications:            No immediate complications. Estimated Blood Loss:     Estimated blood loss was minimal. Procedure:                Pre-Anesthesia Assessment:                           - Prior to the procedure, a History and Physical                            was performed, and patient medications and                            allergies were reviewed. The patient's tolerance of                            previous anesthesia was also reviewed. The risks                            and benefits of the procedure and the sedation                            options and risks were discussed with the patient.                            All questions were answered, and informed consent                            was obtained. Prior Anticoagulants: The patient has                            taken no previous anticoagulant or antiplatelet                            agents. ASA Grade Assessment: II - A patient with                            mild systemic disease. After reviewing the risks  and benefits, the patient was deemed in                            satisfactory condition to undergo the procedure.                           After obtaining informed consent, the colonoscope                            was passed under direct vision. Throughout the                            procedure, the patient's blood pressure, pulse, and                            oxygen saturations  were monitored continuously. The                            [id number]) scope was introduced through                            the anus and advanced to the the cecum, identified                            by appendiceal orifice and ileocecal valve. The                            colonoscopy was performed without difficulty. The                            patient tolerated the procedure well. The quality                            of the bowel preparation was adequate. The                            ileocecal valve, appendiceal orifice, and rectum                            were photographed. Anatomical landmarks were                            photographed. The entire colon was well visualized. Scope In: 8:55:52 AM Scope Out: 9:14:18 AM Scope Withdrawal Time: 0 hours 10 minutes 55 seconds  Total Procedure Duration: 0 hours 18 minutes 26 seconds  Findings:      The perianal and digital rectal examinations were normal. Colonic mucosa       appeared normal aside from multiple 2 to 3 mm erosions in the base the       cecum. Biopsies taken.      Patient had grade 1 internal hemorrhoids as well. Otherwise, the colonic       mucosa appeared normal. Impression:               -Multiple tiny cecal erosions. Internal  hemorrhoids. Status post cecal biopsy. Cecal                            inflammation nonspecific. I suspect bleeding from                            hemorrhoids in the setting of constipation Moderate Sedation:      Moderate (conscious) sedation was personally administered by an       anesthesia professional. The following parameters were monitored: oxygen       saturation, heart rate, blood pressure, respiratory rate, EKG, adequacy       of pulmonary ventilation, and response to care. Recommendation:           - Patient has a contact number available for                            emergencies. The signs and symptoms of potential                             delayed complications were discussed with the                            patient. Return to normal activities tomorrow.                            Written discharge instructions were provided to the                            patient.                           - Advance diet as tolerated. Begin Benefiber daily.                            Follow-up on pathology. Procedure Code(s):        --- Professional ---                           [phone], Colonoscopy, flexible; diagnostic, including                            collection of specimen(s) by brushing or washing,                            when performed (separate procedure) Diagnosis Code(s):        --- Professional ---                           K92.1, Melena (includes Hematochezia) CPT copyright 2019 American Medical Association. All rights reserved. The codes documented in this report are preliminary and upon coder review may  be revised to meet current compliance requirements. [name]. [name], MD Norvel Richards, MD 12/14/2019 10:44:55 AM This report has been signed electronically. Number of Addenda: 0

## 2019-12-14 NOTE — H&P (Signed)
$'@LOGO'u$ @   Primary Care Physician:  Perlie Mayo, NP Primary Gastroenterologist:  Dr. Gala Romney  Pre-Procedure History & Physical: HPI:  Douglas Keller is a 79 y.o. male here for further evaluation of rectal bleeding via colonoscopy.  Only one episode earlier this year.  Occasional constipation.  Last colonoscopy 2014-negative.  Past Medical History:  Diagnosis Date  . Aortic valve sclerosis   . Atherosclerosis of coronary artery without angina pectoris 07/05/2014  . Cervical spondylosis without myelopathy 02/13/2013  . Chest pain 01/29/2019  . Degeneration of cervical intervertebral disc 02/13/2013  . Diabetes mellitus type 2, diet-controlled ([address])   . Elevated troponin   . GERD (gastroesophageal reflux disease)   . HTN (hypertension) 07/06/2013  . Hypercholesterolemia 08/21/2015  . Hyperlipidemia 07/06/2013  . Stiffness of left shoulder joint 07/19/2013    Past Surgical History:  Procedure Laterality Date  . CERVICAL SPINE SURGERY    . COLONOSCOPY  07/26/2012   Procedure: COLONOSCOPY;  Surgeon: Jamesetta So, MD;  Location: [facility];  Service: Gastroenterology;  Laterality: N/A;  . KNEE ARTHROSCOPY      Prior to Admission medications   Medication Sig Start Date End Date Taking? Authorizing Provider  acetaminophen (TYLENOL) 650 MG CR tablet Take 1,300 mg by mouth 2 (two) times daily.   Yes [provider]  amLODipine (NORVASC) 10 MG tablet Take 1 tablet (10 mg total) by mouth daily. 11/30/19  Yes Perlie Mayo, NP  aspirin EC 81 MG tablet Take 81 mg by mouth daily.   Yes [provider]  benazepril (LOTENSIN) 40 MG tablet Take 1 tablet (40 mg total) by mouth daily. 12/01/19  Yes Perlie Mayo, NP  buPROPion [facility] SR) 150 MG 12 hr tablet Take 1 tablet (150 mg total) by mouth daily. 08/22/19  Yes Perlie Mayo, NP  Evolocumab (REPATHA SURECLICK) 025 MG/ML SOAJ Inject 140 mg into the skin every 14 (fourteen) days. 06/22/19  Yes Croitoru, Mihai, MD   gabapentin (NEURONTIN) 300 MG capsule Take 1 capsule (300 mg total) by mouth 3 (three) times daily. 06/05/19  Yes Perlie Mayo, NP  GLUCOSAMINE PO Take 1 tablet by mouth at bedtime.   Yes [provider]  hydrOXYzine (ATARAX/VISTARIL) 10 MG tablet Take 1 tablet (10 mg total) by mouth at bedtime. 08/22/19  Yes Perlie Mayo, NP  KLOR-CON M20 20 MEQ tablet Take 1 tablet (20 mEq total) by mouth daily. 09/25/19  Yes Perlie Mayo, NP  Magnesium Oxide 400 MG CAPS Take 1 capsule (400 mg total) by mouth daily. 10/18/19  Yes Croitoru, Mihai, MD  meclizine (ANTIVERT) 12.5 MG tablet Take 1 tablet (12.5 mg total) by mouth 2 (two) times daily as needed for dizziness. 08/01/19  Yes Perlie Mayo, NP  Multiple Vitamins-Minerals (AIRBORNE PO) Take 1 each by mouth daily.   Yes [provider]  pantoprazole (PROTONIX) 40 MG tablet Take 1 tablet (40 mg total) by mouth daily. 11/30/19  Yes Perlie Mayo, NP  spironolactone-hydrochlorothiazide (ALDACTAZIDE) 25-25 MG tablet Take 1 tablet by mouth daily. 10/18/19  Yes Croitoru, Mihai, MD  Accu-Chek Softclix Lancets lancets Use to test blood sugar once daily 06/28/19   Perlie Mayo, NP  Alcohol Swabs (B-D SINGLE USE SWABS REGULAR) PADS  06/26/19   [provider]  Blood Glucose Calibration (ACCU-CHEK AVIVA) SOLN  06/26/19   [provider]  blood glucose meter kit and supplies Dispense based on patient and insurance preference. Use to test blood sugar once  daily. (FOR ICD-10 E10.9, E11.9). 06/22/19   Perlie Mayo, NP  glucose blood test strip Use to test blood sugar once daily 06/28/19   Perlie Mayo, NP  polyethylene glycol-electrolytes (TRILYTE) 420 g solution Take 4,000 mLs by mouth as directed. Patient not taking: Reported on 11/29/2019 09/21/19   Daneil Dolin, MD    Allergies as of 09/21/2019 - Review Complete 09/21/2019  Allergen Reaction Noted  . Crestor [rosuvastatin] Other (See Comments) 08/26/2017  . Statins Other  (See Comments) 08/01/2019    Family History  Problem Relation Age of Onset  . Asthma Mother   . Hypertension Father   . Colon cancer Neg Hx     Social History   Socioeconomic History  . Marital status: Widowed    Spouse name: Cherly   . Number of children: 1  . Years of education: Not on file  . Highest education level: 12th grade  Occupational History    Comment: Retired   Tobacco Use  . Smoking status: Never Smoker  . Smokeless tobacco: Never Used  Substance and Sexual Activity  . Alcohol use: No  . Drug use: No  . Sexual activity: Not Currently  Other Topics Concern  . Not on file  Social History Narrative   Live alone right now, wife passed in 2009    [place] 2 live in [place]      Enjoys playing golf      Diet: eats what he likes, sweet, fast food, does not eat a lot of canned or boxed foods.    Does not eat enough fruits and veggies.       Caffeine: 1 cup daily   Water: 3-4 bottles of water daily, Gatorade every now and then         Wears seat belt   Wears sun protection when golfing   Smoke detectors at home   Does not use phone while driving   Social Determinants of Health   Financial Resource Strain: Low Risk   . Difficulty of Paying Living Expenses: Not hard at all  Food Insecurity: No Food Insecurity  . Worried About Charity fundraiser in the Last Year: Never true  . Ran Out of Food in the Last Year: Never true  Transportation Needs: No Transportation Needs  . Lack of Transportation (Medical): No  . Lack of Transportation (Non-Medical): No  Physical Activity: Insufficiently Active  . Days of Exercise per Week: 5 days  . Minutes of Exercise per Session: 20 min  Stress: Stress Concern Present  . Feeling of Stress : Rather much  Social Connections: Somewhat Isolated  . Frequency of Communication with Friends and Family: More than three times a week  . Frequency of Social Gatherings with Friends and Family: More than three  times a week  . Attends Religious Services: More than 4 times per year  . Active Member of Clubs or Organizations: No  . Attends Archivist Meetings: Never  . Marital Status: Widowed  Intimate Partner Violence: Not At Risk  . Fear of Current or Ex-Partner: No  . Emotionally Abused: No  . Physically Abused: No  . Sexually Abused: No    Review of Systems: See HPI, otherwise negative ROS  Physical Exam: BP (!) 152/83   Pulse 76   Temp 98 F (36.7 C) (Oral)   Resp (!) 22   Ht 5' 6"  (1.676 m)   Wt 95.3 kg   SpO2 98%   BMI 33.89  kg/m  General:   Alert,  Well-developed, well-nourished, pleasant and cooperative in NAD  Mouth:  No deformity or lesions. Neck:  Supple; no masses or thyromegaly. No significant cervical adenopathy. Lungs:  Clear throughout to auscultation.   No wheezes, crackles, or rhonchi. No acute distress. Heart:  Regular rate and rhythm; no murmurs, clicks, rubs,  or gallops. Abdomen: Non-distended, normal bowel sounds.  Soft and nontender without appreciable mass or hepatosplenomegaly.  Pulses:  Normal pulses noted. Extremities:  Without clubbing or edema.  Impression/Plan: 79 year old gentleman with occasional constipation single episode of rectal bleeding earlier this year.  He is here for diagnostic colonoscopy per plan. The risks, benefits, limitations, alternatives and imponderables have been reviewed with the patient. Questions have been answered. All parties are agreeable.      Notice: This dictation was prepared with Dragon dictation along with smaller phrase technology. Any transcriptional errors that result from this process are unintentional and may not be corrected upon review.

## 2019-12-14 NOTE — Anesthesia Preprocedure Evaluation (Signed)
Anesthesia Evaluation  Patient identified by MRN, date of birth, ID band Patient awake    Reviewed: Allergy & Precautions, NPO status , Patient's Chart, lab work & pertinent test results  History of Anesthesia Complications Negative for: history of anesthetic complications  Airway Mallampati: III  TM Distance: >3 FB Neck ROM: Full    Dental  (+) Chipped, Dental Advisory Given,    Pulmonary neg pulmonary ROS,    Pulmonary exam normal breath sounds clear to auscultation       Cardiovascular Exercise Tolerance: Good hypertension, Pt. on medications + CAD  Normal cardiovascular exam Rhythm:Regular Rate:Normal     Neuro/Psych PSYCHIATRIC DISORDERS Depression negative neurological ROS     GI/Hepatic Neg liver ROS, GERD  Medicated,  Endo/Other  diabetes, Well Controlled, Type 2, Oral Hypoglycemic Agents  Renal/GU negative Renal ROS     Musculoskeletal  (+) Arthritis ,   Abdominal   Peds  Hematology   Anesthesia Other Findings   Reproductive/Obstetrics                            Anesthesia Physical Anesthesia Plan  ASA: II  Anesthesia Plan: General   Post-op Pain Management:    Induction: Intravenous  PONV Risk Score and Plan: 2 and TIVA  Airway Management Planned: Nasal Cannula and Natural Airway  Additional Equipment:   Intra-op Plan:   Post-operative Plan:   Informed Consent: I have reviewed the patients History and Physical, chart, labs and discussed the procedure including the risks, benefits and alternatives for the proposed anesthesia with the patient or authorized representative who has indicated his/her understanding and acceptance.     Dental advisory given  Plan Discussed with: CRNA and Surgeon  Anesthesia Plan Comments:        Anesthesia Quick Evaluation

## 2019-12-14 NOTE — Anesthesia Postprocedure Evaluation (Signed)
Anesthesia Post Note  Patient: Douglas Keller  Procedure(s) Performed: COLONOSCOPY WITH PROPOFOL (N/A ) BIOPSY  Patient location during evaluation: PACU Anesthesia Type: General Level of consciousness: awake, oriented, awake and alert and patient cooperative Pain management: satisfactory to patient Vital Signs Assessment: post-procedure vital signs reviewed and stable Respiratory status: spontaneous breathing, respiratory function stable and nonlabored ventilation Cardiovascular status: stable Postop Assessment: no apparent nausea or vomiting Anesthetic complications: no     Last Vitals:  Vitals:   12/14/19 0757  BP: (!) 152/83  Pulse: 76  Resp: (!) 22  Temp: 36.7 C  SpO2: 98%    Last Pain:  Vitals:   12/14/19 0850  TempSrc:   PainSc: 0-No pain                 GREGORY,SUZANNE

## 2019-12-14 NOTE — Transfer of Care (Signed)
Immediate Anesthesia Transfer of Care Note  Patient: Douglas Keller  Procedure(s) Performed: COLONOSCOPY WITH PROPOFOL (N/A ) BIOPSY  Patient Location: PACU  Anesthesia Type:General  Level of Consciousness: awake, alert , oriented and patient cooperative  Airway & Oxygen Therapy: Patient Spontanous Breathing  Post-op Assessment: Report given to RN, Post -op Vital signs reviewed and stable and Patient moving all extremities X 4  Post vital signs: Reviewed and stable  Last Vitals:  Vitals Value Taken Time  BP 128/79 12/14/19 0920  Temp    Pulse 75 12/14/19 0922  Resp 22 12/14/19 0922  SpO2 94 % 12/14/19 0922  Vitals shown include unvalidated device data.  Last Pain:  Vitals:   12/14/19 0850  TempSrc:   PainSc: 0-No pain         Complications: No apparent anesthesia complications

## 2019-12-15 ENCOUNTER — Encounter: Payer: Self-pay | Admitting: Internal Medicine

## 2019-12-15 LAB — SURGICAL PATHOLOGY

## 2019-12-20 ENCOUNTER — Telehealth: Payer: Self-pay | Admitting: Cardiovascular Disease

## 2019-12-20 NOTE — Telephone Encounter (Signed)
Patient called for preop clearance advised him to have office during the surgery to call us for clearance.

## 2019-12-21 ENCOUNTER — Telehealth: Payer: Self-pay | Admitting: Cardiovascular Disease

## 2019-12-21 NOTE — Telephone Encounter (Signed)
   [facility] Pre-operative Risk Assessment    HEARTCARE STAFF: - Please ensure there is not already an duplicate clearance open for this procedure. - Under Visit Info/Reason for Call, type in Other and utilize the format Clearance MM/DD/YY or Clearance TBD. Do not use dashes or single digits. - If request is for dental extraction, please clarify the # of teeth to be extracted.  Request for surgical clearance:  1. What type of surgery is being performed? Lumbar Laminotomy  2. When is this surgery scheduled? 12/27/19  3. What type of clearance is required (medical clearance vs. Pharmacy clearance to hold med vs. Both)? Medical  4. Are there any medications that need to be held prior to surgery and how long? Pt on no blood thinners  5. Practice name and name of physician performing surgery? [facility]: Dr Arnoldo Morale   6. What is the office phone number? [phone] Ext [phone]   7.   What is the office fax number? [phone]  8.   Anesthesia type (None, local, MAC, general) ? General   Douglas Keller 12/21/2019, 9:44 AM

## 2019-12-21 NOTE — Telephone Encounter (Signed)
   [facility] Pre-operative Risk Assessment    HEARTCARE STAFF: - Please ensure there is not already an duplicate clearance open for this procedure. - Under Visit Info/Reason for Call, type in Other and utilize the format Clearance MM/DD/YY or Clearance TBD. Do not use dashes or single digits. - If request is for dental extraction, please clarify the # of teeth to be extracted.  Request for surgical clearance:  1. What type of surgery is being performed? Lumbar Laminotomy  2. When is this surgery scheduled? 6/21   3. What type of clearance is required (medical clearance vs. Pharmacy clearance to hold med vs. Both)? Medical  4. Are there any medications that need to be held prior to surgery and how long? Pt on no blood thinners  5. Practice name and name of physician performing surgery? [facility]: Dr Arnoldo Morale   6. What is the office phone number? [phone] Ext [phone]   7.   What is the office fax number? [phone]  8.   Anesthesia type (None, local, MAC, general) ? General   Douglas Keller 12/21/2019, 9:44 AM  _________________________________________________________________   (provider comments below)

## 2019-12-22 NOTE — Telephone Encounter (Signed)
Patient returning call.

## 2019-12-22 NOTE — Telephone Encounter (Signed)
   Primary Cardiologist: Sanda Klein, MD  Chart reviewed as part of pre-operative protocol coverage. Patient was contacted 12/22/2019 in reference to pre-operative risk assessment for pending surgery as outlined below.  Douglas Keller was last seen on 10/17/2019 by Dr. Sallyanne Kuster.  Since that day, Douglas Keller has done well without any chest pain or shortness of breath. Despite the back issue, he can accomplish at least 4 METS of activity without any issue.   Therefore, based on ACC/AHA guidelines, the patient would be at acceptable risk for the planned procedure without further cardiovascular testing.   I will route this recommendation to the requesting party via Epic fax function and remove from pre-op pool. Please call with questions.  Note, patient has been holding aspirin for the upcoming surgery. He will need to restart aspirin as soon as possible after the procedure.   Also note, it appears all of his medications were discontinued after the recent colonoscopy procedure, however he is still taking them at home. I suspect this is a mistake, will ask our staff to call him and reconcile his home meds.   Douglas Keller, [name] 12/22/2019, 10:34 AM

## 2019-12-22 NOTE — Telephone Encounter (Signed)
Spoke with pt who verified all of his medications and I have abstracted his chart to make his meds up to date. Pt thanked me for the call.

## 2019-12-22 NOTE — Telephone Encounter (Signed)
Called patient to go over his medications but he was not at home to let me know what he is taking. Patient states that he will call me back when gets home.

## 2019-12-25 ENCOUNTER — Telehealth: Payer: Self-pay

## 2019-12-25 NOTE — Telephone Encounter (Signed)
Called patient to verify which medication he is requesting. No answer. Left generic vm asking for call back.

## 2019-12-25 NOTE — Telephone Encounter (Signed)
Pt LVM that he needs Meoxicam 12.5mg . He said that some was called in around Jan, he didn't think it was working but it is, and wants to continue

## 2019-12-25 NOTE — Telephone Encounter (Signed)
Patient states he thought it wasn't working. He started taking it again a couple a weeks ago and took it twice a day and it was working. Wants a refill

## 2019-12-26 ENCOUNTER — Other Ambulatory Visit: Payer: Self-pay | Admitting: *Deleted

## 2019-12-26 ENCOUNTER — Other Ambulatory Visit (HOSPITAL_COMMUNITY)
Admission: RE | Admit: 2019-12-26 | Discharge: 2019-12-26 | Disposition: A | Discharge status: Home / Self Care | Payer: Medicare HMO | Source: Ambulatory Visit | Attending: Neurosurgery | Admitting: Neurosurgery

## 2019-12-26 ENCOUNTER — Encounter (HOSPITAL_COMMUNITY): Payer: Self-pay | Admitting: Neurosurgery

## 2019-12-26 ENCOUNTER — Other Ambulatory Visit: Payer: Self-pay

## 2019-12-26 DIAGNOSIS — Z20822 Contact with and (suspected) exposure to covid-19: Secondary | ICD-10-CM | POA: Insufficient documentation

## 2019-12-26 DIAGNOSIS — Z01812 Encounter for preprocedural laboratory examination: Secondary | ICD-10-CM | POA: Diagnosis not present

## 2019-12-26 LAB — SARS CORONAVIRUS 2 (TAT 6-24 HRS): SARS Coronavirus 2: NEGATIVE

## 2019-12-26 MED ORDER — MECLIZINE HCL 12.5 MG PO TABS: 12.5000 mg | ORAL_TABLET | Freq: Two times a day (BID) | ORAL | 0 refills | Status: DC | PRN

## 2019-12-26 NOTE — Telephone Encounter (Signed)
Yes, please send in refill.  Thank you

## 2019-12-26 NOTE — Telephone Encounter (Signed)
I don't see Meloxicam on his med list. Are we sure this was the right med? Also please ask about any symptoms as well. Thank you

## 2019-12-26 NOTE — Anesthesia Preprocedure Evaluation (Addendum)
Anesthesia Evaluation  Patient identified by MRN, date of birth, ID band Patient awake    Reviewed: Allergy & Precautions, NPO status , Patient's Chart, lab work & pertinent test results  Airway Mallampati: I  TM Distance: >3 FB Neck ROM: Full    Dental  (+) Teeth Intact, Dental Advisory Given   Pulmonary neg pulmonary ROS,    breath sounds clear to auscultation       Cardiovascular hypertension, Pt. on medications + CAD   Rhythm:Regular Rate:Normal + Systolic murmurs    Neuro/Psych PSYCHIATRIC DISORDERS Depression negative neurological ROS     GI/Hepatic Neg liver ROS, GERD  Medicated,  Endo/Other  diabetes, Type 2  Renal/GU negative Renal ROS     Musculoskeletal  (+) Arthritis ,   Abdominal Normal abdominal exam  (+)   Peds  Hematology negative hematology ROS (+)   Anesthesia Other Findings   Reproductive/Obstetrics                            Anesthesia Physical Anesthesia Plan  ASA: III  Anesthesia Plan: General   Post-op Pain Management:    Induction: Intravenous  PONV Risk Score and Plan: 3 and Ondansetron and Treatment may vary due to age or medical condition  Airway Management Planned: Oral ETT  Additional Equipment: None  Intra-op Plan:   Post-operative Plan: Extubation in OR  Informed Consent: I have reviewed the patients History and Physical, chart, labs and discussed the procedure including the risks, benefits and alternatives for the proposed anesthesia with the patient or authorized representative who has indicated his/her understanding and acceptance.     Dental advisory given  Plan Discussed with: CRNA  Anesthesia Plan Comments: (PAT note written 12/26/2019 by Myra Gianotti, PA-C.  ECho: 1. The left ventricle has mildly reduced systolic function, with an  ejection fraction of 45-50%. The cavity size was normal. There is mildly  increased left  ventricular wall thickness. Left ventricular diastolic  Doppler parameters are consistent with  impaired relaxation. Elevated left ventricular end-diastolic pressure The  E/e' is 15.1.  2. Probable lateral wall motion abnormality with moderate hypokinesis. LV  endocardial border is not optimally visualized in traditional views and LV  is foreshortened, therefore exact localization of wall motion  abnormalities challenging to discern.  3. The right ventricle has normal systolic function. The cavity was  normal. There is no increase in right ventricular wall thickness.  4. The aortic valve is abnormal. Mild calcification of the aortic valve.  No stenosis of the aortic valve. Mild aortic annular calcification noted.  )      Anesthesia Quick Evaluation

## 2019-12-26 NOTE — Progress Notes (Signed)
Anesthesia Chart Review:  Case: [id number] Date/Time: 12/27/19 0900   Procedure: BIATERAL LUMBAR 2- LUMBAR 3, LUMBAR 3- LUMBAR 4 LAMINOTOMY/FORAMINOTOMY (Bilateral ) - 3C   Anesthesia type: General   Pre-op diagnosis: SPINAL STENOIS, LUMBAR REGION WITH NEUROGENIC CLAUDICATION   Location: [facility] OR ROOM 20 / [facility]   Surgeons: Newman Pies, MD      DISCUSSION: Patient is a 79 year old male scheduled for the above procedure.  History includes never smoker, HLD, HTN, CAD, AV sclerosis (no AS 01/2019 echo), DM2 (Pre-diabetic), cervical disc disease (s/p surgery). GERD. Admission for chest pain with flat, mildly elevated troponins 01/2019 with low risk stress test--he is followed by cardiology.  Preoperative cardiology input outlined by Almyra Deforest, McKenna on 12/22/19: "...Douglas Keller was last seen on 10/17/2019 by Dr. Sallyanne Kuster.  Since that day, Douglas Keller has done well without any chest pain or shortness of breath. Despite the back issue, he can accomplish at least 4 METS of activity without any issue.   Therefore, based on ACC/AHA guidelines, the patient would be at acceptable risk for the planned procedure without further cardiovascular testing."  Reportedly he is getting preoperative COVID-19 test on 12/26/2019.  Anesthesia team to evaluate on the day of surgery.   VS:   Wt Readings from Last 3 Encounters:  12/14/19 95.3 kg  12/12/19 95.3 kg  11/29/19 96.6 kg   BP Readings from Last 3 Encounters:  12/14/19 140/90  12/12/19 134/79  11/29/19 130/80   Pulse Readings from Last 3 Encounters:  12/14/19 70  12/12/19 88  11/29/19 82    PROVIDERS: Perlie Mayo, NP is PCP  Sanda Klein, MD is cardiologist. Last evaluation 10/17/19. No chest pain. Known mildly reduced LVEF, but without symptoms of CHF, likely related to BP. 12 month follow-up recommened.   LABS: He is for updated labs on the day of surgery. As of 12/12/19: Lab Results  Component Value Date   WBC 8.1 08/17/2019   HGB 13.7  08/17/2019   HCT 42.3 08/17/2019   PLT 295 08/17/2019   GLUCOSE 151 (H) 12/12/2019   ALT 24 08/17/2019   AST 26 08/17/2019   NA 138 12/12/2019   K 3.8 12/12/2019   CL 104 12/12/2019   CREATININE 1.23 12/12/2019   BUN 17 12/12/2019   CO2 21 (L) 12/12/2019   HGBA1C 6.5 (H) 06/28/2019    IMAGES: CT L-spine with myelogram 10/04/19: IMPRESSION: 1. Multilevel lumbar spondylosis as described above, overall similar to prior MRI from March 2019. Unchanged moderate spinal canal and bilateral neuroforaminal stenosis at L2-L3 and L3-L4. 2. Improved now mild spinal canal stenosis at L4-L5 due to decreased posterior epidural fat. 3. Advanced multilevel facet arthropathy. Unchanged trace anterolisthesis at L4-L5 without dynamic instability. 4. Aortic atherosclerosis (ICD10-I70.0).  1V PCXR 07/22/19: FINDINGS: - The cardiac and mediastinal silhouettes are stable in size and contour, and remain within normal limits. - The lungs are normally inflated. No airspace consolidation, pleural effusion, or pulmonary edema. No pneumothorax. - No acute osseous abnormality. IMPRESSION: No active cardiopulmonary disease.   EKG: 10/07/19: NSR   CV: Nuclear stress tet 01/30/19:  There was no ST segment deviation noted during stress.  This is a low risk study.  Nuclear stress EF: 47%.  Defect 1: There is a medium defect of mild severity present in the basal inferior and mid inferior location. This is likely due to soft tissue attenuation.  No large ischemic territories.   Echo 01/29/19: IMPRESSIONS  1. The left ventricle has  mildly reduced systolic function, with an  ejection fraction of 45-50%. The cavity size was normal. There is mildly  increased left ventricular wall thickness. Left ventricular diastolic  Doppler parameters are consistent with  impaired relaxation. Elevated left ventricular end-diastolic pressure The  E/e' is 15.1.  2. Probable lateral wall motion abnormality with  moderate hypokinesis. LV  endocardial border is not optimally visualized in traditional views and LV  is foreshortened, therefore exact localization of wall motion  abnormalities challenging to discern.  3. The right ventricle has normal systolic function. The cavity was  normal. There is no increase in right ventricular wall thickness.  4. The aortic valve is abnormal. Mild calcification of the aortic valve.  No stenosis of the aortic valve. Mild aortic annular calcification noted.    30-day Event Monitor 05/18/17:  30-day event monitor shows the dominant rhythm to be sinus bradycardia. On all days the average heart rate is in the mid 50-low 60 range.  62% of the time the patient's rhythm is sinus bradycardia. Tachycardia is virtually never seen.  There are rare PVCs.  There is no clear correlation between the patient's symptomatic events in the rhythm at the time of recording. Patient abnormal 30-day event monitor due to the persistent mild sinus bradycardia. No other serious arrhythmias are seen. She had a recent Prominent and persistent sinus bradycardia with limited circadian rhythm variation. In the absence of medications with negative chronotropic effects, findings are strongly suggestive of sinus node dysfunction with chronotropic incompetence. Findings are not expected to cause dizziness or syncope, but may be responsible for fatigue, low energy levels. No treatment is necessary if the patient is asymptomatic, but pacemaker therapy is indicated for bradycardia related symptoms.   According to 10/17/19 cardiology note by Dr. Sallyanne Kuster, "Cardiac catheterization in 2006 showed non obstructive atherosclerosis (40% proximal LAD, 40% mid RCA)".   Past Medical History:  Diagnosis Date  . Aortic valve sclerosis   . Atherosclerosis of coronary artery without angina pectoris 07/05/2014  . Cervical spondylosis without myelopathy 02/13/2013  . Chest pain 01/29/2019  . Degeneration of  cervical intervertebral disc 02/13/2013  . Depression    sometimes  . Diabetes mellitus type 2, diet-controlled ([place])   . Elevated troponin   . GERD (gastroesophageal reflux disease)   . Heart murmur   . HTN (hypertension) 07/06/2013  . Hypercholesterolemia 08/21/2015  . Hyperlipidemia 07/06/2013  . Pre-diabetes   . Stiffness of left shoulder joint 07/19/2013    Past Surgical History:  Procedure Laterality Date  . BIOPSY  12/14/2019   Procedure: BIOPSY;  Surgeon: Daneil Dolin, MD;  [facility];  Service: Endoscopy;;  . CERVICAL SPINE SURGERY    . COLONOSCOPY  07/26/2012   Procedure: COLONOSCOPY;  Surgeon: Jamesetta So, MD;  [facility];  Service: Gastroenterology;  Laterality: N/A;  . COLONOSCOPY WITH PROPOFOL N/A 12/14/2019   Procedure: COLONOSCOPY WITH PROPOFOL;  Surgeon: Daneil Dolin, MD;  [facility];  Service: Endoscopy;  Laterality: N/A;  9:00am  . KNEE ARTHROSCOPY Left    torn ligament    MEDICATIONS: No current facility-administered medications for this encounter.   Marland Kitchen acetaminophen (TYLENOL) 650 MG CR tablet  . amLODipine (NORVASC) 10 MG tablet  . aspirin EC 81 MG tablet  . benazepril (LOTENSIN) 40 MG tablet  . buPROPion (ZYBAN) 150 MG 12 hr tablet  . Evolocumab (REPATHA SURECLICK) 811 MG/ML SOAJ  . gabapentin (NEURONTIN) 300 MG capsule  . GLUCOSAMINE HCL PO  .  magnesium oxide (MAG-OX) 400 MG tablet  . meclizine (ANTIVERT) 12.5 MG tablet  . multivitamin-iron-minerals-folic acid (CENTRUM) chewable tablet  . pantoprazole (PROTONIX) 40 MG tablet  . potassium chloride SA (KLOR-CON) 20 MEQ tablet  . spironolactone-hydrochlorothiazide (ALDACTAZIDE) 25-25 MG tablet  Hold ASA per surgeon instructions.   Myra Gianotti, PA-C Surgical Short Stay/Anesthesiology [facility] Phone [phone] [facility] Phone [phone] 12/26/2019 12:12 PM

## 2019-12-26 NOTE — Telephone Encounter (Signed)
Refill sent pt notified

## 2019-12-26 NOTE — Telephone Encounter (Signed)
Pt wants refill on meclizine. Not meloxicam. Would you like to refill this for his dizziness?

## 2019-12-26 NOTE — Progress Notes (Addendum)
Mr. Douglas Keller denies chest pain or shortness of breath. Mr. Douglas Keller will be tested for Covid this afternoon.   Mr. Douglas Keller is  pre-diabetic, he rarely checks CBG. I instructed patient to check CBG after awaking and every 2 hours until arrival  to the hospital.  I Instructed patient if CBG is less than 70 to drink  1/2 cup of a clear juice. Recheck CBG in 15 minutes then call pre- op desk at [phone] for further instructions.   Mr. Douglas Keller reports that he rarely takes Antivert- "I am actually out of them." Mr. Douglas Keller also reports that Drs. have not been able to diagnosis him, he experiences ringing in ears an dizziness.  I have been told that I do not have Vertigo.

## 2019-12-27 ENCOUNTER — Encounter (HOSPITAL_COMMUNITY)
Admission: RE | Disposition: A | Discharge status: Home / Self Care | Payer: Self-pay | Source: Home / Self Care | Attending: Neurosurgery

## 2019-12-27 ENCOUNTER — Ambulatory Visit (HOSPITAL_COMMUNITY): Payer: Medicare HMO

## 2019-12-27 ENCOUNTER — Ambulatory Visit (HOSPITAL_COMMUNITY)
Admission: RE | Admit: 2019-12-27 | Discharge: 2019-12-28 | Disposition: A | Discharge status: Home / Self Care | Payer: Medicare HMO | Attending: Neurosurgery | Admitting: Neurosurgery

## 2019-12-27 ENCOUNTER — Ambulatory Visit (HOSPITAL_COMMUNITY): Payer: Medicare HMO | Admitting: Vascular Surgery

## 2019-12-27 ENCOUNTER — Ambulatory Visit: Payer: Medicare HMO | Admitting: Nurse Practitioner

## 2019-12-27 ENCOUNTER — Encounter (HOSPITAL_COMMUNITY): Payer: Self-pay | Admitting: Neurosurgery

## 2019-12-27 ENCOUNTER — Other Ambulatory Visit: Payer: Self-pay

## 2019-12-27 DIAGNOSIS — E119 Type 2 diabetes mellitus without complications: Secondary | ICD-10-CM | POA: Insufficient documentation

## 2019-12-27 DIAGNOSIS — K219 Gastro-esophageal reflux disease without esophagitis: Secondary | ICD-10-CM | POA: Diagnosis not present

## 2019-12-27 DIAGNOSIS — Z6834 Body mass index (BMI) 34.0-34.9, adult: Secondary | ICD-10-CM | POA: Insufficient documentation

## 2019-12-27 DIAGNOSIS — Z419 Encounter for procedure for purposes other than remedying health state, unspecified: Secondary | ICD-10-CM

## 2019-12-27 DIAGNOSIS — E669 Obesity, unspecified: Secondary | ICD-10-CM | POA: Insufficient documentation

## 2019-12-27 DIAGNOSIS — E785 Hyperlipidemia, unspecified: Secondary | ICD-10-CM | POA: Diagnosis not present

## 2019-12-27 DIAGNOSIS — M48062 Spinal stenosis, lumbar region with neurogenic claudication: Secondary | ICD-10-CM | POA: Diagnosis not present

## 2019-12-27 DIAGNOSIS — M5116 Intervertebral disc disorders with radiculopathy, lumbar region: Secondary | ICD-10-CM | POA: Diagnosis not present

## 2019-12-27 DIAGNOSIS — F329 Major depressive disorder, single episode, unspecified: Secondary | ICD-10-CM | POA: Diagnosis not present

## 2019-12-27 DIAGNOSIS — M4316 Spondylolisthesis, lumbar region: Secondary | ICD-10-CM | POA: Diagnosis not present

## 2019-12-27 DIAGNOSIS — I700 Atherosclerosis of aorta: Secondary | ICD-10-CM | POA: Insufficient documentation

## 2019-12-27 DIAGNOSIS — I2510 Atherosclerotic heart disease of native coronary artery without angina pectoris: Secondary | ICD-10-CM | POA: Insufficient documentation

## 2019-12-27 DIAGNOSIS — M4726 Other spondylosis with radiculopathy, lumbar region: Secondary | ICD-10-CM | POA: Diagnosis not present

## 2019-12-27 DIAGNOSIS — Z981 Arthrodesis status: Secondary | ICD-10-CM | POA: Diagnosis not present

## 2019-12-27 DIAGNOSIS — M5416 Radiculopathy, lumbar region: Secondary | ICD-10-CM | POA: Diagnosis not present

## 2019-12-27 DIAGNOSIS — Z79899 Other long term (current) drug therapy: Secondary | ICD-10-CM | POA: Insufficient documentation

## 2019-12-27 DIAGNOSIS — I10 Essential (primary) hypertension: Secondary | ICD-10-CM | POA: Insufficient documentation

## 2019-12-27 DIAGNOSIS — Z7982 Long term (current) use of aspirin: Secondary | ICD-10-CM | POA: Insufficient documentation

## 2019-12-27 DIAGNOSIS — E7800 Pure hypercholesterolemia, unspecified: Secondary | ICD-10-CM | POA: Diagnosis not present

## 2019-12-27 HISTORY — PX: LUMBAR LAMINECTOMY/DECOMPRESSION MICRODISCECTOMY: SHX5026

## 2019-12-27 HISTORY — DX: Prediabetes: R73.03

## 2019-12-27 HISTORY — DX: Depression, unspecified: F32.A

## 2019-12-27 HISTORY — DX: Cardiac murmur, unspecified: R01.1

## 2019-12-27 LAB — GLUCOSE, CAPILLARY: Glucose-Capillary: 145 mg/dL — ABNORMAL HIGH (ref 70–99)

## 2019-12-27 SURGERY — LUMBAR LAMINECTOMY/DECOMPRESSION MICRODISCECTOMY 2 LEVELS
Anesthesia: General | Site: Spine Lumbar | Laterality: Bilateral

## 2019-12-27 MED ORDER — ORAL CARE MOUTH RINSE: 15.0000 mL | Freq: Once | OROMUCOSAL | Status: DC

## 2019-12-27 MED ORDER — SUGAMMADEX SODIUM 200 MG/2ML IV SOLN
INTRAVENOUS | Status: DC | PRN
  Administered 2019-12-27: 200 mg via INTRAVENOUS

## 2019-12-27 MED ORDER — ACETAMINOPHEN 500 MG PO TABS
1000.0000 mg | ORAL_TABLET | Freq: Four times a day (QID) | ORAL | Status: AC
  Administered 2019-12-27 – 2019-12-28 (×4): 1000 mg via ORAL
  Filled 2019-12-27 (×4): qty 2

## 2019-12-27 MED ORDER — CEFAZOLIN SODIUM-DEXTROSE 2-4 GM/100ML-% IV SOLN
2.0000 g | INTRAVENOUS | Status: AC
  Administered 2019-12-27: 2 g via INTRAVENOUS
  Filled 2019-12-27: qty 100

## 2019-12-27 MED ORDER — GABAPENTIN 300 MG PO CAPS
300.0000 mg | ORAL_CAPSULE | Freq: Three times a day (TID) | ORAL | Status: DC
  Administered 2019-12-27 – 2019-12-28 (×3): 300 mg via ORAL
  Filled 2019-12-27 (×3): qty 1

## 2019-12-27 MED ORDER — SODIUM CHLORIDE 0.9 % IV SOLN
INTRAVENOUS | Status: DC | PRN
  Administered 2019-12-27: 500 mL

## 2019-12-27 MED ORDER — PHENYLEPHRINE 40 MCG/ML (10ML) SYRINGE FOR IV PUSH (FOR BLOOD PRESSURE SUPPORT)
PREFILLED_SYRINGE | INTRAVENOUS | Status: AC
  Filled 2019-12-27: qty 10

## 2019-12-27 MED ORDER — 0.9 % SODIUM CHLORIDE (POUR BTL) OPTIME
TOPICAL | Status: DC | PRN
  Administered 2019-12-27: 1000 mL

## 2019-12-27 MED ORDER — ACETAMINOPHEN 325 MG PO TABS: 325.0000 mg | ORAL_TABLET | Freq: Once | ORAL | Status: DC | PRN

## 2019-12-27 MED ORDER — PROMETHAZINE HCL 25 MG/ML IJ SOLN: 6.2500 mg | INTRAMUSCULAR | Status: DC | PRN

## 2019-12-27 MED ORDER — ONDANSETRON HCL 4 MG PO TABS: 4.0000 mg | ORAL_TABLET | Freq: Four times a day (QID) | ORAL | Status: DC | PRN

## 2019-12-27 MED ORDER — MECLIZINE HCL 12.5 MG PO TABS
12.5000 mg | ORAL_TABLET | Freq: Two times a day (BID) | ORAL | Status: DC | PRN
  Filled 2019-12-27: qty 1

## 2019-12-27 MED ORDER — SODIUM CHLORIDE 0.9% FLUSH
3.0000 mL | Freq: Two times a day (BID) | INTRAVENOUS | Status: DC
  Administered 2019-12-27 (×2): 3 mL via INTRAVENOUS

## 2019-12-27 MED ORDER — SODIUM CHLORIDE 0.9 % IV SOLN
250.0000 mL | INTRAVENOUS | Status: DC
  Administered 2019-12-27: 250 mL via INTRAVENOUS

## 2019-12-27 MED ORDER — LACTATED RINGERS IV SOLN: INTRAVENOUS | Status: DC

## 2019-12-27 MED ORDER — SPIRONOLACTONE-HCTZ 25-25 MG PO TABS
1.0000 | ORAL_TABLET | Freq: Every day | ORAL | Status: DC
  Filled 2019-12-27 (×3): qty 1

## 2019-12-27 MED ORDER — PROPOFOL 10 MG/ML IV BOLUS
INTRAVENOUS | Status: DC | PRN
  Administered 2019-12-27: 150 mg via INTRAVENOUS

## 2019-12-27 MED ORDER — MEPERIDINE HCL 25 MG/ML IJ SOLN: 6.2500 mg | INTRAMUSCULAR | Status: DC | PRN

## 2019-12-27 MED ORDER — PHENYLEPHRINE HCL-NACL 10-0.9 MG/250ML-% IV SOLN
INTRAVENOUS | Status: DC | PRN
Start: 2019-12-27 — End: 2019-12-27
  Administered 2019-12-27: 20 ug/min via INTRAVENOUS

## 2019-12-27 MED ORDER — ACETAMINOPHEN 650 MG RE SUPP: 650.0000 mg | RECTAL | Status: DC | PRN

## 2019-12-27 MED ORDER — OXYCODONE HCL 5 MG PO TABS: 5.0000 mg | ORAL_TABLET | ORAL | Status: DC | PRN

## 2019-12-27 MED ORDER — CYCLOBENZAPRINE HCL 10 MG PO TABS
10.0000 mg | ORAL_TABLET | Freq: Three times a day (TID) | ORAL | Status: DC | PRN
  Administered 2019-12-27: 10 mg via ORAL
  Filled 2019-12-27: qty 1

## 2019-12-27 MED ORDER — PROPOFOL 10 MG/ML IV BOLUS
INTRAVENOUS | Status: AC
  Filled 2019-12-27: qty 20

## 2019-12-27 MED ORDER — AMLODIPINE BESYLATE 10 MG PO TABS
10.0000 mg | ORAL_TABLET | Freq: Every day | ORAL | Status: DC
  Administered 2019-12-28: 10 mg via ORAL
  Filled 2019-12-27: qty 1
  Filled 2019-12-27: qty 2

## 2019-12-27 MED ORDER — DOCUSATE SODIUM 100 MG PO CAPS
100.0000 mg | ORAL_CAPSULE | Freq: Two times a day (BID) | ORAL | Status: DC
  Administered 2019-12-27 – 2019-12-28 (×3): 100 mg via ORAL
  Filled 2019-12-27 (×3): qty 1

## 2019-12-27 MED ORDER — ACETAMINOPHEN 160 MG/5ML PO SOLN: 325.0000 mg | Freq: Once | ORAL | Status: DC | PRN

## 2019-12-27 MED ORDER — MAGNESIUM OXIDE 400 (241.3 MG) MG PO TABS
400.0000 mg | ORAL_TABLET | Freq: Every day | ORAL | Status: DC
  Administered 2019-12-27 – 2019-12-28 (×2): 400 mg via ORAL
  Filled 2019-12-27 (×2): qty 1

## 2019-12-27 MED ORDER — PHENOL 1.4 % MT LIQD: 1.0000 | OROMUCOSAL | Status: DC | PRN

## 2019-12-27 MED ORDER — FENTANYL CITRATE (PF) 100 MCG/2ML IJ SOLN
INTRAMUSCULAR | Status: DC | PRN
  Administered 2019-12-27: 50 ug via INTRAVENOUS
  Administered 2019-12-27: 100 ug via INTRAVENOUS
  Administered 2019-12-27: 50 ug via INTRAVENOUS

## 2019-12-27 MED ORDER — MORPHINE SULFATE (PF) 4 MG/ML IV SOLN: 4.0000 mg | INTRAVENOUS | Status: DC | PRN

## 2019-12-27 MED ORDER — BUPIVACAINE-EPINEPHRINE 0.5% -1:200000 IJ SOLN
INTRAMUSCULAR | Status: AC
  Filled 2019-12-27: qty 1

## 2019-12-27 MED ORDER — BUPIVACAINE-EPINEPHRINE (PF) 0.5% -1:200000 IJ SOLN
INTRAMUSCULAR | Status: DC | PRN
  Administered 2019-12-27: 10 mL

## 2019-12-27 MED ORDER — ROCURONIUM BROMIDE 10 MG/ML (PF) SYRINGE
PREFILLED_SYRINGE | INTRAVENOUS | Status: DC | PRN
  Administered 2019-12-27: 50 mg via INTRAVENOUS

## 2019-12-27 MED ORDER — THROMBIN 5000 UNITS EX SOLR
CUTANEOUS | Status: AC
  Filled 2019-12-27: qty 5000

## 2019-12-27 MED ORDER — HYDROCODONE-ACETAMINOPHEN 5-325 MG PO TABS
1.0000 | ORAL_TABLET | ORAL | Status: DC | PRN
  Administered 2019-12-28: 1 via ORAL
  Filled 2019-12-27: qty 1

## 2019-12-27 MED ORDER — ONDANSETRON HCL 4 MG/2ML IJ SOLN
INTRAMUSCULAR | Status: DC | PRN
  Administered 2019-12-27: 4 mg via INTRAVENOUS

## 2019-12-27 MED ORDER — POTASSIUM CHLORIDE CRYS ER 20 MEQ PO TBCR
20.0000 meq | EXTENDED_RELEASE_TABLET | Freq: Every day | ORAL | Status: DC
  Administered 2019-12-27 – 2019-12-28 (×2): 20 meq via ORAL
  Filled 2019-12-27 (×2): qty 1

## 2019-12-27 MED ORDER — CHLORHEXIDINE GLUCONATE 0.12 % MT SOLN
OROMUCOSAL | Status: AC
  Filled 2019-12-27: qty 15

## 2019-12-27 MED ORDER — HYDROMORPHONE HCL 1 MG/ML IJ SOLN
0.2500 mg | INTRAMUSCULAR | Status: DC | PRN
  Administered 2019-12-27: 0.5 mg via INTRAVENOUS

## 2019-12-27 MED ORDER — DEXAMETHASONE SODIUM PHOSPHATE 10 MG/ML IJ SOLN
INTRAMUSCULAR | Status: AC
  Filled 2019-12-27: qty 1

## 2019-12-27 MED ORDER — PANTOPRAZOLE SODIUM 40 MG PO TBEC
40.0000 mg | DELAYED_RELEASE_TABLET | Freq: Every day | ORAL | Status: DC
  Administered 2019-12-27 – 2019-12-28 (×2): 40 mg via ORAL
  Filled 2019-12-27 (×2): qty 1

## 2019-12-27 MED ORDER — BUPROPION HCL ER (SR) 150 MG PO TB12
150.0000 mg | ORAL_TABLET | Freq: Every day | ORAL | Status: DC
  Administered 2019-12-27 – 2019-12-28 (×2): 150 mg via ORAL
  Filled 2019-12-27 (×2): qty 1

## 2019-12-27 MED ORDER — SODIUM CHLORIDE 0.9% FLUSH: 3.0000 mL | INTRAVENOUS | Status: DC | PRN

## 2019-12-27 MED ORDER — BISACODYL 10 MG RE SUPP: 10.0000 mg | Freq: Every day | RECTAL | Status: DC | PRN

## 2019-12-27 MED ORDER — ACETAMINOPHEN 325 MG PO TABS: 650.0000 mg | ORAL_TABLET | ORAL | Status: DC | PRN

## 2019-12-27 MED ORDER — THROMBIN 5000 UNITS EX SOLR: OROMUCOSAL | Status: DC | PRN

## 2019-12-27 MED ORDER — HYDROMORPHONE HCL 1 MG/ML IJ SOLN
INTRAMUSCULAR | Status: AC
  Administered 2019-12-27: 0.5 mg via INTRAVENOUS
  Filled 2019-12-27: qty 1

## 2019-12-27 MED ORDER — LIDOCAINE 2% (20 MG/ML) 5 ML SYRINGE
INTRAMUSCULAR | Status: AC
  Filled 2019-12-27: qty 5

## 2019-12-27 MED ORDER — EVOLOCUMAB 140 MG/ML ~~LOC~~ SOAJ: 140.0000 mg | SUBCUTANEOUS | Status: DC

## 2019-12-27 MED ORDER — ONDANSETRON HCL 4 MG/2ML IJ SOLN
INTRAMUSCULAR | Status: AC
  Filled 2019-12-27: qty 2

## 2019-12-27 MED ORDER — MENTHOL 3 MG MT LOZG: 1.0000 | LOZENGE | OROMUCOSAL | Status: DC | PRN

## 2019-12-27 MED ORDER — CHLORHEXIDINE GLUCONATE 0.12 % MT SOLN: 15.0000 mL | Freq: Once | OROMUCOSAL | Status: DC

## 2019-12-27 MED ORDER — FENTANYL CITRATE (PF) 250 MCG/5ML IJ SOLN
INTRAMUSCULAR | Status: AC
  Filled 2019-12-27: qty 5

## 2019-12-27 MED ORDER — ROCURONIUM BROMIDE 10 MG/ML (PF) SYRINGE
PREFILLED_SYRINGE | INTRAVENOUS | Status: AC
  Filled 2019-12-27: qty 10

## 2019-12-27 MED ORDER — BENAZEPRIL HCL 40 MG PO TABS
40.0000 mg | ORAL_TABLET | Freq: Every day | ORAL | Status: DC
  Administered 2019-12-27 – 2019-12-28 (×2): 40 mg via ORAL
  Filled 2019-12-27 (×2): qty 1

## 2019-12-27 MED ORDER — CHLORHEXIDINE GLUCONATE CLOTH 2 % EX PADS: 6.0000 | MEDICATED_PAD | Freq: Once | CUTANEOUS | Status: DC

## 2019-12-27 MED ORDER — DEXAMETHASONE SODIUM PHOSPHATE 10 MG/ML IJ SOLN
INTRAMUSCULAR | Status: DC | PRN
  Administered 2019-12-27: 10 mg via INTRAVENOUS

## 2019-12-27 MED ORDER — PHENYLEPHRINE HCL (PRESSORS) 10 MG/ML IV SOLN
INTRAVENOUS | Status: DC | PRN
  Administered 2019-12-27 (×2): 40 ug via INTRAVENOUS
  Administered 2019-12-27: 80 ug via INTRAVENOUS

## 2019-12-27 MED ORDER — OXYCODONE HCL 5 MG PO TABS: 10.0000 mg | ORAL_TABLET | ORAL | Status: DC | PRN

## 2019-12-27 MED ORDER — BACITRACIN ZINC 500 UNIT/GM EX OINT
TOPICAL_OINTMENT | CUTANEOUS | Status: DC | PRN
  Administered 2019-12-27: 1 via TOPICAL

## 2019-12-27 MED ORDER — LACTATED RINGERS IV SOLN: INTRAVENOUS | Status: DC | PRN

## 2019-12-27 MED ORDER — CEFAZOLIN SODIUM-DEXTROSE 2-4 GM/100ML-% IV SOLN
2.0000 g | Freq: Three times a day (TID) | INTRAVENOUS | Status: AC
  Administered 2019-12-27 (×2): 2 g via INTRAVENOUS
  Filled 2019-12-27 (×2): qty 100

## 2019-12-27 MED ORDER — ONDANSETRON HCL 4 MG/2ML IJ SOLN: 4.0000 mg | Freq: Four times a day (QID) | INTRAMUSCULAR | Status: DC | PRN

## 2019-12-27 MED ORDER — ACETAMINOPHEN 10 MG/ML IV SOLN: 1000.0000 mg | Freq: Once | INTRAVENOUS | Status: DC | PRN

## 2019-12-27 MED ORDER — LIDOCAINE 2% (20 MG/ML) 5 ML SYRINGE
INTRAMUSCULAR | Status: DC | PRN
  Administered 2019-12-27: 100 mg via INTRAVENOUS

## 2019-12-27 MED ORDER — BACITRACIN ZINC 500 UNIT/GM EX OINT
TOPICAL_OINTMENT | CUTANEOUS | Status: AC
  Filled 2019-12-27: qty 28.35

## 2019-12-27 SURGICAL SUPPLY — 54 items
BAG DECANTER FOR FLEXI CONT (MISCELLANEOUS) ×2 IMPLANT
BAND RUBBER #18 3X1/16 STRL (MISCELLANEOUS) IMPLANT
BENZOIN TINCTURE PRP APPL 2/3 (GAUZE/BANDAGES/DRESSINGS) ×2 IMPLANT
BLADE CLIPPER SURG (BLADE) ×2 IMPLANT
BUR MATCHSTICK NEURO 3.0 LAGG (BURR) ×2 IMPLANT
BUR PRECISION FLUTE 6.0 (BURR) ×2 IMPLANT
CANISTER SUCT 3000ML PPV (MISCELLANEOUS) ×2 IMPLANT
CARTRIDGE OIL MAESTRO DRILL (MISCELLANEOUS) ×1 IMPLANT
COVER WAND RF STERILE (DRAPES) IMPLANT
DIFFUSER DRILL AIR PNEUMATIC (MISCELLANEOUS) ×2 IMPLANT
DRAPE LAPAROTOMY 100X72X124 (DRAPES) ×2 IMPLANT
DRAPE MICROSCOPE LEICA (MISCELLANEOUS) IMPLANT
DRAPE SURG 17X23 STRL (DRAPES) ×8 IMPLANT
DRSG OPSITE POSTOP 4X6 (GAUZE/BANDAGES/DRESSINGS) ×2 IMPLANT
ELECT BLADE 4.0 EZ CLEAN MEGAD (MISCELLANEOUS) ×2
ELECT REM PT RETURN 9FT ADLT (ELECTROSURGICAL) ×2
ELECTRODE BLDE 4.0 EZ CLN MEGD (MISCELLANEOUS) ×1 IMPLANT
ELECTRODE REM PT RTRN 9FT ADLT (ELECTROSURGICAL) ×1 IMPLANT
GAUZE 4X4 16PLY RFD (DISPOSABLE) IMPLANT
GAUZE SPONGE 4X4 12PLY STRL (GAUZE/BANDAGES/DRESSINGS) IMPLANT
GLOVE BIO SURGEON STRL SZ 6.5 (GLOVE) ×2 IMPLANT
GLOVE BIO SURGEON STRL SZ8 (GLOVE) ×2 IMPLANT
GLOVE BIO SURGEON STRL SZ8.5 (GLOVE) ×2 IMPLANT
GLOVE BIOGEL PI IND STRL 6.5 (GLOVE) ×1 IMPLANT
GLOVE BIOGEL PI IND STRL 7.0 (GLOVE) ×1 IMPLANT
GLOVE BIOGEL PI IND STRL 8 (GLOVE) ×4 IMPLANT
GLOVE BIOGEL PI INDICATOR 6.5 (GLOVE) ×1
GLOVE BIOGEL PI INDICATOR 7.0 (GLOVE) ×1
GLOVE BIOGEL PI INDICATOR 8 (GLOVE) ×4
GLOVE ECLIPSE 7.5 STRL STRAW (GLOVE) ×6 IMPLANT
GLOVE EXAM NITRILE XL STR (GLOVE) IMPLANT
GOWN STRL REUS W/ TWL LRG LVL3 (GOWN DISPOSABLE) ×1 IMPLANT
GOWN STRL REUS W/ TWL XL LVL3 (GOWN DISPOSABLE) ×1 IMPLANT
GOWN STRL REUS W/TWL 2XL LVL3 (GOWN DISPOSABLE) ×4 IMPLANT
GOWN STRL REUS W/TWL LRG LVL3 (GOWN DISPOSABLE) ×2
GOWN STRL REUS W/TWL XL LVL3 (GOWN DISPOSABLE) ×2
KIT BASIN OR (CUSTOM PROCEDURE TRAY) ×2 IMPLANT
KIT TURNOVER KIT B (KITS) ×2 IMPLANT
NEEDLE HYPO 21X1.5 SAFETY (NEEDLE) IMPLANT
NEEDLE HYPO 22GX1.5 SAFETY (NEEDLE) ×2 IMPLANT
NS IRRIG 1000ML POUR BTL (IV SOLUTION) ×2 IMPLANT
OIL CARTRIDGE MAESTRO DRILL (MISCELLANEOUS) ×2
PACK LAMINECTOMY NEURO (CUSTOM PROCEDURE TRAY) ×2 IMPLANT
PAD ARMBOARD 7.5X6 YLW CONV (MISCELLANEOUS) ×16 IMPLANT
PATTIES SURGICAL .5 X1 (DISPOSABLE) IMPLANT
SPONGE SURGIFOAM ABS GEL SZ50 (HEMOSTASIS) IMPLANT
STRIP CLOSURE SKIN 1/2X4 (GAUZE/BANDAGES/DRESSINGS) ×2 IMPLANT
SUT VIC AB 1 CT1 18XBRD ANBCTR (SUTURE) ×2 IMPLANT
SUT VIC AB 1 CT1 8-18 (SUTURE) ×4
SUT VIC AB 2-0 CP2 18 (SUTURE) ×4 IMPLANT
SYR BULB IRRIG 60ML STRL (SYRINGE) ×2 IMPLANT
TOWEL GREEN STERILE (TOWEL DISPOSABLE) ×2 IMPLANT
TOWEL GREEN STERILE FF (TOWEL DISPOSABLE) ×2 IMPLANT
WATER STERILE IRR 1000ML POUR (IV SOLUTION) ×2 IMPLANT

## 2019-12-27 NOTE — Progress Notes (Signed)
   Providing Compassionate, Quality Care - Together   Subjective: Patient reports he feels "great." He has been ambulating in the hall. He feels like his pain is well-controlled. He is sitting up on the edge of the bed eating dinner.  Objective: Vital signs in last 24 hours: Temp:  [97.4 F (36.3 C)-98.5 F (36.9 C)] 97.8 F (36.6 C) (06/09 1614) Pulse Rate:  [64-78] 78 (06/09 1614) Resp:  [15-20] 16 (06/09 1614) BP: (138-152)/(74-92) 151/89 (06/09 1614) SpO2:  [94 %-99 %] 97 % (06/09 1614) Weight:  [95.3 kg] 95.3 kg (06/09 0712)  Intake/Output from previous day: No intake/output data recorded. Intake/Output this shift: Total I/O In: 750 [I.V.:650; IV Piggyback:100] Out: 100 [Blood:100]  Alert and oriented x 4 PERRLA CN II-XII grossly intact MAE, Strength and sensation intact Incision is covered with Honeycomb dressing and Steri Strips; Dressing is clean, dry, and intact   Lab Results: No results for input(s): WBC, HGB, HCT, PLT in the last 72 hours. BMET No results for input(s): NA, K, CL, CO2, GLUCOSE, BUN, CREATININE, CALCIUM in the last 72 hours.  Studies/Results: DG Lumbar Spine 1 View  Result Date: 12/27/2019 CLINICAL DATA:  Localization image in or. EXAM: LUMBAR SPINE - 1 VIEW COMPARISON:  Lumbar myelogram 10/04/2019 FINDINGS: Standard segmentation assumed as on prior lumbar myelogram. Surgical instruments localize posterior to L4 vertebral body. IMPRESSION: Surgical instruments localize posterior to the L4 vertebral body. Electronically Signed   By: Keith Rake M.D.   On: 12/27/2019 14:55    Assessment/Plan: Patient underwent an L2-3, L3-4, and L4-5 laminotomy/foraminotomy by Dr. Arnoldo Morale earlier today. He is doing very well.   LOS: 0 days    -Continue to mobilize -Likely discharge tomorrow morning    Viona Gilmore, DNP, AGNP-C Nurse Practitioner  [facility] [place]. [address], [address], [place], [place] [address] P:  [phone]    F: [phone]  12/27/2019, 5:36 PM

## 2019-12-27 NOTE — Op Note (Signed)
Brief history: The patient is a 79 year old black male who has complained of back and right greater left leg pain consistent with neurogenic claudication.  He has failed medical management and was worked up with a lumbar myelo CT which demonstrated multilevel degenerative changes and spinal stenosis.  I discussed the various treatment options with him including surgery.  He has weighed the risks, benefits and alternatives of surgery and decided proceed with bilateral lumbar laminotomies and foraminotomies.  Initially we discussed doing L2-3 and L3-4 but upon further review his myelo CT I thought we should include L4-5 as well.   Preoperative diagnosis: Lumbar spinal stenosis, lumbago, lumbar radiculopathy, neurogenic claudication  Postoperative diagnosis: The same  Procedure: Bilateral L2-3, L3-4 and L4-5 laminotomy/foraminotomy   Surgeon: Dr. Earle Gell  Asst.: Arnetha Massy, NP  Anesthesia: Gen. endotracheal  Estimated blood loss: 100 cc  Drains: None  Complications: None  Description of procedure: The patient was brought to the operating room by the anesthesia team. General endotracheal anesthesia was induced. The patient was turned to the prone position on the Wilson frame. The patient's lumbosacral region was then prepared with Betadine scrub and Betadine solution. Sterile drapes were applied.  I then injected the area to be incised with Marcaine with epinephrine solution. I then used a scalpel to make a linear midline incision over the L2-3, L3-4 and L4-5 intervertebral disc space. I then used electrocautery to perform a bilateral subperiosteal dissection exposing the spinous process and lamina of L2, L3, L4 and L5. We obtained intraoperative radiograph to confirm our location. I then inserted the [facility] retractor for exposure.  I used a high-speed drill to perform a laminotomy at L2-3, L3-4 and L4-5 bilaterally. I then used a Kerrison punches to widen the laminotomy and removed  the ligamentum flavum at L2-3, L3-4 and L4-5 bilaterally. We then freed up the thecal sac and the bilateral L3, L4 and L5 nerve root from the epidural tissue. I then used a Kerrison punch to perform a foraminotomy at about the bilateral L3, L4 and L5 nerve root.  We inspected the intervertebral disc bilaterally at L2-3, L3-4 and L4-5.  There were no herniations.  I then palpated along the ventral surface of the thecal sac and along exit route of the bilateral L3, L4 and L5 nerve root and noted that the neural structures were well decompressed. This completed the decompression.  We then obtained hemostasis using bipolar electrocautery. We irrigated the wound out with bacitracin solution. We then removed the retractor. We then reapproximated the patient's thoracolumbar fascia with interrupted #1 Vicryl suture. We then reapproximated the patient's subcutaneous tissue with interrupted 2-0 Vicryl suture. We then reapproximated patient's skin with Steri-Strips and benzoin. The was then coated with bacitracin ointment. The drapes were removed. The patient was subsequently returned to the supine position where they were extubated by the anesthesia team. The patient was then transported to the postanesthesia care unit in stable condition. All sponge instrument and needle counts were reportedly correct at the end of this case.

## 2019-12-27 NOTE — Anesthesia Procedure Notes (Signed)
Procedure Name: Intubation Date/Time: 12/27/2019 9:33 AM Performed by: Kyung Rudd, CRNA Pre-anesthesia Checklist: Patient identified, Emergency Drugs available, Suction available and Patient being monitored Patient Re-evaluated:Patient Re-evaluated prior to induction Oxygen Delivery Method: Circle system utilized Preoxygenation: Pre-oxygenation with 100% oxygen Induction Type: IV induction Ventilation: Mask ventilation without difficulty and Oral airway inserted - appropriate to patient size Laryngoscope Size: Mac and 4 Grade View: Grade II Tube type: Oral Tube size: 7.5 mm Number of attempts: 1 Airway Equipment and Method: Stylet Placement Confirmation: ETT inserted through vocal cords under direct vision,  positive ETCO2 and breath sounds checked- equal and bilateral Secured at: 21 cm Tube secured with: Tape Dental Injury: Teeth and Oropharynx as per pre-operative assessment

## 2019-12-27 NOTE — Anesthesia Postprocedure Evaluation (Signed)
Anesthesia Post Note  Patient: Douglas Keller  Procedure(s) Performed: Romie Minus LUMBAR TWO- LUMBAR THREE, LUMBAR THREE- LUMBAR FOUR, LUMBAR FOUR-FIVE LAMINOTOMY/FORAMINOTOMY 3 LEVELS (Bilateral Spine Lumbar)     Patient location during evaluation: PACU Anesthesia Type: General Level of consciousness: awake and alert Pain management: pain level controlled Vital Signs Assessment: post-procedure vital signs reviewed and stable Respiratory status: spontaneous breathing, nonlabored ventilation, respiratory function stable and patient connected to nasal cannula oxygen Cardiovascular status: blood pressure returned to baseline and stable Postop Assessment: no apparent nausea or vomiting Anesthetic complications: no    Last Vitals:  Vitals:   12/27/19 1300 12/27/19 1336  BP:  (!) 152/92  Pulse:  64  Resp:  20  Temp: 36.6 C 36.9 C  SpO2:  97%    Last Pain:  Vitals:   12/27/19 1336  TempSrc: Oral  PainSc:                  Effie Berkshire

## 2019-12-27 NOTE — H&P (Signed)
Subjective: The patient is a 79 year old black male who has complained of back and right greater left leg pain.  He failed medical management and was worked up with a lumbar MRI.  This demonstrated spinal stenosis.  He  has decided to proceed with surgery.  Past Medical History:  Diagnosis Date  . Aortic valve sclerosis   . Atherosclerosis of coronary artery without angina pectoris 07/05/2014  . Cervical spondylosis without myelopathy 02/13/2013  . Chest pain 01/29/2019  . Degeneration of cervical intervertebral disc 02/13/2013  . Depression    sometimes  . Diabetes mellitus type 2, diet-controlled ([place])   . Elevated troponin   . GERD (gastroesophageal reflux disease)   . Heart murmur   . HTN (hypertension) 07/06/2013  . Hypercholesterolemia 08/21/2015  . Hyperlipidemia 07/06/2013  . Pre-diabetes   . Stiffness of left shoulder joint 07/19/2013    Past Surgical History:  Procedure Laterality Date  . BIOPSY  12/14/2019   Procedure: BIOPSY;  Surgeon: Daneil Dolin, MD;  [facility];  Service: Endoscopy;;  . CERVICAL SPINE SURGERY    . COLONOSCOPY  07/26/2012   Procedure: COLONOSCOPY;  Surgeon: Jamesetta So, MD;  [facility];  Service: Gastroenterology;  Laterality: N/A;  . COLONOSCOPY WITH PROPOFOL N/A 12/14/2019   Procedure: COLONOSCOPY WITH PROPOFOL;  Surgeon: Daneil Dolin, MD;  [facility];  Service: Endoscopy;  Laterality: N/A;  9:00am  . KNEE ARTHROSCOPY Left    torn ligament    Allergies  Allergen Reactions  . Statins Other (See Comments)    Muscle/joint pain     Social History   Tobacco Use  . Smoking status: Never Smoker  . Smokeless tobacco: Never Used  Substance Use Topics  . Alcohol use: No    Family History  Problem Relation Age of Onset  . Asthma Mother   . Hypertension Father   . Colon cancer Neg Hx    Prior to Admission medications   Medication Sig Start Date End Date Taking? Authorizing Provider  acetaminophen  (TYLENOL) 650 MG CR tablet Take 1,300 mg by mouth every 8 (eight) hours as needed for pain.   Yes [provider]  amLODipine (NORVASC) 10 MG tablet Take 10 mg by mouth daily.   Yes [provider]  aspirin EC 81 MG tablet Take 81 mg by mouth daily.   Yes [provider]  benazepril (LOTENSIN) 40 MG tablet Take 40 mg by mouth daily.   Yes [provider]  buPROPion (ZYBAN) 150 MG 12 hr tablet Take 150 mg by mouth daily.   Yes [provider]  Evolocumab (REPATHA SURECLICK) 979 MG/ML SOAJ Inject 140 mg into the skin every 14 (fourteen) days.   Yes [provider]  gabapentin (NEURONTIN) 300 MG capsule Take 300 mg by mouth 3 (three) times daily.   Yes [provider]  GLUCOSAMINE HCL PO Take 1 tablet by mouth at bedtime.   Yes [provider]  magnesium oxide (MAG-OX) 400 MG tablet Take 400 mg by mouth daily.   Yes [provider]  meclizine (ANTIVERT) 12.5 MG tablet Take 1 tablet (12.5 mg total) by mouth 2 (two) times daily as needed for dizziness. 12/26/19  Yes Perlie Mayo, NP  multivitamin-iron-minerals-folic acid (CENTRUM) chewable tablet Chew 1 tablet by mouth daily.   Yes [provider]  pantoprazole (PROTONIX) 40 MG tablet Take 40 mg by mouth daily.   Yes [provider]  potassium chloride SA (KLOR-CON)  20 MEQ tablet Take 20 mEq by mouth daily.   Yes [provider]  spironolactone-hydrochlorothiazide (ALDACTAZIDE) 25-25 MG tablet Take 1 tablet by mouth daily.   Yes [provider]     Review of Systems  Positive ROS: As above  All other systems have been reviewed and were otherwise negative with the exception of those mentioned in the HPI and as above.  Objective: Vital signs in last 24 hours: Temp:  [98.5 F (36.9 C)] 98.5 F (36.9 C) (06/09 0712) Pulse Rate:  [65] 65 (06/09 0712) Resp:  [17] 17 (06/09 0712) BP: (151)/(76) 151/76 (06/09 0712) SpO2:  [99 %] 99 %  (06/09 0712) Weight:  [95.3 kg] 95.3 kg (06/09 0712) Estimated body mass index is 33.89 kg/m as calculated from the following:   Height as of this encounter: 5\' 6"  (1.676 m).   Weight as of this encounter: 95.3 kg.   General Appearance: Alert Head: Normocephalic, without obvious abnormality, atraumatic Eyes: PERRL, conjunctiva/corneas clear, EOM's intact,    Ears: Normal  Throat: Normal  Neck: Supple, Back: unremarkable Lungs: Clear to auscultation bilaterally, respirations unlabored Heart: Regular rate and rhythm, no murmur, rub or gallop Abdomen: Soft, non-tender Extremities: Extremities normal, atraumatic, no cyanosis or edema Skin: unremarkable  NEUROLOGIC:   Mental status: alert and oriented,Motor Exam - grossly normal Sensory Exam - grossly normal Reflexes:  Coordination - grossly normal Gait - grossly normal Balance - grossly normal Cranial Nerves: I: smell Not tested  II: visual acuity  OS: Normal  OD: Normal   II: visual fields Full to confrontation  II: pupils Equal, round, reactive to light  III,VII: ptosis None  III,IV,VI: extraocular muscles  Full ROM  V: mastication Normal  V: facial light touch sensation  Normal  V,VII: corneal reflex  Present  VII: facial muscle function - upper  Normal  VII: facial muscle function - lower Normal  VIII: hearing Not tested  IX: soft palate elevation  Normal  IX,X: gag reflex Present  XI: trapezius strength  5/5  XI: sternocleidomastoid strength 5/5  XI: neck flexion strength  5/5  XII: tongue strength  Normal    Data Review Lab Results  Component Value Date   WBC 8.1 08/17/2019   HGB 13.7 08/17/2019   HCT 42.3 08/17/2019   MCV 94.4 08/17/2019   PLT 295 08/17/2019   Lab Results  Component Value Date   NA 138 12/12/2019   K 3.8 12/12/2019   CL 104 12/12/2019   CO2 21 (L) 12/12/2019   BUN 17 12/12/2019   CREATININE 1.23 12/12/2019   GLUCOSE 151 (H) 12/12/2019   No results found for: INR,  PROTIME  Assessment/Plan: Lumbar spinal stenosis lumbar radiculopathy, neurogenic claudication: I have discussed the situation with the patient.  I have reviewed his imaging studies with him and pointed out the abnormalities.  We have discussed the various treatment options including surgery.   he has decided to proceed with surgery.   Ophelia Charter 12/27/2019 9:18 AM

## 2019-12-27 NOTE — Transfer of Care (Signed)
Immediate Anesthesia Transfer of Care Note  Patient: Douglas Keller  Procedure(s) Performed: Romie Minus LUMBAR TWO- LUMBAR THREE, LUMBAR THREE- LUMBAR FOUR, LUMBAR FOUR-FIVE LAMINOTOMY/FORAMINOTOMY 3 LEVELS (Bilateral Spine Lumbar)  Patient Location: PACU  Anesthesia Type:General  Level of Consciousness: awake, alert  and oriented  Airway & Oxygen Therapy: Patient Spontanous Breathing and Patient connected to nasal cannula oxygen  Post-op Assessment: Report given to RN, Post -op Vital signs reviewed and stable and Patient moving all extremities X 4  Post vital signs: Reviewed and stable  Last Vitals:  Vitals Value Taken Time  BP 147/74 12/27/19 1217  Temp    Pulse 74 12/27/19 1218  Resp 16 12/27/19 1218  SpO2 95 % 12/27/19 1218  Vitals shown include unvalidated device data.  Last Pain:  Vitals:   12/27/19 0808  TempSrc:   PainSc: 0-No pain         Complications: No apparent anesthesia complications

## 2019-12-28 ENCOUNTER — Encounter (HOSPITAL_COMMUNITY): Payer: Self-pay | Admitting: Neurosurgery

## 2019-12-28 DIAGNOSIS — I2510 Atherosclerotic heart disease of native coronary artery without angina pectoris: Secondary | ICD-10-CM | POA: Diagnosis not present

## 2019-12-28 DIAGNOSIS — K219 Gastro-esophageal reflux disease without esophagitis: Secondary | ICD-10-CM | POA: Diagnosis not present

## 2019-12-28 DIAGNOSIS — M48062 Spinal stenosis, lumbar region with neurogenic claudication: Secondary | ICD-10-CM | POA: Diagnosis not present

## 2019-12-28 DIAGNOSIS — Z7982 Long term (current) use of aspirin: Secondary | ICD-10-CM | POA: Diagnosis not present

## 2019-12-28 DIAGNOSIS — E119 Type 2 diabetes mellitus without complications: Secondary | ICD-10-CM | POA: Diagnosis not present

## 2019-12-28 DIAGNOSIS — F329 Major depressive disorder, single episode, unspecified: Secondary | ICD-10-CM | POA: Diagnosis not present

## 2019-12-28 DIAGNOSIS — Z79899 Other long term (current) drug therapy: Secondary | ICD-10-CM | POA: Diagnosis not present

## 2019-12-28 DIAGNOSIS — M4726 Other spondylosis with radiculopathy, lumbar region: Secondary | ICD-10-CM | POA: Diagnosis not present

## 2019-12-28 DIAGNOSIS — I10 Essential (primary) hypertension: Secondary | ICD-10-CM | POA: Diagnosis not present

## 2019-12-28 MED ORDER — SPIRONOLACTONE 25 MG PO TABS
25.0000 mg | ORAL_TABLET | Freq: Every day | ORAL | Status: DC
  Administered 2019-12-28: 25 mg via ORAL
  Filled 2019-12-28: qty 1

## 2019-12-28 MED ORDER — DOCUSATE SODIUM 100 MG PO CAPS: 100.0000 mg | ORAL_CAPSULE | Freq: Two times a day (BID) | ORAL | 0 refills | Status: DC

## 2019-12-28 MED ORDER — CYCLOBENZAPRINE HCL 10 MG PO TABS: 10.0000 mg | ORAL_TABLET | Freq: Three times a day (TID) | ORAL | 0 refills | Status: DC | PRN

## 2019-12-28 MED ORDER — HYDROCHLOROTHIAZIDE 25 MG PO TABS
25.0000 mg | ORAL_TABLET | Freq: Every day | ORAL | Status: DC
  Administered 2019-12-28: 25 mg via ORAL
  Filled 2019-12-28: qty 1

## 2019-12-28 MED ORDER — HYDROCODONE-ACETAMINOPHEN 5-325 MG PO TABS: 1.0000 | ORAL_TABLET | ORAL | 0 refills | Status: DC | PRN

## 2019-12-28 NOTE — Discharge Instructions (Addendum)
Wound Care Keep incision covered and dry for three days.    Do not put any creams, lotions, or ointments on incision. Leave steri-strips on back.  They will fall off by themselves. Activity Walk each and every day, increasing distance each day. No lifting greater than 5 lbs. No driving for 2 weeks; may ride as a passenger locally. Diet Resume your normal diet.  Return to Work Will be discussed at your follow up appointment. Call Your Doctor If Any of These Occur Redness, drainage, or swelling at the wound.  Temperature greater than 101 degrees. Severe pain not relieved by pain medication. Incision starts to come apart. Follow Up Appt Call today for appointment in 2 weeks [phone]) or for problems.

## 2019-12-28 NOTE — Evaluation (Signed)
Occupational Therapy Evaluation and Discharge Patient Details Name: Douglas Keller MRN: [id number] DOB: Oct 17, 1940 Today's Date: 12/28/2019    History of Present Illness Pt is a 79 y/o male s/p L2-L5 laminectomies. PMH including but not limited to DM, HTN and Aortic valve sclerosis.   Clinical Impression   All education completed with pt verbalizing and/or demonstrating understanding of back precautions and compensatory strategies for ADL. No further OT needs.    Follow Up Recommendations  No OT follow up    Equipment Recommendations  None recommended by OT    Recommendations for Other Services       Precautions / Restrictions Precautions Precautions: Back Precaution Booklet Issued: Yes (comment) Precaution Comments: reviewed back precautions related to ADL and IADL Required Braces or Orthoses:  ("no brace" MD order) Restrictions Weight Bearing Restrictions: No      Mobility Bed Mobility               General bed mobility comments: pt OOB in recliner chair upon arrival  Transfers Overall transfer level: Needs assistance Equipment used: None Transfers: Sit to/from Stand Sit to Stand: Supervision         General transfer comment: for safety    Balance Overall balance assessment: No apparent balance deficits (not formally assessed)                                         ADL either performed or assessed with clinical judgement   ADL Overall ADL's : Modified independent                                       General ADL Comments: Educated at length in back precautions related to ADL and IADL, body mechanics and IADL to avoid.     Vision Patient Visual Report: No change from baseline       Perception     Praxis      Pertinent Vitals/Pain Pain Assessment: Faces Pain Score: 2  Faces Pain Scale: Hurts a little bit Pain Location: back Pain Descriptors / Indicators: Sore Pain Intervention(s): Monitored during  session;Premedicated before session     Hand Dominance Right   Extremity/Trunk Assessment Upper Extremity Assessment Upper Extremity Assessment: Overall WFL for tasks assessed   Lower Extremity Assessment Lower Extremity Assessment: Defer to PT evaluation   Cervical / Trunk Assessment Cervical / Trunk Assessment: Other exceptions Cervical / Trunk Exceptions: s/p lumbar sx   Communication Communication Communication: No difficulties   Cognition Arousal/Alertness: Awake/alert Behavior During Therapy: WFL for tasks assessed/performed Overall Cognitive Status: Within Functional Limits for tasks assessed                                     General Comments       Exercises     Shoulder Instructions      Home Living Family/patient expects to be discharged to:: Private residence Living Arrangements: Alone Available Help at Discharge: Family;Available 24 hours/day ("for a week") Type of Home: House Home Access: Level entry     Home Layout: Two level Alternate Level Stairs-Number of Steps: 8-10 Alternate Level Stairs-Rails: Right;Left Bathroom Shower/Tub: Tub/shower unit   Bathroom Toilet: Handicapped height     Home Equipment: Environmental consultant - 2 wheels;Cane -  single point;Shower seat;Adaptive equipment Adaptive Equipment: Reacher;Long-handled shoe horn        Prior Functioning/Environment Level of Independence: Independent                 OT Problem List:        OT Treatment/Interventions:      OT Goals(Current goals can be found in the care plan section) Acute Rehab OT Goals Patient Stated Goal: "home today"  OT Frequency:     Barriers to D/C:            Co-evaluation              AM-PAC OT "6 Clicks" Daily Activity     Outcome Measure Help from another person eating meals?: None Help from another person taking care of personal grooming?: None Help from another person toileting, which includes using toliet, bedpan, or urinal?:  None Help from another person bathing (including washing, rinsing, drying)?: None Help from another person to put on and taking off regular upper body clothing?: None Help from another person to put on and taking off regular lower body clothing?: None 6 Click Score: 24   End of Session    Activity Tolerance: Patient tolerated treatment well Patient left: in chair;with call bell/phone within reach  OT Visit Diagnosis: Pain                Time: 8864-8472 OT Time Calculation (min): 13 min Charges:  OT General Charges $OT Visit: 1 Visit OT Evaluation $OT Eval Low Complexity: 1 Low  Nestor Lewandowsky, OTR/L Acute Rehabilitation Services Pager: [phone] Office: [phone] Malka So 12/28/2019, 10:51 AM

## 2019-12-28 NOTE — Evaluation (Signed)
Physical Therapy Evaluation & Discharge Patient Details Name: Douglas Keller MRN: [id number] DOB: 06-Dec-1940 Today's Date: 12/28/2019   History of Present Illness  Pt is a 79 y/o male s/p L2-L5 laminectomies. PMH including but not limited to DM, HTN and Aortic valve sclerosis.  Clinical Impression  Pt presented sitting up in recliner chair, awake and willing to participate in therapy session. Prior to admission, pt reported that he was independent with all functional mobility and ADLs. Pt lives alone in a two level home with a level entry. His daughter and son-in-law will be staying with him until 6/13. At the time of evaluation, pt overall moving very well without need for physical assistance or use of an AD. He tolerated hallway ambulation without any difficulties and participated in stair training as well. PT provided pt education re: back precautions with handout provided, car transfers and a generalized walking program for pt to initiate upon d/c home. Plan is to d/c home today with family support. No further acute PT needs identified at this time. PT signing off.     Follow Up Recommendations No PT follow up    Equipment Recommendations  None recommended by PT    Recommendations for Other Services       Precautions / Restrictions Precautions Precautions: Back Precaution Booklet Issued: Yes (comment) Precaution Comments: reviewed handout with pt throughout Required Braces or Orthoses:  ("no brace" MD order) Restrictions Weight Bearing Restrictions: No      Mobility  Bed Mobility               General bed mobility comments: pt OOB in recliner chair upon arrival  Transfers Overall transfer level: Needs assistance Equipment used: None Transfers: Sit to/from Stand Sit to Stand: Supervision         General transfer comment: for safety  Ambulation/Gait Ambulation/Gait assistance: Supervision Gait Distance (Feet): 500 Feet Assistive device: None Gait  Pattern/deviations: Step-through pattern;Decreased stride length Gait velocity: decreased   General Gait Details: pt steady overall in hallway without use of an AD; no LOB or need for physical assistance  Stairs Stairs: Yes Stairs assistance: Min guard Stair Management: One rail Left;Alternating pattern;Forwards Number of Stairs: 10 General stair comments: pt performed without difficulties  Wheelchair Mobility    Modified Rankin (Stroke Patients Only)       Balance Overall balance assessment: No apparent balance deficits (not formally assessed)                                           Pertinent Vitals/Pain Pain Assessment: 0-10 Pain Score: 2  Pain Location: back Pain Descriptors / Indicators: Sore Pain Intervention(s): Monitored during session;Repositioned    Home Living Family/patient expects to be discharged to:: Private residence Living Arrangements: Alone Available Help at Discharge: Family;Other (Comment) (daughter and son-in-law are staying until 6/13) Type of Home: House Home Access: Level entry     Home Layout: Two level Home Equipment: [place] - 2 wheels;Cane - single point;Shower seat      Prior Function Level of Independence: Independent               Hand Dominance        Extremity/Trunk Assessment   Upper Extremity Assessment Upper Extremity Assessment: Defer to OT evaluation;Overall [facility] for tasks assessed    Lower Extremity Assessment Lower Extremity Assessment: Overall WFL for tasks assessed    Cervical /  Trunk Assessment Cervical / Trunk Assessment: Other exceptions Cervical / Trunk Exceptions: s/p lumbar sx  Communication   Communication: No difficulties  Cognition Arousal/Alertness: Awake/alert Behavior During Therapy: WFL for tasks assessed/performed Overall Cognitive Status: Within Functional Limits for tasks assessed                                        General Comments       Exercises     Assessment/Plan    PT Assessment Patent does not need any further PT services  PT Problem List         PT Treatment Interventions      PT Goals (Current goals can be found in the Care Plan section)  Acute Rehab PT Goals Patient Stated Goal: "home today" PT Goal Formulation: All assessment and education complete, DC therapy    Frequency     Barriers to discharge        Co-evaluation               AM-PAC PT "6 Clicks" Mobility  Outcome Measure Help needed turning from your back to your side while in a flat bed without using bedrails?: None Help needed moving from lying on your back to sitting on the side of a flat bed without using bedrails?: None Help needed moving to and from a bed to a chair (including a wheelchair)?: None Help needed standing up from a chair using your arms (e.g., wheelchair or bedside chair)?: None Help needed to walk in hospital room?: None Help needed climbing 3-5 steps with a railing? : None 6 Click Score: 24    End of Session   Activity Tolerance: Patient tolerated treatment well Patient left: in chair;with call bell/phone within reach Nurse Communication: Mobility status PT Visit Diagnosis: Other abnormalities of gait and mobility (R26.89)    Time: 0350-0938 PT Time Calculation (min) (ACUTE ONLY): 14 min   Charges:   PT Evaluation $PT Eval Low Complexity: 1 Low          Eduard Clos, PT, DPT  Acute Rehabilitation Services Pager [phone] Office [place] 12/28/2019, 9:40 AM

## 2019-12-28 NOTE — Discharge Summary (Addendum)
Physician Discharge Summary     Providing Compassionate, Quality Care - Together   [name] MRN: [id number] DOB/AGE: 09/28/1940 79 y.o.  Admit date: 12/27/2019 Discharge date: 12/28/2019  Admission Diagnoses:  Spinal stenosis of lumbar region with neurogenic claudication  Discharge Diagnoses:  Active Problems:   Spinal stenosis of lumbar region with neurogenic claudication   Discharged Condition: good  Hospital Course: Patient underwent an L2-3, L3-4, and L4-5 laminotomy/foraminotomy by Dr. Arnoldo Keller on 12/27/2019. He was admitted to 3C06 overnight for observation. His postoperative course has been uncomplicated. He is ambulating independently and without difficulty. He is tolerating a normal diet. He is not having any bowel or bladder dysfunction. His pain is well-controlled with oral pain medication. He is ready for discharge home.   Consults: rehabilitation medicine  Significant Diagnostic Studies: radiology: DG Lumbar Spine 1 View  Result Date: 12/27/2019 CLINICAL DATA:  Localization image in or. EXAM: LUMBAR SPINE - 1 VIEW COMPARISON:  Lumbar myelogram 10/04/2019 FINDINGS: Standard segmentation assumed as on prior lumbar myelogram. Surgical instruments localize posterior to L4 vertebral body. IMPRESSION: Surgical instruments localize posterior to the L4 vertebral body. Electronically Signed   By: Douglas Keller M.D.   On: 12/27/2019 14:55     Treatments: surgery: Bilateral L2-3, L3-4 and L4-5 laminotomy/foraminotomy   Discharge Exam: Blood pressure 136/83, pulse 60, temperature 98.8 F (37.1 C), temperature source Oral, resp. rate 19, height 5\' 6"  (1.676 m), weight 95.3 kg, SpO2 100 %.   Alert and oriented x 4 PERRLA CN II-XII grossly intact MAE, Strength and sensation intact Incision is covered with Honeycomb dressing and Steri Strips; Dressing is clean, dry, and intact   Disposition: Discharge disposition: 01-Home or Self Care        Allergies  as of 12/28/2019      Reactions   Statins Other (See Comments)   Muscle/joint pain      Medication List    TAKE these medications   acetaminophen 650 MG CR tablet Commonly known as: TYLENOL Take 1,300 mg by mouth every 8 (eight) hours as needed for pain.   amLODipine 10 MG tablet Commonly known as: NORVASC Take 10 mg by mouth daily.   aspirin EC 81 MG tablet Take 81 mg by mouth daily.   benazepril 40 MG tablet Commonly known as: LOTENSIN Take 40 mg by mouth daily.   buPROPion 150 MG 12 hr tablet Commonly known as: ZYBAN Take 150 mg by mouth daily.   cyclobenzaprine 10 MG tablet Commonly known as: FLEXERIL Take 1 tablet (10 mg total) by mouth 3 (three) times daily as needed for muscle spasms.   docusate sodium 100 MG capsule Commonly known as: COLACE Take 1 capsule (100 mg total) by mouth 2 (two) times daily.   gabapentin 300 MG capsule Commonly known as: NEURONTIN Take 300 mg by mouth 3 (three) times daily.   GLUCOSAMINE HCL PO Take 1 tablet by mouth at bedtime.   HYDROcodone-acetaminophen 5-325 MG tablet Commonly known as: NORCO/VICODIN Take 1 tablet by mouth every 4 (four) hours as needed for moderate pain.   magnesium oxide 400 MG tablet Commonly known as: MAG-OX Take 400 mg by mouth daily.   meclizine 12.5 MG tablet Commonly known as: ANTIVERT Take 1 tablet (12.5 mg total) by mouth 2 (two) times daily as needed for dizziness.   multivitamin-iron-minerals-folic acid chewable tablet Chew 1 tablet by mouth daily.   pantoprazole 40 MG tablet Commonly known as: PROTONIX Take 40 mg by mouth daily.   potassium  chloride SA 20 MEQ tablet Commonly known as: KLOR-CON Take 20 mEq by mouth daily.   Repatha SureClick 591 MG/ML Soaj Generic drug: Evolocumab Inject 140 mg into the skin every 14 (fourteen) days.   spironolactone-hydrochlorothiazide 25-25 MG tablet Commonly known as: ALDACTAZIDE Take 1 tablet by mouth daily.       Follow-up Information     Douglas Pies, MD. Schedule an appointment as soon as possible for a visit in 2 week(s).   Specialty: Neurosurgery Contact information: [address] [place] [address] [phone]               Signed: Patricia Keller 12/28/2019, 9:58 AM

## 2019-12-28 NOTE — Plan of Care (Signed)
Patient alert and oriented, mae's well, voiding adequate amount of urine, swallowing without difficulty, no c/o pain at time of discharge. Patient discharged home with family. Script and discharged instructions given to patient. Patient and family stated understanding of instructions given. Patient has an appointment with Dr. Jenkins   

## 2020-01-17 ENCOUNTER — Ambulatory Visit (INDEPENDENT_AMBULATORY_CARE_PROVIDER_SITE_OTHER): Payer: Medicare HMO | Admitting: Family Medicine

## 2020-01-17 ENCOUNTER — Encounter: Payer: Self-pay | Admitting: Family Medicine

## 2020-01-17 ENCOUNTER — Other Ambulatory Visit: Payer: Self-pay

## 2020-01-17 VITALS — BP 138/82 | HR 71 | Temp 97.4°F | Resp 16 | Ht 66.0 in | Wt 199.4 lb

## 2020-01-17 DIAGNOSIS — R42 Dizziness and giddiness: Secondary | ICD-10-CM

## 2020-01-17 DIAGNOSIS — I10 Essential (primary) hypertension: Secondary | ICD-10-CM

## 2020-01-17 DIAGNOSIS — E785 Hyperlipidemia, unspecified: Secondary | ICD-10-CM

## 2020-01-17 DIAGNOSIS — M48062 Spinal stenosis, lumbar region with neurogenic claudication: Secondary | ICD-10-CM

## 2020-01-17 DIAGNOSIS — E1169 Type 2 diabetes mellitus with other specified complication: Secondary | ICD-10-CM | POA: Diagnosis not present

## 2020-01-17 LAB — POCT GLYCOSYLATED HEMOGLOBIN (HGB A1C)
HbA1c POC (<> result, manual entry): 6.4 % (ref 4.0–5.6)
HbA1c, POC (controlled diabetic range): 6.4 % (ref 0.0–7.0)
HbA1c, POC (prediabetic range): 6.4 % (ref 5.7–6.4)
Hemoglobin A1C: 6.4 % — AB (ref 4.0–5.6)

## 2020-01-17 NOTE — Assessment & Plan Note (Signed)
Doing really well post surgery.  He is currently not allowed to bend, lift, twist.  Has follow-up in 1 month with Dr. Arnoldo Morale.

## 2020-01-17 NOTE — Assessment & Plan Note (Addendum)
He has been extensively assessed by ENT, cardiology.  Recently went through a surgery with sedation and did okay with that as well.  Unsure of what is occurring to causing blackout spells perhaps a component that is more psychological such as anxiety. I have encouraged him to count a focus on his sensations and feelings regarding his emotions when the spells happen.  To see if that could be a possible cause. I have also encouraged him to make sure that he is drinking well.  Making sure that he is eating consistently and a well-balanced diet.  Making sure that he is resting.

## 2020-01-17 NOTE — Patient Instructions (Signed)
I appreciate the opportunity to provide you with care for your health and wellness. Today we discussed: overall health and recent surgery   Follow up: 3 months   No labs or referrals today  Happy [place] Birthday :)   I am sorry about the continued dizzy spells. Your heart looks good and you did well with a surgery, so that is promising. Sometimes anxiety can cause dizziness. Please try and see if you are upset or worried about something when these "black out spells" happen.  Hydrate well with water. Eat a balanced diet. Sleep well. Avoid excessive heat or cold temps.  Please continue to practice social distancing to keep you, your family, and our community safe.  If you must go out, please wear a mask and practice good handwashing.  It was a pleasure to see you and I look forward to continuing to work together on your health and well-being. Please do not hesitate to call the office if you need care or have questions about your care.  Have a wonderful day and week. With Gratitude, Cherly Beach, DNP, AGNP-BC

## 2020-01-17 NOTE — Assessment & Plan Note (Signed)
Douglas Keller is encouraged to maintain a well balanced diet that is low in salt. Controlled, continue current medication regimen. .  Additionally, he is also reminded that exercise is beneficial for heart health and control of  Blood pressure. 30-60 minutes daily is recommended-walking was suggested.

## 2020-01-17 NOTE — Progress Notes (Signed)
Subjective:  [name], male    DOB: 01/23/1941  Age: 79 y.o. MRN: [id number]  CC:  Chief Complaint  Patient presents with  . Follow-up    dizzy spells and blacking out for about 3-4 years ears start ringing face gets flushed and then passes out       HPI  HPI   Mr. Woodbury is a 79 year old male patient of mine.  He is a very pleasant gentleman.  He presents today for follow-up.  He continuously reports that dizzy spells and blacking out for the last 3 to 4 years.  He has been seen by ENT, heart work-up.  And recently had a procedure for surgery that he did really well with without any incident.  His back is doing much better and is healing well.  He is unable to bend, lift, twist at this time.  Is ready to go back to golfing but he was told that he had to wait another 3 months.  He denies having any chest pain, palpitations, leg swelling, cough, shortness of breath, headaches, vision changes.  Today patient denies signs and symptoms of COVID 19 infection including fever, chills, cough, shortness of breath, and headache. Past Medical, Surgical, Social History, Allergies, and Medications have been Reviewed.   Past Medical History:  Diagnosis Date  . Aortic valve sclerosis   . Atherosclerosis of coronary artery without angina pectoris 07/05/2014  . Cervical spondylosis without myelopathy 02/13/2013  . Chest pain 01/29/2019  . Degeneration of cervical intervertebral disc 02/13/2013  . Depression    sometimes  . Diabetes mellitus type 2, diet-controlled ([place])   . Elevated troponin   . GERD (gastroesophageal reflux disease)   . Heart murmur   . HTN (hypertension) 07/06/2013  . Hypercholesterolemia 08/21/2015  . Hyperlipidemia 07/06/2013  . Medication management 08/21/2015  . Pre-diabetes   . Rectal bleeding 09/21/2019  . Stiffness of left shoulder joint 07/19/2013    Current Meds  Medication Sig  . amLODipine (NORVASC) 10 MG tablet Take 10 mg by mouth daily.  Marland Kitchen  aspirin EC 81 MG tablet Take 81 mg by mouth daily.  . benazepril (LOTENSIN) 40 MG tablet Take 40 mg by mouth daily.  Marland Kitchen buPROPion (ZYBAN) 150 MG 12 hr tablet Take 150 mg by mouth daily.  . Evolocumab (REPATHA SURECLICK) 631 MG/ML SOAJ Inject 140 mg into the skin every 14 (fourteen) days.  Marland Kitchen gabapentin (NEURONTIN) 300 MG capsule Take 300 mg by mouth 3 (three) times daily.  Marland Kitchen GLUCOSAMINE HCL PO Take 1 tablet by mouth at bedtime.  . magnesium oxide (MAG-OX) 400 MG tablet Take 400 mg by mouth daily.  . meclizine (ANTIVERT) 12.5 MG tablet Take 1 tablet (12.5 mg total) by mouth 2 (two) times daily as needed for dizziness.  . multivitamin-iron-minerals-folic acid (CENTRUM) chewable tablet Chew 1 tablet by mouth daily.  . pantoprazole (PROTONIX) 40 MG tablet Take 40 mg by mouth daily.  . potassium chloride SA (KLOR-CON) 20 MEQ tablet Take 20 mEq by mouth daily.  Marland Kitchen spironolactone-hydrochlorothiazide (ALDACTAZIDE) 25-25 MG tablet Take 1 tablet by mouth daily.    ROS:  Review of Systems  Constitutional: Negative.   HENT: Negative.   Eyes: Negative.   Respiratory: Negative.   Cardiovascular: Negative.   Gastrointestinal: Negative.   Genitourinary: Negative.   Musculoskeletal: Negative.   Skin: Negative.   Neurological: Negative.   Endo/Heme/Allergies: Negative.   Psychiatric/Behavioral: Negative.   All other systems reviewed and are negative.  Objective:   Today's Vitals: BP 138/82 (BP Location: Left Arm, Patient Position: Sitting, Cuff Size: Normal)   Pulse 71   Temp (!) 97.4 F (36.3 C) (Temporal)   Resp 16   Ht 5\' 6"  (1.676 m)   Wt 199 lb 6.4 oz (90.4 kg)   SpO2 97%   BMI 32.18 kg/m  Vitals with BMI 01/17/2020 12/28/2019 12/28/2019  Height 5\' 6"  - -  Weight 199 lbs 6 oz - -  BMI 70.6 - -  Systolic 237 628 315  Diastolic 82 83 83  Pulse 71 60 64     Physical Exam Vitals and nursing note reviewed.  Constitutional:      Appearance: Normal appearance. He is obese.  HENT:      Head: Normocephalic and atraumatic.     Right Ear: External ear normal.     Left Ear: External ear normal.     Mouth/Throat:     Comments: Mask in place Eyes:     General:        Right eye: No discharge.        Left eye: No discharge.     Conjunctiva/sclera: Conjunctivae normal.  Cardiovascular:     Rate and Rhythm: Normal rate and regular rhythm.     Pulses: Normal pulses.     Heart sounds: Normal heart sounds.  Pulmonary:     Effort: Pulmonary effort is normal.     Breath sounds: Normal breath sounds.  Musculoskeletal:     Cervical back: Normal range of motion and neck supple.  Skin:    General: Skin is warm.  Neurological:     General: No focal deficit present.     Mental Status: He is alert and oriented to person, place, and time.  Psychiatric:        Mood and Affect: Mood normal.        Behavior: Behavior normal.        Thought Content: Thought content normal.        Judgment: Judgment normal.     Depression screen [facility] 2/9 01/17/2020 11/29/2019 09/05/2019 08/22/2019 08/01/2019  Decreased Interest 0 0 0 0 0  Down, Depressed, Hopeless 0 0 0 0 0  PHQ - 2 Score 0 0 0 0 0  Altered sleeping 0 3 - - -  Tired, decreased energy 0 3 - - -  Change in appetite 0 1 - - -  Feeling bad or failure about yourself  0 0 - - -  Trouble concentrating 0 0 - - -  Moving slowly or fidgety/restless 0 0 - - -  Suicidal thoughts 0 0 - - -  PHQ-9 Score 0 7 - - -  Difficult doing work/chores Not difficult at all Somewhat difficult - - -       Assessment   1. Type 2 diabetes mellitus with hyperlipidemia ([place])   2. Essential hypertension   3. Spinal stenosis of lumbar region with neurogenic claudication   4. Dizzy spells     Tests ordered Orders Placed This Encounter  Procedures  . POCT glycosylated hemoglobin (Hb A1C)     Plan: Please see assessment and plan per problem list above.   No orders of the defined types were placed in this encounter.   Patient to follow-up in 3  months.  Perlie Mayo, NP

## 2020-02-26 ENCOUNTER — Telehealth: Payer: Medicare HMO

## 2020-02-28 ENCOUNTER — Telehealth (INDEPENDENT_AMBULATORY_CARE_PROVIDER_SITE_OTHER): Payer: Medicare HMO

## 2020-02-28 ENCOUNTER — Other Ambulatory Visit: Payer: Self-pay

## 2020-02-28 VITALS — BP 138/82 | Ht 66.0 in | Wt 199.0 lb

## 2020-02-28 DIAGNOSIS — Z0000 Encounter for general adult medical examination without abnormal findings: Secondary | ICD-10-CM | POA: Diagnosis not present

## 2020-02-28 DIAGNOSIS — Z1159 Encounter for screening for other viral diseases: Secondary | ICD-10-CM | POA: Diagnosis not present

## 2020-02-28 NOTE — Patient Instructions (Signed)
Douglas Keller , Thank you for taking time to come for your Medicare Wellness Visit. I appreciate your ongoing commitment to your health goals. Please review the following plan we discussed and let me know if I can assist you in the future.   Screening recommendations/referrals: Colonoscopy: up to date Recommended yearly ophthalmology/optometry visit for glaucoma screening and checkup Recommended yearly dental visit for hygiene and checkup  Vaccinations: Influenza vaccine: Due fall of 2021 Pneumococcal vaccine: up to date Tdap vaccine: not covered as a preventative service Shingles vaccine: Check with [facility] about cost of vaccine       Next appointment: wellness in 1 year  Preventive Care 61 Years and Older, Male Preventive care refers to lifestyle choices and visits with your health care provider that can promote health and wellness. What does preventive care include?  A yearly physical exam. This is also called an annual well check.  Dental exams once or twice a year.  Routine eye exams. Ask your health care provider how often you should have your eyes checked.  Personal lifestyle choices, including:  Daily care of your teeth and gums.  Regular physical activity.  Eating a healthy diet.  Avoiding tobacco and drug use.  Limiting alcohol use.  Practicing safe sex.  Taking low doses of aspirin every day.  Taking vitamin and mineral supplements as recommended by your health care provider. What happens during an annual well check? The services and screenings done by your health care provider during your annual well check will depend on your age, overall health, lifestyle risk factors, and family history of disease. Counseling  Your health care provider may ask you questions about your:  Alcohol use.  Tobacco use.  Drug use.  Emotional well-being.  Home and relationship well-being.  Sexual activity.  Eating habits.  History of falls.  Memory and ability to  understand (cognition).  Work and work Statistician. Screening  You may have the following tests or measurements:  Height, weight, and BMI.  Blood pressure.  Lipid and cholesterol levels. These may be checked every 5 years, or more frequently if you are over 14 years old.  Skin check.  Lung cancer screening. You may have this screening every year starting at age 85 if you have a 30-pack-year history of smoking and currently smoke or have quit within the past 15 years.  Fecal occult blood test (FOBT) of the stool. You may have this test every year starting at age 110.  Flexible sigmoidoscopy or colonoscopy. You may have a sigmoidoscopy every 5 years or a colonoscopy every 10 years starting at age 76.  Prostate cancer screening. Recommendations will vary depending on your family history and other risks.  Hepatitis C blood test.  Hepatitis B blood test.  Sexually transmitted disease (STD) testing.  Diabetes screening. This is done by checking your blood sugar (glucose) after you have not eaten for a while (fasting). You may have this done every 1-3 years.  Abdominal aortic aneurysm (AAA) screening. You may need this if you are a current or former smoker.  Osteoporosis. You may be screened starting at age 27 if you are at high risk. Talk with your health care provider about your test results, treatment options, and if necessary, the need for more tests. Vaccines  Your health care provider may recommend certain vaccines, such as:  Influenza vaccine. This is recommended every year.  Tetanus, diphtheria, and acellular pertussis (Tdap, Td) vaccine. You may need a Td booster every 10 years.  Zoster  vaccine. You may need this after age 20.  Pneumococcal 13-valent conjugate (PCV13) vaccine. One dose is recommended after age 41.  Pneumococcal polysaccharide (PPSV23) vaccine. One dose is recommended after age 26. Talk to your health care provider about which screenings and vaccines you  need and how often you need them. This information is not intended to replace advice given to you by your health care provider. Make sure you discuss any questions you have with your health care provider. Document Released: 08/02/2015 Document Revised: 03/25/2016 Document Reviewed: 05/07/2015 Elsevier Interactive Patient Education  2017 [place] Prevention in the Home Falls can cause injuries. They can happen to people of all ages. There are many things you can do to make your home safe and to help prevent falls. What can I do on the outside of my home?  Regularly fix the edges of walkways and driveways and fix any cracks.  Remove anything that might make you trip as you walk through a door, such as a raised step or threshold.  Trim any bushes or trees on the path to your home.  Use bright outdoor lighting.  Clear any walking paths of anything that might make someone trip, such as rocks or tools.  Regularly check to see if handrails are loose or broken. Make sure that both sides of any steps have handrails.  Any raised decks and porches should have guardrails on the edges.  Have any leaves, snow, or ice cleared regularly.  Use sand or salt on walking paths during winter.  Clean up any spills in your garage right away. This includes oil or grease spills. What can I do in the bathroom?  Use night lights.  Install grab bars by the toilet and in the tub and shower. Do not use towel bars as grab bars.  Use non-skid mats or decals in the tub or shower.  If you need to sit down in the shower, use a plastic, non-slip stool.  Keep the floor dry. Clean up any water that spills on the floor as soon as it happens.  Remove soap buildup in the tub or shower regularly.  Attach bath mats securely with double-sided non-slip rug tape.  Do not have throw rugs and other things on the floor that can make you trip. What can I do in the bedroom?  Use night lights.  Make sure  that you have a light by your bed that is easy to reach.  Do not use any sheets or blankets that are too big for your bed. They should not hang down onto the floor.  Have a firm chair that has side arms. You can use this for support while you get dressed.  Do not have throw rugs and other things on the floor that can make you trip. What can I do in the kitchen?  Clean up any spills right away.  Avoid walking on wet floors.  Keep items that you use a lot in easy-to-reach places.  If you need to reach something above you, use a strong step stool that has a grab bar.  Keep electrical cords out of the way.  Do not use floor polish or wax that makes floors slippery. If you must use wax, use non-skid floor wax.  Do not have throw rugs and other things on the floor that can make you trip. What can I do with my stairs?  Do not leave any items on the stairs.  Make sure that there are handrails  on both sides of the stairs and use them. Fix handrails that are broken or loose. Make sure that handrails are as long as the stairways.  Check any carpeting to make sure that it is firmly attached to the stairs. Fix any carpet that is loose or worn.  Avoid having throw rugs at the top or bottom of the stairs. If you do have throw rugs, attach them to the floor with carpet tape.  Make sure that you have a light switch at the top of the stairs and the bottom of the stairs. If you do not have them, ask someone to add them for you. What else can I do to help prevent falls?  Wear shoes that:  Do not have high heels.  Have rubber bottoms.  Are comfortable and fit you well.  Are closed at the toe. Do not wear sandals.  If you use a stepladder:  Make sure that it is fully opened. Do not climb a closed stepladder.  Make sure that both sides of the stepladder are locked into place.  Ask someone to hold it for you, if possible.  Clearly mark and make sure that you can see:  Any grab bars or  handrails.  First and last steps.  Where the edge of each step is.  Use tools that help you move around (mobility aids) if they are needed. These include:  Canes.  Walkers.  Scooters.  Crutches.  Turn on the lights when you go into a dark area. Replace any light bulbs as soon as they burn out.  Set up your furniture so you have a clear path. Avoid moving your furniture around.  If any of your floors are uneven, fix them.  If there are any pets around you, be aware of where they are.  Review your medicines with your doctor. Some medicines can make you feel dizzy. This can increase your chance of falling. Ask your doctor what other things that you can do to help prevent falls. This information is not intended to replace advice given to you by your health care provider. Make sure you discuss any questions you have with your health care provider. Document Released: 05/02/2009 Document Revised: 12/12/2015 Document Reviewed: 08/10/2014 Elsevier Interactive Patient Education  2017 [employer].

## 2020-02-28 NOTE — Progress Notes (Signed)
Subjective:   Terrill Alperin is a 79 y.o. male who presents for Medicare Annual/Subsequent preventive examination.  Review of Systems     Cardiac Risk Factors include: advanced age (>43men, >92 women);diabetes mellitus;dyslipidemia;hypertension;male gender;obesity (BMI >30kg/m2);sedentary lifestyle     Objective:    Today's Vitals   02/28/20 1623 02/28/20 1626  BP: 138/82   Weight: 199 lb (90.3 kg)   Height: 5\' 6"  (1.676 m)   PainSc:  0-No pain   Body mass index is 32.12 kg/m.  Advanced Directives 02/28/2020 12/27/2019 12/12/2019 08/16/2019 08/15/2019 07/22/2019 04/26/2019  Does Patient Have a Medical Advance Directive? No No No Yes Yes Yes No  Type of Advance Directive - - - Healthcare Power of [place] -  Does patient want to make changes to medical advance directive? - - - - - No - Patient declined -  Would patient like information on creating a medical advance directive? Yes (ED - Information included in AVS) - No - Patient declined - - No - Patient declined No - Patient declined    Current Medications (verified) Outpatient Encounter Medications as of 02/28/2020  Medication Sig   acetaminophen (TYLENOL) 650 MG CR tablet Take 1,300 mg by mouth every 8 (eight) hours as needed for pain.    amLODipine (NORVASC) 10 MG tablet Take 10 mg by mouth daily.   aspirin EC 81 MG tablet Take 81 mg by mouth daily.   benazepril (LOTENSIN) 40 MG tablet Take 40 mg by mouth daily.   buPROPion (ZYBAN) 150 MG 12 hr tablet Take 150 mg by mouth daily.   cyclobenzaprine (FLEXERIL) 10 MG tablet Take 1 tablet (10 mg total) by mouth 3 (three) times daily as needed for muscle spasms.   Evolocumab (REPATHA SURECLICK) 983 MG/ML SOAJ Inject 140 mg into the skin every 14 (fourteen) days.   gabapentin (NEURONTIN) 300 MG capsule Take 300 mg by mouth 3 (three) times daily.   GLUCOSAMINE HCL PO Take 1 tablet by mouth at bedtime.   magnesium oxide  (MAG-OX) 400 MG tablet Take 400 mg by mouth daily.   meclizine (ANTIVERT) 12.5 MG tablet Take 1 tablet (12.5 mg total) by mouth 2 (two) times daily as needed for dizziness.   multivitamin-iron-minerals-folic acid (CENTRUM) chewable tablet Chew 1 tablet by mouth daily.   pantoprazole (PROTONIX) 40 MG tablet Take 40 mg by mouth daily.   potassium chloride SA (KLOR-CON) 20 MEQ tablet Take 20 mEq by mouth daily.   spironolactone-hydrochlorothiazide (ALDACTAZIDE) 25-25 MG tablet Take 1 tablet by mouth daily.   No facility-administered encounter medications on file as of 02/28/2020.    Allergies (verified) Statins   History: Past Medical History:  Diagnosis Date   Aortic valve sclerosis    Atherosclerosis of coronary artery without angina pectoris 07/05/2014   Cervical spondylosis without myelopathy 02/13/2013   Chest pain 01/29/2019   Degeneration of cervical intervertebral disc 02/13/2013   Depression    sometimes   Diabetes mellitus type 2, diet-controlled (HCC)    Elevated troponin    GERD (gastroesophageal reflux disease)    Heart murmur    HTN (hypertension) 07/06/2013   Hypercholesterolemia 08/21/2015   Hyperlipidemia 07/06/2013   Medication management 08/21/2015   Pre-diabetes    Rectal bleeding 09/21/2019   Stiffness of left shoulder joint 07/19/2013   Past Surgical History:  Procedure Laterality Date   BIOPSY  12/14/2019   Procedure: BIOPSY;  Surgeon: Daneil Dolin, MD;  [facility];  Service: Endoscopy;;   CERVICAL SPINE SURGERY     COLONOSCOPY  07/26/2012   Procedure: COLONOSCOPY;  Surgeon: Jamesetta So, MD;  [facility];  Service: Gastroenterology;  Laterality: N/A;   COLONOSCOPY WITH PROPOFOL N/A 12/14/2019   Procedure: COLONOSCOPY WITH PROPOFOL;  Surgeon: Daneil Dolin, MD;  Location: [facility];  Service: Endoscopy;  Laterality: N/A;  9:00am   KNEE ARTHROSCOPY Left    torn ligament   LUMBAR  LAMINECTOMY/DECOMPRESSION MICRODISCECTOMY Bilateral 12/27/2019   Procedure: BIATERAL LUMBAR TWO- LUMBAR THREE, LUMBAR THREE- LUMBAR FOUR, LUMBAR FOUR-FIVE LAMINOTOMY/FORAMINOTOMY 3 LEVELS;  Surgeon: Newman Pies, MD;  Location: [place];  Service: Neurosurgery;  Laterality: Bilateral;  POSTERIOR   SPINE SURGERY N/A    Phreesia 02/24/2020   Family History  Problem Relation Age of Onset   Asthma Mother    Hypertension Father    Colon cancer Neg Hx    Social History   Socioeconomic History   Marital status: Widowed    Spouse name: Cherly    Number of children: 1   Years of education: Not on file   Highest education level: 12th grade  Occupational History    Comment: Retired   Tobacco Use   Smoking status: Never Smoker   Smokeless tobacco: Never Used  Scientific laboratory technician Use: Never used  Substance and Sexual Activity   Alcohol use: No   Drug use: No   Sexual activity: Not Currently  Other Topics Concern   Not on file  Social History Narrative   Live alone right now, wife passed in 2009    [place] 2 live in [place]      Enjoys playing golf      Diet: eats what he likes, sweet, fast food, does not eat a lot of canned or boxed foods.    Does not eat enough fruits and veggies.       Caffeine: 1 cup daily   Water: 3-4 bottles of water daily, Gatorade every now and then         Wears seat belt   Wears sun protection when golfing   Smoke detectors at home   Does not use phone while driving   Social Determinants of Health   Financial Resource Strain: Low Risk    Difficulty of Paying Living Expenses: Not hard at all  Food Insecurity: No Food Insecurity   Worried About Charity fundraiser in the Last Year: Never true   Arboriculturist in the Last Year: Never true  Transportation Needs: No Transportation Needs   Lack of Transportation (Medical): No   Lack of Transportation (Non-Medical): No  Physical Activity: Insufficiently Active     Days of Exercise per Week: 2 days   Minutes of Exercise per Session: 10 min  Stress: No Stress Concern Present   Feeling of Stress : Not at all  Social Connections: Moderately Isolated   Frequency of Communication with Friends and Family: More than three times a week   Frequency of Social Gatherings with Friends and Family: More than three times a week   Attends Religious Services: More than 4 times per year   Active Member of [employer] or Organizations: No   Attends Archivist Meetings: Never   Marital Status: Widowed    Tobacco Counseling Counseling given: Not Answered   Clinical Intake:  Pre-visit preparation completed: Yes  Pain : No/denies pain Pain Score: 0-No pain     Nutritional Status: BMI >  30  Obese Diabetes: Yes CBG done?: No Did pt. bring in CBG monitor from home?: No  How often do you need to have someone help you when you read instructions, pamphlets, or other written materials from your doctor or pharmacy?: 1 - Never What is the last grade level you completed in school?: 12th grade  Diabetic? yes     Information entered by :: Lavaya Defreitas LPN   Activities of Daily Living In your present state of health, do you have any difficulty performing the following activities: 02/28/2020 12/27/2019  Hearing? N -  Vision? N -  Difficulty concentrating or making decisions? N -  Walking or climbing stairs? Y -  Dressing or bathing? N -  Doing errands, shopping? N N  Preparing Food and eating ? N -  Using the Toilet? N -  In the past six months, have you accidently leaked urine? N -  Do you have problems with loss of bowel control? N -  Managing your Medications? N -  Managing your Finances? N -  Housekeeping or managing your Housekeeping? N -  Some recent data might be hidden    Patient Care Team: Perlie Mayo, NP as PCP - General (Family Medicine) Croitoru, Dani Gobble, MD as PCP - Cardiology (Cardiology) Gala Romney [name], MD as Consulting  Physician (Gastroenterology)  Indicate any recent Medical Services you may have received from other than [facility] providers in the past year (date may be approximate).     Assessment:   This is a routine wellness examination for [place].  Hearing/Vision screen No exam data present  Dietary issues and exercise activities discussed: Current Exercise Habits: The patient does not participate in regular exercise at present, Exercise limited by: orthopedic condition(s) (recently had procedure on his back and will start back walking 3-4 times a week)  Goals     DIET - INCREASE WATER INTAKE     Exercise 3x per week (30 min per time)     Walk for 20 mins 5 days a week      LIFESTYLE - DECREASE FALLS RISK      Depression Screen PHQ 2/9 Scores 02/28/2020 01/17/2020 11/29/2019 09/05/2019 08/22/2019 08/01/2019 07/07/2019  PHQ - 2 Score 0 0 0 0 0 0 3  PHQ- 9 Score 0 0 7 - - - 10    Fall Risk Fall Risk  02/28/2020 02/28/2020 01/17/2020 11/29/2019 09/05/2019  Falls in the past year? 0 0 0 0 0  Number falls in past yr: 0 0 0 0 0  Injury with Fall? 0 0 0 0 0  Risk for fall due to : - - No Fall Risks - -  Follow up - - Falls evaluation completed - -    Any stairs in or around the home? No  If so, are there any without handrails? No  Home free of loose throw rugs in walkways, pet beds, electrical cords, etc? Yes  Adequate lighting in your home to reduce risk of falls? Yes   ASSISTIVE DEVICES UTILIZED TO PREVENT FALLS:  Life alert? No  Use of a cane, walker or w/c? No  Grab bars in the bathroom? Yes  Shower chair or bench in shower? No  Elevated toilet seat or a handicapped toilet? No   TIMED UP AND GO:  Was the test performed? No .  Length of time to ambulate 10 feet:  sec.     Cognitive Function:     6CIT Screen 02/28/2020 02/28/2020  What Year? 0 points  0 points  What month? 0 points 0 points  What time? 0 points -  Count back from 20 0 points -  Months in reverse 0 points -  Repeat  phrase 0 points -  Total Score 0 -    Immunizations Immunization History  Administered Date(s) Administered   Fluad Quad(high Dose 65+) 03/20/2019   Influenza-Unspecified 04/19/2014   Moderna SARS-COVID-2 Vaccination 08/11/2019, 09/11/2019   Pneumococcal Polysaccharide-23 11/29/2019   Zoster Recombinat (Shingrix) 02/03/2019    TDAP status: Due, Education has been provided regarding the importance of this vaccine. Advised may receive this vaccine at local pharmacy or Health Dept. Aware to provide a copy of the vaccination record if obtained from local pharmacy or Health Dept. Verbalized acceptance and understanding. Flu Vaccine status: Up to date Pneumococcal vaccine status: Up to date Covid-19 vaccine status: Completed vaccines  Qualifies for Shingles Vaccine? Yes   Zostavax completed No   Shingrix Completed?: No.    Education has been provided regarding the importance of this vaccine. Patient has been advised to call insurance company to determine out of pocket expense if they have not yet received this vaccine. Advised may also receive vaccine at local pharmacy or Health Dept. Verbalized acceptance and understanding.  Screening Tests Health Maintenance  Topic Date Due   Hepatitis C Screening  Never done   INFLUENZA VACCINE  02/18/2020   TETANUS/TDAP  03/16/2020 (Originally 01/07/1960)   OPHTHALMOLOGY EXAM  03/05/2020   FOOT EXAM  05/10/2020   HEMOGLOBIN A1C  07/18/2020   PNA vac Low Risk Adult (2 of 2 - PCV13) 11/28/2020   COVID-19 Vaccine  Completed    Health Maintenance  Health Maintenance Due  Topic Date Due   Hepatitis C Screening  Never done   INFLUENZA VACCINE  02/18/2020    Colorectal cancer screening: Completed 2021. Repeat every 10 years  Lung Cancer Screening: (Low Dose CT Chest recommended if Age 21-80 years, 30 pack-year currently smoking OR have quit w/in 15years.) does not qualify.   Lung Cancer Screening Referral: no  Additional  Screening:  Hepatitis C Screening: does qualify; ordered to have done  Vision Screening: Recommended annual ophthalmology exams for early detection of glaucoma and other disorders of the eye. Is the patient up to date with their annual eye exam?  Yes  Who is the provider or what is the name of the office in which the patient attends annual eye exams? Dr Jorja Loa- my eye dr  If pt is not established with a provider, would they like to be referred to a provider to establish care? No .   Dental Screening: Recommended annual dental exams for proper oral hygiene  Community Resource Referral / Chronic Care Management: CRR required this visit?  No   CCM required this visit?  No      Plan:     I have personally reviewed and noted the following in the patients chart:    Medical and social history  Use of alcohol, tobacco or illicit drugs   Current medications and supplements  Functional ability and status  Nutritional status  Physical activity  Advanced directives  List of other physicians  Hospitalizations, surgeries, and ER visits in previous 12 months  Vitals  Screenings to include cognitive, depression, and falls  Referrals and appointments  In addition, I have reviewed and discussed with patient certain preventive protocols, quality metrics, and best practice recommendations. A written personalized care plan for preventive services as well as general preventive health recommendations were provided  to patient.     Kate Sable, LPN, LPN   10/21/3531   Nurse Notes: Visit performed by telephone. Patient was at home and provider was in the office. Time spent with patient 30 mins

## 2020-02-29 ENCOUNTER — Ambulatory Visit: Payer: Medicare HMO | Admitting: Family Medicine

## 2020-03-08 ENCOUNTER — Other Ambulatory Visit: Payer: Self-pay

## 2020-03-08 ENCOUNTER — Encounter: Payer: Self-pay | Admitting: Family Medicine

## 2020-03-08 ENCOUNTER — Ambulatory Visit (INDEPENDENT_AMBULATORY_CARE_PROVIDER_SITE_OTHER): Payer: Medicare HMO | Admitting: Family Medicine

## 2020-03-08 VITALS — BP 147/83 | HR 80 | Resp 16 | Ht 66.0 in | Wt 206.1 lb

## 2020-03-08 DIAGNOSIS — R42 Dizziness and giddiness: Secondary | ICD-10-CM

## 2020-03-08 DIAGNOSIS — I10 Essential (primary) hypertension: Secondary | ICD-10-CM

## 2020-03-08 DIAGNOSIS — M542 Cervicalgia: Secondary | ICD-10-CM

## 2020-03-08 DIAGNOSIS — F321 Major depressive disorder, single episode, moderate: Secondary | ICD-10-CM | POA: Diagnosis not present

## 2020-03-08 DIAGNOSIS — M4802 Spinal stenosis, cervical region: Secondary | ICD-10-CM

## 2020-03-08 DIAGNOSIS — M25519 Pain in unspecified shoulder: Secondary | ICD-10-CM

## 2020-03-08 MED ORDER — BUPROPION HCL ER (XL) 150 MG PO TB24: 150.0000 mg | ORAL_TABLET | Freq: Every day | ORAL | 2 refills | Status: DC

## 2020-03-08 MED ORDER — BUSPIRONE HCL 5 MG PO TABS: 5.0000 mg | ORAL_TABLET | Freq: Two times a day (BID) | ORAL | 2 refills | Status: DC

## 2020-03-08 NOTE — Assessment & Plan Note (Signed)
Douglas Keller is encouraged to maintain a well balanced diet that is low in salt. Elevated today-reports related to pain. No change in medication today.    Additionally, he is also reminded that exercise is beneficial for heart health and control of  Blood pressure. 30-60 minutes daily is recommended-walking was suggested.

## 2020-03-08 NOTE — Patient Instructions (Signed)
I appreciate the opportunity to provide you with care for your health and wellness. Today we discussed: dizziness and arm pain   Follow up: 04/18/2020 as scheduled  No labs or referrals today Orders: MRI of cervical spine, previous c-spine sx  Med changes today. Please see med list for these.  Please continue to practice social distancing to keep you, your family, and our community safe.  If you must go out, please wear a mask and practice good handwashing.  It was a pleasure to see you and I look forward to continuing to work together on your health and well-being. Please do not hesitate to call the office if you need care or have questions about your care.  Have a wonderful day and week. With Gratitude, Cherly Beach, DNP, AGNP-BC

## 2020-03-08 NOTE — Progress Notes (Signed)
Subjective:  [name], male    DOB: September 01, 1940  Age: 79 y.o. MRN: [id number]  CC:  Chief Complaint  Patient presents with  . Dizziness      HPI  HPI  Douglas Keller is a 79 year old male patient who presents today with ongoing dizziness.  He has a history that includes but is not limited to atherosclerosis of coronary arteries, hypertension, GERD, type 2 diabetes with hyperlipidemia, depression, obesity, spinal stenosis of lumbar and cervical regions.  Multiple dizzy spells over the course of several years.  He reports that he just still is not feeling good and it continues to get worse.  He has had a cervical spine procedure.  He reports he has discomfort going down his right shoulder now.  It comes and goes he talked to his surgeon who did his lumbar back surgery recently and they said possible need for MRI.  He presents today to follow-up on this and see if this is able to be done because he continues to feel bad and have blackout-like spells.  He reports that his dizziness is worse when he experiences the shoulder pain or looking to the right.  He describes the pain in his shoulder and down his arm as grinding and neuropathic sounding.  He denies having any increased depression or anxiety however he suffers from both.  He reports just feeling tired and down all the time.  He reports taking his medications as directed and without any issue.  Of note he is on both Neurontin and several blood pressure medications with still elevation of blood pressure which he thinks is related to his pain.  He denies having any recent falls or injury.  He denies having any chest pain.  Denies having any dizziness today in the office.  Denies having any headaches or vision changes.  Today patient denies signs and symptoms of COVID 19 infection including fever, chills, cough, shortness of breath, and headache. Past Medical, Surgical, Social History, Allergies, and Medications have been  Reviewed.   Past Medical History:  Diagnosis Date  . Aortic valve sclerosis   . Atherosclerosis of coronary artery without angina pectoris 07/05/2014  . Cervical spondylosis without myelopathy 02/13/2013  . Chest pain 01/29/2019  . Degeneration of cervical intervertebral disc 02/13/2013  . Depression    sometimes  . Diabetes mellitus type 2, diet-controlled ([place])   . Elevated troponin   . GERD (gastroesophageal reflux disease)   . Heart murmur   . HTN (hypertension) 07/06/2013  . Hypercholesterolemia 08/21/2015  . Hyperlipidemia 07/06/2013  . Medication management 08/21/2015  . Pre-diabetes   . Rectal bleeding 09/21/2019  . Stiffness of left shoulder joint 07/19/2013    Current Meds  Medication Sig  . acetaminophen (TYLENOL) 650 MG CR tablet Take 1,300 mg by mouth every 8 (eight) hours as needed for pain.   Marland Kitchen amLODipine (NORVASC) 10 MG tablet Take 10 mg by mouth daily.  Marland Kitchen aspirin EC 81 MG tablet Take 81 mg by mouth daily.  . benazepril (LOTENSIN) 40 MG tablet Take 40 mg by mouth daily.  . Evolocumab (REPATHA SURECLICK) 297 MG/ML SOAJ Inject 140 mg into the skin every 14 (fourteen) days.  Marland Kitchen gabapentin (NEURONTIN) 300 MG capsule Take 300 mg by mouth 3 (three) times daily.  Marland Kitchen GLUCOSAMINE HCL PO Take 1 tablet by mouth at bedtime.  . magnesium oxide (MAG-OX) 400 MG tablet Take 400 mg by mouth daily.  . meclizine (ANTIVERT) 12.5 MG tablet Take 1  tablet (12.5 mg total) by mouth 2 (two) times daily as needed for dizziness.  . multivitamin-iron-minerals-folic acid (CENTRUM) chewable tablet Chew 1 tablet by mouth daily.  . pantoprazole (PROTONIX) 40 MG tablet Take 40 mg by mouth daily.  . potassium chloride SA (KLOR-CON) 20 MEQ tablet Take 20 mEq by mouth daily.  Marland Kitchen spironolactone-hydrochlorothiazide (ALDACTAZIDE) 25-25 MG tablet Take 1 tablet by mouth daily.  . [DISCONTINUED] buPROPion (ZYBAN) 150 MG 12 hr tablet Take 150 mg by mouth daily.  . [DISCONTINUED] cyclobenzaprine (FLEXERIL) 10 MG  tablet Take 1 tablet (10 mg total) by mouth 3 (three) times daily as needed for muscle spasms.    ROS:  Review of Systems  Constitutional: Negative.   HENT: Negative.   Eyes: Negative.   Respiratory: Negative.   Cardiovascular: Negative.   Gastrointestinal: Negative.   Genitourinary: Negative.   Musculoskeletal: Positive for joint pain and neck pain.  Skin: Negative.   Neurological: Positive for dizziness.  Endo/Heme/Allergies: Negative.   Psychiatric/Behavioral: Negative.   All other systems reviewed and are negative.    Objective:   Today's Vitals: BP (!) 147/83   Pulse 80   Resp 16   Ht 5\' 6"  (1.676 m)   Wt 206 lb 1.3 oz (93.5 kg)   SpO2 97%   BMI 33.26 kg/m  Vitals with BMI 03/08/2020 02/28/2020 01/17/2020  Height 5\' 6"  5\' 6"  5\' 6"   Weight 206 lbs 1 oz 199 lbs 199 lbs 6 oz  BMI 33.28 58.52 77.8  Systolic 242 353 614  Diastolic 83 82 82  Pulse 80 - 71     Physical Exam Vitals and nursing note reviewed.  Constitutional:      Appearance: Normal appearance. He is well-developed and well-groomed. He is obese.  HENT:     Head: Normocephalic and atraumatic.     Right Ear: External ear normal.     Left Ear: External ear normal.     Mouth/Throat:     Comments: Mask in place  Eyes:     General:        Right eye: No discharge.        Left eye: No discharge.     Conjunctiva/sclera: Conjunctivae normal.  Cardiovascular:     Rate and Rhythm: Normal rate and regular rhythm.     Pulses: Normal pulses.     Heart sounds: Normal heart sounds.  Pulmonary:     Effort: Pulmonary effort is normal.     Breath sounds: Normal breath sounds.  Musculoskeletal:        General: Normal range of motion.     Right shoulder: Normal.     Left shoulder: Normal.     Cervical back: Normal range of motion and neck supple.     Comments: Moves all extremities, some limited range of motion.  Skin:    General: Skin is warm.  Neurological:     General: No focal deficit present.      Mental Status: He is alert and oriented to person, place, and time.  Psychiatric:        Attention and Perception: Attention and perception normal.        Mood and Affect: Mood is depressed. Affect is flat.        Speech: Speech normal.        Behavior: Behavior normal. Behavior is cooperative.        Thought Content: Thought content normal.        Cognition and Memory: Cognition and memory normal.  Judgment: Judgment normal.     Comments: Pleasant in conversation, good eye contact      Assessment   1. Depression, major, single episode, moderate ([place])   2. Spinal stenosis in cervical region   3. Dizzy spells   4. Neck and shoulder pain   5. Essential hypertension     Tests ordered Orders Placed This Encounter  Procedures  . Douglas Cervical Spine Wo Contrast     Plan: Please see assessment and plan per problem list above.   Meds ordered this encounter  Medications  . buPROPion (WELLBUTRIN XL) 150 MG 24 hr tablet    Sig: Take 1 tablet (150 mg total) by mouth daily.    Dispense:  30 tablet    Refill:  2    Order Specific Question:   Supervising Provider    Answer:   Douglas Keller, Douglas Keller [[id number]]  . busPIRone (BUSPAR) 5 MG tablet    Sig: Take 1 tablet (5 mg total) by mouth 2 (two) times daily.    Dispense:  60 tablet    Refill:  2    Order Specific Question:   Supervising Provider    Answer:   Douglas Keller    Patient to follow-up in 04/18/2020   Perlie Mayo, NP

## 2020-03-08 NOTE — Assessment & Plan Note (Signed)
He has been extensively worked up from ENT to cardiology.  Recent surgery with sedation did okay did not have any problems with this unsure what is causing blackouts and ongoing dizziness and sensation/radiculopathy now into her shoulder.  Will be getting updated MRI.  Advised for him to follow-up with orthopedist as well.  Might need injections into the joint he is on high-dose Neurontin at this time.  Given the dizziness I do not want to adjust this medication right now.

## 2020-03-08 NOTE — Assessment & Plan Note (Addendum)
I do worry about his depression being a factor in this.  I will put him on Wellbutrin in addition to BuSpar to see if that is more helpful for controlling his depression and anxiety.  Close follow-up within a month.  He denies having any SI or HI.

## 2020-03-08 NOTE — Assessment & Plan Note (Signed)
Referral to orthopedics was performed back in February.  He had injections done to his neck and shoulder area and that was doing well.  He still demonstrates good range of motion with that right shoulder in the physical exam today.  He does have some limited movement with turning his neck has had a cervical spine surgery in the past.  Given his pain and disruption of sensation and dizziness would like to get an MRI of his neck to make sure hardware is in place and there is been no changes or compression.

## 2020-03-21 ENCOUNTER — Other Ambulatory Visit: Payer: Self-pay | Admitting: *Deleted

## 2020-03-21 DIAGNOSIS — F321 Major depressive disorder, single episode, moderate: Secondary | ICD-10-CM

## 2020-03-21 MED ORDER — BUPROPION HCL ER (XL) 150 MG PO TB24: 150.0000 mg | ORAL_TABLET | Freq: Every day | ORAL | 0 refills | Status: DC

## 2020-03-21 MED ORDER — POTASSIUM CHLORIDE CRYS ER 20 MEQ PO TBCR: 20.0000 meq | EXTENDED_RELEASE_TABLET | Freq: Every day | ORAL | 0 refills | Status: DC

## 2020-03-28 ENCOUNTER — Other Ambulatory Visit: Payer: Self-pay

## 2020-03-28 ENCOUNTER — Other Ambulatory Visit: Payer: Self-pay | Admitting: *Deleted

## 2020-03-28 ENCOUNTER — Ambulatory Visit (HOSPITAL_COMMUNITY)
Admission: RE | Admit: 2020-03-28 | Discharge: 2020-03-28 | Disposition: A | Discharge status: Home / Self Care | Payer: Medicare HMO | Source: Ambulatory Visit | Attending: Family Medicine | Admitting: Family Medicine

## 2020-03-28 DIAGNOSIS — M4802 Spinal stenosis, cervical region: Secondary | ICD-10-CM | POA: Insufficient documentation

## 2020-03-28 DIAGNOSIS — M4722 Other spondylosis with radiculopathy, cervical region: Secondary | ICD-10-CM | POA: Diagnosis not present

## 2020-03-29 ENCOUNTER — Other Ambulatory Visit: Payer: Self-pay | Admitting: Cardiovascular Disease

## 2020-04-18 ENCOUNTER — Telehealth (INDEPENDENT_AMBULATORY_CARE_PROVIDER_SITE_OTHER): Payer: Medicare HMO | Admitting: Family Medicine

## 2020-04-18 ENCOUNTER — Other Ambulatory Visit: Payer: Self-pay

## 2020-04-18 ENCOUNTER — Encounter: Payer: Self-pay | Admitting: Family Medicine

## 2020-04-18 VITALS — BP 143/83 | Ht 66.0 in | Wt 206.0 lb

## 2020-04-18 DIAGNOSIS — G4709 Other insomnia: Secondary | ICD-10-CM

## 2020-04-18 DIAGNOSIS — F321 Major depressive disorder, single episode, moderate: Secondary | ICD-10-CM | POA: Diagnosis not present

## 2020-04-18 NOTE — Progress Notes (Signed)
Virtual Visit via Telephone Note   This visit type was conducted due to national recommendations for restrictions regarding the COVID-19 Pandemic (e.g. social distancing) in an effort to limit this patient's exposure and mitigate transmission in our community.  Due to his co-morbid illnesses, this patient is at least at moderate risk for complications without adequate follow up.  This format is felt to be most appropriate for this patient at this time.  The patient did not have access to video technology/had technical difficulties with video requiring transitioning to audio format only (telephone).  All issues noted in this document were discussed and addressed.  No physical exam could be performed with this format.    Evaluation Performed:  Follow-up visit  Date:  04/18/2020   Douglas Keller, DOB [birth year], MRN [id number]  Patient Location: Home Provider Location: Office/Clinic  Location of Patient: Home Location of Provider: Telehealth Consent was obtain for visit to be over via telehealth. I verified that I am speaking with the correct person using two identifiers.  PCP:  Perlie Mayo, NP   Chief Complaint: Follow-up on depression and insomnia  History of Present Illness:    Douglas Keller is a 79 y.o. male with history as stated below.  Overall reports that he is doing much well with his mood.  He never started the BuSpar he did not realize he was supposed to get it.  He said the pharmacy never called in.  He reports that he started taking melatonin to help with his sleep he reported he take it in the past and it stopped working for him but he wanted to try it again.  Reports that he has been sleeping much better and he reports that his mood has improved since sleeping better and he also reports that his blood pressure at improved since sleeping better.  He denies having any headaches, chest pain, cough, new shortness of breath or changes in breathing, dizziness or vision  changes.  He reports his appetite is well and he is eating good.  And overall no changes in bowel or bladder habits.  The patient does not have symptoms concerning for COVID-19 infection (fever, chills, cough, or new shortness of breath).   Past Medical, Surgical, Social History, Allergies, and Medications have been Reviewed.  Past Medical History:  Diagnosis Date  . Aortic valve sclerosis   . Atherosclerosis of coronary artery without angina pectoris 07/05/2014  . Cervical spondylosis without myelopathy 02/13/2013  . Chest pain 01/29/2019  . Degeneration of cervical intervertebral disc 02/13/2013  . Depression    sometimes  . Diabetes mellitus type 2, diet-controlled ([place])   . Elevated troponin   . GERD (gastroesophageal reflux disease)   . Heart murmur   . HTN (hypertension) 07/06/2013  . Hypercholesterolemia 08/21/2015  . Hyperlipidemia 07/06/2013  . Medication management 08/21/2015  . Pre-diabetes   . Rectal bleeding 09/21/2019  . Stiffness of left shoulder joint 07/19/2013   Past Surgical History:  Procedure Laterality Date  . BIOPSY  12/14/2019   Procedure: BIOPSY;  Surgeon: Daneil Dolin, MD;  [facility];  Service: Endoscopy;;  . CERVICAL SPINE SURGERY    . COLONOSCOPY  07/26/2012   Procedure: COLONOSCOPY;  Surgeon: Jamesetta So, MD;  Location: [facility];  Service: Gastroenterology;  Laterality: N/A;  . COLONOSCOPY WITH PROPOFOL N/A 12/14/2019   Procedure: COLONOSCOPY WITH PROPOFOL;  Surgeon: Daneil Dolin, MD;  Location: [facility];  Service: Endoscopy;  Laterality:  N/A;  9:00am  . KNEE ARTHROSCOPY Left    torn ligament  . LUMBAR LAMINECTOMY/DECOMPRESSION MICRODISCECTOMY Bilateral 12/27/2019   Procedure: BIATERAL LUMBAR TWO- LUMBAR THREE, LUMBAR THREE- LUMBAR FOUR, LUMBAR FOUR-FIVE LAMINOTOMY/FORAMINOTOMY 3 LEVELS;  Surgeon: Newman Pies, MD;  Location: [facility];  Service: Neurosurgery;  Laterality: Bilateral;  POSTERIOR  . SPINE SURGERY N/A    Phreesia  02/24/2020     Current Meds  Medication Sig  . acetaminophen (TYLENOL) 650 MG CR tablet Take 1,300 mg by mouth every 8 (eight) hours as needed for pain.   Marland Kitchen amLODipine (NORVASC) 10 MG tablet Take 10 mg by mouth daily.  Marland Kitchen aspirin EC 81 MG tablet Take 81 mg by mouth daily.  . benazepril (LOTENSIN) 40 MG tablet Take 40 mg by mouth daily.  Marland Kitchen buPROPion (WELLBUTRIN XL) 150 MG 24 hr tablet Take 1 tablet (150 mg total) by mouth daily.  Marland Kitchen gabapentin (NEURONTIN) 300 MG capsule Take 300 mg by mouth 3 (three) times daily.  Marland Kitchen GLUCOSAMINE HCL PO Take 1 tablet by mouth at bedtime.  . magnesium oxide (MAG-OX) 400 MG tablet Take 400 mg by mouth daily.  . meclizine (ANTIVERT) 12.5 MG tablet Take 1 tablet (12.5 mg total) by mouth 2 (two) times daily as needed for dizziness.  . multivitamin-iron-minerals-folic acid (CENTRUM) chewable tablet Chew 1 tablet by mouth daily.  . pantoprazole (PROTONIX) 40 MG tablet Take 40 mg by mouth daily.  . potassium chloride SA (KLOR-CON) 20 MEQ tablet Take 1 tablet (20 mEq total) by mouth daily.  Marland Kitchen REPATHA SURECLICK 417 MG/ML SOAJ INJECT 140 MG INTO THE SKIN EVERY 14 DAY  . spironolactone-hydrochlorothiazide (ALDACTAZIDE) 25-25 MG tablet Take 1 tablet by mouth daily.     Allergies:   Statins   ROS:   Please see the history of present illness.    All other systems reviewed and are negative.   Labs/Other Tests and Data Reviewed:    Recent Labs: 08/17/2019: ALT 24; Hemoglobin 13.7; Platelets 295 09/08/2019: Magnesium 2.3 12/12/2019: BUN 17; Creatinine, Ser 1.23; Potassium 3.8; Sodium 138   Recent Lipid Panel Lab Results  Component Value Date/Time   CHOL 142 06/02/2019 08:14 AM   TRIG 112 06/02/2019 08:14 AM   HDL 55 06/02/2019 08:14 AM   CHOLHDL 2.6 06/02/2019 08:14 AM   CHOLHDL 3.3 01/29/2019 08:00 AM   LDLCALC 67 06/02/2019 08:14 AM    Wt Readings from Last 3 Encounters:  04/18/20 206 lb (93.4 kg)  03/08/20 206 lb 1.3 oz (93.5 kg)  02/28/20 199 lb (90.3 kg)       Objective:    Vital Signs:  BP (!) 143/83   Ht 5\' 6"  (1.676 m)   Wt 206 lb (93.4 kg)   BMI 33.25 kg/m    VITAL SIGNS:  reviewed GEN:  alert and oriented RESPIRATORY:  no shortness of breath in conversation  PSYCH:  normal affect and mood    Depression screen [facility] 2/9 04/18/2020 03/08/2020 02/28/2020 01/17/2020 11/29/2019  Decreased Interest 0 0 0 0 0  Down, Depressed, Hopeless 0 0 0 0 0  PHQ - 2 Score 0 0 0 0 0  Altered sleeping - - 0 0 3  Tired, decreased energy - - 0 0 3  Change in appetite - - 0 0 1  Feeling bad or failure about yourself  - - 0 0 0  Trouble concentrating - - 0 0 0  Moving slowly or fidgety/restless - - 0 0 0  Suicidal thoughts - - 0  0 0  PHQ-9 Score - - 0 0 7  Difficult doing work/chores Not difficult at all - Not difficult at all Not difficult at all Somewhat difficult     ASSESSMENT & PLAN:    1. Other insomnia   2. Depression, major, single episode, moderate ([place])   Time:   Today, I have spent 7 minutes with the patient with telehealth technology discussing the above problems.     Medication Adjustments/Labs and Tests Ordered: Current medicines are reviewed at length with the patient today.  Concerns regarding medicines are outlined above.   Tests Ordered: No orders of the defined types were placed in this encounter.   Medication Changes: No orders of the defined types were placed in this encounter.   Disposition:  Follow up 3-4 months  Signed, Perlie Mayo, NP  04/18/2020 8:43 AM     [facility]

## 2020-04-18 NOTE — Assessment & Plan Note (Signed)
Notes that he is feeling better since restarting Melatonin. In the past this was not as helpful, but he decided to try it again and he reports feeling much better since sleeping better. He reports BP has improved and his mood as well.  Advised to continue to take melatonin and to call back if needed.

## 2020-04-18 NOTE — Patient Instructions (Signed)
I appreciate the opportunity to provide you with care for your health and wellness. Today we discussed: mood and sleep  Follow up: Jan   No labs or referrals today  So glad you are sleeping better! Hope you get released to go golfing soon!  Please continue to practice social distancing to keep you, your family, and our community safe.  If you must go out, please wear a mask and practice good handwashing.  It was a pleasure to see you and I look forward to continuing to work together on your health and well-being. Please do not hesitate to call the office if you need care or have questions about your care.  Have a wonderful day and week. With Gratitude, Cherly Beach, DNP, AGNP-BC

## 2020-04-18 NOTE — Assessment & Plan Note (Signed)
Improved since sleep has improved. Continue melatonin and Wellbutrin for now. He never started Buspar. That was removed from his med list. Denies SI and HI

## 2020-04-19 DIAGNOSIS — M48062 Spinal stenosis, lumbar region with neurogenic claudication: Secondary | ICD-10-CM | POA: Diagnosis not present

## 2020-04-19 DIAGNOSIS — I10 Essential (primary) hypertension: Secondary | ICD-10-CM | POA: Diagnosis not present

## 2020-04-19 DIAGNOSIS — M542 Cervicalgia: Secondary | ICD-10-CM | POA: Diagnosis not present

## 2020-04-19 DIAGNOSIS — Z6832 Body mass index (BMI) 32.0-32.9, adult: Secondary | ICD-10-CM | POA: Diagnosis not present

## 2020-05-03 ENCOUNTER — Ambulatory Visit (INDEPENDENT_AMBULATORY_CARE_PROVIDER_SITE_OTHER): Payer: Medicare HMO

## 2020-05-03 ENCOUNTER — Other Ambulatory Visit: Payer: Self-pay

## 2020-05-03 DIAGNOSIS — Z23 Encounter for immunization: Secondary | ICD-10-CM | POA: Diagnosis not present

## 2020-05-10 ENCOUNTER — Telehealth: Payer: Self-pay | Admitting: Cardiovascular Disease

## 2020-05-10 NOTE — Telephone Encounter (Signed)
Spoke with pt, he reports SOB for about 2 months now and it is getting worse. He denies edema in his feet and legs but repots his abdomen is larger. His weight is up 9 lbs in about 3 weeks. He gets tried easier than before and feels bad after mowing the grass for 10 min. Reports he feels like he is going to get sick. He gets SOB with mowing or walking to the mail box but not with activity within the home. He does not sleep well but denies orthopnea. He reports he has had dizziness for some time and it has not changed. Follow up scheduled next week with app. He will go to the ER if symptoms get worse.

## 2020-05-10 NOTE — Telephone Encounter (Signed)
    Pt c/o Shortness Of Breath: STAT if SOB developed within the last 24 hours or pt is noticeably SOB on the phone  1. Are you currently SOB (can you hear that pt is SOB on the phone)? No  2. How long have you been experiencing SOB? Been a month  3. Are you SOB when sitting or when up moving around? Moving around  4. Are you currently experiencing any other symptoms? Dizziness  Pt said he started feeling SOB a month ago and now its getting worst, he had trouble sleeping and when he walks around he gets SOB right away he will feel dizzy and feel like passing out. He wanted to see Dr. Loletha Grayer or APP but no available OV until 12/07. He said he needs to be seen sooner

## 2020-05-13 NOTE — Progress Notes (Signed)
Cardiology Office Note:    Date:  05/14/2020   ID:  Douglas Keller, DOB [birth year], MRN [id number]  PCP:  Douglas Mayo, NP  Cardiologist:  Douglas Klein, MD   Referring MD: Douglas Mayo, NP   Chief Complaint  Patient presents with  . Follow-up    PND, weight gain, fatigue, DOE    History of Present Illness:    Douglas Keller is a 79 y.o. male with a hx of HTN, coronary atherosclerosis, DM2, GERD, obesity, and HLD. Heart cath 2006 with nonobstructive CAD, reassuring nuclear stress test in 2018. Normal ABIs 08/2018. Event monitor in 05/2017 showed bradycardia with possible chronotropic incompetence. Echo 2020 with EF 45-50%,which was similar to nuclear stress test in 2016 which reported EF of 48%. He also has mild diastolic dysfunction. He went to the ER 01/30/19 with chest pain and underwent nuclear stress test that was negative for ischemia and evidence of soft tissue attenuation.   He has suffered a decreased in activity due to back pain and had back surgery in June 2021. He has been maintained on amlodipine, benazepril, and spironolactone,  He was last seen by Dr. Sallyanne Keller 10/17/19 and was doing well at that time.   He presents today for 6 months of shortness of breath mainly with exertion that is worsening. He also complained of dizziness, for which he has had extensive workup at [facility]. His main complaint is fatigue - he can't do what he used to do. He denies edema, but does report increased abdominal girth. Weight is up 9-11 lbs in 3 weeks. He now is fatigued after mowing the grass for only 10 minutes or walking to the mailbox. He also describes PND that forces him to move to the recliner in the middle of the night. He also reports right shoulder and arm pain that may not be new, may be arthritis. EKG today with with sinus rhythm.     Past Medical History:  Diagnosis Date  . Aortic valve sclerosis   . Atherosclerosis of coronary artery without angina pectoris 07/05/2014  .  Cervical spondylosis without myelopathy 02/13/2013  . Chest pain 01/29/2019  . Degeneration of cervical intervertebral disc 02/13/2013  . Depression    sometimes  . Diabetes mellitus type 2, diet-controlled ([place])   . Elevated troponin   . GERD (gastroesophageal reflux disease)   . Heart murmur   . HTN (hypertension) 07/06/2013  . Hypercholesterolemia 08/21/2015  . Hyperlipidemia 07/06/2013  . Medication management 08/21/2015  . Pre-diabetes   . Rectal bleeding 09/21/2019  . Stiffness of left shoulder joint 07/19/2013    Past Surgical History:  Procedure Laterality Date  . BIOPSY  12/14/2019   Procedure: BIOPSY;  Surgeon: Daneil Dolin, MD;  [facility];  Service: Endoscopy;;  . CERVICAL SPINE SURGERY    . COLONOSCOPY  07/26/2012   Procedure: COLONOSCOPY;  Surgeon: Jamesetta So, MD;  [facility];  Service: Gastroenterology;  Laterality: N/A;  . COLONOSCOPY WITH PROPOFOL N/A 12/14/2019   Procedure: COLONOSCOPY WITH PROPOFOL;  Surgeon: Daneil Dolin, MD;  Location[facility];  Service: Endoscopy;  Laterality: N/A;  9:00am  . KNEE ARTHROSCOPY Left    torn ligament  . LUMBAR LAMINECTOMY/DECOMPRESSION MICRODISCECTOMY Bilateral 12/27/2019   Procedure: BIATERAL LUMBAR TWO- LUMBAR THREE, LUMBAR THREE- LUMBAR FOUR, LUMBAR FOUR-FIVE LAMINOTOMY/FORAMINOTOMY 3 LEVELS;  Surgeon: Newman Pies, MD;  Location: [place];  Service: Neurosurgery;  Laterality: Bilateral;  POSTERIOR  . SPINE SURGERY N/A    Phreesia  02/24/2020    Current Medications: Current Meds  Medication Sig  . acetaminophen (TYLENOL) 650 MG CR tablet Take 1,300 mg by mouth every 8 (eight) hours as needed for pain.   Marland Kitchen amLODipine (NORVASC) 10 MG tablet Take 10 mg by mouth daily.  Marland Kitchen aspirin EC 81 MG tablet Take 81 mg by mouth daily.  . benazepril (LOTENSIN) 40 MG tablet Take 40 mg by mouth daily.  Marland Kitchen buPROPion (WELLBUTRIN XL) 150 MG 24 hr tablet Take 1 tablet (150 mg total) by mouth daily.  Marland Kitchen gabapentin  (NEURONTIN) 300 MG capsule Take 300 mg by mouth 3 (three) times daily.  Marland Kitchen GLUCOSAMINE HCL PO Take 1 tablet by mouth at bedtime.  . magnesium oxide (MAG-OX) 400 MG tablet Take 400 mg by mouth daily.  . meclizine (ANTIVERT) 12.5 MG tablet Take 1 tablet (12.5 mg total) by mouth 2 (two) times daily as needed for dizziness.  . multivitamin-iron-minerals-folic acid (CENTRUM) chewable tablet Chew 1 tablet by mouth daily.  . pantoprazole (PROTONIX) 40 MG tablet Take 40 mg by mouth daily.  . potassium chloride SA (KLOR-CON) 20 MEQ tablet Take 1 tablet (20 mEq total) by mouth daily.  Marland Kitchen REPATHA SURECLICK 366 MG/ML SOAJ INJECT 140 MG INTO THE SKIN EVERY 14 DAY  . spironolactone-hydrochlorothiazide (ALDACTAZIDE) 25-25 MG tablet Take 1 tablet by mouth daily.     Allergies:   Statins   Social History   Socioeconomic History  . Marital status: Widowed    Spouse name: Douglas Keller   . Number of children: 1  . Years of education: Not on file  . Highest education level: 12th grade  Occupational History    Comment: Retired   Tobacco Use  . Smoking status: Never Smoker  . Smokeless tobacco: Never Used  Vaping Use  . Vaping Use: Never used  Substance and Sexual Activity  . Alcohol use: No  . Drug use: No  . Sexual activity: Not Currently  Other Topics Concern  . Not on file  Social History Narrative   Live alone right now, wife passed in 2009    Addy 2 live in [place]      Enjoys playing golf      Diet: eats what he likes, sweet, fast food, does not eat a lot of canned or boxed foods.    Does not eat enough fruits and veggies.       Caffeine: 1 cup daily   Water: 3-4 bottles of water daily, Gatorade every now and then         Wears seat belt   Wears sun protection when golfing   Smoke detectors at home   Does not use phone while driving   Social Determinants of Health   Financial Resource Strain: Low Risk   . Difficulty of Paying Living Expenses: Not hard at all    Food Insecurity: No Food Insecurity  . Worried About Charity fundraiser in the Last Year: Never true  . Ran Out of Food in the Last Year: Never true  Transportation Needs: No Transportation Needs  . Lack of Transportation (Medical): No  . Lack of Transportation (Non-Medical): No  Physical Activity: Insufficiently Active  . Days of Exercise per Week: 2 days  . Minutes of Exercise per Session: 10 min  Stress: No Stress Concern Present  . Feeling of Stress : Not at all  Social Connections: Moderately Isolated  . Frequency of Communication with Friends and Family: More than three times a week  .  Frequency of Social Gatherings with Friends and Family: More than three times a week  . Attends Religious Services: More than 4 times per year  . Active Member of Clubs or Organizations: No  . Attends Archivist Meetings: Never  . Marital Status: Widowed     Family History: The patient's family history includes Asthma in his mother; Hypertension in his father. There is no history of Colon cancer.  ROS:   Please see the history of present illness.     All other systems reviewed and are negative.  EKGs/Labs/Other Studies Reviewed:    The following studies were reviewed today:  Echo 01/29/19: 1. The left ventricle has mildly reduced systolic function, with an  ejection fraction of 45-50%. The cavity size was normal. There is mildly  increased left ventricular wall thickness. Left ventricular diastolic  Doppler parameters are consistent with  impaired relaxation. Elevated left ventricular end-diastolic pressure The  E/e' is 15.1.  2. Probable lateral wall motion abnormality with moderate hypokinesis. LV  endocardial border is not optimally visualized in traditional views and LV  is foreshortened, therefore exact localization of wall motion  abnormalities challenging to discern.  3. The right ventricle has normal systolic function. The cavity was  normal. There is no increase  in right ventricular wall thickness.  4. The aortic valve is abnormal. Mild calcification of the aortic valve.  No stenosis of the aortic valve. Mild aortic annular calcification noted.  EKG:  EKG is ordered today.  The ekg ordered today demonstrates sinus rhythm HR 63  Recent Labs: 08/17/2019: ALT 24; Hemoglobin 13.7; Platelets 295 09/08/2019: Magnesium 2.3 12/12/2019: BUN 17; Creatinine, Ser 1.23; Potassium 3.8; Sodium 138  Recent Lipid Panel    Component Value Date/Time   CHOL 142 06/02/2019 0814   TRIG 112 06/02/2019 0814   HDL 55 06/02/2019 0814   CHOLHDL 2.6 06/02/2019 0814   CHOLHDL 3.3 01/29/2019 0800   VLDL 30 01/29/2019 0800   LDLCALC 67 06/02/2019 0814    Physical Exam:    VS:  BP (!) 144/82   Pulse 63   Ht 5\' 6"  (1.676 m)   Wt 208 lb (94.3 kg)   SpO2 96%   BMI 33.57 kg/m     Wt Readings from Last 3 Encounters:  05/14/20 208 lb (94.3 kg)  04/18/20 206 lb (93.4 kg)  03/08/20 206 lb 1.3 oz (93.5 kg)     GEN: obese male in no acute distress HEENT: Normal NECK: No JVD; No carotid bruits LYMPHATICS: No lymphadenopathy CARDIAC: RRR, no murmurs, rubs, gallops RESPIRATORY:  Clear to auscultation without rales, wheezing or rhonchi  ABDOMEN: Soft, non-tender, non-distended MUSCULOSKELETAL:  No edema; No deformity  SKIN: Warm and dry NEUROLOGIC:  Alert and oriented x 3 PSYCHIATRIC:  Normal affect   ASSESSMENT:    1. Dyspnea on exertion   2. Fatigue, unspecified type   3. Coronary artery disease involving native coronary artery of native heart without angina pectoris   4. Hyperlipidemia, unspecified hyperlipidemia type   5. Essential hypertension   6. Chronic combined systolic and diastolic heart failure ([place])   7. Sinus bradycardia   8. Bilateral leg pain   9. Diabetes mellitus type 2 in obese ([place])   10. Obesity (BMI 30.0-34.9)    PLAN:    In order of problems listed above:  Dyspnea on exertion Fatigue - weight gain of 11 lbs and he is eating  less - increased abdominal girth, PND - he takes 25 mg spironolactone and  potassium - will check a BMP and BNP today - start 10 mg lasix - repeat echocardiogram - will collect TSH, CBC, BMP, and BNP today - as suggested by Dr. Sallyanne Keller, he may have a component of chronotropic incompetence - not on a BB - due to his new and worsening symptoms, I will obtain echocardiogram and CT coronary - due to baseline bradycardia, I will provide 25 mg lopressor x 2 tablets to bring with him to the study   Hypertension - amlodipine, benazepril, spiro - no BB due to bradycardia - pressure elevated here, did not take his morning medication - generally running 130/80 at home   Hyperlipidemia - continue repatha - now taking once every 3 weeks due to increase in cost, per PharmD - likely familial hypercholesterolemia    CAD - nonobstructive on remote cath - reassuring stress test 2016, 2018, 2020 - risk factor modification - he does not describe frank angina, but significant fatigue, dyspnea on exertion, and PND which may be his anginal equivalent - CT coronary as above   Sinus bradycardia - avoid BB  - adjusted lopressor dose for CT coronary as above   CHF - echo with EF 45-50% and DD - Dr. Sallyanne Keller suspects this is due to hypertension - will repeat an echo   Bilateral leg pain - normal ABIs 08/2019   DM - A1c controlled 6.5%   Obesity - has been limited by back problems   Labs Echo CTc lasix 10 mg - if no increase in urination, will need to go to 20 mg ?still need K given spiro  Follow up in 6 weeks.  Medication Adjustments/Labs and Tests Ordered: Current medicines are reviewed at length with the patient today.  Concerns regarding medicines are outlined above.  Orders Placed This Encounter  Procedures  . CT CORONARY MORPH W/CTA COR W/SCORE W/CA W/CM &/OR WO/CM  . CT CORONARY FRACTIONAL FLOW RESERVE DATA PREP  . CT CORONARY FRACTIONAL FLOW RESERVE FLUID ANALYSIS  . Basic  metabolic panel  . Pro b natriuretic peptide ([facility]NP)[facility])  . CBC  . TSH  . EKG 12-Lead  . ECHOCARDIOGRAM COMPLETE   Meds ordered this encounter  Medications  . furosemide (LASIX) 20 MG tablet    Sig: Take 0.5 tablets (10 mg total) by mouth daily.    Dispense:  45 tablet    Refill:  2  . metoprolol tartrate (LOPRESSOR) 25 MG tablet    Sig: Bring 2 Tablets with you Day of Test    Dispense:  2 tablet    Refill:  0    Signed, Ledora Bottcher, PA  05/14/2020 10:00 AM    [place]

## 2020-05-14 ENCOUNTER — Encounter: Payer: Self-pay | Admitting: Physician Assistant

## 2020-05-14 ENCOUNTER — Ambulatory Visit: Payer: Medicare HMO | Admitting: Physician Assistant

## 2020-05-14 ENCOUNTER — Other Ambulatory Visit: Payer: Self-pay

## 2020-05-14 VITALS — BP 144/82 | HR 63 | Ht 66.0 in | Wt 208.0 lb

## 2020-05-14 DIAGNOSIS — I2510 Atherosclerotic heart disease of native coronary artery without angina pectoris: Secondary | ICD-10-CM | POA: Diagnosis not present

## 2020-05-14 DIAGNOSIS — R001 Bradycardia, unspecified: Secondary | ICD-10-CM | POA: Diagnosis not present

## 2020-05-14 DIAGNOSIS — E1169 Type 2 diabetes mellitus with other specified complication: Secondary | ICD-10-CM | POA: Diagnosis not present

## 2020-05-14 DIAGNOSIS — R5383 Other fatigue: Secondary | ICD-10-CM | POA: Diagnosis not present

## 2020-05-14 DIAGNOSIS — M79604 Pain in right leg: Secondary | ICD-10-CM

## 2020-05-14 DIAGNOSIS — I10 Essential (primary) hypertension: Secondary | ICD-10-CM

## 2020-05-14 DIAGNOSIS — I159 Secondary hypertension, unspecified: Secondary | ICD-10-CM | POA: Diagnosis not present

## 2020-05-14 DIAGNOSIS — I5042 Chronic combined systolic (congestive) and diastolic (congestive) heart failure: Secondary | ICD-10-CM

## 2020-05-14 DIAGNOSIS — E785 Hyperlipidemia, unspecified: Secondary | ICD-10-CM

## 2020-05-14 DIAGNOSIS — R0600 Dyspnea, unspecified: Secondary | ICD-10-CM

## 2020-05-14 DIAGNOSIS — M79605 Pain in left leg: Secondary | ICD-10-CM

## 2020-05-14 DIAGNOSIS — E669 Obesity, unspecified: Secondary | ICD-10-CM

## 2020-05-14 DIAGNOSIS — R0609 Other forms of dyspnea: Secondary | ICD-10-CM

## 2020-05-14 MED ORDER — FUROSEMIDE 20 MG PO TABS
10.0000 mg | ORAL_TABLET | Freq: Every day | ORAL | 2 refills | Status: DC
Start: 2020-05-14 — End: 2020-05-16

## 2020-05-14 MED ORDER — METOPROLOL TARTRATE 25 MG PO TABS
ORAL_TABLET | ORAL | 0 refills | Status: DC
Start: 2020-05-14 — End: 2020-07-24

## 2020-05-14 NOTE — Patient Instructions (Signed)
Medication Instructions:  Start Lasix 20mg  ( Half Tablet Daily 10mg  ) *If you need a refill on your cardiac medications before your next appointment, please call your pharmacy*   Lab Work: [facility] If you have labs (blood work) drawn today and your tests are completely normal, you will receive your results only by: Marland Kitchen MyChart Message (if you have MyChart) OR . A paper copy in the mail If you have any lab test that is abnormal or we need to change your treatment, we will call you to review the results.   Testing/Procedures: [facility] [address] Your physician has requested that you have an echocardiogram. Echocardiography is a painless test that uses sound waves to create images of your heart. It provides your doctor with information about the size and shape of your heart and how well your heart's chambers and valves are working. This procedure takes approximately one hour. There are no restrictions for this procedure.  [facility] [address] Your physician has requested that you have cardiac CT. Cardiac computed tomography (CT) is a painless test that uses an x-ray machine to take clear, detailed pictures of your heart. For further information please visit [email or web address]. Please follow instruction sheet as given.    Follow-Up: At [facility], you and your health needs are our priority.  As part of our continuing mission to provide you with exceptional heart care, we have created designated Provider Care Teams.  These Care Teams include your primary Cardiologist (physician) and Advanced Practice Providers (APPs -  Physician Assistants and Nurse Practitioners) who all work together to provide you with the care you need, when you need it.  Your next appointment:   6 week(s)  The format for your next appointment:   In Person  Provider:   Sanda Klein, MD

## 2020-05-14 NOTE — Progress Notes (Signed)
Thanks, Angie. I must say I rarely think furosemide 10 mg po does much and would increase to at least 20 mg daily. He is on the spironolactone-HCTZ combo and needed additional K for hypokalemia. Would continue KCl until you get the f/u BMET. Thanks!

## 2020-05-15 LAB — CBC
Hematocrit: 41.9 % (ref 37.5–51.0)
Hemoglobin: 14.1 g/dL (ref 13.0–17.7)
MCH: 29.8 pg (ref 26.6–33.0)
MCHC: 33.7 g/dL (ref 31.5–35.7)
MCV: 89 fL (ref 79–97)
Platelets: 326 10*3/uL (ref 150–450)
RBC: 4.73 x10E6/uL (ref 4.14–5.80)
RDW: 12.8 % (ref 11.6–15.4)
WBC: 8 10*3/uL (ref 3.4–10.8)

## 2020-05-15 LAB — BASIC METABOLIC PANEL
BUN/Creatinine Ratio: 11 (ref 10–24)
BUN: 13 mg/dL (ref 8–27)
CO2: 23 mmol/L (ref 20–29)
Calcium: 9.4 mg/dL (ref 8.6–10.2)
Chloride: 103 mmol/L (ref 96–106)
Creatinine, Ser: 1.18 mg/dL (ref 0.76–1.27)
GFR calc Af Amer: 68 mL/min/{1.73_m2} (ref >59)
GFR calc non Af Amer: 59 mL/min/{1.73_m2} — ABNORMAL LOW (ref >59)
Glucose: 123 mg/dL — ABNORMAL HIGH (ref 65–99)
Potassium: 5.1 mmol/L (ref 3.5–5.2)
Sodium: 141 mmol/L (ref 134–144)

## 2020-05-15 LAB — PRO B NATRIURETIC PEPTIDE: NT-Pro BNP: 25 pg/mL (ref 0–486)

## 2020-05-15 LAB — TSH: TSH: 1.67 u[IU]/mL (ref 0.450–4.500)

## 2020-05-16 ENCOUNTER — Telehealth: Payer: Self-pay | Admitting: Physician Assistant

## 2020-05-16 MED ORDER — FUROSEMIDE 20 MG PO TABS: 20.0000 mg | ORAL_TABLET | Freq: Every day | ORAL | 3 refills | Status: DC

## 2020-05-16 NOTE — Telephone Encounter (Signed)
Silverio Lay, RN  05/15/2020 6:24 PM EDT Back to Top    Left message to call back Message sent via [place], [name]  05/15/2020 11:06 AM EDT     Your labs look good and do not show extra fluid. In consultation with Dr. Sallyanne Kuster, we will increase lasix to 20 mg and monitor results.   Hayley - pt told me he was not taking HCTZ - please confirm this with him.

## 2020-05-16 NOTE — Telephone Encounter (Signed)
° °  Pt is returning call to get lab result °

## 2020-05-16 NOTE — Telephone Encounter (Signed)
Patient called w/results Voiced understanding of lasix dose increase Updated Rx sent to [facility]  He is taking spironolactone-hctz combination - not separate tablets Will route to [place] PA

## 2020-05-17 ENCOUNTER — Other Ambulatory Visit: Payer: Self-pay

## 2020-05-17 ENCOUNTER — Ambulatory Visit (HOSPITAL_COMMUNITY)
Admission: RE | Admit: 2020-05-17 | Discharge: 2020-05-17 | Disposition: A | Discharge status: Home / Self Care | Payer: Medicare HMO | Source: Ambulatory Visit | Attending: Physician Assistant | Admitting: Physician Assistant

## 2020-05-17 DIAGNOSIS — E785 Hyperlipidemia, unspecified: Secondary | ICD-10-CM | POA: Diagnosis not present

## 2020-05-17 DIAGNOSIS — R0609 Other forms of dyspnea: Secondary | ICD-10-CM

## 2020-05-17 DIAGNOSIS — R0600 Dyspnea, unspecified: Secondary | ICD-10-CM | POA: Diagnosis not present

## 2020-05-17 DIAGNOSIS — I2510 Atherosclerotic heart disease of native coronary artery without angina pectoris: Secondary | ICD-10-CM | POA: Diagnosis not present

## 2020-05-17 LAB — ECHOCARDIOGRAM COMPLETE
AR max vel: 1.55 cm2
AV Area VTI: 1.58 cm2
AV Area mean vel: 1.42 cm2
AV Mean grad: 5.5 mmHg
AV Peak grad: 9.6 mmHg
Ao pk vel: 1.55 m/s
Area-P 1/2: 2.21 cm2
P 1/2 time: 753 msec
S' Lateral: 3.38 cm

## 2020-05-17 NOTE — Progress Notes (Signed)
*  PRELIMINARY RESULTS* Echocardiogram 2D Echocardiogram has been performed.  Douglas Keller 05/17/2020, 9:36 AM

## 2020-05-24 ENCOUNTER — Other Ambulatory Visit: Payer: Self-pay | Admitting: Family Medicine

## 2020-05-24 DIAGNOSIS — F321 Major depressive disorder, single episode, moderate: Secondary | ICD-10-CM

## 2020-05-30 ENCOUNTER — Other Ambulatory Visit: Payer: Self-pay

## 2020-05-30 DIAGNOSIS — F321 Major depressive disorder, single episode, moderate: Secondary | ICD-10-CM

## 2020-05-30 MED ORDER — FUROSEMIDE 20 MG PO TABS: 20.0000 mg | ORAL_TABLET | Freq: Every day | ORAL | 3 refills | Status: DC

## 2020-05-30 MED ORDER — BUPROPION HCL ER (XL) 150 MG PO TB24: 150.0000 mg | ORAL_TABLET | Freq: Every day | ORAL | 0 refills | Status: DC

## 2020-05-30 MED ORDER — PANTOPRAZOLE SODIUM 40 MG PO TBEC
40.0000 mg | DELAYED_RELEASE_TABLET | Freq: Every day | ORAL | 0 refills | Status: DC
Start: 2020-05-30 — End: 2020-08-12

## 2020-05-30 MED ORDER — GABAPENTIN 300 MG PO CAPS
300.0000 mg | ORAL_CAPSULE | Freq: Three times a day (TID) | ORAL | 0 refills | Status: DC
Start: 2020-05-30 — End: 2020-07-30

## 2020-05-30 MED ORDER — POTASSIUM CHLORIDE CRYS ER 20 MEQ PO TBCR
20.0000 meq | EXTENDED_RELEASE_TABLET | Freq: Every day | ORAL | 0 refills | Status: DC
Start: 2020-05-30 — End: 2020-08-01

## 2020-05-30 MED ORDER — AMLODIPINE BESYLATE 10 MG PO TABS: 10.0000 mg | ORAL_TABLET | Freq: Every day | ORAL | 0 refills | Status: DC

## 2020-06-07 ENCOUNTER — Telehealth (HOSPITAL_COMMUNITY): Payer: Self-pay | Admitting: Emergency Medicine

## 2020-06-07 NOTE — Telephone Encounter (Signed)
Reaching out to patient to offer assistance regarding upcoming cardiac imaging study; pt verbalizes understanding of appt date/time, parking situation and where to check in, pre-test NPO status and medications ordered, and verified current allergies; name and call back number provided for further questions should they arise [name] RN Navigator Cardiac Imaging [place] [facility] [phone] office [phone] cell  Pt to take 25mg  metoprolol tartrate 2 hr prior to scan. Avoiding diuretics

## 2020-06-11 ENCOUNTER — Other Ambulatory Visit: Payer: Self-pay

## 2020-06-11 ENCOUNTER — Encounter (HOSPITAL_COMMUNITY): Payer: Self-pay

## 2020-06-11 ENCOUNTER — Ambulatory Visit (HOSPITAL_COMMUNITY)
Admission: RE | Admit: 2020-06-11 | Discharge: 2020-06-11 | Disposition: A | Discharge status: Home / Self Care | Payer: Medicare HMO | Source: Ambulatory Visit | Attending: Physician Assistant | Admitting: Physician Assistant

## 2020-06-11 DIAGNOSIS — I2510 Atherosclerotic heart disease of native coronary artery without angina pectoris: Secondary | ICD-10-CM

## 2020-06-11 DIAGNOSIS — R0600 Dyspnea, unspecified: Secondary | ICD-10-CM | POA: Diagnosis not present

## 2020-06-11 DIAGNOSIS — R0609 Other forms of dyspnea: Secondary | ICD-10-CM

## 2020-06-11 DIAGNOSIS — E785 Hyperlipidemia, unspecified: Secondary | ICD-10-CM | POA: Diagnosis not present

## 2020-06-11 MED ORDER — IOHEXOL 350 MG/ML SOLN
80.0000 mL | Freq: Once | INTRAVENOUS | Status: AC | PRN
  Administered 2020-06-11: 80 mL via INTRAVENOUS

## 2020-06-11 MED ORDER — NITROGLYCERIN 0.4 MG SL SUBL
0.8000 mg | SUBLINGUAL_TABLET | Freq: Once | SUBLINGUAL | Status: AC
  Administered 2020-06-11: 0.8 mg via SUBLINGUAL

## 2020-06-11 MED ORDER — NITROGLYCERIN 0.4 MG SL SUBL
SUBLINGUAL_TABLET | SUBLINGUAL | Status: AC
  Filled 2020-06-11: qty 2

## 2020-06-12 ENCOUNTER — Ambulatory Visit (HOSPITAL_COMMUNITY)
Admission: RE | Admit: 2020-06-12 | Discharge: 2020-06-12 | Disposition: A | Discharge status: Home / Self Care | Payer: Medicare HMO | Source: Ambulatory Visit | Attending: Physician Assistant | Admitting: Physician Assistant

## 2020-06-12 DIAGNOSIS — E785 Hyperlipidemia, unspecified: Secondary | ICD-10-CM

## 2020-06-12 DIAGNOSIS — I2510 Atherosclerotic heart disease of native coronary artery without angina pectoris: Secondary | ICD-10-CM | POA: Diagnosis not present

## 2020-06-12 DIAGNOSIS — R0600 Dyspnea, unspecified: Secondary | ICD-10-CM | POA: Insufficient documentation

## 2020-06-12 DIAGNOSIS — R0609 Other forms of dyspnea: Secondary | ICD-10-CM

## 2020-06-15 DIAGNOSIS — I2510 Atherosclerotic heart disease of native coronary artery without angina pectoris: Secondary | ICD-10-CM | POA: Diagnosis not present

## 2020-06-27 ENCOUNTER — Other Ambulatory Visit: Payer: Self-pay | Admitting: Family Medicine

## 2020-07-18 ENCOUNTER — Ambulatory Visit: Payer: Medicare HMO | Admitting: Family Medicine

## 2020-07-24 ENCOUNTER — Other Ambulatory Visit: Payer: Self-pay

## 2020-07-24 ENCOUNTER — Ambulatory Visit (INDEPENDENT_AMBULATORY_CARE_PROVIDER_SITE_OTHER): Payer: Medicare HMO | Admitting: Family Medicine

## 2020-07-24 ENCOUNTER — Encounter: Payer: Self-pay | Admitting: Family Medicine

## 2020-07-24 VITALS — BP 148/73 | HR 71 | Temp 98.4°F | Resp 16 | Ht 66.0 in | Wt 208.0 lb

## 2020-07-24 DIAGNOSIS — G4709 Other insomnia: Secondary | ICD-10-CM

## 2020-07-24 DIAGNOSIS — E785 Hyperlipidemia, unspecified: Secondary | ICD-10-CM | POA: Diagnosis not present

## 2020-07-24 DIAGNOSIS — I10 Essential (primary) hypertension: Secondary | ICD-10-CM

## 2020-07-24 DIAGNOSIS — E1169 Type 2 diabetes mellitus with other specified complication: Secondary | ICD-10-CM | POA: Diagnosis not present

## 2020-07-24 DIAGNOSIS — R42 Dizziness and giddiness: Secondary | ICD-10-CM

## 2020-07-24 DIAGNOSIS — M4802 Spinal stenosis, cervical region: Secondary | ICD-10-CM | POA: Diagnosis not present

## 2020-07-24 DIAGNOSIS — M48062 Spinal stenosis, lumbar region with neurogenic claudication: Secondary | ICD-10-CM | POA: Diagnosis not present

## 2020-07-24 MED ORDER — BENAZEPRIL HCL 40 MG PO TABS: 40.0000 mg | ORAL_TABLET | Freq: Every day | ORAL | 1 refills | Status: DC

## 2020-07-24 NOTE — Progress Notes (Signed)
Subjective:  [name], male    DOB: 11-24-1940  Age: 80 y.o. MRN: [id number]  CC:  Chief Complaint  Patient presents with  . Follow-up    3 month follow up sleeping better       HPI  HPI Douglas Keller is a 80 y.o. male patient of mine with history as stated below. Presents today for follow up on insomnia. Overall reports that he is doing much well with his sleeping.  He reports that he started taking melatonin to help with his sleep he reported he take it in the past and it stopped working for him but he wanted to try it again.  Reports that he has been sleeping much better and he reports that his mood has improved since sleeping better and he also reports that his blood pressure at improved since sleeping better.  He denies having any headaches, chest pain, cough, new shortness of breath or changes in breathing, or vision changes.  He reports his appetite is well and he is eating good.  And overall no changes in bowel or bladder habits.  He does continue to have dizziness that has been worked up extensively without findings. I think medication could be the issue. He is willing to reduce his gabapentin to see if that is it.  Today patient denies signs and symptoms of COVID 19 infection including fever, chills, cough, shortness of breath, and headache. Past Medical, Surgical, Social History, Allergies, and Medications have been Reviewed.   Past Medical History:  Diagnosis Date  . Aortic valve sclerosis   . Atherosclerosis of coronary artery without angina pectoris 07/05/2014  . Cervical spondylosis without myelopathy 02/13/2013  . Chest pain 01/29/2019  . Degeneration of cervical intervertebral disc 02/13/2013  . Depression    sometimes  . Diabetes mellitus type 2, diet-controlled ([facility])   . Elevated troponin   . GERD (gastroesophageal reflux disease)   . Heart murmur   . HTN (hypertension) 07/06/2013  . Hypercholesterolemia 08/21/2015  . Hyperlipidemia  07/06/2013  . Medication management 08/21/2015  . Pre-diabetes   . Rectal bleeding 09/21/2019  . Stiffness of left shoulder joint 07/19/2013    Current Meds  Medication Sig  . acetaminophen (TYLENOL) 650 MG CR tablet Take 1,300 mg by mouth every 8 (eight) hours as needed for pain.   Marland Kitchen amLODipine (NORVASC) 10 MG tablet Take 1 tablet (10 mg total) by mouth daily.  Marland Kitchen aspirin EC 81 MG tablet Take 81 mg by mouth daily.  Marland Kitchen buPROPion (WELLBUTRIN XL) 150 MG 24 hr tablet Take 1 tablet (150 mg total) by mouth daily.  . furosemide (LASIX) 20 MG tablet Take 1 tablet (20 mg total) by mouth daily.  Marland Kitchen gabapentin (NEURONTIN) 300 MG capsule Take 1 capsule (300 mg total) by mouth 3 (three) times daily.  Marland Kitchen GLUCOSAMINE HCL PO Take 1 tablet by mouth at bedtime.  . magnesium oxide (MAG-OX) 400 MG tablet Take 400 mg by mouth daily.  . meclizine (ANTIVERT) 12.5 MG tablet Take 1 tablet (12.5 mg total) by mouth 2 (two) times daily as needed for dizziness.  . multivitamin-iron-minerals-folic acid (CENTRUM) chewable tablet Chew 1 tablet by mouth daily.  . pantoprazole (PROTONIX) 40 MG tablet Take 1 tablet (40 mg total) by mouth daily.  . potassium chloride SA (KLOR-CON) 20 MEQ tablet Take 1 tablet (20 mEq total) by mouth daily.  Marland Kitchen REPATHA SURECLICK 140 MG/ML SOAJ INJECT 140 MG INTO THE SKIN EVERY 14 DAY  . spironolactone-hydrochlorothiazide (  ALDACTAZIDE) 25-25 MG tablet Take 1 tablet by mouth daily.  . [DISCONTINUED] benazepril (LOTENSIN) 40 MG tablet Take 40 mg by mouth daily.    ROS:  Review of Systems  Constitutional: Negative.   HENT: Negative.   Eyes: Negative.   Respiratory: Negative.   Cardiovascular: Negative.   Gastrointestinal: Negative.   Genitourinary: Negative.   Musculoskeletal: Negative.   Skin: Negative.   Neurological: Negative.   Endo/Heme/Allergies: Negative.   Psychiatric/Behavioral: Negative.      Objective:   Today's Vitals: BP (!) 148/73 (BP Location: Right Arm, Patient Position:  Sitting, Cuff Size: Normal)   Pulse 71   Temp 98.4 F (36.9 C) (Oral)   Resp 16   Ht 5\' 6"  (1.676 m)   Wt 208 lb (94.3 kg)   SpO2 97%   BMI 33.57 kg/m  Vitals with BMI 07/24/2020 06/11/2020 06/11/2020  Height 5\' 6"  - -  Weight 208 lbs - -  BMI 123456 - -  Systolic 123456 [id number]  Diastolic 73 55 79  Pulse 71 63 64     Physical Exam Vitals and nursing note reviewed.  Constitutional:      Appearance: Normal appearance. He is well-developed and well-groomed. He is obese.  HENT:     Head: Normocephalic and atraumatic.     Right Ear: External ear normal.     Left Ear: External ear normal.     Mouth/Throat:     Comments: Mask in place Eyes:     General:        Right eye: No discharge.        Left eye: No discharge.     Conjunctiva/sclera: Conjunctivae normal.  Cardiovascular:     Rate and Rhythm: Normal rate and regular rhythm.     Pulses: Normal pulses.     Heart sounds: Normal heart sounds.  Pulmonary:     Effort: Pulmonary effort is normal.     Breath sounds: Normal breath sounds.  Musculoskeletal:        General: Normal range of motion.     Cervical back: Normal range of motion and neck supple.  Skin:    General: Skin is warm.  Neurological:     General: No focal deficit present.     Mental Status: He is alert and oriented to person, place, and time.  Psychiatric:        Attention and Perception: Attention normal.        Mood and Affect: Mood normal.        Speech: Speech normal.        Behavior: Behavior normal. Behavior is cooperative.        Thought Content: Thought content normal.        Cognition and Memory: Cognition normal.        Judgment: Judgment normal.      Assessment   1. Type 2 diabetes mellitus with hyperlipidemia ([place])   2. Spinal stenosis in cervical region   3. Primary hypertension   4. Spinal stenosis of lumbar region with neurogenic claudication   5. Other insomnia   6. Dizzy spells     Tests ordered Orders Placed This Encounter   Procedures  . Comprehensive metabolic panel  . Hemoglobin A1c     Plan: Please see assessment and plan per problem list above.   Meds ordered this encounter  Medications  . benazepril (LOTENSIN) 40 MG tablet    Sig: Take 1 tablet (40 mg total) by mouth daily.    Dispense:  90 tablet    Refill:  1    Order Specific Question:   Supervising Provider    Answer:   Jacklynn Bue    Patient to follow-up in 07/31/2020   Perlie Mayo, NP

## 2020-07-24 NOTE — Assessment & Plan Note (Signed)
He is on several medications, he is encouraged to continue these. He is followed by cardiology and sees them next week for follow up. He is elevated, but appears to be that way at most of his visit here. He is encouraged to avoid salt, eat a heart healthy diet and walk daily.

## 2020-07-24 NOTE — Assessment & Plan Note (Signed)
Reduced dose of gabapentin to see if dizziness improves and if he needs such a high dose. Reports tylenol has been helpful some with back.

## 2020-07-24 NOTE — Assessment & Plan Note (Signed)
Reduced dose of gabapentin to see if dizziness improves and if he needs such a high dose. Reports tylenol has been helpful some with back.  

## 2020-07-24 NOTE — Assessment & Plan Note (Signed)
Improved- sleeping much better these days per him. Uses Melatonin as needed.

## 2020-07-24 NOTE — Assessment & Plan Note (Signed)
He has had a extensive workup for dizziness that he reports he still does have. ENT and Cardiology cleared Had sx in Fall of 2021 did well with this. I think he would be better on a reduced dose or stopping neurontin. He agrees and we will talk by phone in 1 week to see how he is feeling.

## 2020-07-24 NOTE — Assessment & Plan Note (Signed)
Updated labs ordered Douglas Keller is encouraged to check blood sugar daily as directed. Continue current medications. Is on Repatha- statin intolerance Educated on importance of maintain a well balanced diabetic friendly diet.  He is reminded the importance of maintaining  good blood sugars,  taking medications as directed, daily foot care, annual eye exams. Additionally educated about keeping good control over blood pressure and cholesterol as well.

## 2020-07-24 NOTE — Patient Instructions (Signed)
  I appreciate the opportunity to provide you with care for your health and wellness.  Follow up: 1 week for phone to talk about how he did with med changes   Labs- today No Referrals today  Take gabapentin twice daily : once in the morning and once at night for 3 days then stop. We will see how you do at reduced dose and then off it before we refill it.  Please call eye dr and have them fax your report to [place].  Please continue to practice social distancing to keep you, your family, and our community safe.  If you must go out, please wear a mask and practice good handwashing.  It was a pleasure to see you and I look forward to continuing to work together on your health and well-being. Please do not hesitate to call the office if you need care or have questions about your care.  Have a wonderful day. With Gratitude, Tereasa Coop, DNP, AGNP-BC

## 2020-07-25 LAB — COMPREHENSIVE METABOLIC PANEL
ALT: 16 IU/L (ref 0–44)
AST: 23 IU/L (ref 0–40)
Albumin/Globulin Ratio: 1.4 (ref 1.2–2.2)
Albumin: 4.3 g/dL (ref 3.7–4.7)
Alkaline Phosphatase: 94 IU/L (ref 44–121)
BUN/Creatinine Ratio: 10 (ref 10–24)
BUN: 15 mg/dL (ref 8–27)
Bilirubin Total: 0.6 mg/dL (ref 0.0–1.2)
CO2: 21 mmol/L (ref 20–29)
Calcium: 9.6 mg/dL (ref 8.6–10.2)
Chloride: 102 mmol/L (ref 96–106)
Creatinine, Ser: 1.46 mg/dL — ABNORMAL HIGH (ref 0.76–1.27)
GFR calc Af Amer: 53 mL/min/{1.73_m2} — ABNORMAL LOW (ref >59)
GFR calc non Af Amer: 45 mL/min/{1.73_m2} — ABNORMAL LOW (ref >59)
Globulin, Total: 3 g/dL (ref 1.5–4.5)
Glucose: 125 mg/dL — ABNORMAL HIGH (ref 65–99)
Potassium: 4.6 mmol/L (ref 3.5–5.2)
Sodium: 140 mmol/L (ref 134–144)
Total Protein: 7.3 g/dL (ref 6.0–8.5)

## 2020-07-25 LAB — HEMOGLOBIN A1C
Est. average glucose Bld gHb Est-mCnc: 148 mg/dL
Hgb A1c MFr Bld: 6.8 % — ABNORMAL HIGH (ref 4.8–5.6)

## 2020-07-30 ENCOUNTER — Other Ambulatory Visit: Payer: Self-pay | Admitting: Family Medicine

## 2020-07-30 ENCOUNTER — Other Ambulatory Visit: Payer: Self-pay

## 2020-07-30 DIAGNOSIS — E785 Hyperlipidemia, unspecified: Secondary | ICD-10-CM

## 2020-07-30 DIAGNOSIS — E7801 Familial hypercholesterolemia: Secondary | ICD-10-CM

## 2020-07-30 DIAGNOSIS — E1169 Type 2 diabetes mellitus with other specified complication: Secondary | ICD-10-CM

## 2020-07-30 DIAGNOSIS — I10 Essential (primary) hypertension: Secondary | ICD-10-CM

## 2020-07-30 MED ORDER — METFORMIN HCL 500 MG PO TABS: 500.0000 mg | ORAL_TABLET | Freq: Every day | ORAL | 3 refills | Status: DC

## 2020-07-31 ENCOUNTER — Encounter: Payer: Self-pay | Admitting: Family Medicine

## 2020-07-31 ENCOUNTER — Other Ambulatory Visit: Payer: Self-pay

## 2020-07-31 ENCOUNTER — Telehealth (INDEPENDENT_AMBULATORY_CARE_PROVIDER_SITE_OTHER): Payer: Medicare HMO | Admitting: Family Medicine

## 2020-07-31 VITALS — Ht 66.0 in | Wt 205.0 lb

## 2020-07-31 DIAGNOSIS — R42 Dizziness and giddiness: Secondary | ICD-10-CM

## 2020-07-31 DIAGNOSIS — R944 Abnormal results of kidney function studies: Secondary | ICD-10-CM | POA: Diagnosis not present

## 2020-07-31 NOTE — Patient Instructions (Addendum)
  I appreciate the opportunity to provide you with care for your health and wellness.  Follow up: 3 months   Lab- CMP GFR in 3-4 weeks if not rechecked by cardiology No referrals today  Continue to stay off the gabapentin medication for now  Please continue to practice social distancing to keep you, your family, and our community safe.  If you must go out, please wear a mask and practice good handwashing.  It was a pleasure to see you and I look forward to continuing to work together on your health and well-being. Please do not hesitate to call the office if you need care or have questions about your care.  Have a wonderful day. With Gratitude, Cherly Beach, DNP, AGNP-BC

## 2020-07-31 NOTE — Assessment & Plan Note (Signed)
Improvement in dizziness off Neurontin.  we will continue off of this at this time.

## 2020-07-31 NOTE — Addendum Note (Signed)
Addended by: Laretta Bolster on: 07/31/2020 03:11 PM   Modules accepted: Orders

## 2020-07-31 NOTE — Assessment & Plan Note (Signed)
Changes w kidney function will get repeat lab work Considered changes fluid pill use if needed Has appt with cardiology tomorrow. Will see if they repeat labs if not we will and look at med changes if needed

## 2020-07-31 NOTE — Progress Notes (Signed)
Virtual Visit via Telephone Note   This visit type was conducted due to national recommendations for restrictions regarding the COVID-19 Pandemic (e.g. social distancing) in an effort to limit this patient's exposure and mitigate transmission in our community.  Due to his co-morbid illnesses, this patient is at least at moderate risk for complications without adequate follow up.  This format is felt to be most appropriate for this patient at this time.  The patient did not have access to video technology/had technical difficulties with video requiring transitioning to audio format only (telephone).  All issues noted in this document were discussed and addressed.  No physical exam could be performed with this format.    Evaluation Performed:  Follow-up visit  Date:  07/31/2020   ID:  Douglas Keller, DOB 09/30/1940, MRN [id number]  Patient Location: Home Provider Location: Office/Clinic   Participants: Nurse for intake and work up; Patient and Provider for Visit and Wrap up  Method of visit: Telephone  Location of Patient: Home Location of Provider: Office Consent was obtain for visit over the telephone. Services rendered by provider: Visit was performed via telephone  I verified that I am speaking with the correct person using two identifiers.  PCP:  Perlie Mayo, NP   Chief Complaint: Follow-up on medication change  History of Present Illness:    Douglas Keller is a 80 y.o. male with history as stated below. Reports today for follow-up on medication adjustment secondary to unknown etiology of dizzy spells.  He had had an extensive work-up with cardiology ENT unable to figure out what could be causing the dizziness.  Had an appointment in office on January 5.  After discussion and review of medications I have asked that he would like to try to stop or reduce the Neurontin medication which possibly could be causing the dizziness as he did not have it prior to starting the  medication once we talked through the medications.  He has now been off the medication for about 3 days prior that he did reduce doses he reports that he has not had any dizziness since stopping the medication.  He would like to remain off of it for this timeframe.  He denies having any neuropathy-like symptoms today on phone visit.  The patient does not have symptoms concerning for COVID-19 infection (fever, chills, cough, or new shortness of breath).   Past Medical, Surgical, Social History, Allergies, and Medications have been Reviewed.  Past Medical History:  Diagnosis Date  . Aortic valve sclerosis   . Atherosclerosis of coronary artery without angina pectoris 07/05/2014  . Cervical spondylosis without myelopathy 02/13/2013  . Chest pain 01/29/2019  . Degeneration of cervical intervertebral disc 02/13/2013  . Depression    sometimes  . Diabetes mellitus type 2, diet-controlled ([place])   . Elevated troponin   . GERD (gastroesophageal reflux disease)   . Heart murmur   . HTN (hypertension) 07/06/2013  . Hypercholesterolemia 08/21/2015  . Hyperlipidemia 07/06/2013  . Medication management 08/21/2015  . Pre-diabetes   . Rectal bleeding 09/21/2019  . Stiffness of left shoulder joint 07/19/2013   Past Surgical History:  Procedure Laterality Date  . BIOPSY  12/14/2019   Procedure: BIOPSY;  Surgeon: Daneil Dolin, MD;  [facility]: [facility];  Service: Endoscopy;;  . CERVICAL SPINE SURGERY    . COLONOSCOPY  07/26/2012   Procedure: COLONOSCOPY;  Surgeon: Jamesetta So, MD;  Location: [facility];  Service: Gastroenterology;  Laterality: N/A;  .  COLONOSCOPY WITH PROPOFOL N/A 12/14/2019   Procedure: COLONOSCOPY WITH PROPOFOL;  Surgeon: Daneil Dolin, MD;  Location: [facility];  Service: Endoscopy;  Laterality: N/A;  9:00am  . KNEE ARTHROSCOPY Left    torn ligament  . LUMBAR LAMINECTOMY/DECOMPRESSION MICRODISCECTOMY Bilateral 12/27/2019   Procedure: BIATERAL LUMBAR TWO- LUMBAR THREE, LUMBAR  THREE- LUMBAR FOUR, LUMBAR FOUR-FIVE LAMINOTOMY/FORAMINOTOMY 3 LEVELS;  Surgeon: Newman Pies, MD;  Location: [place];  Service: Neurosurgery;  Laterality: Bilateral;  POSTERIOR  . SPINE SURGERY N/A    [employer]reesia 02/24/2020     Current Meds  Medication Sig  . acetaminophen (TYLENOL) 650 MG CR tablet Take 1,300 mg by mouth every 8 (eight) hours as needed for pain.   Marland Kitchen amLODipine (NORVASC) 10 MG tablet Take 1 tablet (10 mg total) by mouth daily.  Marland Kitchen aspirin EC 81 MG tablet Take 81 mg by mouth daily.  . benazepril (LOTENSIN) 40 MG tablet Take 1 tablet (40 mg total) by mouth daily.  Marland Kitchen buPROPion (WELLBUTRIN XL) 150 MG 24 hr tablet Take 1 tablet (150 mg total) by mouth daily.  . furosemide (LASIX) 20 MG tablet Take 1 tablet (20 mg total) by mouth daily.  Marland Kitchen gabapentin (NEURONTIN) 300 MG capsule TAKE 1 CAPSULE THREE TIMES DAILY  . GLUCOSAMINE HCL PO Take 1 tablet by mouth at bedtime.  . magnesium oxide (MAG-OX) 400 MG tablet Take 400 mg by mouth daily.  . meclizine (ANTIVERT) 12.5 MG tablet Take 1 tablet (12.5 mg total) by mouth 2 (two) times daily as needed for dizziness.  . metFORMIN (GLUCOPHAGE) 500 MG tablet Take 1 tablet (500 mg total) by mouth daily with breakfast.  . multivitamin-iron-minerals-folic acid (CENTRUM) chewable tablet Chew 1 tablet by mouth daily.  . pantoprazole (PROTONIX) 40 MG tablet Take 1 tablet (40 mg total) by mouth daily.  . potassium chloride SA (KLOR-CON) 20 MEQ tablet Take 1 tablet (20 mEq total) by mouth daily.  Marland Kitchen REPATHA SURECLICK [id number] MG/ML SOAJ INJECT 140 MG INTO THE SKIN EVERY 14 DAY  . spironolactone-hydrochlorothiazide (ALDACTAZIDE) 25-25 MG tablet Take 1 tablet by mouth daily.     Allergies:   Statins   ROS:   Please see the history of present illness.    All other systems reviewed and are negative.   Labs/Other Tests and Data Reviewed:    Recent Labs: 09/08/2019: Magnesium 2.3 05/14/2020: Hemoglobin 14.1; NT-Pro BNP 25; Platelets 326; TSH  1.670 07/24/2020: ALT 16; BUN 15; Creatinine, Ser 1.46; Potassium 4.6; Sodium 140   Recent Lipid Panel Lab Results  Component Value Date/Time   CHOL 142 06/02/2019 08:14 AM   TRIG 112 06/02/2019 08:14 AM   HDL 55 06/02/2019 08:14 AM   CHOLHDL 2.6 06/02/2019 08:14 AM   CHOLHDL 3.3 01/29/2019 08:00 AM   LDLCALC 67 06/02/2019 08:14 AM    Wt Readings from Last 3 Encounters:  07/31/20 205 lb (93 kg)  07/24/20 208 lb (94.3 kg)  05/14/20 208 lb (94.3 kg)     Objective:    Vital Signs:  Ht 5\' 6"  (1.676 m)   Wt 205 lb (93 kg)   BMI 33.09 kg/m    VITAL SIGNS:  reviewed GEN:  no acute distress RESPIRATORY:  No shortness of breath in conversation PSYCH:  normal affect  ASSESSMENT & PLAN:    1. Dizzy spells  2. Decreased GFR   Time:   Today, I have spent 7 minutes with the patient with telehealth technology discussing the above problems.     Medication Adjustments/Labs  and Tests Ordered: Current medicines are reviewed at length with the patient today.  Concerns regarding medicines are outlined above.   Tests Ordered: No orders of the defined types were placed in this encounter.   Medication Changes: No orders of the defined types were placed in this encounter.    Disposition:  Follow up 3 months  Signed, Perlie Mayo, NP  07/31/2020 9:41 AM     [place]

## 2020-08-01 ENCOUNTER — Encounter: Payer: Self-pay | Admitting: Cardiovascular Disease

## 2020-08-01 ENCOUNTER — Ambulatory Visit: Payer: Medicare HMO | Admitting: Cardiovascular Disease

## 2020-08-01 ENCOUNTER — Other Ambulatory Visit: Payer: Self-pay

## 2020-08-01 VITALS — BP 134/74 | HR 62 | Ht 66.0 in | Wt 205.0 lb

## 2020-08-01 DIAGNOSIS — E669 Obesity, unspecified: Secondary | ICD-10-CM | POA: Diagnosis not present

## 2020-08-01 DIAGNOSIS — E7800 Pure hypercholesterolemia, unspecified: Secondary | ICD-10-CM

## 2020-08-01 DIAGNOSIS — I10 Essential (primary) hypertension: Secondary | ICD-10-CM

## 2020-08-01 DIAGNOSIS — M48062 Spinal stenosis, lumbar region with neurogenic claudication: Secondary | ICD-10-CM

## 2020-08-01 DIAGNOSIS — I2510 Atherosclerotic heart disease of native coronary artery without angina pectoris: Secondary | ICD-10-CM

## 2020-08-01 DIAGNOSIS — I5042 Chronic combined systolic (congestive) and diastolic (congestive) heart failure: Secondary | ICD-10-CM

## 2020-08-01 DIAGNOSIS — E1169 Type 2 diabetes mellitus with other specified complication: Secondary | ICD-10-CM

## 2020-08-01 DIAGNOSIS — E785 Hyperlipidemia, unspecified: Secondary | ICD-10-CM | POA: Diagnosis not present

## 2020-08-01 MED ORDER — POTASSIUM CHLORIDE CRYS ER 20 MEQ PO TBCR
EXTENDED_RELEASE_TABLET | ORAL | 0 refills | Status: DC
Start: 2020-08-01 — End: 2020-12-10

## 2020-08-01 MED ORDER — FUROSEMIDE 20 MG PO TABS
20.0000 mg | ORAL_TABLET | Freq: Every day | ORAL | 3 refills | Status: DC | PRN
Start: 2020-08-01 — End: 2020-12-10

## 2020-08-01 NOTE — Patient Instructions (Signed)
Medication Instructions:  CHANGE how you take the Furosemide to 20 mg as needed for shortness of breath and swelling.  CHANGE how you take the Potassium to 20 mEq as needed when you take the Furosemide.  *If you need a refill on your cardiac medications before your next appointment, please call your pharmacy*   Lab Work: Your provider would like for you to have the following labs today: Lipid  If you have labs (blood work) drawn today and your tests are completely normal, you will receive your results only by: Marland Kitchen MyChart Message (if you have MyChart) OR . A paper copy in the mail If you have any lab test that is abnormal or we need to change your treatment, we will call you to review the results.   Testing/Procedures: None ordered   Follow-Up: At [facility], you and your health needs are our priority.  As part of our continuing mission to provide you with exceptional heart care, we have created designated Provider Care Teams.  These Care Teams include your primary Cardiologist (physician) and Advanced Practice Providers (APPs -  Physician Assistants and Nurse Practitioners) who all work together to provide you with the care you need, when you need it.  We recommend signing up for the patient portal called "MyChart".  Sign up information is provided on this After Visit Summary.  MyChart is used to connect with patients for Virtual Visits (Telemedicine).  Patients are able to view lab/test results, encounter notes, upcoming appointments, etc.  Non-urgent messages can be sent to your provider as well.   To learn more about what you can do with MyChart, go to [email or web address].    Your next appointment:   12 month(s)  The format for your next appointment:   In Person  Provider:   You may see Sanda Klein, MD or one of the following Advanced Practice Providers on your designated Care Team:    Almyra Deforest, PA-C  Fabian Sharp, PA-C or   Roby Lofts, [place]

## 2020-08-01 NOTE — Progress Notes (Signed)
[name], male   DOB: 1940-07-24, 80 y.o.   MRN: [id number]    Cardiology Office Note    Date:  08/01/2020   ID:  Douglas Keller, DOB 10-27-1940, MRN [id number]  PCP:  Perlie Mayo, NP  Cardiologist:   Sanda Klein, MD   Chief Complaint  Patient presents with  . Congestive Heart Failure    History of Present Illness:  Douglas Keller is a 80 y.o. male with hyperlipidemia, hypertension, moderate coronary atherosclerosis and a murmur due to aortic valve sclerosis without stenosis.  Has had a couple of back surgeries and his level of activity has decreased substantially.  Longstanding problems with dizziness improved after he stopped gabapentin.  He has developed some pain in his right calf that is "always there" both at rest and with activity.  He does not have typical intermittent claudication.  He has had trouble maintaining a steady weight as he has become less active.  His blood pressure control has remained good.  Recent labs showed worsening renal function but his potassium level was normal.  Last October he developed some issues with exertional dyspnea, had a 9 pound weight gain in 3 weeks and was started on low-dose furosemide.  He denies any current issues with exertional dyspnea and has not had lower extremity edema.  He denies palpitations or syncope.  No focal neurological complaints.  He feels that his energy level has improved after starting B12 supplements.  He did not tolerate treatment with chlorthalidone due to severe muscle cramps, but is doing well on the spironolactone-hydrochlorothiazide combination.  Most recent potassium was 4.6 and creatinine was 1.46.  The patient specifically denies any chest pain at rest exertion, dyspnea at rest or with exertion, orthopnea, paroxysmal nocturnal dyspnea, syncope, palpitations, focal neurological deficits, intermittent claudication, lower extremity edema, unexplained weight gain, cough, hemoptysis or wheezing.  An  event monitor in November 2018 showed relative bradycardia and a suggestion of chronotropic incompetence.  He did not have any symptoms and a pacemaker was not necessary.  His echocardiogram in 2020 showed borderline LVEF of 45-50% (similar to EF 48% by nuclear stress test in 2016) and impaired relaxation  He was intolerant of multiple statins even when we tried to supplement coenzyme Q 10.  He is now on Repatha and tolerating it very well, with excellent LDL level.  Cardiac catheterization in 2006 showed  non obstructive atherosclerosis (40% proximal LAD, 40% mid RCA) and had nuclear stress test in 2018 showed normal perfusion, LVEF was only 48% .  Normal ABI bilaterally February 2020  Past Medical History:  Diagnosis Date  . Aortic valve sclerosis   . Atherosclerosis of coronary artery without angina pectoris 07/05/2014  . Cervical spondylosis without myelopathy 02/13/2013  . Chest pain 01/29/2019  . Degeneration of cervical intervertebral disc 02/13/2013  . Depression    sometimes  . Diabetes mellitus type 2, diet-controlled ([place])   . Elevated troponin   . GERD (gastroesophageal reflux disease)   . Heart murmur   . HTN (hypertension) 07/06/2013  . Hypercholesterolemia 08/21/2015  . Hyperlipidemia 07/06/2013  . Medication management 08/21/2015  . Pre-diabetes   . Rectal bleeding 09/21/2019  . Stiffness of left shoulder joint 07/19/2013    Past Surgical History:  Procedure Laterality Date  . BIOPSY  12/14/2019   Procedure: BIOPSY;  Surgeon: Daneil Dolin, MD;  [facility]: [facility];  Service: Endoscopy;;  . CERVICAL SPINE SURGERY    . COLONOSCOPY  07/26/2012   Procedure: COLONOSCOPY;  Surgeon: Jamesetta So, MD;  Location: [facility];  Service: Gastroenterology;  Laterality: N/A;  . COLONOSCOPY WITH PROPOFOL N/A 12/14/2019   Procedure: COLONOSCOPY WITH PROPOFOL;  Surgeon: Daneil Dolin, MD;  Location: [facility];  Service: Endoscopy;  Laterality: N/A;  9:00am  . KNEE ARTHROSCOPY  Left    torn ligament  . LUMBAR LAMINECTOMY/DECOMPRESSION MICRODISCECTOMY Bilateral 12/27/2019   Procedure: BIATERAL LUMBAR TWO- LUMBAR THREE, LUMBAR THREE- LUMBAR FOUR, LUMBAR FOUR-FIVE LAMINOTOMY/FORAMINOTOMY 3 LEVELS;  Surgeon: Newman Pies, MD;  Location: [facility];  Service: Neurosurgery;  Laterality: Bilateral;  POSTERIOR  . SPINE SURGERY N/A    Phreesia 02/24/2020    Outpatient Medications Prior to Visit  Medication Sig Dispense Refill  . acetaminophen (TYLENOL) 650 MG CR tablet Take 1,300 mg by mouth every 8 (eight) hours as needed for pain.     Marland Kitchen amLODipine (NORVASC) 10 MG tablet Take 1 tablet (10 mg total) by mouth daily. 90 tablet 0  . aspirin EC 81 MG tablet Take 81 mg by mouth daily.    . benazepril (LOTENSIN) 40 MG tablet Take 1 tablet (40 mg total) by mouth daily. 90 tablet 1  . buPROPion (WELLBUTRIN XL) 150 MG 24 hr tablet Take 1 tablet (150 mg total) by mouth daily. 90 tablet 0  . magnesium oxide (MAG-OX) 400 MG tablet Take 400 mg by mouth daily.    . meclizine (ANTIVERT) 12.5 MG tablet Take 1 tablet (12.5 mg total) by mouth 2 (two) times daily as needed for dizziness. 30 tablet 0  . metFORMIN (GLUCOPHAGE) 500 MG tablet Take 1 tablet (500 mg total) by mouth daily with breakfast. 90 tablet 3  . multivitamin-iron-minerals-folic acid (CENTRUM) chewable tablet Chew 1 tablet by mouth daily.    . pantoprazole (PROTONIX) 40 MG tablet Take 1 tablet (40 mg total) by mouth daily. 90 tablet 0  . REPATHA SURECLICK [id number] MG/ML SOAJ INJECT 140 MG INTO THE SKIN EVERY 14 DAY 2 mL 11  . spironolactone-hydrochlorothiazide (ALDACTAZIDE) 25-25 MG tablet Take 1 tablet by mouth daily.    . furosemide (LASIX) 20 MG tablet Take 1 tablet (20 mg total) by mouth daily. 90 tablet 3  . potassium chloride SA (KLOR-CON) 20 MEQ tablet Take 1 tablet (20 mEq total) by mouth daily. 90 tablet 0  . gabapentin (NEURONTIN) 300 MG capsule TAKE 1 CAPSULE THREE TIMES DAILY 90 capsule 0  . GLUCOSAMINE HCL PO Take 1  tablet by mouth at bedtime.     No facility-administered medications prior to visit.     Allergies:   Statins   Social History   Socioeconomic History  . Marital status: Widowed    Spouse name: Cherly   . Number of children: 1  . Years of education: Not on file  . Highest education level: 12th grade  Occupational History    Comment: Retired   Tobacco Use  . Smoking status: Never Smoker  . Smokeless tobacco: Never Used  Vaping Use  . Vaping Use: Never used  Substance and Sexual Activity  . Alcohol use: No  . Drug use: No  . Sexual activity: Not Currently  Other Topics Concern  . Not on file  Social History Narrative   Live alone right now, wife passed in 2009    [place] 2 live in [place]      Enjoys playing golf      Diet: eats what he likes, sweet, fast food, does not eat a lot of canned or boxed foods.  Does not eat enough fruits and veggies.       Caffeine: 1 cup daily   Water: 3-4 bottles of water daily, Gatorade every now and then         Wears seat belt   Wears sun protection when golfing   Smoke detectors at home   Does not use phone while driving   Social Determinants of Health   Financial Resource Strain: Low Risk   . Difficulty of Paying Living Expenses: Not hard at all  Food Insecurity: No Food Insecurity  . Worried About Charity fundraiser in the Last Year: Never true  . Ran Out of Food in the Last Year: Never true  Transportation Needs: No Transportation Needs  . Lack of Transportation (Medical): No  . Lack of Transportation (Non-Medical): No  Physical Activity: Insufficiently Active  . Days of Exercise per Week: 2 days  . Minutes of Exercise per Session: 10 min  Stress: No Stress Concern Present  . Feeling of Stress : Not at all  Social Connections: Moderately Isolated  . Frequency of Communication with Friends and Family: More than three times a week  . Frequency of Social Gatherings with Friends and Family: More  than three times a week  . Attends Religious Services: More than 4 times per year  . Active Member of Clubs or Organizations: No  . Attends Archivist Meetings: Never  . Marital Status: Widowed     Family History:  The patient's family history includes Asthma in his mother; Hypertension in his father.   ROS:   Please see the history of present illness.    All other systems are reviewed and are negative.  PHYSICAL EXAM:   VS:  BP 134/74   Pulse 62   Ht 5\' 6"  (1.676 m)   Wt 205 lb (93 kg)   SpO2 96%   BMI 33.09 kg/m      General: Alert, oriented x3, no distress, mildly obese Head: no evidence of trauma, PERRL, EOMI, no exophtalmos or lid lag, no myxedema, no xanthelasma; normal ears, nose and oropharynx Neck: normal jugular venous pulsations and no hepatojugular reflux; brisk carotid pulses without delay and no carotid bruits Chest: clear to auscultation, no signs of consolidation by percussion or palpation, normal fremitus, symmetrical and full respiratory excursions Cardiovascular: normal position and quality of the apical impulse, regular rhythm, normal first and second heart sounds, 1-2/6 aortic ejection murmur is early peaking no diastolic murmurs, rubs or gallops Abdomen: no tenderness or distention, no masses by palpation, no abnormal pulsatility or arterial bruits, normal bowel sounds, no hepatosplenomegaly Extremities: no clubbing, cyanosis or edema; 2+ radial, ulnar and brachial pulses bilaterally; 2+ right femoral, posterior tibial and dorsalis pedis pulses; 2+ left femoral, posterior tibial and dorsalis pedis pulses; no subclavian or femoral bruits Neurological: grossly nonfocal Psych: Normal mood and affect   Wt Readings from Last 3 Encounters:  08/01/20 205 lb (93 kg)  07/31/20 205 lb (93 kg)  07/24/20 208 lb (94.3 kg)      Studies/Labs Reviewed:   EKG:  EKG is not ordered today.  Reviewed the tracing from 05/14/2020 showing sinus rhythm, slightly  delayed R wave progression, mild T wave flattening. Recent Labs: 09/08/2019: Magnesium 2.3 05/14/2020: Hemoglobin 14.1; NT-Pro BNP 25; Platelets 326; TSH 1.670 07/24/2020: ALT 16; BUN 15; Creatinine, Ser 1.46; Potassium 4.6; Sodium 140  BMET    Component Value Date/Time   NA 140 07/24/2020 0913   K 4.6 07/24/2020 0913  CL 102 07/24/2020 0913   CO2 21 07/24/2020 0913   GLUCOSE 125 (H) 07/24/2020 0913   GLUCOSE 151 (H) 12/12/2019 1014   BUN 15 07/24/2020 0913   CREATININE 1.46 (H) 07/24/2020 0913   CREATININE 1.13 06/28/2019 0830   CALCIUM 9.6 07/24/2020 0913   GFRNONAA 45 (L) 07/24/2020 0913   GFRNONAA 62 06/28/2019 0830   GFRAA 53 (L) 07/24/2020 0913   GFRAA 72 06/28/2019 0830    Lipid Panel    Component Value Date/Time   CHOL 142 06/02/2019 0814   TRIG 112 06/02/2019 0814   HDL 55 06/02/2019 0814   CHOLHDL 2.6 06/02/2019 0814   CHOLHDL 3.3 01/29/2019 0800   VLDL 30 01/29/2019 0800   LDLCALC 67 06/02/2019 0814     ASSESSMENT:    1. Chronic combined systolic and diastolic heart failure ([place])   2. Essential hypertension   3. Pure hypercholesterolemia   4. Coronary artery disease involving native coronary artery of native heart without angina pectoris   5. Diabetes mellitus type 2 in obese ([place])   6. Spinal stenosis of lumbar region with neurogenic claudication      PLAN:  In order of problems listed above:  1. CHF: Appears clinically euvolemic and his labs suggest that we may be overdoing it with the diuretics.  We will stop the furosemide and the potassium supplement, but he can continue to use these as needed if he notices abrupt weight gain (more than 3 pounds overnight or 5 pounds in a week, especially if associated with dyspnea or lower extremity edema).Marland Kitchen  He is scheduled for repeat metabolic panel in about 3 weeks.  He has mildly depressed left ventricular systolic function and diastolic dysfunction. 2. HTN: Well-controlled on 4 different agents.  Avoid  chlorthalidone due to severe muscle cramps.  Avoid beta-blockers due to symptomatic chronotropic incompetence. 3. 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  Excellent response to Repatha.  Time to repeat his lipid profile which we will check today.  4. CAD: Denies angina pectoris, but his level of activity has declined because of his back problems.  Asymptomatic, nonobstructive but widespread atherosclerotic lesions at the time of previous coronary angiography. 5. DM/obesity: Glycemic control has deteriorated slightly and his most recent hemoglobin A1c was 6.8 %.  This is probably due to reduced levels of physical activity since he is limited by his lumbar spine problems. 6. Bilateral leg pain: His right calf pain is continuous and not associated with activity, not consistent with a vascular cause.  Has lumbar spine disease and neurogenic claudication.  He had normal ABI , February 2020. 7. Abnormal creatinine: Strongly suspect this is due to overdiuresis.  We will stop the furosemide and potassium supplement.  Scheduled for repeat labs in 3 weeks.   Medication Adjustments/Labs and Tests Ordered: Current medicines are reviewed at length with the patient today.  Concerns regarding medicines are outlined above.  Medication changes, Labs and Tests ordered today are listed in the Patient Instructions below. Patient Instructions  Medication Instructions:  CHANGE how you take the Furosemide to 20 mg as needed for shortness of breath and swelling.  CHANGE how you take the Potassium to 20 mEq as needed when you take the Furosemide.  *If you need a refill on your cardiac medications before your next appointment, please call your pharmacy*   Lab Work: Your provider would like for you to have the following labs today: Lipid  If you have labs (blood work) drawn today and your tests are  completely normal, you will receive your results only by: Marland Kitchen MyChart Message (if you have MyChart)  OR . A paper copy in the mail If you have any lab test that is abnormal or we need to change your treatment, we will call you to review the results.   Testing/Procedures: None ordered   Follow-Up: At [facility], you and your health needs are our priority.  As part of our continuing mission to provide you with exceptional heart care, we have created designated Provider Care Teams.  These Care Teams include your primary Cardiologist (physician) and Advanced Practice Providers (APPs -  Physician Assistants and Nurse Practitioners) who all work together to provide you with the care you need, when you need it.  We recommend signing up for the patient portal called "MyChart".  Sign up information is provided on this After Visit Summary.  MyChart is used to connect with patients for Virtual Visits (Telemedicine).  Patients are able to view lab/test results, encounter notes, upcoming appointments, etc.  Non-urgent messages can be sent to your provider as well.   To learn more about what you can do with MyChart, go to [email or web address].    Your next appointment:   12 month(s)  The format for your next appointment:   In Person  Provider:   You may see Sanda Klein, MD or one of the following Advanced Practice Providers on your designated Care Team:    Almyra Deforest, PA-C  Fabian Sharp, [place] or   Roby Lofts, PA-C        Signed, Sanda Klein, MD  08/01/2020 8:57 AM    [facility] [address], [place], [place]  [address] Phone: [phone]; Fax: [phone]

## 2020-08-02 ENCOUNTER — Encounter: Payer: Self-pay | Admitting: *Deleted

## 2020-08-02 LAB — LIPID PANEL
Chol/HDL Ratio: 2.9 ratio (ref 0.0–5.0)
Cholesterol, Total: 118 mg/dL (ref 100–199)
HDL: 41 mg/dL (ref >39)
LDL Chol Calc (NIH): 49 mg/dL (ref 0–99)
Triglycerides: 165 mg/dL — ABNORMAL HIGH (ref 0–149)
VLDL Cholesterol Cal: 28 mg/dL (ref 5–40)

## 2020-08-08 ENCOUNTER — Other Ambulatory Visit: Payer: Self-pay | Admitting: Family Medicine

## 2020-08-08 ENCOUNTER — Telehealth: Payer: Self-pay

## 2020-08-08 DIAGNOSIS — M48062 Spinal stenosis, lumbar region with neurogenic claudication: Secondary | ICD-10-CM

## 2020-08-08 DIAGNOSIS — M4802 Spinal stenosis, cervical region: Secondary | ICD-10-CM

## 2020-08-08 MED ORDER — GABAPENTIN 300 MG PO CAPS: 300.0000 mg | ORAL_CAPSULE | Freq: Every day | ORAL | 0 refills | Status: DC

## 2020-08-08 NOTE — Telephone Encounter (Signed)
Pt informed. He had stopped his Gabapentin over a week ago but he feels like he needs to start it back. Wants to know if you will send this in for him. Please advise.

## 2020-08-08 NOTE — Telephone Encounter (Signed)
Pt says he is having both the leg cramps and the pain again. He says he felt better on it.

## 2020-08-08 NOTE — Telephone Encounter (Signed)
Ok, ill start him back at once daily at night. He complained of dizziness when on the medication, so there is a chance it could return.

## 2020-08-08 NOTE — Telephone Encounter (Signed)
Patient calling he states the metformin he was given is causing bad muscle cramps and is wanting advice p# [phone]

## 2020-08-08 NOTE — Telephone Encounter (Signed)
Why does he feel he needs to start it back? Due to the leg cramps? Or pain?

## 2020-08-08 NOTE — Telephone Encounter (Signed)
He should increase his water intake. He is on low dose and this is not an usual side effect for most. If it continues for greater than 4 weeks we will review. Most side effects will subside in 2-4 weeks of use.  Make sure he is taking it with a meal.

## 2020-08-08 NOTE — Telephone Encounter (Signed)
Pt informed

## 2020-08-11 ENCOUNTER — Other Ambulatory Visit: Payer: Self-pay | Admitting: Family Medicine

## 2020-08-11 ENCOUNTER — Other Ambulatory Visit: Payer: Self-pay | Admitting: Cardiovascular Disease

## 2020-08-11 DIAGNOSIS — F321 Major depressive disorder, single episode, moderate: Secondary | ICD-10-CM

## 2020-09-24 ENCOUNTER — Other Ambulatory Visit: Payer: Self-pay | Admitting: Family Medicine

## 2020-09-24 DIAGNOSIS — F321 Major depressive disorder, single episode, moderate: Secondary | ICD-10-CM

## 2020-09-24 DIAGNOSIS — M48062 Spinal stenosis, lumbar region with neurogenic claudication: Secondary | ICD-10-CM

## 2020-10-23 ENCOUNTER — Other Ambulatory Visit: Payer: Self-pay

## 2020-10-23 ENCOUNTER — Telehealth: Payer: Self-pay

## 2020-10-23 MED ORDER — SPIRONOLACTONE-HCTZ 25-25 MG PO TABS
1.0000 | ORAL_TABLET | Freq: Every day | ORAL | 0 refills | Status: DC
Start: 2020-10-23 — End: 2020-10-30

## 2020-10-23 NOTE — Telephone Encounter (Signed)
Rx sent in

## 2020-10-23 NOTE — Telephone Encounter (Signed)
  Patient came in office need med refill. Said he only needs 30 pills  spironolactone-hydrochlorothiazide (ALDACTAZIDE) 25-25 MG tablet   Pharmacy [facility]

## 2020-10-30 ENCOUNTER — Other Ambulatory Visit: Payer: Self-pay

## 2020-10-30 ENCOUNTER — Encounter: Payer: Self-pay | Admitting: Nurse Practitioner

## 2020-10-30 ENCOUNTER — Ambulatory Visit (INDEPENDENT_AMBULATORY_CARE_PROVIDER_SITE_OTHER): Payer: Medicare HMO | Admitting: Nurse Practitioner

## 2020-10-30 ENCOUNTER — Telehealth: Payer: Self-pay

## 2020-10-30 VITALS — BP 152/79 | HR 70 | Temp 99.1°F | Resp 20 | Ht 66.0 in | Wt 205.0 lb

## 2020-10-30 DIAGNOSIS — G8929 Other chronic pain: Secondary | ICD-10-CM

## 2020-10-30 DIAGNOSIS — E785 Hyperlipidemia, unspecified: Secondary | ICD-10-CM | POA: Diagnosis not present

## 2020-10-30 DIAGNOSIS — M25562 Pain in left knee: Secondary | ICD-10-CM

## 2020-10-30 DIAGNOSIS — R42 Dizziness and giddiness: Secondary | ICD-10-CM | POA: Diagnosis not present

## 2020-10-30 DIAGNOSIS — M48062 Spinal stenosis, lumbar region with neurogenic claudication: Secondary | ICD-10-CM

## 2020-10-30 DIAGNOSIS — F321 Major depressive disorder, single episode, moderate: Secondary | ICD-10-CM

## 2020-10-30 DIAGNOSIS — M4802 Spinal stenosis, cervical region: Secondary | ICD-10-CM

## 2020-10-30 DIAGNOSIS — I10 Essential (primary) hypertension: Secondary | ICD-10-CM

## 2020-10-30 DIAGNOSIS — E1169 Type 2 diabetes mellitus with other specified complication: Secondary | ICD-10-CM | POA: Diagnosis not present

## 2020-10-30 DIAGNOSIS — K219 Gastro-esophageal reflux disease without esophagitis: Secondary | ICD-10-CM

## 2020-10-30 MED ORDER — BUPROPION HCL ER (XL) 150 MG PO TB24: 1.0000 | ORAL_TABLET | Freq: Every day | ORAL | 2 refills | Status: DC

## 2020-10-30 MED ORDER — BENAZEPRIL HCL 40 MG PO TABS: 40.0000 mg | ORAL_TABLET | Freq: Every day | ORAL | 1 refills | Status: DC

## 2020-10-30 MED ORDER — PANTOPRAZOLE SODIUM 40 MG PO TBEC: 1.0000 | DELAYED_RELEASE_TABLET | Freq: Every day | ORAL | 2 refills | Status: DC

## 2020-10-30 MED ORDER — SPIRONOLACTONE-HCTZ 25-25 MG PO TABS: 1.0000 | ORAL_TABLET | Freq: Every day | ORAL | 0 refills | Status: DC

## 2020-10-30 MED ORDER — MECLIZINE HCL 12.5 MG PO TABS: 12.5000 mg | ORAL_TABLET | Freq: Two times a day (BID) | ORAL | 0 refills | Status: DC | PRN

## 2020-10-30 MED ORDER — AMLODIPINE BESYLATE 10 MG PO TABS: 1.0000 | ORAL_TABLET | Freq: Every day | ORAL | 1 refills | Status: DC

## 2020-10-30 MED ORDER — GABAPENTIN 300 MG PO CAPS: ORAL_CAPSULE | ORAL | 0 refills | Status: DC

## 2020-10-30 NOTE — Telephone Encounter (Signed)
Patient needs med refills says 90 day supply each meds. Patient said could not remember letting Legrand Como know which meds.  amLODipine (NORVASC) 10 MG tablet   benazepril (LOTENSIN) 40 MG tablet  buPROPion (WELLBUTRIN XL) 150 MG 24 hr tablet   gabapentin (NEURONTIN) 300 MG capsule  meclizine (ANTIVERT) 12.5 MG tablet  pantoprazole (PROTONIX) 40 MG tablet   spironolactone-hydrochlorothiazide (ALDACTAZIDE) 25-25 MG tablet  Pharmacy: [facility]

## 2020-10-30 NOTE — Progress Notes (Signed)
Acute Office Visit  Subjective:    [name], male    DOB: [birth year], 80 y.o.   MRN: [id number]  Chief Complaint  Patient presents with  . Hypertension    Follow up. Needs lab work.     HPI Patient is in today for BP check and f/u for chronic conditions.  He was started on metformin for DM, but he had GI distress and diarrhea, so he stopped this.  He has L>R knee pain that is chronic in nature.  He states that he get dizzy more than once per day, and this gets better with urination. He gets a tingling up the back of his neck prior to his dizzy spells.  He describes seizure-like activity that happened several years ago, but I see no neurology work-up in the past.  Past Medical History:  Diagnosis Date  . Aortic valve sclerosis   . Atherosclerosis of coronary artery without angina pectoris 07/05/2014  . Cervical spondylosis without myelopathy 02/13/2013  . Chest pain 01/29/2019  . Degeneration of cervical intervertebral disc 02/13/2013  . Depression    sometimes  . Diabetes mellitus type 2, diet-controlled ([place])   . Elevated troponin   . GERD (gastroesophageal reflux disease)   . Heart murmur   . HTN (hypertension) 07/06/2013  . Hypercholesterolemia 08/21/2015  . Hyperlipidemia 07/06/2013  . Medication management 08/21/2015  . Pre-diabetes   . Rectal bleeding 09/21/2019  . Stiffness of left shoulder joint 07/19/2013    Past Surgical History:  Procedure Laterality Date  . BIOPSY  12/14/2019   Procedure: BIOPSY;  Surgeon: Daneil Dolin, MD;  [facility];  Service: Endoscopy;;  . CERVICAL SPINE SURGERY    . COLONOSCOPY  07/26/2012   Procedure: COLONOSCOPY;  Surgeon: Jamesetta So, MD;  [facility];  Service: Gastroenterology;  Laterality: N/A;  . COLONOSCOPY WITH PROPOFOL N/A 12/14/2019   Procedure: COLONOSCOPY WITH PROPOFOL;  Surgeon: Daneil Dolin, MD;  [facility];  Service: Endoscopy;  Laterality: N/A;  9:00am  . KNEE  ARTHROSCOPY Left    torn ligament  . LUMBAR LAMINECTOMY/DECOMPRESSION MICRODISCECTOMY Bilateral 12/27/2019   Procedure: BIATERAL LUMBAR TWO- LUMBAR THREE, LUMBAR THREE- LUMBAR FOUR, LUMBAR FOUR-FIVE LAMINOTOMY/FORAMINOTOMY 3 LEVELS;  Surgeon: Newman Pies, MD;  Location: [place];  Service: Neurosurgery;  Laterality: Bilateral;  POSTERIOR  . SPINE SURGERY N/A    Phreesia 02/24/2020    Family History  Problem Relation Age of Onset  . Asthma Mother   . Hypertension Father   . Colon cancer Neg Hx     Social History   Socioeconomic History  . Marital status: Widowed    Spouse name: Cherly   . Number of children: 1  . Years of education: Not on file  . Highest education level: 12th grade  Occupational History    Comment: Retired   Tobacco Use  . Smoking status: Never Smoker  . Smokeless tobacco: Never Used  Vaping Use  . Vaping Use: Never used  Substance and Sexual Activity  . Alcohol use: No  . Drug use: No  . Sexual activity: Not Currently  Other Topics Concern  . Not on file  Social History Narrative   Live alone right now, wife passed in 2009    [place] [address] live in [place]      Enjoys playing golf      Diet: eats what he likes, sweet, fast food, does not eat a lot of canned or boxed  foods.    Does not eat enough fruits and veggies.       Caffeine: 1 cup daily   Water: 3-4 bottles of water daily, Gatorade every now and then         Wears seat belt   Wears sun protection when golfing   Smoke detectors at home   Does not use phone while driving   Social Determinants of Health   Financial Resource Strain: Low Risk   . Difficulty of Paying Living Expenses: Not hard at all  Food Insecurity: No Food Insecurity  . Worried About Running Out of Food in the Last Year: Never true  . Ran Out of Food in the Last Year: Never true  Transportation Needs: No Transportation Needs  . Lack of Transportation (Medical): No  . Lack of Transportation  (Non-Medical): No  Physical Activity: Insufficiently Active  . Days of Exercise per Week: 2 days  . Minutes of Exercise per Session: 10 min  Stress: No Stress Concern Present  . Feeling of Stress : Not at all  Social Connections: Moderately Isolated  . Frequency of Communication with Friends and Family: More than three times a week  . Frequency of Social Gatherings with Friends and Family: More than three times a week  . Attends Religious Services: More than 4 times per year  . Active Member of Clubs or Organizations: No  . Attends Club or Organization Meetings: Never  . Marital Status: Widowed  Intimate Partner Violence: Not on file    Outpatient Medications Prior to Visit  Medication Sig Dispense Refill  . acetaminophen (TYLENOL) 650 MG CR tablet Take 1,300 mg by mouth every 8 (eight) hours as needed for pain.     . amLODipine (NORVASC) 10 MG tablet TAKE 1 TABLET EVERY DAY 90 tablet 0  . aspirin EC 81 MG tablet Take 81 mg by mouth daily.    . benazepril (LOTENSIN) 40 MG tablet Take 1 tablet (40 mg total) by mouth daily. 90 tablet 1  . buPROPion (WELLBUTRIN XL) 150 MG 24 hr tablet TAKE 1 TABLET EVERY DAY 90 tablet 0  . furosemide (LASIX) 20 MG tablet Take 1 tablet (20 mg total) by mouth daily as needed for edema. 30 tablet 3  . gabapentin (NEURONTIN) 300 MG capsule TAKE 1 CAPSULE THREE TIMES DAILY 90 capsule 0  . magnesium oxide (MAG-OX) 400 MG tablet Take 400 mg by mouth daily.    . meclizine (ANTIVERT) 12.5 MG tablet Take 1 tablet (12.5 mg total) by mouth 2 (two) times daily as needed for dizziness. 30 tablet 0  . multivitamin-iron-minerals-folic acid (CENTRUM) chewable tablet Chew 1 tablet by mouth daily.    . pantoprazole (PROTONIX) 40 MG tablet TAKE 1 TABLET EVERY DAY 90 tablet 0  . potassium chloride SA (KLOR-CON) 20 MEQ tablet Take one tablet as needed when you need to take the Furosemide 30 tablet 0  . REPATHA SURECLICK 140 MG/ML SOAJ INJECT 140 MG INTO THE SKIN EVERY 14 DAY 2  mL 11  . spironolactone-hydrochlorothiazide (ALDACTAZIDE) 25-25 MG tablet Take 1 tablet by mouth daily. 30 tablet 0  . metFORMIN (GLUCOPHAGE) 500 MG tablet Take 1 tablet (500 mg total) by mouth daily with breakfast. 90 tablet 3   No facility-administered medications prior to visit.    Allergies  Allergen Reactions  . Metformin And Related Diarrhea and Nausea And Vomiting  . Statins Other (See Comments)    Muscle/joint pain     Review of Systems  Constitutional: Negative.     Respiratory: Negative.   Musculoskeletal: Positive for arthralgias.  Neurological: Positive for dizziness.       Objective:    Physical Exam Constitutional:      Appearance: Normal appearance.  Cardiovascular:     Rate and Rhythm: Normal rate and regular rhythm.     Pulses: Normal pulses.     Heart sounds: Normal heart sounds.  Pulmonary:     Effort: Pulmonary effort is normal.     Breath sounds: Normal breath sounds.  Neurological:     General: No focal deficit present.     Mental Status: He is alert and oriented to person, place, and time.     Cranial Nerves: No cranial nerve deficit.     Motor: No weakness.     Coordination: Coordination normal.     BP (!) 152/79   Pulse 70   Temp 99.1 F (37.3 C)   Resp 20   Ht 5' 6" (1.676 m)   Wt 205 lb (93 kg)   SpO2 93%   BMI 33.09 kg/m  Wt Readings from Last 3 Encounters:  10/30/20 205 lb (93 kg)  08/01/20 205 lb (93 kg)  07/31/20 205 lb (93 kg)    Health Maintenance Due  Topic Date Due  . TETANUS/TDAP  Never done  . OPHTHALMOLOGY EXAM  03/05/2020  . FOOT EXAM  05/10/2020    There are no preventive care reminders to display for this patient.   Lab Results  Component Value Date   TSH 1.670 05/14/2020   Lab Results  Component Value Date   WBC 8.0 05/14/2020   HGB 14.1 05/14/2020   HCT 41.9 05/14/2020   MCV 89 05/14/2020   PLT 326 05/14/2020   Lab Results  Component Value Date   NA 140 07/24/2020   K 4.6 07/24/2020   CO2 21  07/24/2020   GLUCOSE 125 (H) 07/24/2020   BUN 15 07/24/2020   CREATININE 1.46 (H) 07/24/2020   BILITOT 0.6 07/24/2020   ALKPHOS 94 07/24/2020   AST 23 07/24/2020   ALT 16 07/24/2020   PROT 7.3 07/24/2020   ALBUMIN 4.3 07/24/2020   CALCIUM 9.6 07/24/2020   ANIONGAP 13 12/12/2019   Lab Results  Component Value Date   CHOL 118 08/01/2020   Lab Results  Component Value Date   HDL 41 08/01/2020   Lab Results  Component Value Date   LDLCALC 49 08/01/2020   Lab Results  Component Value Date   TRIG 165 (H) 08/01/2020   Lab Results  Component Value Date   CHOLHDL 2.9 08/01/2020   Lab Results  Component Value Date   HGBA1C 6.8 (H) 07/24/2020       Assessment & Plan:   Problem List Items Addressed This Visit      Cardiovascular and Mediastinum   HTN (hypertension)    -BP slightly elevated today -taking amlodipine, benazepril, lasix, and spironolactone-HCTZ        Endocrine   Type 2 diabetes mellitus with hyperlipidemia (HCC) - Primary    -will check A1c -on repatha and ACEi -she stopped metformin d/t diarrhea      Relevant Orders   CBC with Differential/Platelet   CMP14+EGFR   Hemoglobin A1c   Lipid Panel With LDL/HDL Ratio   Microalbumin / creatinine urine ratio     Other   Dizzy spells    -referral to neuro -described prior seizure-like activity; would like to r/o seizure      Relevant Orders   Ambulatory referral to Neurology     Left knee pain    -referral to ortho -he would like to discuss non-surgical options for his chronic knee pain      Relevant Orders   Ambulatory referral to Orthopedic Surgery       No orders of the defined types were placed in this encounter.    JOSEPH M GRAY, NP 

## 2020-10-30 NOTE — Assessment & Plan Note (Signed)
-  will check A1c -on repatha and ACEi -she stopped metformin d/t diarrhea

## 2020-10-30 NOTE — Assessment & Plan Note (Signed)
-  BP slightly elevated today -taking amlodipine, benazepril, lasix, and spironolactone-HCTZ

## 2020-10-30 NOTE — Patient Instructions (Signed)
Please have fasting labs drawn this week. 

## 2020-10-30 NOTE — Assessment & Plan Note (Signed)
-  referral to neuro -described prior seizure-like activity; would like to r/o seizure

## 2020-10-30 NOTE — Telephone Encounter (Signed)
Rx sent in

## 2020-10-30 NOTE — Assessment & Plan Note (Signed)
-  referral to ortho -he would like to discuss non-surgical options for his chronic knee pain

## 2020-10-30 NOTE — Telephone Encounter (Signed)
error 

## 2020-10-31 DIAGNOSIS — E785 Hyperlipidemia, unspecified: Secondary | ICD-10-CM | POA: Diagnosis not present

## 2020-10-31 DIAGNOSIS — E1169 Type 2 diabetes mellitus with other specified complication: Secondary | ICD-10-CM | POA: Diagnosis not present

## 2020-11-01 LAB — CMP14+EGFR
ALT: 15 IU/L (ref 0–44)
AST: 22 IU/L (ref 0–40)
Albumin/Globulin Ratio: 1.6 (ref 1.2–2.2)
Albumin: 4.2 g/dL (ref 3.7–4.7)
Alkaline Phosphatase: 88 IU/L (ref 44–121)
BUN/Creatinine Ratio: 9 — ABNORMAL LOW (ref 10–24)
BUN: 11 mg/dL (ref 8–27)
Bilirubin Total: 0.6 mg/dL (ref 0.0–1.2)
CO2: 20 mmol/L (ref 20–29)
Calcium: 9 mg/dL (ref 8.6–10.2)
Chloride: 104 mmol/L (ref 96–106)
Creatinine, Ser: 1.26 mg/dL (ref 0.76–1.27)
Globulin, Total: 2.7 g/dL (ref 1.5–4.5)
Glucose: 117 mg/dL — ABNORMAL HIGH (ref 65–99)
Potassium: 4.1 mmol/L (ref 3.5–5.2)
Sodium: 142 mmol/L (ref 134–144)
Total Protein: 6.9 g/dL (ref 6.0–8.5)
eGFR: 58 mL/min/{1.73_m2} — ABNORMAL LOW (ref >59)

## 2020-11-01 LAB — HEMOGLOBIN A1C
Est. average glucose Bld gHb Est-mCnc: 137 mg/dL
Hgb A1c MFr Bld: 6.4 % — ABNORMAL HIGH (ref 4.8–5.6)

## 2020-11-01 LAB — CBC WITH DIFFERENTIAL/PLATELET
Basophils Absolute: 0.1 10*3/uL (ref 0.0–0.2)
Basos: 1 %
EOS (ABSOLUTE): 0.3 10*3/uL (ref 0.0–0.4)
Eos: 3 %
Hematocrit: 41.4 % (ref 37.5–51.0)
Hemoglobin: 14.1 g/dL (ref 13.0–17.7)
Immature Grans (Abs): 0 10*3/uL (ref 0.0–0.1)
Immature Granulocytes: 0 %
Lymphocytes Absolute: 2.4 10*3/uL (ref 0.7–3.1)
Lymphs: 29 %
MCH: 30.8 pg (ref 26.6–33.0)
MCHC: 34.1 g/dL (ref 31.5–35.7)
MCV: 90 fL (ref 79–97)
Monocytes Absolute: 0.7 10*3/uL (ref 0.1–0.9)
Monocytes: 8 %
Neutrophils Absolute: 4.8 10*3/uL (ref 1.4–7.0)
Neutrophils: 59 %
Platelets: 337 10*3/uL (ref 150–450)
RBC: 4.58 x10E6/uL (ref 4.14–5.80)
RDW: 12.6 % (ref 11.6–15.4)
WBC: 8.2 10*3/uL (ref 3.4–10.8)

## 2020-11-01 LAB — LIPID PANEL WITH LDL/HDL RATIO
Cholesterol, Total: 122 mg/dL (ref 100–199)
HDL: 45 mg/dL (ref >39)
LDL Chol Calc (NIH): 54 mg/dL (ref 0–99)
LDL/HDL Ratio: 1.2 ratio (ref 0.0–3.6)
Triglycerides: 131 mg/dL (ref 0–149)
VLDL Cholesterol Cal: 23 mg/dL (ref 5–40)

## 2020-11-01 LAB — MICROALBUMIN / CREATININE URINE RATIO

## 2020-11-01 NOTE — Progress Notes (Signed)
A1c is at goal at 6.4. Kidney function is right at the borderline, so increase water intake.

## 2020-11-25 ENCOUNTER — Encounter: Payer: Self-pay | Admitting: Orthopedic Surgery

## 2020-11-25 ENCOUNTER — Other Ambulatory Visit: Payer: Self-pay

## 2020-11-25 ENCOUNTER — Ambulatory Visit: Payer: Medicare HMO | Admitting: Orthopedic Surgery

## 2020-11-25 ENCOUNTER — Ambulatory Visit: Payer: Medicare HMO

## 2020-11-25 VITALS — BP 159/91 | HR 77 | Ht 66.0 in | Wt 211.0 lb

## 2020-11-25 DIAGNOSIS — M170 Bilateral primary osteoarthritis of knee: Secondary | ICD-10-CM

## 2020-11-25 DIAGNOSIS — G8929 Other chronic pain: Secondary | ICD-10-CM

## 2020-11-25 DIAGNOSIS — M25562 Pain in left knee: Secondary | ICD-10-CM

## 2020-11-25 MED ORDER — MELOXICAM 7.5 MG PO TABS: 7.5000 mg | ORAL_TABLET | Freq: Every day | ORAL | 0 refills | Status: DC

## 2020-11-25 NOTE — Progress Notes (Signed)
d 

## 2020-11-25 NOTE — Progress Notes (Signed)
NEW PROBLEM//OFFICE VISIT  Summary assessment and plan: 80 year old male pain both knees only for few months but no prior treatment  has moderate to severe arthritis  Meds ordered this encounter  Medications  . meloxicam (MOBIC) 7.5 MG tablet    Sig: Take 1 tablet (7.5 mg total) by mouth daily.    Dispense:  30 tablet    Refill:  0     Chief Complaint  Patient presents with  . Knee Pain    Bilateral left worse than right    80 year old male 58-month history of pain in his knees.  He said he was going to the [facility] with no problems up until 2 months ago and then started having pain that prevented him from exercising.  He has had no treatment most of his pain is on the medial side   Review of Systems  Musculoskeletal: Positive for back pain.  Neurological: Positive for tingling.  All other systems reviewed and are negative.    Past Medical History:  Diagnosis Date  . Aortic valve sclerosis   . Atherosclerosis of coronary artery without angina pectoris 07/05/2014  . Cervical spondylosis without myelopathy 02/13/2013  . Chest pain 01/29/2019  . Degeneration of cervical intervertebral disc 02/13/2013  . Depression    sometimes  . Diabetes mellitus type 2, diet-controlled ([place])   . Elevated troponin   . GERD (gastroesophageal reflux disease)   . Heart murmur   . HTN (hypertension) 07/06/2013  . Hypercholesterolemia 08/21/2015  . Hyperlipidemia 07/06/2013  . Medication management 08/21/2015  . Pre-diabetes   . Rectal bleeding 09/21/2019  . Stiffness of left shoulder joint 07/19/2013    Past Surgical History:  Procedure Laterality Date  . BIOPSY  12/14/2019   Procedure: BIOPSY;  Surgeon: Daneil Dolin, MD;  [facility];  Service: Endoscopy;;  . CERVICAL SPINE SURGERY    . COLONOSCOPY  07/26/2012   Procedure: COLONOSCOPY;  Surgeon: Jamesetta So, MD;  [facility];  Service: Gastroenterology;  Laterality: N/A;  . COLONOSCOPY WITH PROPOFOL N/A 12/14/2019    Procedure: COLONOSCOPY WITH PROPOFOL;  Surgeon: Daneil Dolin, MD;  [facility];  Service: Endoscopy;  Laterality: N/A;  9:00am  . KNEE ARTHROSCOPY Left    torn ligament  . LUMBAR LAMINECTOMY/DECOMPRESSION MICRODISCECTOMY Bilateral 12/27/2019   Procedure: BIATERAL LUMBAR TWO- LUMBAR THREE, LUMBAR THREE- LUMBAR FOUR, LUMBAR FOUR-FIVE LAMINOTOMY/FORAMINOTOMY 3 LEVELS;  Surgeon: Newman Pies, MD;  Location: [facility];  Service: Neurosurgery;  Laterality: Bilateral;  POSTERIOR  . SPINE SURGERY N/A    Phreesia 02/24/2020    Family History  Problem Relation Age of Onset  . Asthma Mother   . Hypertension Father   . Colon cancer Neg Hx    Social History   Tobacco Use  . Smoking status: Never Smoker  . Smokeless tobacco: Never Used  Vaping Use  . Vaping Use: Never used  Substance Use Topics  . Alcohol use: No  . Drug use: No    Allergies  Allergen Reactions  . Metformin And Related Diarrhea and Nausea And Vomiting  . Statins Other (See Comments)    Muscle/joint pain     Current Meds  Medication Sig  . acetaminophen (TYLENOL) 650 MG CR tablet Take 1,300 mg by mouth every 8 (eight) hours as needed for pain.   Marland Kitchen amLODipine (NORVASC) 10 MG tablet Take 1 tablet (10 mg total) by mouth daily.  Marland Kitchen aspirin EC 81 MG tablet Take 81 mg by mouth daily.  Marland Kitchen  benazepril (LOTENSIN) 40 MG tablet Take 1 tablet (40 mg total) by mouth daily.  Marland Kitchen buPROPion (WELLBUTRIN XL) 150 MG 24 hr tablet Take 1 tablet (150 mg total) by mouth daily.  Marland Kitchen gabapentin (NEURONTIN) 300 MG capsule TAKE 1 CAPSULE THREE TIMES DAILY  . magnesium oxide (MAG-OX) 400 MG tablet Take 400 mg by mouth daily.  . meclizine (ANTIVERT) 12.5 MG tablet Take 1 tablet (12.5 mg total) by mouth 2 (two) times daily as needed for dizziness.  . meloxicam (MOBIC) 7.5 MG tablet Take 1 tablet (7.5 mg total) by mouth daily.  . multivitamin-iron-minerals-folic acid (CENTRUM) chewable tablet Chew 1 tablet by mouth daily.  . pantoprazole  (PROTONIX) 40 MG tablet Take 1 tablet (40 mg total) by mouth daily.  Marland Kitchen REPATHA SURECLICK 449 MG/ML SOAJ INJECT 140 MG INTO THE SKIN EVERY 14 DAY  . spironolactone-hydrochlorothiazide (ALDACTAZIDE) 25-25 MG tablet Take 1 tablet by mouth daily.    BP (!) 159/91   Pulse 77   Ht 5\' 6"  (1.676 m)   Wt 211 lb (95.7 kg)   BMI 34.06 kg/m   Physical Exam  General appearance: Well-developed well-nourished no gross deformities  Cardiovascular normal pulse and perfusion normal color without edema  Neurologically o sensation loss or deficits or pathologic reflexes  Psychological: Awake alert and oriented x3 mood and affect normal  Skin no lacerations or ulcerations no nodularity no palpable masses, no erythema or nodularity  Musculoskeletal:   Both knees are in varus  No effusion  Mild tenderness medial compartment bilateral  Flexion remains 135 degrees  Knees are stable       MEDICAL DECISION MAKING  A.  Encounter Diagnoses  Name Primary?  . Chronic pain of left knee   . Chronic pain of right knee   . Primary osteoarthritis of both knees Yes    B. DATA ANALYSED:   IMAGING: Interpretation of images: Internal images AP lateral and sunrise of both knees both knees are osteoarthritic with medial compartment disease and patellofemoral disease and mild varus deformities  Orders: None  Outside records reviewed: None   C. MANAGEMENT   Recommend oral anti-inflammatories for 1 month to see how he does as he has not been treated with any intervention.  We can proceed with injections next if this does not work  Meds ordered this encounter  Medications  . meloxicam (MOBIC) 7.5 MG tablet    Sig: Take 1 tablet (7.5 mg total) by mouth daily.    Dispense:  30 tablet    Refill:  0      Arther Abbott, MD  11/25/2020 10:37 AM

## 2020-11-25 NOTE — Patient Instructions (Signed)
Osteoarthritis  Osteoarthritis is a type of arthritis. It refers to joint pain or joint disease. Osteoarthritis affects tissue that covers the ends of bones in joints (cartilage). Cartilage acts as a cushion between the bones and helps them move smoothly. Osteoarthritis occurs when cartilage in the joints gets worn down. Osteoarthritis is sometimes called "wear and tear" arthritis. Osteoarthritis is the most common form of arthritis. It often occurs in older people. It is a condition that gets worse over time. The joints most often affected by this condition are in the fingers, toes, hips, knees, and spine, including the neck and lower back. What are the causes? This condition is caused by the wearing down of cartilage that covers the ends of bones. What increases the risk? The following factors may make you more likely to develop this condition:  Being age 50 or older.  Obesity.  Overuse of joints.  Past injury of a joint.  Past surgery on a joint.  Family history of osteoarthritis. What are the signs or symptoms? The main symptoms of this condition are pain, swelling, and stiffness in the joint. Other symptoms may include:  An enlarged joint.  More pain and further damage caused by small pieces of bone or cartilage that break off and float inside of the joint.  Small deposits of bone (osteophytes) that grow on the edges of the joint.  A grating or scraping feeling inside the joint when you move it.  Popping or creaking sounds when you move.  Difficulty walking or exercising.  An inability to grip items, twist your hand(s), or control the movements of your hands and fingers. How is this diagnosed? This condition may be diagnosed based on:  Your medical history.  A physical exam.  Your symptoms.  X-rays of the affected joint(s).  Blood tests to rule out other types of arthritis. How is this treated? There is no cure for this condition, but treatment can help control  pain and improve joint function. Treatment may include a combination of therapies, such as:  Pain relief techniques, such as: ? Applying heat and cold to the joint. ? Massage. ? A form of talk therapy called cognitive behavioral therapy (CBT). This therapy helps you set goals and follow up on the changes that you make.  Medicines for pain and inflammation. The medicines can be taken by mouth or applied to the skin. They include: ? NSAIDs, such as ibuprofen. ? Prescription medicines. ? Strong anti-inflammatory medicines (corticosteroids). ? Certain nutritional supplements.  A prescribed exercise program. You may work with a physical therapist.  Assistive devices, such as a brace, wrap, splint, specialized glove, or cane.  A weight control plan.  Surgery, such as: ? An osteotomy. This is done to reposition the bones and relieve pain or to remove loose pieces of bone and cartilage. ? Joint replacement surgery. You may need this surgery if you have advanced osteoarthritis. Follow these instructions at home: Activity  Rest your affected joints as told by your health care provider.  Exercise as told by your health care provider. He or she may recommend specific types of exercise, such as: ? Strengthening exercises. These are done to strengthen the muscles that support joints affected by arthritis. ? Aerobic activities. These are exercises, such as brisk walking or water aerobics, that increase your heart rate. ? Range-of-motion activities. These help your joints move more easily. ? Balance and agility exercises. Managing pain, stiffness, and swelling  If directed, apply heat to the affected area as often   as told by your health care provider. Use the heat source that your health care provider recommends, such as a moist heat pack or a heating pad. ? If you have a removable assistive device, remove it as told by your health care provider. ? Place a towel between your skin and the heat  source. If your health care provider tells you to keep the assistive device on while you apply heat, place a towel between the assistive device and the heat source. ? Leave the heat on for 20-30 minutes. ? Remove the heat if your skin turns bright red. This is especially important if you are unable to feel pain, heat, or cold. You may have a greater risk of getting burned.  If directed, put ice on the affected area. To do this: ? If you have a removable assistive device, remove it as told by your health care provider. ? Put ice in a plastic bag. ? Place a towel between your skin and the bag. If your health care provider tells you to keep the assistive device on during icing, place a towel between the assistive device and the bag. ? Leave the ice on for 20 minutes, 2-3 times a day. ? Move your fingers or toes often to reduce stiffness and swelling. ? Raise (elevate) the injured area above the level of your heart while you are sitting or lying down.      General instructions  Take over-the-counter and prescription medicines only as told by your health care provider.  Maintain a healthy weight. Follow instructions from your health care provider for weight control.  Do not use any products that contain nicotine or tobacco, such as cigarettes, e-cigarettes, and chewing tobacco. If you need help quitting, ask your health care provider.  Use assistive devices as told by your health care provider.  Keep all follow-up visits as told by your health care provider. This is important. Where to find more information  National Institute of Arthritis and Musculoskeletal and Skin Diseases: www.niams.nih.gov  National Institute on Aging: www.nia.nih.gov  American College of Rheumatology: www.rheumatology.org Contact a health care provider if:  You have redness, swelling, or a feeling of warmth in a joint that gets worse.  You have a fever along with joint or muscle aches.  You develop a  rash.  You have trouble doing your normal activities. Get help right away if:  You have pain that gets worse and is not relieved by pain medicine. Summary  Osteoarthritis is a type of arthritis that affects tissue covering the ends of bones in joints (cartilage).  This condition is caused by the wearing down of cartilage that covers the ends of bones.  The main symptom of this condition is pain, swelling, and stiffness in the joint.  There is no cure for this condition, but treatment can help control pain and improve joint function. This information is not intended to replace advice given to you by your health care provider. Make sure you discuss any questions you have with your health care provider. Document Revised: 07/03/2019 Document Reviewed: 07/03/2019 Elsevier Patient Education  2021 Elsevier Inc.  

## 2020-11-29 ENCOUNTER — Other Ambulatory Visit: Payer: Self-pay | Admitting: Nurse Practitioner

## 2020-11-29 DIAGNOSIS — I10 Essential (primary) hypertension: Secondary | ICD-10-CM

## 2020-12-09 ENCOUNTER — Telehealth: Payer: Self-pay

## 2020-12-09 NOTE — Telephone Encounter (Signed)
Appointment schedule  

## 2020-12-09 NOTE — Telephone Encounter (Signed)
Patient been having dizzy spells off and on for 2 years. Getting worse. Patient call back # [phone]

## 2020-12-09 NOTE — Telephone Encounter (Signed)
Please schedule pt an appt to come in the office

## 2020-12-10 ENCOUNTER — Other Ambulatory Visit: Payer: Self-pay

## 2020-12-10 ENCOUNTER — Encounter: Payer: Self-pay | Admitting: Nurse Practitioner

## 2020-12-10 ENCOUNTER — Ambulatory Visit (INDEPENDENT_AMBULATORY_CARE_PROVIDER_SITE_OTHER): Payer: Medicare HMO | Admitting: Nurse Practitioner

## 2020-12-10 DIAGNOSIS — R42 Dizziness and giddiness: Secondary | ICD-10-CM | POA: Diagnosis not present

## 2020-12-10 DIAGNOSIS — I10 Essential (primary) hypertension: Secondary | ICD-10-CM | POA: Diagnosis not present

## 2020-12-10 MED ORDER — SPIRONOLACTONE-HCTZ 25-25 MG PO TABS: 1.0000 | ORAL_TABLET | Freq: Every day | ORAL | 1 refills | Status: DC

## 2020-12-10 MED ORDER — MECLIZINE HCL 25 MG PO TABS: 25.0000 mg | ORAL_TABLET | Freq: Three times a day (TID) | ORAL | 0 refills | Status: DC | PRN

## 2020-12-10 NOTE — Assessment & Plan Note (Addendum)
-  likely BPPV since it is paroxysmal and responds to meclizine -INCREASE dose today; discussed that if it makes him sleepy or he has side effects, he can cut the tablet in half -he states that he saw ENT about 2 years ago, but no etiology was confirmed -he has appt with neuro in June for dizzy spells

## 2020-12-10 NOTE — Progress Notes (Signed)
Acute Office Visit  Subjective:    [name], male    DOB: November 12, 1940, 80 y.o.   MRN: [id number]  Chief Complaint  Patient presents with  . Dizziness    Ongoing intermittently x3 days, also has ringing in both ears.    HPI Patient is in today for dizziness. He has had ringing in his ears for the last 3 days. He states it has come and gone for the last 5 years.  He states that he has been to ENT and a specialist at [facility]. He states he couldn't sleep on Sunday d/t the ringing.  He hasn't tried anything at home for this.  He states that he feels worse when he turns to the right in his bed.  Past Medical History:  Diagnosis Date  . Aortic valve sclerosis   . Atherosclerosis of coronary artery without angina pectoris 07/05/2014  . Cervical spondylosis without myelopathy 02/13/2013  . Chest pain 01/29/2019  . Degeneration of cervical intervertebral disc 02/13/2013  . Depression    sometimes  . Diabetes mellitus type 2, diet-controlled ([place])   . Elevated troponin   . GERD (gastroesophageal reflux disease)   . Heart murmur   . HTN (hypertension) 07/06/2013  . Hypercholesterolemia 08/21/2015  . Hyperlipidemia 07/06/2013  . Medication management 08/21/2015  . Pre-diabetes   . Rectal bleeding 09/21/2019  . Stiffness of left shoulder joint 07/19/2013    Past Surgical History:  Procedure Laterality Date  . BIOPSY  12/14/2019   Procedure: BIOPSY;  Surgeon: Daneil Dolin, MD;  [facility];  Service: Endoscopy;;  . CERVICAL SPINE SURGERY    . COLONOSCOPY  07/26/2012   Procedure: COLONOSCOPY;  Surgeon: Jamesetta So, MD;  [facility];  Service: Gastroenterology;  Laterality: N/A;  . COLONOSCOPY WITH PROPOFOL N/A 12/14/2019   Procedure: COLONOSCOPY WITH PROPOFOL;  Surgeon: Daneil Dolin, MD;  [facility];  Service: Endoscopy;  Laterality: N/A;  9:00am  . KNEE ARTHROSCOPY Left    torn ligament  . LUMBAR LAMINECTOMY/DECOMPRESSION  MICRODISCECTOMY Bilateral 12/27/2019   Procedure: BIATERAL LUMBAR TWO- LUMBAR THREE, LUMBAR THREE- LUMBAR FOUR, LUMBAR FOUR-FIVE LAMINOTOMY/FORAMINOTOMY 3 LEVELS;  Surgeon: Newman Pies, MD;  Location: [place];  Service: Neurosurgery;  Laterality: Bilateral;  POSTERIOR  . SPINE SURGERY N/A    Phreesia 02/24/2020    Family History  Problem Relation Age of Onset  . Asthma Mother   . Hypertension Father   . Colon cancer Neg Hx     Social History   Socioeconomic History  . Marital status: Widowed    Spouse name: Cherly   . Number of children: 1  . Years of education: Not on file  . Highest education level: 12th grade  Occupational History    Comment: Retired   Tobacco Use  . Smoking status: Never Smoker  . Smokeless tobacco: Never Used  Vaping Use  . Vaping Use: Never used  Substance and Sexual Activity  . Alcohol use: No  . Drug use: No  . Sexual activity: Not Currently  Other Topics Concern  . Not on file  Social History Narrative   Live alone right now, wife passed in 2009    [place] 2 live in [place]      Enjoys playing golf      Diet: eats what he likes, sweet, fast food, does not eat a lot of canned or boxed foods.    Does not eat enough fruits and veggies.  Caffeine: 1 cup daily   Water: 3-4 bottles of water daily, Gatorade every now and then         Wears seat belt   Wears sun protection when golfing   Smoke detectors at home   Does not use phone while driving   Social Determinants of Health   Financial Resource Strain: Low Risk   . Difficulty of Paying Living Expenses: Not hard at all  Food Insecurity: No Food Insecurity  . Worried About Charity fundraiser in the Last Year: Never true  . Ran Out of Food in the Last Year: Never true  Transportation Needs: No Transportation Needs  . Lack of Transportation (Medical): No  . Lack of Transportation (Non-Medical): No  Physical Activity: Insufficiently Active  . Days of Exercise  per Week: 2 days  . Minutes of Exercise per Session: 10 min  Stress: No Stress Concern Present  . Feeling of Stress : Not at all  Social Connections: Moderately Isolated  . Frequency of Communication with Friends and Family: More than three times a week  . Frequency of Social Gatherings with Friends and Family: More than three times a week  . Attends Religious Services: More than 4 times per year  . Active Member of Clubs or Organizations: No  . Attends Archivist Meetings: Never  . Marital Status: Widowed  Intimate Partner Violence: Not on file    Outpatient Medications Prior to Visit  Medication Sig Dispense Refill  . acetaminophen (TYLENOL) 650 MG CR tablet Take 1,300 mg by mouth every 8 (eight) hours as needed for pain.     Marland Kitchen amLODipine (NORVASC) 10 MG tablet Take 1 tablet (10 mg total) by mouth daily. 90 tablet 1  . aspirin EC 81 MG tablet Take 81 mg by mouth daily.    . benazepril (LOTENSIN) 40 MG tablet Take 1 tablet (40 mg total) by mouth daily. 90 tablet 1  . buPROPion (WELLBUTRIN XL) 150 MG 24 hr tablet Take 1 tablet (150 mg total) by mouth daily. 90 tablet 2  . gabapentin (NEURONTIN) 300 MG capsule TAKE 1 CAPSULE THREE TIMES DAILY 90 capsule 0  . magnesium oxide (MAG-OX) 400 MG tablet Take 400 mg by mouth daily.    . meloxicam (MOBIC) 7.5 MG tablet Take 1 tablet (7.5 mg total) by mouth daily. 30 tablet 0  . multivitamin-iron-minerals-folic acid (CENTRUM) chewable tablet Chew 1 tablet by mouth daily.    . pantoprazole (PROTONIX) 40 MG tablet Take 1 tablet (40 mg total) by mouth daily. 90 tablet 2  . REPATHA SURECLICK 606 MG/ML SOAJ INJECT 140 MG INTO THE SKIN EVERY 14 DAY 2 mL 11  . meclizine (ANTIVERT) 12.5 MG tablet Take 1 tablet (12.5 mg total) by mouth 2 (two) times daily as needed for dizziness. 30 tablet 0  . spironolactone-hydrochlorothiazide (ALDACTAZIDE) 25-25 MG tablet TAKE 1 TABLET EVERY DAY 30 tablet 0  . furosemide (LASIX) 20 MG tablet Take 1 tablet (20  mg total) by mouth daily as needed for edema. 30 tablet 3  . potassium chloride SA (KLOR-CON) 20 MEQ tablet Take one tablet as needed when you need to take the Furosemide 30 tablet 0   No facility-administered medications prior to visit.    Allergies  Allergen Reactions  . Metformin And Related Diarrhea and Nausea And Vomiting  . Statins Other (See Comments)    Muscle/joint pain     Review of Systems  Constitutional: Negative.   HENT: Positive for tinnitus. Negative for  ear discharge, ear pain and hearing loss.   Respiratory: Negative.   Cardiovascular: Negative.   Neurological: Positive for dizziness.       Objective:    Physical Exam Constitutional:      Appearance: Normal appearance.  HENT:     Right Ear: Tympanic membrane, ear canal and external ear normal.     Left Ear: Tympanic membrane, ear canal and external ear normal.  Neurological:     Mental Status: He is alert.     Comments: Positive dix-hallpike maneuver today     BP (!) 147/75 (BP Location: Right Arm, Patient Position: Sitting, Cuff Size: Large)   Pulse 70   Ht 5' 6"  (1.676 m)   Wt 207 lb (93.9 kg)   SpO2 96%   BMI 33.41 kg/m  Wt Readings from Last 3 Encounters:  12/10/20 207 lb (93.9 kg)  11/25/20 211 lb (95.7 kg)  10/30/20 205 lb (93 kg)    Health Maintenance Due  Topic Date Due  . FOOT EXAM  05/10/2020  . PNA vac Low Risk Adult (2 of 2 - PCV13) 11/28/2020    There are no preventive care reminders to display for this patient.   Lab Results  Component Value Date   TSH 1.670 05/14/2020   Lab Results  Component Value Date   WBC 8.2 10/31/2020   HGB 14.1 10/31/2020   HCT 41.4 10/31/2020   MCV 90 10/31/2020   PLT 337 10/31/2020   Lab Results  Component Value Date   NA 142 10/31/2020   K 4.1 10/31/2020   CO2 20 10/31/2020   GLUCOSE 117 (H) 10/31/2020   BUN 11 10/31/2020   CREATININE 1.26 10/31/2020   BILITOT 0.6 10/31/2020   ALKPHOS 88 10/31/2020   AST 22 10/31/2020   ALT 15  10/31/2020   PROT 6.9 10/31/2020   ALBUMIN 4.2 10/31/2020   CALCIUM 9.0 10/31/2020   ANIONGAP 13 12/12/2019   EGFR 58 (L) 10/31/2020   Lab Results  Component Value Date   CHOL 122 10/31/2020   Lab Results  Component Value Date   HDL 45 10/31/2020   Lab Results  Component Value Date   LDLCALC 54 10/31/2020   Lab Results  Component Value Date   TRIG 131 10/31/2020   Lab Results  Component Value Date   CHOLHDL 2.9 08/01/2020   Lab Results  Component Value Date   HGBA1C 6.4 (H) 10/31/2020       Assessment & Plan:   Problem List Items Addressed This Visit      Cardiovascular and Mediastinum   HTN (hypertension)   Relevant Medications   spironolactone-hydrochlorothiazide (ALDACTAZIDE) 25-25 MG tablet     Other   Dizzy spells    -likely BPPV since it is paroxysmal and responds to meclizine -INCREASE dose today; discussed that if it makes him sleepy or he has side effects, he can cut the tablet in half -he states that he saw ENT about 2 years ago, but no etiology was confirmed -he has appt with neuro in June for dizzy spells          Meds ordered this encounter  Medications  . spironolactone-hydrochlorothiazide (ALDACTAZIDE) 25-25 MG tablet    Sig: Take 1 tablet by mouth daily.    Dispense:  90 tablet    Refill:  1  . meclizine (ANTIVERT) 25 MG tablet    Sig: Take 1 tablet (25 mg total) by mouth 3 (three) times daily as needed for dizziness.    Dispense:  30 tablet    Refill:  0     Noreene Larsson, NP

## 2020-12-13 ENCOUNTER — Emergency Department (HOSPITAL_COMMUNITY): Payer: Medicare HMO

## 2020-12-13 ENCOUNTER — Encounter (HOSPITAL_COMMUNITY): Payer: Self-pay

## 2020-12-13 ENCOUNTER — Other Ambulatory Visit: Payer: Self-pay

## 2020-12-13 ENCOUNTER — Emergency Department (HOSPITAL_COMMUNITY)
Admission: EM | Admit: 2020-12-13 | Discharge: 2020-12-13 | Disposition: A | Discharge status: Home / Self Care | Payer: Medicare HMO | Attending: Emergency Medicine | Admitting: Emergency Medicine

## 2020-12-13 DIAGNOSIS — R0600 Dyspnea, unspecified: Secondary | ICD-10-CM | POA: Diagnosis not present

## 2020-12-13 DIAGNOSIS — E119 Type 2 diabetes mellitus without complications: Secondary | ICD-10-CM | POA: Insufficient documentation

## 2020-12-13 DIAGNOSIS — R0609 Other forms of dyspnea: Secondary | ICD-10-CM

## 2020-12-13 DIAGNOSIS — I10 Essential (primary) hypertension: Secondary | ICD-10-CM | POA: Diagnosis not present

## 2020-12-13 DIAGNOSIS — R002 Palpitations: Secondary | ICD-10-CM | POA: Diagnosis not present

## 2020-12-13 DIAGNOSIS — R0602 Shortness of breath: Secondary | ICD-10-CM | POA: Diagnosis not present

## 2020-12-13 DIAGNOSIS — Z7689 Persons encountering health services in other specified circumstances: Secondary | ICD-10-CM | POA: Diagnosis not present

## 2020-12-13 LAB — BASIC METABOLIC PANEL
Anion gap: 6 (ref 5–15)
BUN: 18 mg/dL (ref 8–23)
CO2: 27 mmol/L (ref 22–32)
Calcium: 8.4 mg/dL — ABNORMAL LOW (ref 8.9–10.3)
Chloride: 104 mmol/L (ref 98–111)
Creatinine, Ser: 1.12 mg/dL (ref 0.61–1.24)
GFR, Estimated: >60 mL/min (ref >60)
Glucose, Bld: 124 mg/dL — ABNORMAL HIGH (ref 70–99)
Potassium: 3.7 mmol/L (ref 3.5–5.1)
Sodium: 137 mmol/L (ref 135–145)

## 2020-12-13 LAB — CBC
HCT: 41.5 % (ref 39.0–52.0)
Hemoglobin: 13.7 g/dL (ref 13.0–17.0)
MCH: 30.8 pg (ref 26.0–34.0)
MCHC: 33 g/dL (ref 30.0–36.0)
MCV: 93.3 fL (ref 80.0–100.0)
Platelets: 320 10*3/uL (ref 150–400)
RBC: 4.45 MIL/uL (ref 4.22–5.81)
RDW: 12.7 % (ref 11.5–15.5)
WBC: 8 10*3/uL (ref 4.0–10.5)
nRBC: 0 % (ref 0.0–0.2)

## 2020-12-13 LAB — D-DIMER, QUANTITATIVE: D-Dimer, Quant: <0.27 ug/mL-FEU (ref 0.00–0.50)

## 2020-12-13 LAB — MAGNESIUM: Magnesium: 2.2 mg/dL (ref 1.7–2.4)

## 2020-12-13 LAB — TROPONIN I (HIGH SENSITIVITY)
Troponin I (High Sensitivity): 18 ng/L — ABNORMAL HIGH (ref <18)
Troponin I (High Sensitivity): 18 ng/L — ABNORMAL HIGH (ref <18)

## 2020-12-13 NOTE — Discharge Instructions (Addendum)
You were evaluated in the Emergency Department and after careful evaluation, we did not find any emergent condition requiring admission or further testing in the hospital.  Your exam/testing today was overall reassuring. Recommend follow-up with your primary care doctor and/or cardiologist to discuss your symptoms.  Please return to the Emergency Department if you experience any worsening of your condition.  Thank you for allowing [place] to be a part of your care.

## 2020-12-13 NOTE — ED Provider Notes (Signed)
[facility] Emergency Department Provider Note MRN:  [id number]  Arrival date & time: 12/13/20     Chief Complaint   Palpitations   History of Present Illness   Douglas Keller is a 80 y.o. year-old male with a history of diabetes, GERD, hypertension, hyperlipidemia presenting to the ED with chief complaint of palpitations.  Sudden onset palpitations in the chest at 8:30 PM.  Has also been experiencing shortness of breath and dyspnea exertion for the past 2 or 3 weeks, seems to be getting worse.  Became short of breath when simply walking to his emergency room, which is abnormal for him.  Denies fever, no cough, no cold-like symptoms.  No headache, no vision change, no numbness or weakness to the arms or legs.  No abdominal pain.  Denies chest pain but describes the palpitations as a "thumping in the chest".  Symptoms are now resolved.  Review of Systems  A complete 10 system review of systems was obtained and all systems are negative except as noted in the HPI and PMH.   Patient's Health History    Past Medical History:  Diagnosis Date  . Aortic valve sclerosis   . Atherosclerosis of coronary artery without angina pectoris 07/05/2014  . Cervical spondylosis without myelopathy 02/13/2013  . Chest pain 01/29/2019  . Degeneration of cervical intervertebral disc 02/13/2013  . Depression    sometimes  . Diabetes mellitus type 2, diet-controlled ([place])   . Elevated troponin   . GERD (gastroesophageal reflux disease)   . Heart murmur   . HTN (hypertension) 07/06/2013  . Hypercholesterolemia 08/21/2015  . Hyperlipidemia 07/06/2013  . Medication management 08/21/2015  . Pre-diabetes   . Rectal bleeding 09/21/2019  . Stiffness of left shoulder joint 07/19/2013    Past Surgical History:  Procedure Laterality Date  . BIOPSY  12/14/2019   Procedure: BIOPSY;  Surgeon: Daneil Dolin, MD;  [facility];  Service: Endoscopy;;  . CERVICAL SPINE SURGERY    .  COLONOSCOPY  07/26/2012   Procedure: COLONOSCOPY;  Surgeon: Jamesetta So, MD;  [facility];  Service: Gastroenterology;  Laterality: N/A;  . COLONOSCOPY WITH PROPOFOL N/A 12/14/2019   Procedure: COLONOSCOPY WITH PROPOFOL;  Surgeon: Daneil Dolin, MD;  [facility];  Service: Endoscopy;  Laterality: N/A;  9:00am  . KNEE ARTHROSCOPY Left    torn ligament  . LUMBAR LAMINECTOMY/DECOMPRESSION MICRODISCECTOMY Bilateral 12/27/2019   Procedure: BIATERAL LUMBAR TWO- LUMBAR THREE, LUMBAR THREE- LUMBAR FOUR, LUMBAR FOUR-FIVE LAMINOTOMY/FORAMINOTOMY 3 LEVELS;  Surgeon: Newman Pies, MD;  Location: [place];  Service: Neurosurgery;  Laterality: Bilateral;  POSTERIOR  . SPINE SURGERY N/A    Phreesia 02/24/2020    Family History  Problem Relation Age of Onset  . Asthma Mother   . Hypertension Father   . Colon cancer Neg Hx     Social History   Socioeconomic History  . Marital status: Widowed    Spouse name: Cherly   . Number of children: 1  . Years of education: Not on file  . Highest education level: 12th grade  Occupational History    Comment: Retired   Tobacco Use  . Smoking status: Never Smoker  . Smokeless tobacco: Never Used  Vaping Use  . Vaping Use: Never used  Substance and Sexual Activity  . Alcohol use: No  . Drug use: No  . Sexual activity: Not Currently  Other Topics Concern  . Not on file  Social History Narrative   Live  alone right now, wife passed in 2009    [place] 2 live in [place]      Enjoys playing golf      Diet: eats what he likes, sweet, fast food, does not eat a lot of canned or boxed foods.    Does not eat enough fruits and veggies.       Caffeine: 1 cup daily   Water: 3-4 bottles of water daily, Gatorade every now and then         Wears seat belt   Wears sun protection when golfing   Smoke detectors at home   Does not use phone while driving   Social Determinants of Health   Financial Resource Strain:  Low Risk   . Difficulty of Paying Living Expenses: Not hard at all  Food Insecurity: No Food Insecurity  . Worried About Charity fundraiser in the Last Year: Never true  . Ran Out of Food in the Last Year: Never true  Transportation Needs: No Transportation Needs  . Lack of Transportation (Medical): No  . Lack of Transportation (Non-Medical): No  Physical Activity: Insufficiently Active  . Days of Exercise per Week: 2 days  . Minutes of Exercise per Session: 10 min  Stress: No Stress Concern Present  . Feeling of Stress : Not at all  Social Connections: Moderately Isolated  . Frequency of Communication with Friends and Family: More than three times a week  . Frequency of Social Gatherings with Friends and Family: More than three times a week  . Attends Religious Services: More than 4 times per year  . Active Member of Clubs or Organizations: No  . Attends Archivist Meetings: Never  . Marital Status: Widowed  Intimate Partner Violence: Not on file     Physical Exam   Vitals:   12/13/20 0500 12/13/20 0530  BP: (!) 149/75 (!) 144/76  Pulse: 67 (!) 57  Resp: 19 18  Temp:    SpO2: 96% 97%    CONSTITUTIONAL: Well-appearing, NAD NEURO:  Alert and oriented x 3, no focal deficits EYES:  eyes equal and reactive ENT/NECK:  no LAD, no JVD CARDIO: Regular rate, well-perfused, normal S1 and S2 PULM:  CTAB no wheezing or rhonchi GI/GU:  normal bowel sounds, non-distended, non-tender MSK/SPINE:  No gross deformities, no edema SKIN:  no rash, atraumatic PSYCH:  Appropriate speech and behavior  *Additional and/or pertinent findings included in MDM below  Diagnostic and Interventional Summary    EKG Interpretation  Date/Time:  Friday Dec 13 2020 02:17:55 EDT Ventricular Rate:  71 PR Interval:  177 QRS Duration: 101 QT Interval:  400 QTC Calculation: 435 R Axis:   -9 Text Interpretation: Sinus rhythm Baseline wander in lead(s) II III aVF No significant change was  found Confirmed by Gerlene Fee [id number]) on 12/13/2020 2:31:33 AM      Labs Reviewed  BASIC METABOLIC PANEL - Abnormal; Notable for the following components:      Result Value   Glucose, Bld 124 (*)    Calcium 8.4 (*)    All other components within normal limits  TROPONIN I (HIGH SENSITIVITY) - Abnormal; Notable for the following components:   Troponin I (High Sensitivity) 18 (*)    All other components within normal limits  TROPONIN I (HIGH SENSITIVITY) - Abnormal; Notable for the following components:   Troponin I (High Sensitivity) 18 (*)    All other components within normal limits  CBC  D-DIMER, QUANTITATIVE  MAGNESIUM  DG Chest Port 1 View  Final Result      Medications - No data to display   Procedures  /  Critical Care .1-3 Lead EKG Interpretation Performed by: Maudie Flakes, MD Authorized by: Maudie Flakes, MD     Interpretation: normal     ECG rate:  70s   ECG rate assessment: normal     Rhythm: sinus rhythm     Ectopy: PVCs     Conduction: abnormal   Comments:     Cardiac monitoring was ordered to monitor the patient for dysrhythmia.  I personally interpreted the patient's cardiac monitor while at the bedside.     ED Course and Medical Decision Making  I have reviewed the triage vital signs, the nursing notes, and pertinent available records from the EMR.  Listed above are laboratory and imaging tests that I personally ordered, reviewed, and interpreted and then considered in my medical decision making (see below for details).  Palpitations as well as dyspnea on exertion recently.  Considering cardiac arrhythmia, less likely ACS or PE.  Patient's symptoms are now resolved, vital signs are reassuring, sitting comfortably.  No evidence of fluid overload on exam, no evidence of DVT on exam.  Lung sounds clear.  EKG is reassuring, sinus rhythm, no ischemic findings or changes.  Will be screening with chest x-ray, troponin, D-dimer.     D-dimer negative,  troponin is minimally elevated and near patient's baseline and upon repeat is flat.  Patient has been without symptoms during his 4-hour emergency department visit.  He does have some intermittent PVCs on cardiac monitoring.  Appropriate for discharge with cardiology follow-up.  Barth Kirks. Sedonia Small, MD [place] [email or web address]  Final Clinical Impressions(s) / ED Diagnoses     ICD-10-CM   1. Palpitations  R00.2   2. DOE (dyspnea on exertion)  R06.00     ED Discharge Orders    None       Discharge Instructions Discussed with and Provided to Patient:     Discharge Instructions     You were evaluated in the Emergency Department and after careful evaluation, we did not find any emergent condition requiring admission or further testing in the hospital.  Your exam/testing today was overall reassuring. Recommend follow-up with your primary care doctor and/or cardiologist to discuss your symptoms.  Please return to the Emergency Department if you experience any worsening of your condition.  Thank you for allowing [place] to be a part of your care.        Maudie Flakes, MD 12/13/20 [phone]

## 2020-12-13 NOTE — ED Triage Notes (Signed)
Pt states that he started having palpitations approx 4 hours ago. Pt denies any chest pain but feels like his heart is "thumping". Also complains of SOB that has been going on for a month.

## 2020-12-23 ENCOUNTER — Encounter: Payer: Self-pay | Admitting: Orthopedic Surgery

## 2020-12-23 ENCOUNTER — Ambulatory Visit (INDEPENDENT_AMBULATORY_CARE_PROVIDER_SITE_OTHER): Payer: Medicare HMO | Admitting: Orthopedic Surgery

## 2020-12-23 ENCOUNTER — Other Ambulatory Visit: Payer: Self-pay

## 2020-12-23 VITALS — BP 136/65 | HR 56 | Ht 66.0 in | Wt 208.2 lb

## 2020-12-23 DIAGNOSIS — M25562 Pain in left knee: Secondary | ICD-10-CM

## 2020-12-23 DIAGNOSIS — M170 Bilateral primary osteoarthritis of knee: Secondary | ICD-10-CM

## 2020-12-23 DIAGNOSIS — M25561 Pain in right knee: Secondary | ICD-10-CM

## 2020-12-23 DIAGNOSIS — G8929 Other chronic pain: Secondary | ICD-10-CM

## 2020-12-23 MED ORDER — MELOXICAM 7.5 MG PO TABS: 7.5000 mg | ORAL_TABLET | Freq: Every day | ORAL | 5 refills | Status: DC

## 2020-12-23 NOTE — Progress Notes (Signed)
Chief Complaint  Patient presents with  . Knee Pain    Bilateral/ they are doing good   He s doing well  Encounter Diagnoses  Name Primary?  . Chronic pain of left knee Yes  . Chronic pain of right knee   . Primary osteoarthritis of both knees     hes taking mobic every other day   Focused orthopedic exam focuses on both knees  He is in bilateral varus its not too bad but noticeable.  He has mild flexion contractures in the right and left knee but his flexion arc is easily 120 degrees bilaterally has no instability has some mild tenderness medial joint lines but no effusions  He is doing well and then keep him on his Mobic I refilled his prescription  He denies any problems with his meloxicam   Meds ordered this encounter  Medications  . meloxicam (MOBIC) 7.5 MG tablet    Sig: Take 1 tablet (7.5 mg total) by mouth daily.    Dispense:  90 tablet    Refill:  5    Follow-up in a year  (2 chronic problem stable medication refill)

## 2021-01-15 ENCOUNTER — Encounter: Payer: Self-pay | Admitting: Neurology

## 2021-01-15 ENCOUNTER — Ambulatory Visit (INDEPENDENT_AMBULATORY_CARE_PROVIDER_SITE_OTHER): Payer: Medicare HMO

## 2021-01-15 ENCOUNTER — Other Ambulatory Visit: Payer: Self-pay | Admitting: Cardiovascular Disease

## 2021-01-15 ENCOUNTER — Ambulatory Visit: Payer: Medicare HMO | Admitting: Neurology

## 2021-01-15 ENCOUNTER — Telehealth: Payer: Self-pay | Admitting: Cardiovascular Disease

## 2021-01-15 VITALS — BP 146/74 | HR 59 | Ht 66.0 in | Wt 210.0 lb

## 2021-01-15 DIAGNOSIS — R42 Dizziness and giddiness: Secondary | ICD-10-CM

## 2021-01-15 DIAGNOSIS — R001 Bradycardia, unspecified: Secondary | ICD-10-CM

## 2021-01-15 DIAGNOSIS — R55 Syncope and collapse: Secondary | ICD-10-CM

## 2021-01-15 DIAGNOSIS — H9313 Tinnitus, bilateral: Secondary | ICD-10-CM | POA: Diagnosis not present

## 2021-01-15 DIAGNOSIS — H9190 Unspecified hearing loss, unspecified ear: Secondary | ICD-10-CM | POA: Diagnosis not present

## 2021-01-15 NOTE — Progress Notes (Signed)
Pt advised of recommendation and has already contacted cardiology.

## 2021-01-15 NOTE — Telephone Encounter (Signed)
Spoke to patient . He states he saw  the neurologist - heart rate was in the 30's while in the office--   asymptomatic. Patient states Dr Benito Mccreedy note is in [place]. They informed patient to contact the cardiology . No EKG obtained

## 2021-01-15 NOTE — Telephone Encounter (Signed)
Spoke with the patient. Per Dr. Sallyanne Kuster, he would like for the patient to wear a 14 day monitor and then follow up with him after.   The monitor has been ordered. The patient will call when he receives it and come in to have it placed.  Appointment made for 02/19/21 with Dr. Sallyanne Kuster.

## 2021-01-15 NOTE — Progress Notes (Addendum)
[facility]    Provider:  Dr Jaynee Eagles Requesting Provider: Perlie Mayo, NP Primary Care Provider:  Noreene Larsson, NP  CC:  dizziness  HPI:  Douglas Keller is a 80 y.o. male here as requested by Perlie Mayo, NP for dizzy spells.  Past medical history aortic valve sclerosis, coronary artery disease, cervical spondylosis without myelopathy, chest pain, degeneration of cervical intervertebral disc, depression, diabetes, GERD, heart murmur, hypertension, hypercholesterolemia/hyperlipidemia.  Patient already saw neurology in 2019 for the dizziness.  Has been to ENT, Neurology, pcp, cardiology. Started 4-5 years ago. Daily. 4 years ago he had complete loss of consciousness with an episode while playing golf but not since. Happens multiple times a day. More lightheaded then room spinning. Also ringing in the ears for years. Can happen when sitting, laying down, used to be positionally to the right side would feel like he is blacking out. Can happen anytime. Most of it happens at night for some reason when it is time to go to bed, he will get up, his ears will start ringing, usually from sitting to standing., ears will start ringing, feels lightheaded, no room spinning but he feels movement, he went to the ED a month ago due to palpitations, he may get sweaty, he has no energy, getting up and walking around helps, no vomiting, no headache association, drinking water will help, no weakness or vision changes, he feels he has less energy, ringing in both ears, can be brief and feels better, more lightheaded then room spinning.    Reviewed notes, labs and imaging from outside physicians, which showed  Patient already saw neurology in 2019 for the dizziness.  And tinnitus.  He reported it started after cervical spine surgery in 2014, episodes come on quickly, ears started to rain, can last for hours, he had heart monitoring and imaging of his blood vessels, he saw ENT and was diagnosed  with BPPV and went to rehab for a week, dizzy episodes feel lightheaded like he is going to pass out, denied any vertigo sensation or room spinning.  At that time CT of the head and CTA of the head and neck did not show any etiology in fact CTA of the head and neck showed no hemodynamically significant stenosis or flow-limiting stenosis.  MRI of the brain was checked without etiology (see report below).I reviewed the records.  I reviewed Dr. Sharlynn Oliphant notes: Patient was seen for an acute visit for dizziness, ringing in his ears for several days, ongoing on and off for 5 years, he has been to ENT and a specialist at [facility], he could not sleep on Sunday because of the rain, he has not tried anything at home for this, he states he feels worse when he turns to the right in his bed.  Patient had a positive Dix-Hallpike maneuver in the office during their visit.  Likely BPPV.   Labs reviewed included normal thyroid October 2021, hemoglobin A1c 6.4 in April 2022.  May 2022: BMP showed elevated glucose otherwise unremarkable with BUN 18 and creatinine 1.12, CBC normal.   MRI brain 04/2018: rebiewed images and agree:   FINDINGS: Brain: Age related atrophy, focally more pronounced in the parietal regions right more than left. No sign of acute or subacute infarction. Minimal small vessel ischemic change of the cerebral hemispheric white matter. No focal insult of the brainstem or cerebellum. No mass lesion, hemorrhage, hydrocephalus or extra-axial collection.   Vascular: Major vessels at the base of the brain  show flow.   Skull and upper cervical spine: Negative   Sinuses/Orbits: Acute sinusitis of the right maxillary sinus with an air-fluid level. Orbits negative. Mastoid regions are clear.   Other: None   IMPRESSION: No acute intracranial finding. Atrophy with parietal prominence, right more than left. Minimal small vessel change of the cerebral hemispheric white matter.   Acute right  maxillary sinusitis which could be symptomatic.  MRI cervical spine 03/2020: personally reviewed report:  IMPRESSION: 1. Severe foraminal stenosis bilaterally at C3-C4. Moderate to severe foraminal stenosis bilaterally on the right at C2-C3 and bilaterally at C7-T1 2. Status post C3-C7 posterior fusion and decompression without significant canal stenosis. 3. Additional milder degenerative changes are detailed above.   Review of Systems: Patient complains of symptoms per HPI as well as the following symptoms dizzy. Pertinent negatives and positives per HPI. All others negative.   Social History   Socioeconomic History   Marital status: Widowed    Spouse name: Cherly    Number of children: 1   Years of education: Not on file   Highest education level: 12th grade  Occupational History    Comment: Retired   Tobacco Use   Smoking status: Never   Smokeless tobacco: Never  Vaping Use   Vaping Use: Never used  Substance and Sexual Activity   Alcohol use: No   Drug use: No   Sexual activity: Not Currently  Other Topics Concern   Not on file  Social History Narrative   Live alone right now, wife passed in 2009    [place] daughter-grandchild 2 live in [place]      Enjoys playing golf      Diet: eats what he likes, sweet, fast food, does not eat a lot of canned or boxed foods.    Does not eat enough fruits and veggies.       Caffeine: 1 cup daily   Water: 3-4 bottles of water daily, Gatorade every now and then         Wears seat belt   Wears sun protection when golfing   Smoke detectors at home   Does not use phone while driving   Social Determinants of Health   Financial Resource Strain: Low Risk    Difficulty of Paying Living Expenses: Not hard at all  Food Insecurity: No Food Insecurity   Worried About Charity fundraiser in the Last Year: Never true   Arboriculturist in the Last Year: Never true  Transportation Needs: No Transportation Needs   Lack of  Transportation (Medical): No   Lack of Transportation (Non-Medical): No  Physical Activity: Insufficiently Active   Days of Exercise per Week: 2 days   Minutes of Exercise per Session: 10 min  Stress: No Stress Concern Present   Feeling of Stress : Not at all  Social Connections: Moderately Isolated   Frequency of Communication with Friends and Family: More than three times a week   Frequency of Social Gatherings with Friends and Family: More than three times a week   Attends Religious Services: More than 4 times per year   Active Member of [employer] or Organizations: No   Attends Archivist Meetings: Never   Marital Status: Widowed  Human resources officer Violence: Not on file    Family History  Problem Relation Age of Onset   Asthma Mother    Hypertension Father    Colon cancer Neg Hx     Past Medical History:  Diagnosis Date  Aortic valve sclerosis    Atherosclerosis of coronary artery without angina pectoris 07/05/2014   Cervical spondylosis without myelopathy 02/13/2013   Chest pain 01/29/2019   Degeneration of cervical intervertebral disc 02/13/2013   Depression    sometimes   Diabetes mellitus type 2, diet-controlled (HCC)    Elevated troponin    GERD (gastroesophageal reflux disease)    Heart murmur    HTN (hypertension) 07/06/2013   Hypercholesterolemia 08/21/2015   Hyperlipidemia 07/06/2013   Medication management 08/21/2015   Pre-diabetes    Rectal bleeding 09/21/2019   Stiffness of left shoulder joint 07/19/2013    Patient Active Problem List   Diagnosis Date Noted   Left knee pain 10/30/2020   Decreased GFR 07/31/2020   Spinal stenosis of lumbar region with neurogenic claudication 12/27/2019   Spinal stenosis of lumbar region 11/29/2019   Neck and shoulder pain 09/05/2019   Other insomnia 08/22/2019   Dizzy spells 08/01/2019   Depression, major, single episode, moderate ([place]) 07/07/2019   Obesity (BMI 30.0-34.9) 06/21/2019   Gastroesophageal reflux  disease    Type 2 diabetes mellitus with hyperlipidemia ([place]) 09/01/2018   Heterozygous familial hypercholesterolemia 09/01/2018   Atherosclerosis of coronary artery without angina pectoris 07/05/2014   Hyperlipidemia 07/06/2013   HTN (hypertension) 07/06/2013   Spinal stenosis in cervical region 02/13/2013    Past Surgical History:  Procedure Laterality Date   BIOPSY  12/14/2019   Procedure: BIOPSY;  Surgeon: Daneil Dolin, MD;  [facility];  Service: Endoscopy;;   CERVICAL SPINE SURGERY     COLONOSCOPY  07/26/2012   Procedure: COLONOSCOPY;  Surgeon: Jamesetta So, MD;  [facility];  Service: Gastroenterology;  Laterality: N/A;   COLONOSCOPY WITH PROPOFOL N/A 12/14/2019   Procedure: COLONOSCOPY WITH PROPOFOL;  Surgeon: Daneil Dolin, MD;  Location: [facility];  Service: Endoscopy;  Laterality: N/A;  9:00am   KNEE ARTHROSCOPY Left    torn ligament   LUMBAR LAMINECTOMY/DECOMPRESSION MICRODISCECTOMY Bilateral 12/27/2019   Procedure: BIATERAL LUMBAR TWO- LUMBAR THREE, LUMBAR THREE- LUMBAR FOUR, LUMBAR FOUR-FIVE LAMINOTOMY/FORAMINOTOMY 3 LEVELS;  Surgeon: Newman Pies, MD;  Location: [facility];  Service: Neurosurgery;  Laterality: Bilateral;  POSTERIOR   SPINE SURGERY N/A    Phreesia 02/24/2020    Current Outpatient Medications  Medication Sig Dispense Refill   acetaminophen (TYLENOL) 650 MG CR tablet Take 1,300 mg by mouth every 8 (eight) hours as needed for pain.      amLODipine (NORVASC) 10 MG tablet Take 1 tablet (10 mg total) by mouth daily. 90 tablet 1   aspirin EC 81 MG tablet Take 81 mg by mouth daily.     benazepril (LOTENSIN) 40 MG tablet Take 1 tablet (40 mg total) by mouth daily. 90 tablet 1   buPROPion (WELLBUTRIN XL) 150 MG 24 hr tablet Take 1 tablet (150 mg total) by mouth daily. 90 tablet 2   gabapentin (NEURONTIN) 300 MG capsule TAKE 1 CAPSULE THREE TIMES DAILY 90 capsule 0   magnesium oxide (MAG-OX) 400 MG tablet Take 400 mg by mouth daily.      meclizine (ANTIVERT) 25 MG tablet Take 1 tablet (25 mg total) by mouth 3 (three) times daily as needed for dizziness. 30 tablet 0   meloxicam (MOBIC) 7.5 MG tablet Take 1 tablet (7.5 mg total) by mouth daily. 90 tablet 5   multivitamin-iron-minerals-folic acid (CENTRUM) chewable tablet Chew 1 tablet by mouth daily.     pantoprazole (PROTONIX) 40 MG tablet Take 1 tablet (40 mg  total) by mouth daily. 90 tablet 2   REPATHA SURECLICK 412 MG/ML SOAJ INJECT 140 MG INTO THE SKIN EVERY 14 DAY 2 mL 11   spironolactone-hydrochlorothiazide (ALDACTAZIDE) 25-25 MG tablet Take 1 tablet by mouth daily. 90 tablet 1   No current facility-administered medications for this visit.    Allergies as of 01/15/2021 - Review Complete 01/15/2021  Allergen Reaction Noted   Metformin and related Diarrhea and Nausea And Vomiting 10/30/2020   Statins Other (See Comments) 08/01/2019    Vitals: BP (!) 146/74 (BP Location: Left Arm, Patient Position: Supine)   Pulse (!) 59   Ht 5\' 6"  (1.676 m)   Wt 210 lb (95.3 kg)   BMI 33.89 kg/m  Last Weight:  Wt Readings from Last 1 Encounters:  01/15/21 210 lb (95.3 kg)   Last Height:   Ht Readings from Last 1 Encounters:  01/15/21 5\' 6"  (1.676 m)     Physical exam: Exam: Gen: NAD, conversant, well nourised, obese, well groomed                     CV: bradycardic. No Carotid Bruits. No peripheral edema, warm, nontender Eyes: Conjunctivae clear without exudates or hemorrhage  Neuro: Detailed Neurologic Exam  Speech:    Speech is normal; fluent and spontaneous with normal comprehension.  Cognition:    The patient is oriented to person, place, and time;     recent and remote memory intact;     language fluent;     normal attention, concentration,     fund of knowledge Cranial Nerves:    The pupils are equal, round, and reactive to light. Pupils too small to visualize fundi Visual fields are full to finger confrontation. Extraocular movements are intact.  Trigeminal sensation is intact and the muscles of mastication are normal. The face is symmetric. The palate elevates in the midline. Hearing intact. Voice is normal. Shoulder shrug is normal. The tongue has normal motion without fasciculations.   Coordination:    Normal finger to nose   Gait:  Slightly wide based and antalgic but not ataxic.   Motor Observation:    No asymmetry, no atrophy, and no involuntary movements noted. Tone:    Normal muscle tone.    Posture:    Posture is normal. normal erect    Strength:    Strength is V/V in the upper and lower limbs. (Possible some mild right prox weakness LE due to pain)     Sensation: intact to LT     Reflex Exam:  DTR's:    Deep tendon reflexes in the upper and lower extremities are trace to 1+ and symmetrical bilaterally.   Toes:    The toes are downgoing bilaterally.   Clonus:    Clonus is absent.    Assessment/Plan:  80 y.o. male here as requested by Perlie Mayo, NP and Demetrius Revel for dizzy spells.  Past medical history aortic valve sclerosis, coronary artery disease, cervical spondylosis without myelopathy, chest pain, degeneration of cervical intervertebral disc, depression, diabetes, GERD, heart murmur, hypertension, hypercholesterolemia/hyperlipidemia. Neuro exam is non focal. MRI/CTA H&N 2019 did not show etiology, I do not think this is of primary neurologic etiology but will reimage his brain.  - Today his HR is in the lower 30s, follow up with primary care(Dr. Pearline Cables) and cardiology( Dr. Renaee Munda), maybe bradycardia causing symptoms? However he is asymptomatic today with this pulse. We checked his pulse and BP manually to verify. - I emailed Drs [employer]  and Demetrius Revel regarding bradycardia  Lying 146/74 P59 Sitting 149/65 P34 Standing 152/64 P37 Standing 3 mins 154/77 P36  Orders Placed This Encounter  Procedures   MR BRAIN W WO CONTRAST   No orders of the defined types were placed in this encounter.   Cc:  Perlie Mayo, NP,  Noreene Larsson, NP  Sarina Ill, MD  [facility] [address] [place], [place] [address]  Phone [phone] Fax [phone]  I spent over 40 minutes of face-to-face and non-face-to-face time with patient on the  1. Dizziness   2. Syncope, unspecified syncope type   3. Vertigo   4. Perceived hearing changes   5. Tinnitus of both ears    diagnosis.  This included previsit chart review, lab review, study review, order entry, electronic health record documentation, patient education on the different diagnostic and therapeutic options, counseling and coordination of care, risks and benefits of management, compliance, or risk factor reduction

## 2021-01-15 NOTE — Progress Notes (Unsigned)
Patient enrolled for [employer] to ship a 14 day ZIO XT monitor to address on file.

## 2021-01-15 NOTE — Addendum Note (Signed)
Addended by: Sarina Ill B on: 01/15/2021 03:23 PM   Modules accepted: Level of Service

## 2021-01-15 NOTE — Patient Instructions (Signed)
F/u cardiology  Bradycardia, Adult Bradycardia is a slower-than-normal heartbeat. A normal resting heart rate for an adult ranges from 60 to 100 beats per minute. With bradycardia, the restingheart rate is less than 60 beats per minute. Bradycardia can prevent enough oxygen from reaching certain areas of your body when you are active. It can be serious if it keeps enough oxygen from reaching your brain and other parts of your body. Bradycardia is not a problem foreveryone. For some healthy adults, a slow resting heart rate is normal. What are the causes? This condition may be caused by: A problem with the heart, including: A problem with the heart's electrical system, such as a heart block. With a heart block, electrical signals between the chambers of the heart are partially or completely blocked, so they are not able to work as they should. A problem with the heart's natural pacemaker (sinus node). Heart disease. A heart attack. Heart damage. Lyme disease. A heart infection. A heart condition that is present at birth (congenital heart defect). Certain medicines that treat heart conditions. Certain conditions, such as hypothyroidism and obstructive sleep apnea. Problems with the balance of chemicals and other substances, like potassium, in the blood. Trauma. Radiation therapy. What increases the risk? You are more likely to develop this condition if you: Are age 50 or older. Have high blood pressure (hypertension), high cholesterol (hyperlipidemia), or diabetes. Drink heavily, use tobacco or nicotine products, or use drugs. What are the signs or symptoms? Symptoms of this condition include: Light-headedness. Feeling faint or fainting. Fatigue and weakness. Trouble with activity or exercise. Shortness of breath. Chest pain (angina). Drowsiness. Confusion. Dizziness. How is this diagnosed? This condition may be diagnosed based on: Your symptoms. Your medical history. A  physical exam. During the exam, your health care provider will listen to your heartbeat and check your pulse. To confirm the diagnosis, your health care provider may order tests, such as: Blood tests. An electrocardiogram (ECG). This test records the heart's electrical activity. The test can show how fast your heart is beating and whether the heartbeat is steady. A test in which you wear a portable device (event recorder or Holter monitor) to record your heart's electrical activity while you go about your day. An exercise test. How is this treated? Treatment for this condition depends on the cause of the condition and how severe your symptoms are. Treatment may involve: Treatment of the underlying condition. Changing your medicines or how much medicine you take. Having a small, battery-operated device called a pacemaker implanted under the skin. When bradycardia occurs, this device can be used to increase your heart rate and help your heart beat in a regular rhythm. Follow these instructions at home: Lifestyle  Manage any health conditions that contribute to bradycardia as told by your health care provider. Follow a heart-healthy diet. A nutrition specialist (dietitian) can help educate you about healthy food options and changes. Follow an exercise program that is approved by your health care provider. Maintain a healthy weight. Try to reduce or manage your stress, such as with yoga or meditation. If you need help reducing stress, ask your health care provider. Do not use any products that contain nicotine or tobacco, such as cigarettes, e-cigarettes, and chewing tobacco. If you need help quitting, ask your health care provider. Do not use illegal drugs. Limit alcohol intake to no more than 1 drink a day for nonpregnant women and 2 drinks a day for men. Be aware of how much alcohol is in  your drink. In the [place] one drink equals one 12 oz bottle of beer (355 mL), one 5 oz glass of wine (148 mL),  or one 1 oz glass of hard liquor (44 mL).  General instructions Take over-the-counter and prescription medicines only as told by your health care provider. Keep all follow-up visits as told by your health care provider. This is important. How is this prevented? In some cases, bradycardia may be prevented by: Treating underlying medical problems. Stopping behaviors or medicines that can trigger the condition. Contact a health care provider if you: Feel light-headed or dizzy. Almost faint. Feel weak or are easily fatigued during physical activity. Experience confusion or have memory problems. Get help right away if: You faint. You have: An irregular heartbeat (palpitations). Chest pain. Trouble breathing. Summary Bradycardia is a slower-than-normal heartbeat. With bradycardia, the resting heart rate is less than 60 beats per minute. Treatment for this condition depends on the cause. Manage any health conditions that contribute to bradycardia as told by your health care provider. Do not use any products that contain nicotine or tobacco, such as cigarettes, e-cigarettes, and chewing tobacco, and limit alcohol intake. Keep all follow-up visits as told by your health care provider. This is important. This information is not intended to replace advice given to you by your health care provider. Make sure you discuss any questions you have with your healthcare provider. Document Revised: 01/17/2018 Document Reviewed: 12/15/2017 Elsevier Patient Education  [place].

## 2021-01-15 NOTE — Progress Notes (Signed)
Orthostatics   BP supine (x 5 minutes) -- 146/74  HR supine (x 5 minutes) -- 59  BP sitting -- 149/65  HR sitting -- 34HR sitting. 34. The comment is checked manually and got 35, pt denies dizziness, SOB, or chest pain, but stays his energy level is down all the time.. Taken on 01/15/21 0926  BP standing (after 1 minute) -- 152/64  HR standing (after 1 minute) -- 37  BP standing (after 3 minutes) -- 154/77  HR standing (after 3 minutes) -- 36  Orthostatics Comment -- no dizziness, CP, or SOB. Reports energy level has been down for about 2 years. Also unaware of any issues with bradycardia.

## 2021-01-15 NOTE — Telephone Encounter (Signed)
STAT if HR is under 50 or over 120 (normal HR is 60-100 beats per minute)  What is your heart rate? Down in the 30  Do you have a log of your heart rate readings (document readings)? No   Do you have any other symptoms? Palpitation he thinks.

## 2021-01-17 NOTE — Telephone Encounter (Signed)
Patient called to say say he received his monitor is wondering we can get someone to put it on. Please advise

## 2021-01-20 ENCOUNTER — Other Ambulatory Visit: Payer: Self-pay

## 2021-01-20 ENCOUNTER — Emergency Department (HOSPITAL_COMMUNITY)
Admission: EM | Admit: 2021-01-20 | Discharge: 2021-01-20 | Disposition: A | Discharge status: Home / Self Care | Payer: Medicare HMO | Attending: Emergency Medicine | Admitting: Emergency Medicine

## 2021-01-20 ENCOUNTER — Emergency Department (HOSPITAL_COMMUNITY): Payer: Medicare HMO

## 2021-01-20 DIAGNOSIS — E785 Hyperlipidemia, unspecified: Secondary | ICD-10-CM | POA: Insufficient documentation

## 2021-01-20 DIAGNOSIS — Z7982 Long term (current) use of aspirin: Secondary | ICD-10-CM | POA: Diagnosis not present

## 2021-01-20 DIAGNOSIS — R42 Dizziness and giddiness: Secondary | ICD-10-CM | POA: Diagnosis not present

## 2021-01-20 DIAGNOSIS — R001 Bradycardia, unspecified: Secondary | ICD-10-CM | POA: Insufficient documentation

## 2021-01-20 DIAGNOSIS — E1169 Type 2 diabetes mellitus with other specified complication: Secondary | ICD-10-CM | POA: Insufficient documentation

## 2021-01-20 DIAGNOSIS — Z79899 Other long term (current) drug therapy: Secondary | ICD-10-CM | POA: Diagnosis not present

## 2021-01-20 DIAGNOSIS — R0602 Shortness of breath: Secondary | ICD-10-CM | POA: Diagnosis not present

## 2021-01-20 DIAGNOSIS — R079 Chest pain, unspecified: Secondary | ICD-10-CM | POA: Diagnosis not present

## 2021-01-20 DIAGNOSIS — R008 Other abnormalities of heart beat: Secondary | ICD-10-CM | POA: Diagnosis not present

## 2021-01-20 DIAGNOSIS — I498 Other specified cardiac arrhythmias: Secondary | ICD-10-CM

## 2021-01-20 LAB — BASIC METABOLIC PANEL
Anion gap: 8 (ref 5–15)
BUN: 15 mg/dL (ref 8–23)
CO2: 25 mmol/L (ref 22–32)
Calcium: 8.2 mg/dL — ABNORMAL LOW (ref 8.9–10.3)
Chloride: 105 mmol/L (ref 98–111)
Creatinine, Ser: 1.33 mg/dL — ABNORMAL HIGH (ref 0.61–1.24)
GFR, Estimated: 54 mL/min — ABNORMAL LOW (ref >60)
Glucose, Bld: 130 mg/dL — ABNORMAL HIGH (ref 70–99)
Potassium: 3.5 mmol/L (ref 3.5–5.1)
Sodium: 138 mmol/L (ref 135–145)

## 2021-01-20 LAB — CBC
HCT: 42.5 % (ref 39.0–52.0)
Hemoglobin: 14.1 g/dL (ref 13.0–17.0)
MCH: 31.5 pg (ref 26.0–34.0)
MCHC: 33.2 g/dL (ref 30.0–36.0)
MCV: 95.1 fL (ref 80.0–100.0)
Platelets: 312 10*3/uL (ref 150–400)
RBC: 4.47 MIL/uL (ref 4.22–5.81)
RDW: 12.8 % (ref 11.5–15.5)
WBC: 9.4 10*3/uL (ref 4.0–10.5)
nRBC: 0 % (ref 0.0–0.2)

## 2021-01-20 LAB — TROPONIN I (HIGH SENSITIVITY)
Troponin I (High Sensitivity): 18 ng/L — ABNORMAL HIGH
Troponin I (High Sensitivity): 16 ng/L

## 2021-01-20 NOTE — Discharge Instructions (Addendum)
Continue medications as previously prescribed.  Wear the Holter/event monitor as previously arranged.  Avoid caffeine and alcohol intake.  Follow-up with your cardiologist/primary doctor in the next few days, and return to the ER if symptoms significantly worsen or change.

## 2021-01-20 NOTE — ED Notes (Signed)
Patient pulse rate reading as low at 29 via pulse ox. Ekg reading as bigeminy 60's to 70's

## 2021-01-20 NOTE — ED Triage Notes (Signed)
Patient complains of shortness of breath and complaints of chest pain. Patient states that hear rate has been low per doctor patient complains irregular heart rate.

## 2021-01-20 NOTE — ED Provider Notes (Addendum)
[facility] EMERGENCY DEPARTMENT Provider Note   CSN: [id number] Arrival date & time: 01/20/21  0107     History Chief Complaint  Patient presents with   Bradycardia    Douglas Keller is a 80 y.o. male.  Patient is a 80 year old male presenting with complaints of low heart rate.  Patient has been having this issue in the past and was due to have a heart monitor applied.  He had just received this in the mail, but before placing it, he began to feel worse.  He denies any chest pain or difficulty breathing.  He denies any fevers or chills.  The history is provided by the patient.      Past Medical History:  Diagnosis Date   Aortic valve sclerosis    Atherosclerosis of coronary artery without angina pectoris 07/05/2014   Cervical spondylosis without myelopathy 02/13/2013   Chest pain 01/29/2019   Degeneration of cervical intervertebral disc 02/13/2013   Depression    sometimes   Diabetes mellitus type 2, diet-controlled (HCC)    Elevated troponin    GERD (gastroesophageal reflux disease)    Heart murmur    HTN (hypertension) 07/06/2013   Hypercholesterolemia 08/21/2015   Hyperlipidemia 07/06/2013   Medication management 08/21/2015   Pre-diabetes    Rectal bleeding 09/21/2019   Stiffness of left shoulder joint 07/19/2013    Patient Active Problem List   Diagnosis Date Noted   Left knee pain 10/30/2020   Decreased GFR 07/31/2020   Spinal stenosis of lumbar region with neurogenic claudication 12/27/2019   Spinal stenosis of lumbar region 11/29/2019   Neck and shoulder pain 09/05/2019   Other insomnia 08/22/2019   Dizzy spells 08/01/2019   Depression, major, single episode, moderate ([place]) 07/07/2019   Obesity (BMI 30.0-34.9) 06/21/2019   Gastroesophageal reflux disease    Type 2 diabetes mellitus with hyperlipidemia ([place]) 09/01/2018   Heterozygous familial hypercholesterolemia 09/01/2018   Atherosclerosis of coronary artery without angina pectoris 07/05/2014   Hyperlipidemia  07/06/2013   HTN (hypertension) 07/06/2013   Spinal stenosis in cervical region 02/13/2013    Past Surgical History:  Procedure Laterality Date   BIOPSY  12/14/2019   Procedure: BIOPSY;  Surgeon: Daneil Dolin, MD;  [facility];  Service: Endoscopy;;   CERVICAL SPINE SURGERY     COLONOSCOPY  07/26/2012   Procedure: COLONOSCOPY;  Surgeon: Jamesetta So, MD;  [facility];  Service: Gastroenterology;  Laterality: N/A;   COLONOSCOPY WITH PROPOFOL N/A 12/14/2019   Procedure: COLONOSCOPY WITH PROPOFOL;  Surgeon: Daneil Dolin, MD;  [facility];  Service: Endoscopy;  Laterality: N/A;  9:00am   KNEE ARTHROSCOPY Left    torn ligament   LUMBAR LAMINECTOMY/DECOMPRESSION MICRODISCECTOMY Bilateral 12/27/2019   Procedure: BIATERAL LUMBAR TWO- LUMBAR THREE, LUMBAR THREE- LUMBAR FOUR, LUMBAR FOUR-FIVE LAMINOTOMY/FORAMINOTOMY 3 LEVELS;  Surgeon: Newman Pies, MD;  Location: [place];  Service: Neurosurgery;  Laterality: Bilateral;  POSTERIOR   SPINE SURGERY N/A    Phreesia 02/24/2020       Family History  Problem Relation Age of Onset   Asthma Mother    Hypertension Father    Colon cancer Neg Hx     Social History   Tobacco Use   Smoking status: Never   Smokeless tobacco: Never  Vaping Use   Vaping Use: Never used  Substance Use Topics   Alcohol use: No   Drug use: No    Home Medications Prior to Admission medications   Medication Sig Start  Date End Date Taking? Authorizing Provider  acetaminophen (TYLENOL) 650 MG CR tablet Take 1,300 mg by mouth every 8 (eight) hours as needed for pain.     [provider]  amLODipine (NORVASC) 10 MG tablet Take 1 tablet (10 mg total) by mouth daily. 10/30/20   Noreene Larsson, NP  aspirin EC 81 MG tablet Take 81 mg by mouth daily.    [provider]  benazepril (LOTENSIN) 40 MG tablet Take 1 tablet (40 mg total) by mouth daily. 10/30/20   Noreene Larsson, NP  buPROPion (WELLBUTRIN XL) 150 MG 24 hr  tablet Take 1 tablet (150 mg total) by mouth daily. 10/30/20   Noreene Larsson, NP  gabapentin (NEURONTIN) 300 MG capsule TAKE 1 CAPSULE THREE TIMES DAILY 10/30/20   Noreene Larsson, NP  magnesium oxide (MAG-OX) 400 MG tablet Take 400 mg by mouth daily.    [provider]  meclizine (ANTIVERT) 25 MG tablet Take 1 tablet (25 mg total) by mouth 3 (three) times daily as needed for dizziness. 12/10/20   Noreene Larsson, NP  meloxicam (MOBIC) 7.5 MG tablet Take 1 tablet (7.5 mg total) by mouth daily. 12/23/20   Carole Civil, MD  multivitamin-iron-minerals-folic acid (CENTRUM) chewable tablet Chew 1 tablet by mouth daily.    [provider]  pantoprazole (PROTONIX) 40 MG tablet Take 1 tablet (40 mg total) by mouth daily. 10/30/20   Noreene Larsson, NP  REPATHA SURECLICK 509 MG/ML SOAJ INJECT 140 MG INTO THE SKIN EVERY 14 DAY 04/01/20   Croitoru, Mihai, MD  spironolactone-hydrochlorothiazide (ALDACTAZIDE) 25-25 MG tablet Take 1 tablet by mouth daily. 12/10/20   Noreene Larsson, NP    Allergies    Metformin and related and Statins  Review of Systems   Review of Systems  All other systems reviewed and are negative.  Physical Exam Updated Vital Signs BP 127/65   Pulse (!) 58   Temp 98.3 F (36.8 C)   Resp 19   Ht 5\' 6"  (1.676 m)   Wt 93 kg   SpO2 98%   BMI 33.09 kg/m   Physical Exam Vitals and nursing note reviewed.  Constitutional:      General: He is not in acute distress.    Appearance: He is well-developed. He is not diaphoretic.  HENT:     Head: Normocephalic and atraumatic.  Cardiovascular:     Rate and Rhythm: Normal rate and regular rhythm.     Heart sounds: No murmur heard.   No friction rub.  Pulmonary:     Effort: Pulmonary effort is normal. No respiratory distress.     Breath sounds: Normal breath sounds. No wheezing or rales.  Abdominal:     General: Bowel sounds are normal. There is no distension.     Palpations: Abdomen is soft.     Tenderness: There  is no abdominal tenderness.  Musculoskeletal:        General: Normal range of motion.     Cervical back: Normal range of motion and neck supple.  Skin:    General: Skin is warm and dry.  Neurological:     Mental Status: He is alert and oriented to person, place, and time.     Coordination: Coordination normal.    ED Results / Procedures / Treatments   Labs (all labs ordered are listed, but only abnormal results are displayed) Labs Reviewed  BASIC METABOLIC PANEL - Abnormal; Notable for the following components:  Result Value   Glucose, Bld 130 (*)    Creatinine, Ser 1.33 (*)    Calcium 8.2 (*)    GFR, Estimated 54 (*)    All other components within normal limits  TROPONIN I (HIGH SENSITIVITY) - Abnormal; Notable for the following components:   Troponin I (High Sensitivity) 18 (*)    All other components within normal limits  CBC  TROPONIN I (HIGH SENSITIVITY)    EKG EKG Interpretation  Date/Time:  Monday January 20 2021 01:26:05 EDT Ventricular Rate:  87 PR Interval:  173 QRS Duration: 106 QT Interval:  428 QTC Calculation: 387 R Axis:   64 Text Interpretation: Sinus rhythm Ventricular bigeminy Probable anteroseptal infarct, old Borderline ST depression, anterolateral leads Borderline ST elevation, inferior leads Baseline wander in lead(s) V2 Confirmed by Veryl Speak [phone]) on 01/20/2021 5:23:33 AM  Radiology DG Chest 2 View  Result Date: 01/20/2021 CLINICAL DATA:  Shortness of breath and chest pain. EXAM: CHEST - 2 VIEW COMPARISON:  12/13/2020 FINDINGS: Shallow inspiration. Mild cardiac enlargement. No vascular congestion, edema, or consolidation. No pleural effusions. No pneumothorax. Mediastinal contours appear intact. Degenerative changes in the spine. Postoperative change in the cervical spine. IMPRESSION: Cardiac enlargement.  No evidence of active pulmonary disease. Electronically Signed   By: Lucienne Capers M.D.   On: 01/20/2021 01:58    Procedures Procedures    Medications Ordered in ED Medications - No data to display  ED Course  I have reviewed the triage vital signs and the nursing notes.  Pertinent labs & imaging results that were available during my care of the patient were reviewed by me and considered in my medical decision making (see chart for details).    MDM Rules/Calculators/A&P  Patient is a 80 year old male with past medical history of hypertension, diabetes, GERD.  He presents today for evaluation of shortness of breath and low heart rate he has been seen by his cardiologist with similar complaints.  Just received a monitor in the mail so today.  Before he had a chance to put it on, he again noticed weakness, fatigue, and low heart rate and presents for evaluation of this.  Patient's initial EKG shows sinus rhythm with ventricular bigeminy, but laboratory studies that are otherwise unremarkable.  I suspect the bigeminy is the cause of his symptoms, but he appears otherwise stable.  During my conversation with him, he has done in and out of bigeminy and into sinus rhythm with consecutive sinus beats.  At this point, I feel as though patient can safely follow-up with his cardiologist.  He is to wear his event monitor as previously arranged.  Final Clinical Impression(s) / ED Diagnoses Final diagnoses:  None    Rx / DC Orders ED Discharge Orders     None        Veryl Speak, MD 01/20/21 [id number]    Veryl Speak, MD 02/04/21 [phone]

## 2021-01-28 ENCOUNTER — Ambulatory Visit (INDEPENDENT_AMBULATORY_CARE_PROVIDER_SITE_OTHER): Payer: Medicare HMO | Admitting: Nurse Practitioner

## 2021-01-28 ENCOUNTER — Other Ambulatory Visit: Payer: Self-pay | Admitting: Nurse Practitioner

## 2021-01-28 ENCOUNTER — Encounter: Payer: Self-pay | Admitting: Nurse Practitioner

## 2021-01-28 ENCOUNTER — Other Ambulatory Visit: Payer: Self-pay

## 2021-01-28 VITALS — BP 162/90 | HR 56 | Temp 98.4°F | Ht 66.0 in | Wt 209.0 lb

## 2021-01-28 DIAGNOSIS — I10 Essential (primary) hypertension: Secondary | ICD-10-CM

## 2021-01-28 DIAGNOSIS — M4802 Spinal stenosis, cervical region: Secondary | ICD-10-CM | POA: Diagnosis not present

## 2021-01-28 DIAGNOSIS — E1169 Type 2 diabetes mellitus with other specified complication: Secondary | ICD-10-CM

## 2021-01-28 DIAGNOSIS — E785 Hyperlipidemia, unspecified: Secondary | ICD-10-CM | POA: Diagnosis not present

## 2021-01-28 DIAGNOSIS — G4709 Other insomnia: Secondary | ICD-10-CM

## 2021-01-28 DIAGNOSIS — R5382 Chronic fatigue, unspecified: Secondary | ICD-10-CM

## 2021-01-28 MED ORDER — TRAZODONE HCL 50 MG PO TABS: 25.0000 mg | ORAL_TABLET | Freq: Every evening | ORAL | 3 refills | Status: DC | PRN

## 2021-01-28 MED ORDER — BENAZEPRIL HCL 40 MG PO TABS: 40.0000 mg | ORAL_TABLET | Freq: Two times a day (BID) | ORAL | 1 refills | Status: DC

## 2021-01-28 NOTE — Assessment & Plan Note (Signed)
-  Rx. Trazodone;  -discussed using 1 tab per night as needed; if groggy can cut in half; if not effective, try taking 2; let us know if 2 works, and I will change the rx

## 2021-01-28 NOTE — Progress Notes (Signed)
Established Patient Office Visit  Subjective:  [name], male    DOB: 1940-11-15  Age: 80 y.o. MRN: [id number]  CC:  Chief Complaint  Patient presents with   Hypertension    Follow up    HPI Douglas Keller presents for follow-up for HTN. At his last OV on 5/24, his BP was elevated so we started spironolactone-HCTZ 25/25.  He states that he has severe insomnia.  He states he has tried 3-4 different medications for sleep.  Past Medical History:  Diagnosis Date   Aortic valve sclerosis    Atherosclerosis of coronary artery without angina pectoris 07/05/2014   Cervical spondylosis without myelopathy 02/13/2013   Chest pain 01/29/2019   Degeneration of cervical intervertebral disc 02/13/2013   Depression    sometimes   Diabetes mellitus type 2, diet-controlled (HCC)    Elevated troponin    GERD (gastroesophageal reflux disease)    Heart murmur    HTN (hypertension) 07/06/2013   Hypercholesterolemia 08/21/2015   Hyperlipidemia 07/06/2013   Medication management 08/21/2015   Pre-diabetes    Rectal bleeding 09/21/2019   Stiffness of left shoulder joint 07/19/2013    Past Surgical History:  Procedure Laterality Date   BIOPSY  12/14/2019   Procedure: BIOPSY;  Surgeon: Daneil Dolin, MD;  [facility];  Service: Endoscopy;;   CERVICAL SPINE SURGERY     COLONOSCOPY  07/26/2012   Procedure: COLONOSCOPY;  Surgeon: Jamesetta So, MD;  [facility];  Service: Gastroenterology;  Laterality: N/A;   COLONOSCOPY WITH PROPOFOL N/A 12/14/2019   Procedure: COLONOSCOPY WITH PROPOFOL;  Surgeon: Daneil Dolin, MD;  [facility];  Service: Endoscopy;  Laterality: N/A;  9:00am   KNEE ARTHROSCOPY Left    torn ligament   LUMBAR LAMINECTOMY/DECOMPRESSION MICRODISCECTOMY Bilateral 12/27/2019   Procedure: BIATERAL LUMBAR TWO- LUMBAR THREE, LUMBAR THREE- LUMBAR FOUR, LUMBAR FOUR-FIVE LAMINOTOMY/FORAMINOTOMY 3 LEVELS;  Surgeon: Newman Pies, MD;   Location: [place];  Service: Neurosurgery;  Laterality: Bilateral;  POSTERIOR   SPINE SURGERY N/A    Phreesia 02/24/2020    Family History  Problem Relation Age of Onset   Asthma Mother    Hypertension Father    Colon cancer Neg Hx     Social History   Socioeconomic History   Marital status: Widowed    Spouse name: Cherly    Number of children: 1   Years of education: Not on file   Highest education level: 12th grade  Occupational History    Comment: Retired   Tobacco Use   Smoking status: Never   Smokeless tobacco: Never  Vaping Use   Vaping Use: Never used  Substance and Sexual Activity   Alcohol use: No   Drug use: No   Sexual activity: Not Currently  Other Topics Concern   Not on file  Social History Narrative   Live alone right now, wife passed in 2009    [place] daughter-grandchild 2 live in [place]      Enjoys playing golf      Diet: eats what he likes, sweet, fast food, does not eat a lot of canned or boxed foods.    Does not eat enough fruits and veggies.       Caffeine: 1 cup daily   Water: 3-4 bottles of water daily, Gatorade every now and then         Wears seat belt   Wears sun protection when golfing   Smoke detectors at home  Does not use phone while driving   Social Determinants of Health   Financial Resource Strain: Low Risk    Difficulty of Paying Living Expenses: Not hard at all  Food Insecurity: No Food Insecurity   Worried About Charity fundraiser in the Last Year: Never true   [place] in the Last Year: Never true  Transportation Needs: No Transportation Needs   Lack of Transportation (Medical): No   Lack of Transportation (Non-Medical): No  Physical Activity: Insufficiently Active   Days of Exercise per Week: 2 days   Minutes of Exercise per Session: 10 min  Stress: No Stress Concern Present   Feeling of Stress : Not at all  Social Connections: Moderately Isolated   Frequency of Communication with Friends and Family:  More than three times a week   Frequency of Social Gatherings with Friends and Family: More than three times a week   Attends Religious Services: More than 4 times per year   Active Member of [employer] or Organizations: No   Attends Archivist Meetings: Never   Marital Status: Widowed  Intimate Partner Violence: Not on file    Outpatient Medications Prior to Visit  Medication Sig Dispense Refill   acetaminophen (TYLENOL) 650 MG CR tablet Take 1,300 mg by mouth every 8 (eight) hours as needed for pain.      amLODipine (NORVASC) 10 MG tablet Take 1 tablet (10 mg total) by mouth daily. 90 tablet 1   aspirin EC 81 MG tablet Take 81 mg by mouth daily.     buPROPion (WELLBUTRIN XL) 150 MG 24 hr tablet Take 1 tablet (150 mg total) by mouth daily. 90 tablet 2   gabapentin (NEURONTIN) 300 MG capsule TAKE 1 CAPSULE THREE TIMES DAILY 90 capsule 0   magnesium oxide (MAG-OX) 400 MG tablet Take 400 mg by mouth daily.     meclizine (ANTIVERT) 25 MG tablet Take 1 tablet (25 mg total) by mouth 3 (three) times daily as needed for dizziness. 30 tablet 0   meloxicam (MOBIC) 7.5 MG tablet Take 1 tablet (7.5 mg total) by mouth daily. 90 tablet 5   multivitamin-iron-minerals-folic acid (CENTRUM) chewable tablet Chew 1 tablet by mouth daily.     pantoprazole (PROTONIX) 40 MG tablet Take 1 tablet (40 mg total) by mouth daily. 90 tablet 2   REPATHA SURECLICK 694 MG/ML SOAJ INJECT 140 MG INTO THE SKIN EVERY 14 DAY 2 mL 11   spironolactone-hydrochlorothiazide (ALDACTAZIDE) 25-25 MG tablet Take 1 tablet by mouth daily. 90 tablet 1   benazepril (LOTENSIN) 40 MG tablet Take 1 tablet (40 mg total) by mouth daily. 90 tablet 1   No facility-administered medications prior to visit.    Allergies  Allergen Reactions   Metformin And Related Diarrhea and Nausea And Vomiting   Statins Other (See Comments)    Muscle/joint pain     ROS Review of Systems  Constitutional:  Positive for fatigue. Negative for chills  and fever.  Respiratory: Negative.    Cardiovascular: Negative.   Psychiatric/Behavioral:  Positive for sleep disturbance. Negative for self-injury and suicidal ideas.      Objective:    Physical Exam Constitutional:      Appearance: Normal appearance.  Cardiovascular:     Rate and Rhythm: Bradycardia present. Rhythm irregular.     Pulses: Normal pulses.     Heart sounds: Normal heart sounds.  Pulmonary:     Effort: Pulmonary effort is normal.     Breath sounds:  Normal breath sounds.  Neurological:     Mental Status: He is alert.  Psychiatric:        Mood and Affect: Mood normal.        Behavior: Behavior normal.        Thought Content: Thought content normal.        Judgment: Judgment normal.    BP (!) 162/90 (BP Location: Left Arm, Patient Position: Sitting, Cuff Size: Large)   Pulse (!) 56   Temp 98.4 F (36.9 C) (Temporal)   Ht _0  (1.676 m)   Wt 209 lb (94.8 kg)   SpO2 96%   BMI 33.73 kg/m  Wt Readings from Last 3 Encounters:  01/28/21 209 lb (94.8 kg)  01/20/21 205 lb (93 kg)  01/15/21 210 lb (95.3 kg)     Health Maintenance Due  Topic Date Due   FOOT EXAM  05/10/2020   PNA vac Low Risk Adult (2 of 2 - PCV13) 11/28/2020    There are no preventive care reminders to display for this patient.  Lab Results  Component Value Date   TSH 1.670 05/14/2020   Lab Results  Component Value Date   WBC 9.4 01/20/2021   HGB 14.1 01/20/2021   HCT 42.5 01/20/2021   MCV 95.1 01/20/2021   PLT 312 01/20/2021   Lab Results  Component Value Date   NA 138 01/20/2021   K 3.5 01/20/2021   CO2 25 01/20/2021   GLUCOSE 130 (H) 01/20/2021   BUN 15 01/20/2021   CREATININE 1.33 (H) 01/20/2021   BILITOT 0.6 10/31/2020   ALKPHOS 88 10/31/2020   AST 22 10/31/2020   ALT 15 10/31/2020   PROT 6.9 10/31/2020   ALBUMIN 4.2 10/31/2020   CALCIUM 8.2 (L) 01/20/2021   ANIONGAP 8 01/20/2021   EGFR 58 (L) 10/31/2020   Lab Results  Component Value Date   CHOL 122  10/31/2020   Lab Results  Component Value Date   HDL 45 10/31/2020   Lab Results  Component Value Date   LDLCALC 54 10/31/2020   Lab Results  Component Value Date   TRIG 131 10/31/2020   Lab Results  Component Value Date   CHOLHDL 2.9 08/01/2020   Lab Results  Component Value Date   HGBA1C 6.4 (H) 10/31/2020      Assessment & Plan:   Problem List Items Addressed This Visit       Cardiovascular and Mediastinum   HTN (hypertension) - Primary    BP Readings from Last 3 Encounters:  01/28/21 (!) 162/90  01/20/21 (!) 142/112  01/15/21 (!) 146/74  -BP elevated today, and it has been elevated at his last few OVs -taking amlodipine, benazepril, and spironolactone-HCTZ -INCREASE benazepril to BID       Relevant Medications   benazepril (LOTENSIN) 40 MG tablet   Other Relevant Orders   CBC with Differential/Platelet   CMP14+EGFR   Lipid Panel With LDL/HDL Ratio     Endocrine   Type 2 diabetes mellitus with hyperlipidemia (HCC)   Relevant Medications   benazepril (LOTENSIN) 40 MG tablet   Other Relevant Orders   Hemoglobin A1c     Other   Spinal stenosis in cervical region   Other insomnia    -Rx. Trazodone;  -discussed using 1 tab per night as needed; if groggy can cut in half; if not effective, try taking 2; let us know if 2 works, and I will change the rx       Relevant Medications  traZODone (DESYREL) 50 MG tablet   Other Visit Diagnoses     Chronic fatigue       Relevant Orders   CBC with Differential/Platelet   CMP14+EGFR   TSH   VITAMIN D 25 Hydroxy (Vit-D Deficiency, Fractures)       Meds ordered this encounter  Medications   benazepril (LOTENSIN) 40 MG tablet    Sig: Take 1 tablet (40 mg total) by mouth 2 (two) times daily.    Dispense:  180 tablet    Refill:  1   traZODone (DESYREL) 50 MG tablet    Sig: Take 0.5-1 tablets (25-50 mg total) by mouth at bedtime as needed for sleep.    Dispense:  30 tablet    Refill:  3     Follow-up: Return in about 1 month (around 02/28/2021) for Lab follow-up (HTN, DM, insomnia).    Noreene Larsson, NP

## 2021-01-28 NOTE — Patient Instructions (Signed)
Please have fasting labs drawn 2-3 days prior to your appointment so we can discuss the results during your office visit.  

## 2021-01-28 NOTE — Telephone Encounter (Signed)
Patient had gone to [facility] to have ZIO XT monitor applied. Instructed patient to remove ZIO patch next Monday, stick patch to last page of patient log book, put log book in blue and white box , tape box shut and drop in mailbox.  Box has prepaid [employer] on it.  Dr. Sallyanne Kuster should have monitor results within 7 days of mailing monitor back to [facility].

## 2021-01-28 NOTE — Assessment & Plan Note (Signed)
BP Readings from Last 3 Encounters:  01/28/21 (!) 162/90  01/20/21 (!) 142/112  01/15/21 (!) 146/74   -BP elevated today, and it has been elevated at his last few OVs -taking amlodipine, benazepril, and spironolactone-HCTZ -INCREASE benazepril to BID

## 2021-01-29 ENCOUNTER — Other Ambulatory Visit: Payer: Medicare HMO

## 2021-01-29 ENCOUNTER — Ambulatory Visit: Payer: Medicare HMO | Admitting: Nurse Practitioner

## 2021-02-06 DIAGNOSIS — R42 Dizziness and giddiness: Secondary | ICD-10-CM | POA: Diagnosis not present

## 2021-02-06 DIAGNOSIS — R001 Bradycardia, unspecified: Secondary | ICD-10-CM | POA: Diagnosis not present

## 2021-02-12 ENCOUNTER — Ambulatory Visit
Admission: RE | Admit: 2021-02-12 | Discharge: 2021-02-12 | Disposition: A | Discharge status: Home / Self Care | Payer: Medicare HMO | Source: Ambulatory Visit | Attending: Neurology | Admitting: Neurology

## 2021-02-12 ENCOUNTER — Other Ambulatory Visit: Payer: Self-pay

## 2021-02-12 DIAGNOSIS — H9190 Unspecified hearing loss, unspecified ear: Secondary | ICD-10-CM

## 2021-02-12 DIAGNOSIS — R42 Dizziness and giddiness: Secondary | ICD-10-CM | POA: Diagnosis not present

## 2021-02-12 DIAGNOSIS — R55 Syncope and collapse: Secondary | ICD-10-CM | POA: Diagnosis not present

## 2021-02-12 DIAGNOSIS — H9313 Tinnitus, bilateral: Secondary | ICD-10-CM

## 2021-02-12 MED ORDER — GADOBENATE DIMEGLUMINE 529 MG/ML IV SOLN
18.0000 mL | Freq: Once | INTRAVENOUS | Status: AC | PRN
  Administered 2021-02-12: 18 mL via INTRAVENOUS

## 2021-02-19 ENCOUNTER — Ambulatory Visit: Payer: Medicare HMO | Admitting: Cardiovascular Disease

## 2021-02-19 ENCOUNTER — Other Ambulatory Visit: Payer: Self-pay

## 2021-02-19 ENCOUNTER — Encounter: Payer: Self-pay | Admitting: Cardiovascular Disease

## 2021-02-19 VITALS — BP 126/62 | HR 54 | Ht 66.0 in | Wt 209.0 lb

## 2021-02-19 DIAGNOSIS — N1831 Chronic kidney disease, stage 3a: Secondary | ICD-10-CM

## 2021-02-19 DIAGNOSIS — I5042 Chronic combined systolic (congestive) and diastolic (congestive) heart failure: Secondary | ICD-10-CM | POA: Diagnosis not present

## 2021-02-19 DIAGNOSIS — E7801 Familial hypercholesterolemia: Secondary | ICD-10-CM | POA: Diagnosis not present

## 2021-02-19 DIAGNOSIS — N179 Acute kidney failure, unspecified: Secondary | ICD-10-CM | POA: Diagnosis not present

## 2021-02-19 DIAGNOSIS — E669 Obesity, unspecified: Secondary | ICD-10-CM | POA: Diagnosis not present

## 2021-02-19 DIAGNOSIS — I10 Essential (primary) hypertension: Secondary | ICD-10-CM

## 2021-02-19 DIAGNOSIS — M48062 Spinal stenosis, lumbar region with neurogenic claudication: Secondary | ICD-10-CM | POA: Diagnosis not present

## 2021-02-19 DIAGNOSIS — I2510 Atherosclerotic heart disease of native coronary artery without angina pectoris: Secondary | ICD-10-CM | POA: Diagnosis not present

## 2021-02-19 DIAGNOSIS — I493 Ventricular premature depolarization: Secondary | ICD-10-CM

## 2021-02-19 DIAGNOSIS — E1169 Type 2 diabetes mellitus with other specified complication: Secondary | ICD-10-CM | POA: Diagnosis not present

## 2021-02-19 MED ORDER — BENAZEPRIL HCL 40 MG PO TABS: 40.0000 mg | ORAL_TABLET | Freq: Every day | ORAL | 1 refills | Status: DC

## 2021-02-19 MED ORDER — METOPROLOL SUCCINATE ER 25 MG PO TB24: 25.0000 mg | ORAL_TABLET | Freq: Every day | ORAL | 1 refills | Status: DC

## 2021-02-19 MED ORDER — SPIRONOLACTONE-HCTZ 25-25 MG PO TABS: 1.0000 | ORAL_TABLET | ORAL | 1 refills | Status: DC

## 2021-02-19 MED ORDER — REPATHA SURECLICK 140 MG/ML ~~LOC~~ SOAJ: SUBCUTANEOUS | 11 refills | Status: DC

## 2021-02-19 NOTE — Progress Notes (Signed)
[name], male   DOB: [birth year], 80 y.o.   MRN: [id number]    Cardiology Office Note    Date:  02/19/2021   ID:  Douglas Keller, DOB February 23, 1941, MRN [id number]  PCP:  Noreene Larsson, NP  Cardiologist:   Sanda Klein, MD   Chief Complaint  Patient presents with   Follow-up   Bradycardia   Fatigue   Shortness of Breath    Some.    History of Present Illness:  Douglas Keller is a 80 y.o. male with hyperlipidemia, hypertension, moderate coronary atherosclerosis and a murmur due to aortic valve sclerosis without stenosis.  Recently has had a lot of problems with dizziness.  This led to an emergency room evaluation on July 4.  He was found to be in ventricular bigeminy.  Potassium was low normal at 3.5.  Troponin was normal.  Subsequently wore an event monitor that shows extremely frequent PVCs making up almost 16% of all QRS complexes including brief episodes of nonsustained VT, bigeminy and trigeminy.    He has not had full-blown syncope.  He denies orthopnea, PND and leg edema.  He has not had chest pain at rest or with activity and denies any focal neurological complaints.  Blood pressure control is very good today.  He was recently told to double his dose of benazepril when his blood pressure was recorded at 162/90, but did not do so because otherwise his blood pressure has been very normal (including today).Marland Kitchen  He did not tolerate treatment with chlorthalidone due to severe muscle cramps, but is doing well on the spironolactone-hydrochlorothiazide combination.  Most recent potassium was 4.6 and creatinine was 1.46.  Furosemide has been started last fall due to some weight gain and exertional dyspnea, but was discontinued due to worsening renal function.    An event monitor in November 2018 showed relative bradycardia and a suggestion of chronotropic incompetence.  He did not have any symptoms and a pacemaker was not necessary.  His echocardiogram in 2020 showed  borderline LVEF of 45-50% (similar to EF 48% by nuclear stress test in 2016) and impaired relaxation  He was intolerant of multiple statins even when we tried to supplement coenzyme Q 10.  He is now on Repatha and tolerating it very well, with excellent LDL level.  Cardiac catheterization in 2006 showed  non obstructive atherosclerosis (40% proximal LAD, 40% mid RCA) and had nuclear stress test in 2018 showed normal perfusion, LVEF was only 48% .  Normal ABI bilaterally February 2020  Past Medical History:  Diagnosis Date   Aortic valve sclerosis    Atherosclerosis of coronary artery without angina pectoris 07/05/2014   Cervical spondylosis without myelopathy 02/13/2013   Chest pain 01/29/2019   Degeneration of cervical intervertebral disc 02/13/2013   Depression    sometimes   Diabetes mellitus type 2, diet-controlled (HCC)    Elevated troponin    GERD (gastroesophageal reflux disease)    Heart murmur    HTN (hypertension) 07/06/2013   Hypercholesterolemia 08/21/2015   Hyperlipidemia 07/06/2013   Medication management 08/21/2015   Pre-diabetes    Rectal bleeding 09/21/2019   Stiffness of left shoulder joint 07/19/2013    Past Surgical History:  Procedure Laterality Date   BIOPSY  12/14/2019   Procedure: BIOPSY;  Surgeon: Daneil Dolin, MD;  [facility];  Service: Endoscopy;;   CERVICAL SPINE SURGERY     COLONOSCOPY  07/26/2012   Procedure: COLONOSCOPY;  Surgeon: Jamesetta So, MD;  [facility];  Service: Gastroenterology;  Laterality: N/A;   COLONOSCOPY WITH PROPOFOL N/A 12/14/2019   Procedure: COLONOSCOPY WITH PROPOFOL;  Surgeon: Daneil Dolin, MD;  [facility];  Service: Endoscopy;  Laterality: N/A;  9:00am   KNEE ARTHROSCOPY Left    torn ligament   LUMBAR LAMINECTOMY/DECOMPRESSION MICRODISCECTOMY Bilateral 12/27/2019   Procedure: BIATERAL LUMBAR TWO- LUMBAR THREE, LUMBAR THREE- LUMBAR FOUR, LUMBAR FOUR-FIVE LAMINOTOMY/FORAMINOTOMY 3 LEVELS;   Surgeon: Newman Pies, MD;  Location: [facility];  Service: Neurosurgery;  Laterality: Bilateral;  POSTERIOR   SPINE SURGERY N/A    Phreesia 02/24/2020    Outpatient Medications Prior to Visit  Medication Sig Dispense Refill   acetaminophen (TYLENOL) 650 MG CR tablet Take 1,300 mg by mouth every 8 (eight) hours as needed for pain.      amLODipine (NORVASC) 10 MG tablet Take 1 tablet (10 mg total) by mouth daily. 90 tablet 1   aspirin EC 81 MG tablet Take 81 mg by mouth daily.     buPROPion (WELLBUTRIN XL) 150 MG 24 hr tablet Take 1 tablet (150 mg total) by mouth daily. 90 tablet 2   gabapentin (NEURONTIN) 300 MG capsule TAKE 1 CAPSULE THREE TIMES DAILY 90 capsule 0   magnesium oxide (MAG-OX) 400 MG tablet Take 400 mg by mouth daily.     meclizine (ANTIVERT) 25 MG tablet Take 1 tablet (25 mg total) by mouth 3 (three) times daily as needed for dizziness. 30 tablet 0   meloxicam (MOBIC) 7.5 MG tablet Take 1 tablet (7.5 mg total) by mouth daily. 90 tablet 5   multivitamin-iron-minerals-folic acid (CENTRUM) chewable tablet Chew 1 tablet by mouth daily.     pantoprazole (PROTONIX) 40 MG tablet Take 1 tablet (40 mg total) by mouth daily. 90 tablet 2   traZODone (DESYREL) 50 MG tablet Take 0.5-1 tablets (25-50 mg total) by mouth at bedtime as needed for sleep. 30 tablet 3   benazepril (LOTENSIN) 40 MG tablet Take 1 tablet (40 mg total) by mouth 2 (two) times daily. 180 tablet 1   REPATHA SURECLICK [id number] MG/ML SOAJ INJECT 140 MG INTO THE SKIN EVERY 14 DAY 2 mL 11   spironolactone-hydrochlorothiazide (ALDACTAZIDE) 25-25 MG tablet Take 1 tablet by mouth daily. 90 tablet 1   No facility-administered medications prior to visit.     Allergies:   Metformin and related and Statins   Social History   Socioeconomic History   Marital status: Widowed    Spouse name: Cherly    Number of children: 1   Years of education: Not on file   Highest education level: 12th grade  Occupational History    Comment:  Retired   Tobacco Use   Smoking status: Never   Smokeless tobacco: Never  Vaping Use   Vaping Use: Never used  Substance and Sexual Activity   Alcohol use: No   Drug use: No   Sexual activity: Not Currently  Other Topics Concern   Not on file  Social History Narrative   Live alone right now, wife passed in 2009    [place] daughter-grandchild 2 live in [place]      Enjoys playing golf      Diet: eats what he likes, sweet, fast food, does not eat a lot of canned or boxed foods.    Does not eat enough fruits and veggies.       Caffeine: 1 cup daily   Water: 3-4 bottles of water daily, Gatorade every now and then  Wears seat belt   Wears sun protection when golfing   Smoke detectors at home   Does not use phone while driving   Social Determinants of Health   Financial Resource Strain: Low Risk    Difficulty of Paying Living Expenses: Not hard at all  Food Insecurity: No Food Insecurity   Worried About Charity fundraiser in the Last Year: Never true   Arboriculturist in the Last Year: Never true  Transportation Needs: No Transportation Needs   Lack of Transportation (Medical): No   Lack of Transportation (Non-Medical): No  Physical Activity: Insufficiently Active   Days of Exercise per Week: 2 days   Minutes of Exercise per Session: 10 min  Stress: No Stress Concern Present   Feeling of Stress : Not at all  Social Connections: Moderately Isolated   Frequency of Communication with Friends and Family: More than three times a week   Frequency of Social Gatherings with Friends and Family: More than three times a week   Attends Religious Services: More than 4 times per year   Active Member of [employer] or Organizations: No   Attends Archivist Meetings: Never   Marital Status: Widowed     Family History:  The patient's family history includes Asthma in his mother; Hypertension in his father.   ROS:   Please see the history of present illness.    All  other systems are reviewed and are negative.  PHYSICAL EXAM:   VS:  BP 126/62 (BP Location: Left Arm, Patient Position: Sitting, Cuff Size: Normal)   Pulse (!) 54   Ht '5\' 6"'$  (1.676 m)   Wt 209 lb (94.8 kg)   BMI 33.73 kg/m      General: Alert, oriented x3, no distress, moderately obese Head: no evidence of trauma, PERRL, EOMI, no exophtalmos or lid lag, no myxedema, no xanthelasma; normal ears, nose and oropharynx Neck: normal jugular venous pulsations and no hepatojugular reflux; brisk carotid pulses without delay and no carotid bruits Chest: clear to auscultation, no signs of consolidation by percussion or palpation, normal fremitus, symmetrical and full respiratory excursions Cardiovascular: normal position and quality of the apical impulse, regular rhythm, normal first and second heart sounds, 1/6 early peaking aortic ejection murmur, no diastolic murmurs, rubs or gallops Abdomen: no tenderness or distention, no masses by palpation, no abnormal pulsatility or arterial bruits, normal bowel sounds, no hepatosplenomegaly Extremities: no clubbing, cyanosis or edema; 2+ radial, ulnar and brachial pulses bilaterally; 2+ right femoral, posterior tibial and dorsalis pedis pulses; 2+ left femoral, posterior tibial and dorsalis pedis pulses; no subclavian or femoral bruits Neurological: grossly nonfocal Psych: Normal mood and affect    Wt Readings from Last 3 Encounters:  02/19/21 209 lb (94.8 kg)  01/28/21 209 lb (94.8 kg)  01/20/21 205 lb (93 kg)      Studies/Labs Reviewed:   EKG:  EKG is not ordered today.  Personally reviewed the tracing from his ER visit on 01/20/2021.  This shows sinus rhythm with ventricular bigeminy (monomorphic PVCs).  There appears to be an RBBB pattern in V1 and V2 with secondary polarization abnormalities.  14-day event monitor 02/06/2021 The dominant rhythm was normal sinus with a normal circadian rhythm variation. There are extremely frequent premature  ventricular beats (representing 15.7% of all QRS complexes), with frequent ventricular couplets and rare episodes of 4-7 beat runs of nonsustained ventricular tachycardia. Although multiple PVC morphologies are seen, there is a dominant PVC morphology and occasional bigeminy  and trigeminy are seen There is a remarkable reduction, almost complete abolition of ventricular ectopy during sleep Atrial ectopy is rare and there is no atrial fibrillation. A 5 beat run of atrial tachycardia seen. Patient diary was not submitted. There is mild expected bradycardia during sleep. There are no episodes of severe bradycardia or sinus pauses.   Abnormal 14-day event monitor due to the presence of extremely frequent PVCs and ventricular couplets, occasionally in the form of very brief nonsustained VT. There is a remarkable reduction in ventricular ectopy burden during sleep.  Echocardiogram 05/17/2020: 1. Left ventricular ejection fraction, by estimation, is 55%. The left ventricle has normal function. The left ventricle has no regional wall motion abnormalities. There is mild left ventricular hypertrophy. Left ventricular diastolic parameters are consistent with Grade I diastolic dysfunction (impaired relaxation). Elevated left atrial pressure. 2. Right ventricular systolic function is normal. The right ventricular size is normal. 3. The mitral valve is normal in structure. No evidence of mitral valve regurgitation. No evidence of mitral stenosis. 4. The aortic valve is tricuspid. There is mild calcification of the aortic valve. There is mild thickening of the aortic valve. Aortic valve regurgitation is mild. No aortic stenosis is present. 5. The inferior vena cava is normal in size with greater than 50% respiratory variability,suggesting right atrial pressure of 3 mmHg.  Nuclear stress test 01/30/2019:  There was no ST segment deviation noted during stress. This is a low risk study. Nuclear stress EF:  47%. Defect 1: There is a medium defect of mild severity present in the basal inferior and mid inferior location. This is likely due to soft tissue attenuation. No large ischemic territories.  Recent Labs: 05/14/2020: NT-Pro BNP 25; TSH 1.670 10/31/2020: ALT 15 12/13/2020: Magnesium 2.2 01/20/2021: BUN 15; Creatinine, Ser 1.33; Hemoglobin 14.1; Platelets 312; Potassium 3.5; Sodium 138  BMET    Component Value Date/Time   NA 138 01/20/2021 0130   NA 142 10/31/2020 0812   K 3.5 01/20/2021 0130   CL 105 01/20/2021 0130   CO2 25 01/20/2021 0130   GLUCOSE 130 (H) 01/20/2021 0130   BUN 15 01/20/2021 0130   BUN 11 10/31/2020 0812   CREATININE 1.33 (H) 01/20/2021 0130   CREATININE 1.13 06/28/2019 0830   CALCIUM 8.2 (L) 01/20/2021 0130   GFRNONAA 54 (L) 01/20/2021 0130   GFRNONAA 62 06/28/2019 0830   GFRAA 53 (L) 07/24/2020 0913   GFRAA 72 06/28/2019 0830    Lipid Panel    Component Value Date/Time   CHOL 122 10/31/2020 0812   TRIG 131 10/31/2020 0812   HDL 45 10/31/2020 0812   CHOLHDL 2.9 08/01/2020 0852   CHOLHDL 3.3 01/29/2019 0800   VLDL 30 01/29/2019 0800   LDLCALC 54 10/31/2020 0812     ASSESSMENT:    1. PVCs (premature ventricular contractions)   2. Chronic combined systolic and diastolic heart failure ([place])   3. Primary hypertension   4. Heterozygous familial hypercholesterolemia   5. Coronary artery disease involving native coronary artery of native heart without angina pectoris   6. Diabetes mellitus type 2 in obese ([place])   7. Spinal stenosis of lumbar region with neurogenic claudication   8. Acute renal failure superimposed on stage 3a chronic kidney disease, unspecified acute renal failure type ([place])       PLAN:  In order of problems listed above:  PVCs/NSVT: He has very frequent and very premature ventricular ectopy that is likely causing a lot of ineffective heartbeats and "effective bradycardia".  There was no significant true sinus bradycardia or sinus  pauses on his 14-day event monitor.  We had previously avoided beta-blockers due to perceived chronotropic incompetence, but I think that we should try to see if we can suppress his PVCs with metoprolol..  Start metoprolol succinate 25 mg once daily. CHF: He appears clinically euvolemic and he has not had any heart failure exacerbation since we stopped his furosemide.  We will cut back his dose of spironolactone-hydrochlorothiazide when we add metoprolol.  His most recent echocardiogram showed LVEF 55%, on his nuclear stress test that was 47%.  He does have some evidence of diastolic dysfunction on the echo. HTN: Well-controlled.  We will cut his spironolactone-hydrochlorothiazide in half and add metoprolol succinate instead. 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  Excellent response to Repatha.  No side effects with Repatha.  He is due for refill.   CAD: He does not have any angina pectoris.  He had widespread atherosclerosis, but no severe obstructive lesions at the time of his last angiogram DM/obesity: Good glycemic control with most recent hemoglobin A1c 6.4%. Bilateral leg pain: Appears to have neurogenic claudication, on gabapentin.  He had normal ABI , February 2020. Abnormal creatinine: most recent creatinine 1.33, Probably slightly above baseline.  Medication Adjustments/Labs and Tests Ordered: Current medicines are reviewed at length with the patient today.  Concerns regarding medicines are outlined above.  Medication changes, Labs and Tests ordered today are listed in the Patient Instructions below. Patient Instructions  Medication Instructions:  START Metoprolol Succinate 25 mg once daily  TAKE the Spironolactone-HCTZ every other day   *If you need a refill on your cardiac medications before your next appointment, please call your pharmacy*   Lab Work: None ordered If you have labs (blood work) drawn today and your tests are completely normal, you will  receive your results only by: [place] (if you have MyChart) OR A paper copy in the mail If you have any lab test that is abnormal or we need to change your treatment, we will call you to review the results.   Testing/Procedures: None ordered   Follow-Up: At [facility], you and your health needs are our priority.  As part of our continuing mission to provide you with exceptional heart care, we have created designated Provider Care Teams.  These Care Teams include your primary Cardiologist (physician) and Advanced Practice Providers (APPs -  Physician Assistants and Nurse Practitioners) who all work together to provide you with the care you need, when you need it.  We recommend signing up for the patient portal called "MyChart".  Sign up information is provided on this After Visit Summary.  MyChart is used to connect with patients for Virtual Visits (Telemedicine).  Patients are able to view lab/test results, encounter notes, upcoming appointments, etc.  Non-urgent messages can be sent to your provider as well.   To learn more about what you can do with MyChart, go to [email or web address].    Your next appointment:   3 month(s)  The format for your next appointment:   In Person  Provider:   You may see Sanda Klein, MD or one of the following Advanced Practice Providers on your designated Care Team:   Almyra Deforest, PA-C Fabian Sharp, [place] or  Roby Lofts, [place]   Other Instructions Dr. Sallyanne Kuster would like you to check your blood pressure and heart rate daily for the next 2 weeks.  Keep a journal of these daily blood pressure and heart rate readings  and call our office or send a message through [place] with the results. Thank you!  It is best to check your BP 1-2 hours after taking your medications to see the medications effectiveness on your BP.    Here are some tips that our clinical pharmacists share for home BP monitoring:          Rest 10 minutes before taking your  blood pressure.          Don't smoke or drink caffeinated beverages for at least 30 minutes before.          Take your blood pressure before (not after) you eat.          Sit comfortably with your back supported and both feet on the floor (don't cross your legs).          Elevate your arm to heart level on a table or a desk.          Use the proper sized cuff. It should fit smoothly and snugly around your bare upper arm. There should be enough room to slip a fingertip under the cuff. The bottom edge of the cuff should be 1 inch above the crease of the elbow.      Signed, Sanda Klein, MD  02/19/2021 3:01 PM    [facility] [address], [place], [place]  [address] Phone: [phone]; Fax: [phone]

## 2021-02-19 NOTE — Patient Instructions (Signed)
Medication Instructions:  START Metoprolol Succinate 25 mg once daily  TAKE the Spironolactone-HCTZ every other day   *If you need a refill on your cardiac medications before your next appointment, please call your pharmacy*   Lab Work: None ordered If you have labs (blood work) drawn today and your tests are completely normal, you will receive your results only by: [place] (if you have MyChart) OR A paper copy in the mail If you have any lab test that is abnormal or we need to change your treatment, we will call you to review the results.   Testing/Procedures: None ordered   Follow-Up: At [facility], you and your health needs are our priority.  As part of our continuing mission to provide you with exceptional heart care, we have created designated Provider Care Teams.  These Care Teams include your primary Cardiologist (physician) and Advanced Practice Providers (APPs -  Physician Assistants and Nurse Practitioners) who all work together to provide you with the care you need, when you need it.  We recommend signing up for the patient portal called "MyChart".  Sign up information is provided on this After Visit Summary.  MyChart is used to connect with patients for Virtual Visits (Telemedicine).  Patients are able to view lab/test results, encounter notes, upcoming appointments, etc.  Non-urgent messages can be sent to your provider as well.   To learn more about what you can do with MyChart, go to [email or web address].    Your next appointment:   3 month(s)  The format for your next appointment:   In Person  Provider:   You may see Sanda Klein, MD or one of the following Advanced Practice Providers on your designated Care Team:   Almyra Deforest, PA-C Fabian Sharp, [place] or  Roby Lofts, [place]   Other Instructions Dr. Sallyanne Kuster would like you to check your blood pressure and heart rate daily for the next 2 weeks.  Keep a journal of these daily blood pressure and  heart rate readings and call our office or send a message through [place] with the results. Thank you!  It is best to check your BP 1-2 hours after taking your medications to see the medications effectiveness on your BP.    Here are some tips that our clinical pharmacists share for home BP monitoring:          Rest 10 minutes before taking your blood pressure.          Don't smoke or drink caffeinated beverages for at least 30 minutes before.          Take your blood pressure before (not after) you eat.          Sit comfortably with your back supported and both feet on the floor (don't cross your legs).          Elevate your arm to heart level on a table or a desk.          Use the proper sized cuff. It should fit smoothly and snugly around your bare upper arm. There should be enough room to slip a fingertip under the cuff. The bottom edge of the cuff should be 1 inch above the crease of the elbow.

## 2021-02-20 ENCOUNTER — Telehealth: Payer: Self-pay | Admitting: Cardiovascular Disease

## 2021-02-20 NOTE — Telephone Encounter (Signed)
*  STAT* If patient is at the pharmacy, call can be transferred to refill team.   1. Which medications need to be refilled? (please list name of each medication and dose if known)  Evolocumab (REPATHA SURECLICK) [id number] MG/ML SOAJ  2. Which pharmacy/location (including street and city if local pharmacy) is medication to be sent to?  [facility] ([facility] - [place], [place] 3. Do they need a 30 day or 90 day supply? 90 day supply   Script was sent to [facility], needed to be sent to [facility]

## 2021-02-21 ENCOUNTER — Other Ambulatory Visit: Payer: Self-pay

## 2021-02-21 MED ORDER — REPATHA SURECLICK 140 MG/ML ~~LOC~~ SOAJ: SUBCUTANEOUS | 11 refills | Status: DC

## 2021-02-25 ENCOUNTER — Other Ambulatory Visit: Payer: Self-pay

## 2021-02-25 ENCOUNTER — Encounter: Payer: Self-pay | Admitting: Nurse Practitioner

## 2021-02-25 ENCOUNTER — Ambulatory Visit (INDEPENDENT_AMBULATORY_CARE_PROVIDER_SITE_OTHER): Payer: Medicare HMO | Admitting: Nurse Practitioner

## 2021-02-25 VITALS — BP 138/74 | HR 52 | Temp 97.2°F | Ht 66.0 in | Wt 212.0 lb

## 2021-02-25 DIAGNOSIS — E785 Hyperlipidemia, unspecified: Secondary | ICD-10-CM | POA: Diagnosis not present

## 2021-02-25 DIAGNOSIS — R944 Abnormal results of kidney function studies: Secondary | ICD-10-CM | POA: Diagnosis not present

## 2021-02-25 DIAGNOSIS — Z1329 Encounter for screening for other suspected endocrine disorder: Secondary | ICD-10-CM | POA: Diagnosis not present

## 2021-02-25 DIAGNOSIS — K219 Gastro-esophageal reflux disease without esophagitis: Secondary | ICD-10-CM

## 2021-02-25 DIAGNOSIS — I2510 Atherosclerotic heart disease of native coronary artery without angina pectoris: Secondary | ICD-10-CM | POA: Diagnosis not present

## 2021-02-25 DIAGNOSIS — I10 Essential (primary) hypertension: Secondary | ICD-10-CM | POA: Diagnosis not present

## 2021-02-25 DIAGNOSIS — E1169 Type 2 diabetes mellitus with other specified complication: Secondary | ICD-10-CM | POA: Diagnosis not present

## 2021-02-25 DIAGNOSIS — E7800 Pure hypercholesterolemia, unspecified: Secondary | ICD-10-CM

## 2021-02-25 DIAGNOSIS — E7801 Familial hypercholesterolemia: Secondary | ICD-10-CM | POA: Diagnosis not present

## 2021-02-25 DIAGNOSIS — E559 Vitamin D deficiency, unspecified: Secondary | ICD-10-CM | POA: Diagnosis not present

## 2021-02-25 DIAGNOSIS — R5382 Chronic fatigue, unspecified: Secondary | ICD-10-CM | POA: Diagnosis not present

## 2021-02-25 NOTE — Progress Notes (Signed)
Acute Office Visit  Subjective:    [name], male    DOB: 26-Feb-1941, 80 y.o.   MRN: [id number]  Chief Complaint  Patient presents with   Hypertension    Follow up    Hypertension  Patient is in today for lab follow-up.  He is followed by cardiology for bigeminy. He is asymptomatic today.  He has hx of HTN, HLD, and GERD. No acute concerns or adverse med effects.   Past Medical History:  Diagnosis Date   Aortic valve sclerosis    Atherosclerosis of coronary artery without angina pectoris 07/05/2014   Cervical spondylosis without myelopathy 02/13/2013   Chest pain 01/29/2019   Degeneration of cervical intervertebral disc 02/13/2013   Depression    sometimes   Diabetes mellitus type 2, diet-controlled (HCC)    Elevated troponin    GERD (gastroesophageal reflux disease)    Heart murmur    HTN (hypertension) 07/06/2013   Hypercholesterolemia 08/21/2015   Hyperlipidemia 07/06/2013   Medication management 08/21/2015   Pre-diabetes    Rectal bleeding 09/21/2019   Stiffness of left shoulder joint 07/19/2013    Past Surgical History:  Procedure Laterality Date   BIOPSY  12/14/2019   Procedure: BIOPSY;  Surgeon: Daneil Dolin, MD;  [facility]: [facility];  Service: Endoscopy;;   CERVICAL SPINE SURGERY     COLONOSCOPY  07/26/2012   Procedure: COLONOSCOPY;  Surgeon: Jamesetta So, MD;  Location: [facility];  Service: Gastroenterology;  Laterality: N/A;   COLONOSCOPY WITH PROPOFOL N/A 12/14/2019   Procedure: COLONOSCOPY WITH PROPOFOL;  Surgeon: Daneil Dolin, MD;  Location: [facility];  Service: Endoscopy;  Laterality: N/A;  9:00am   KNEE ARTHROSCOPY Left    torn ligament   LUMBAR LAMINECTOMY/DECOMPRESSION MICRODISCECTOMY Bilateral 12/27/2019   Procedure: BIATERAL LUMBAR TWO- LUMBAR THREE, LUMBAR THREE- LUMBAR FOUR, LUMBAR FOUR-FIVE LAMINOTOMY/FORAMINOTOMY 3 LEVELS;  Surgeon: Newman Pies, MD;  Location: [facility];  Service: Neurosurgery;  Laterality:  Bilateral;  POSTERIOR   SPINE SURGERY N/A    [employer] 02/24/2020    Family History  Problem Relation Age of Onset   Asthma Mother    Hypertension Father    Colon cancer Neg Hx     Social History   Socioeconomic History   Marital status: Widowed    Spouse name: Cherly    Number of children: 1   Years of education: Not on file   Highest education level: 12th grade  Occupational History    Comment: Retired   Tobacco Use   Smoking status: Never   Smokeless tobacco: Never  Vaping Use   Vaping Use: Never used  Substance and Sexual Activity   Alcohol use: No   Drug use: No   Sexual activity: Not Currently  Other Topics Concern   Not on file  Social History Narrative   Live alone right now, wife passed in 2009    [place] daughter-grandchild 2 live in [place]      Enjoys playing golf      Diet: eats what he likes, sweet, fast food, does not eat a lot of canned or boxed foods.    Does not eat enough fruits and veggies.       Caffeine: 1 cup daily   Water: 3-4 bottles of water daily, Gatorade every now and then         Wears seat belt   Wears sun protection when golfing   Smoke detectors at home   Does not use  phone while driving   Social Determinants of Health   Financial Resource Strain: Low Risk    Difficulty of Paying Living Expenses: Not hard at all  Food Insecurity: No Food Insecurity   Worried About Charity fundraiser in the Last Year: Never true   [place] in the Last Year: Never true  Transportation Needs: No Transportation Needs   Lack of Transportation (Medical): No   Lack of Transportation (Non-Medical): No  Physical Activity: Insufficiently Active   Days of Exercise per Week: 2 days   Minutes of Exercise per Session: 10 min  Stress: No Stress Concern Present   Feeling of Stress : Not at all  Social Connections: Moderately Isolated   Frequency of Communication with Friends and Family: More than three times a week   Frequency of Social  Gatherings with Friends and Family: More than three times a week   Attends Religious Services: More than 4 times per year   Active Member of [employer] or Organizations: No   Attends Archivist Meetings: Never   Marital Status: Widowed  Intimate Partner Violence: Not on file    Outpatient Medications Prior to Visit  Medication Sig Dispense Refill   acetaminophen (TYLENOL) 650 MG CR tablet Take 1,300 mg by mouth every 8 (eight) hours as needed for pain.      amLODipine (NORVASC) 10 MG tablet Take 1 tablet (10 mg total) by mouth daily. 90 tablet 1   aspirin EC 81 MG tablet Take 81 mg by mouth daily.     benazepril (LOTENSIN) 40 MG tablet Take 1 tablet (40 mg total) by mouth daily. 90 tablet 1   buPROPion (WELLBUTRIN XL) 150 MG 24 hr tablet Take 1 tablet (150 mg total) by mouth daily. 90 tablet 2   Evolocumab (REPATHA SURECLICK) 856 MG/ML SOAJ INJECT 140 MG INTO THE SKIN EVERY 14 DAY 2 mL 11   gabapentin (NEURONTIN) 300 MG capsule TAKE 1 CAPSULE THREE TIMES DAILY 90 capsule 0   magnesium oxide (MAG-OX) 400 MG tablet Take 400 mg by mouth daily.     meclizine (ANTIVERT) 25 MG tablet Take 1 tablet (25 mg total) by mouth 3 (three) times daily as needed for dizziness. 30 tablet 0   meloxicam (MOBIC) 7.5 MG tablet Take 1 tablet (7.5 mg total) by mouth daily. 90 tablet 5   metoprolol succinate (TOPROL-XL) 25 MG 24 hr tablet Take 1 tablet (25 mg total) by mouth daily. Take with or immediately following a meal. 90 tablet 1   multivitamin-iron-minerals-folic acid (CENTRUM) chewable tablet Chew 1 tablet by mouth daily.     pantoprazole (PROTONIX) 40 MG tablet Take 1 tablet (40 mg total) by mouth daily. 90 tablet 2   spironolactone-hydrochlorothiazide (ALDACTAZIDE) 25-25 MG tablet Take 1 tablet by mouth every other day. 90 tablet 1   traZODone (DESYREL) 50 MG tablet Take 0.5-1 tablets (25-50 mg total) by mouth at bedtime as needed for sleep. 30 tablet 3   No facility-administered medications prior to  visit.    Allergies  Allergen Reactions   Metformin And Related Diarrhea and Nausea And Vomiting   Statins Other (See Comments)    Muscle/joint pain     Review of Systems  Constitutional: Negative.   Respiratory: Negative.    Cardiovascular: Negative.   Musculoskeletal: Negative.   Psychiatric/Behavioral: Negative.        Objective:    Physical Exam Constitutional:      Appearance: Normal appearance.  Cardiovascular:  Rate and Rhythm: Bradycardia present. Rhythm irregular.     Pulses: Normal pulses.     Heart sounds: Normal heart sounds.  Pulmonary:     Effort: Pulmonary effort is normal.     Breath sounds: Normal breath sounds.  Neurological:     Mental Status: He is alert.  Psychiatric:        Mood and Affect: Mood normal.        Behavior: Behavior normal.        Thought Content: Thought content normal.        Judgment: Judgment normal.    BP 138/74 (BP Location: Left Arm, Patient Position: Sitting, Cuff Size: Large)   Pulse (!) 52   Temp (!) 97.2 F (36.2 C) (Temporal)   Ht $R'5\' 6"'hL$  (1.676 m)   Wt 212 lb (96.2 kg)   SpO2 95%   BMI 34.22 kg/m  Wt Readings from Last 3 Encounters:  02/25/21 212 lb (96.2 kg)  02/19/21 209 lb (94.8 kg)  01/28/21 209 lb (94.8 kg)    Health Maintenance Due  Topic Date Due   FOOT EXAM  05/10/2020   PNA vac Low Risk Adult (2 of 2 - PCV13) 11/28/2020   INFLUENZA VACCINE  02/17/2021    There are no preventive care reminders to display for this patient.   Lab Results  Component Value Date   TSH 1.670 05/14/2020   Lab Results  Component Value Date   WBC 9.4 01/20/2021   HGB 14.1 01/20/2021   HCT 42.5 01/20/2021   MCV 95.1 01/20/2021   PLT 312 01/20/2021   Lab Results  Component Value Date   NA 138 01/20/2021   K 3.5 01/20/2021   CO2 25 01/20/2021   GLUCOSE 130 (H) 01/20/2021   BUN 15 01/20/2021   CREATININE 1.33 (H) 01/20/2021   BILITOT 0.6 10/31/2020   ALKPHOS 88 10/31/2020   AST 22 10/31/2020   ALT 15  10/31/2020   PROT 6.9 10/31/2020   ALBUMIN 4.2 10/31/2020   CALCIUM 8.2 (L) 01/20/2021   ANIONGAP 8 01/20/2021   EGFR 58 (L) 10/31/2020   Lab Results  Component Value Date   CHOL 122 10/31/2020   Lab Results  Component Value Date   HDL 45 10/31/2020   Lab Results  Component Value Date   LDLCALC 54 10/31/2020   Lab Results  Component Value Date   TRIG 131 10/31/2020   Lab Results  Component Value Date   CHOLHDL 2.9 08/01/2020   Lab Results  Component Value Date   HGBA1C 6.4 (H) 10/31/2020       Assessment & Plan:   Problem List Items Addressed This Visit       Cardiovascular and Mediastinum   HTN (hypertension)    BP Readings from Last 3 Encounters:  02/25/21 138/74  02/19/21 126/62  01/28/21 (!) 162/90  -well controlled -no change to antihypertensives      Relevant Orders   CBC with Differential/Platelet   CMP14+EGFR   Lipid Panel With LDL/HDL Ratio     Digestive   Gastroesophageal reflux disease    -doing well with protonix; no med changes         Endocrine   Type 2 diabetes mellitus with hyperlipidemia ([place]) - Primary    -ordered A1c today       Relevant Orders   CBC with Differential/Platelet   CMP14+EGFR   Lipid Panel With LDL/HDL Ratio   Hemoglobin A1c     Other   Hyperlipidemia    -  goal LDL < 70 -taking repatha; has statin intolerance       Relevant Orders   Lipid Panel With LDL/HDL Ratio   Decreased GFR    -checking renal fcn with labs       Relevant Orders   CMP14+EGFR     No orders of the defined types were placed in this encounter.    Noreene Larsson, NP

## 2021-02-25 NOTE — Assessment & Plan Note (Signed)
-  doing well with protonix; no med changes

## 2021-02-25 NOTE — Assessment & Plan Note (Signed)
-  ordered A1c today

## 2021-02-25 NOTE — Assessment & Plan Note (Signed)
-  goal LDL < 70 -taking repatha; has statin intolerance

## 2021-02-25 NOTE — Assessment & Plan Note (Signed)
-  checking renal fcn with labs

## 2021-02-25 NOTE — Patient Instructions (Addendum)
He would like to change his AWV to a tele AWV.  Thank you for having labs drawn this morning.  We will meet up in a few months.  Please have fasting labs drawn 2-3 days prior to your appointment so we can discuss the results during your office visit.

## 2021-02-25 NOTE — Assessment & Plan Note (Signed)
BP Readings from Last 3 Encounters:  02/25/21 138/74  02/19/21 126/62  01/28/21 (!) 162/90   -well controlled -no change to antihypertensives

## 2021-02-26 ENCOUNTER — Other Ambulatory Visit: Payer: Self-pay | Admitting: Nurse Practitioner

## 2021-02-26 LAB — CMP14+EGFR
ALT: 20 IU/L (ref 0–44)
AST: 23 IU/L (ref 0–40)
Albumin/Globulin Ratio: 1.6 (ref 1.2–2.2)
Albumin: 4 g/dL (ref 3.7–4.7)
Alkaline Phosphatase: 73 IU/L (ref 44–121)
BUN/Creatinine Ratio: 10 (ref 10–24)
BUN: 11 mg/dL (ref 8–27)
Bilirubin Total: 0.6 mg/dL (ref 0.0–1.2)
CO2: 21 mmol/L (ref 20–29)
Calcium: 8.6 mg/dL (ref 8.6–10.2)
Chloride: 108 mmol/L — ABNORMAL HIGH (ref 96–106)
Creatinine, Ser: 1.06 mg/dL (ref 0.76–1.27)
Globulin, Total: 2.5 g/dL (ref 1.5–4.5)
Glucose: 114 mg/dL — ABNORMAL HIGH (ref 65–99)
Potassium: 3.9 mmol/L (ref 3.5–5.2)
Sodium: 144 mmol/L (ref 134–144)
Total Protein: 6.5 g/dL (ref 6.0–8.5)
eGFR: 71 mL/min/{1.73_m2} (ref >59)

## 2021-02-26 LAB — CBC WITH DIFFERENTIAL/PLATELET
Basophils Absolute: 0.1 10*3/uL (ref 0.0–0.2)
Basos: 1 %
EOS (ABSOLUTE): 0.2 10*3/uL (ref 0.0–0.4)
Eos: 3 %
Hematocrit: 39.2 % (ref 37.5–51.0)
Hemoglobin: 13.1 g/dL (ref 13.0–17.7)
Immature Grans (Abs): 0 10*3/uL (ref 0.0–0.1)
Immature Granulocytes: 0 %
Lymphocytes Absolute: 2.3 10*3/uL (ref 0.7–3.1)
Lymphs: 29 %
MCH: 30.8 pg (ref 26.6–33.0)
MCHC: 33.4 g/dL (ref 31.5–35.7)
MCV: 92 fL (ref 79–97)
Monocytes Absolute: 0.9 10*3/uL (ref 0.1–0.9)
Monocytes: 12 %
Neutrophils Absolute: 4.3 10*3/uL (ref 1.4–7.0)
Neutrophils: 55 %
Platelets: 282 10*3/uL (ref 150–450)
RBC: 4.26 x10E6/uL (ref 4.14–5.80)
RDW: 12.9 % (ref 11.6–15.4)
WBC: 7.8 10*3/uL (ref 3.4–10.8)

## 2021-02-26 LAB — TSH: TSH: 1.66 u[IU]/mL (ref 0.450–4.500)

## 2021-02-26 LAB — LIPID PANEL WITH LDL/HDL RATIO
Cholesterol, Total: 126 mg/dL (ref 100–199)
HDL: 44 mg/dL (ref >39)
LDL Chol Calc (NIH): 54 mg/dL (ref 0–99)
LDL/HDL Ratio: 1.2 ratio (ref 0.0–3.6)
Triglycerides: 165 mg/dL — ABNORMAL HIGH (ref 0–149)
VLDL Cholesterol Cal: 28 mg/dL (ref 5–40)

## 2021-02-26 LAB — VITAMIN D 25 HYDROXY (VIT D DEFICIENCY, FRACTURES): Vit D, 25-Hydroxy: 40.6 ng/mL (ref 30.0–100.0)

## 2021-02-26 LAB — HEMOGLOBIN A1C
Est. average glucose Bld gHb Est-mCnc: 143 mg/dL
Hgb A1c MFr Bld: 6.6 % — ABNORMAL HIGH (ref 4.8–5.6)

## 2021-02-26 MED ORDER — GLIPIZIDE ER 2.5 MG PO TB24: 2.5000 mg | ORAL_TABLET | Freq: Every day | ORAL | 1 refills | Status: DC

## 2021-02-26 MED ORDER — BLOOD GLUCOSE METER KIT: PACK | 0 refills | Status: AC

## 2021-02-26 NOTE — Progress Notes (Signed)
A1c is 6.6, which is elevated. I added a low dose of glipizide to keep his blood sugar down, and I sent in a blood sugar meter so he can check his blood sugar occasionally. If he eats carbs, he can check to see if there are any foods he should avoid. His blood sugar isn't high enough that he would have to check it every day.

## 2021-02-27 ENCOUNTER — Telehealth: Payer: Self-pay | Admitting: Cardiovascular Disease

## 2021-02-27 NOTE — Telephone Encounter (Signed)
Pt c/o medication issue:  1. Name of Medication: metoprolol succinate (TOPROL-XL) 25 MG 24 hr tablet  2. How are you currently taking this medication (dosage and times per day)? Patient started taking the medication Tues 02/25/21.   3. Are you having a reaction (difficulty breathing--STAT)?   4. What is your medication issue? Patient states his R shoulder is sore and the pain goes down his arm. He is not sore anywhere else. He states that the pain did not start until after he started taking this medication. He wanted to know if this was a regular side effect. Please advise

## 2021-02-27 NOTE — Telephone Encounter (Signed)
The patient has been made aware. He stated that the pain is now gone. He will keep the office updated.  Croitoru, Mihai, MD  You 20 minutes ago (1:15 PM)   That is not the type of side effect I have ever seen associated with this medication.  Please continue taking the metoprolol.

## 2021-02-27 NOTE — Telephone Encounter (Signed)
The patient called in today about right shoulder pain that radiates to his hand when he uses it. He wants to know if this could be from the Metoprolol. He started it on 02/25/21.   He denies any other symptoms other than the shoulder pain. He stated that it started this morning. He does not feel like he pulled anything yesterday or slept on it wrong last night.

## 2021-03-03 ENCOUNTER — Other Ambulatory Visit: Payer: Self-pay

## 2021-03-03 ENCOUNTER — Ambulatory Visit (INDEPENDENT_AMBULATORY_CARE_PROVIDER_SITE_OTHER): Payer: Medicare HMO | Admitting: *Deleted

## 2021-03-03 DIAGNOSIS — Z0000 Encounter for general adult medical examination without abnormal findings: Secondary | ICD-10-CM

## 2021-03-03 NOTE — Progress Notes (Signed)
Subjective:   Douglas Keller is a 80 y.o. male who presents for Medicare Annual/Subsequent preventive examination.  I connected with  Cammy Copa on 03/03/21 by an audio enabled telemedicine application and verified that I am speaking with the correct person using two identifiers.   I discussed the limitations, risks, security and privacy concerns of performing an evaluation and management service by telephone and the availability of in person appointments. I also discussed with the patient that there may be a patient responsible charge related to this service. The patient expressed understanding and verbally consented to this telephonic visit.  Review of Systems           Objective:    There were no vitals filed for this visit. There is no height or weight on file to calculate BMI.  Advanced Directives 01/20/2021 12/13/2020 02/28/2020 12/27/2019 12/12/2019 08/16/2019 08/15/2019  Does Patient Have a Medical Advance Directive? _0  Yes Yes  Type of Advance Directive - - - - - Catering manager  Does patient want to make changes to medical advance directive? - - - - - - -  Would patient like information on creating a medical advance directive? No - Patient declined - Yes (ED - Information included in AVS) - No - Patient declined - -    Current Medications (verified) Outpatient Encounter Medications as of 03/03/2021  Medication Sig   acetaminophen (TYLENOL) 650 MG CR tablet Take 1,300 mg by mouth every 8 (eight) hours as needed for pain.    amLODipine (NORVASC) 10 MG tablet Take 1 tablet (10 mg total) by mouth daily.   aspirin EC 81 MG tablet Take 81 mg by mouth daily.   benazepril (LOTENSIN) 40 MG tablet Take 1 tablet (40 mg total) by mouth daily.   blood glucose meter kit and supplies Dispense based on patient and insurance preference. Use up to four times daily as directed. (FOR ICD-10 E10.9, E11.9).   buPROPion (WELLBUTRIN XL) 150 MG 24  hr tablet Take 1 tablet (150 mg total) by mouth daily.   Evolocumab (REPATHA SURECLICK) 277 MG/ML SOAJ INJECT 140 MG INTO THE SKIN EVERY 14 DAY   gabapentin (NEURONTIN) 300 MG capsule TAKE 1 CAPSULE THREE TIMES DAILY   glipiZIDE (GLIPIZIDE XL) 2.5 MG 24 hr tablet Take 1 tablet (2.5 mg total) by mouth daily with breakfast.   magnesium oxide (MAG-OX) 400 MG tablet Take 400 mg by mouth daily.   meclizine (ANTIVERT) 25 MG tablet Take 1 tablet (25 mg total) by mouth 3 (three) times daily as needed for dizziness.   meloxicam (MOBIC) 7.5 MG tablet Take 1 tablet (7.5 mg total) by mouth daily.   metoprolol succinate (TOPROL-XL) 25 MG 24 hr tablet Take 1 tablet (25 mg total) by mouth daily. Take with or immediately following a meal.   multivitamin-iron-minerals-folic acid (CENTRUM) chewable tablet Chew 1 tablet by mouth daily.   pantoprazole (PROTONIX) 40 MG tablet Take 1 tablet (40 mg total) by mouth daily.   spironolactone-hydrochlorothiazide (ALDACTAZIDE) 25-25 MG tablet Take 1 tablet by mouth every other day.   traZODone (DESYREL) 50 MG tablet Take 0.5-1 tablets (25-50 mg total) by mouth at bedtime as needed for sleep.   No facility-administered encounter medications on file as of 03/03/2021.    Allergies (verified) Metformin and related and Statins   History: Past Medical History:  Diagnosis Date   Aortic valve sclerosis    Atherosclerosis of coronary artery without angina pectoris 07/05/2014  Cervical spondylosis without myelopathy 02/13/2013   Chest pain 01/29/2019   Degeneration of cervical intervertebral disc 02/13/2013   Depression    sometimes   Diabetes mellitus type 2, diet-controlled (HCC)    Elevated troponin    GERD (gastroesophageal reflux disease)    Heart murmur    HTN (hypertension) 07/06/2013   Hypercholesterolemia 08/21/2015   Hyperlipidemia 07/06/2013   Medication management 08/21/2015   Pre-diabetes    Rectal bleeding 09/21/2019   Stiffness of left shoulder joint  07/19/2013   Past Surgical History:  Procedure Laterality Date   BIOPSY  12/14/2019   Procedure: BIOPSY;  Surgeon: Daneil Dolin, MD;  [facility];  Service: Endoscopy;;   CERVICAL SPINE SURGERY     COLONOSCOPY  07/26/2012   Procedure: COLONOSCOPY;  Surgeon: Jamesetta So, MD;  Location: [facility];  Service: Gastroenterology;  Laterality: N/A;   COLONOSCOPY WITH PROPOFOL N/A 12/14/2019   Procedure: COLONOSCOPY WITH PROPOFOL;  Surgeon: Daneil Dolin, MD;  Location: [facility];  Service: Endoscopy;  Laterality: N/A;  9:00am   KNEE ARTHROSCOPY Left    torn ligament   LUMBAR LAMINECTOMY/DECOMPRESSION MICRODISCECTOMY Bilateral 12/27/2019   Procedure: BIATERAL LUMBAR TWO- LUMBAR THREE, LUMBAR THREE- LUMBAR FOUR, LUMBAR FOUR-FIVE LAMINOTOMY/FORAMINOTOMY 3 LEVELS;  Surgeon: Newman Pies, MD;  Location: [facility];  Service: Neurosurgery;  Laterality: Bilateral;  POSTERIOR   SPINE SURGERY N/A    Phreesia 02/24/2020   Family History  Problem Relation Age of Onset   Asthma Mother    Hypertension Father    Colon cancer Neg Hx    Social History   Socioeconomic History   Marital status: Widowed    Spouse name: Cherly    Number of children: 1   Years of education: Not on file   Highest education level: 12th grade  Occupational History    Comment: Retired   Tobacco Use   Smoking status: Never   Smokeless tobacco: Never  Vaping Use   Vaping Use: Never used  Substance and Sexual Activity   Alcohol use: No   Drug use: No   Sexual activity: Not Currently  Other Topics Concern   Not on file  Social History Narrative   Live alone right now, wife passed in 2009    [place] daughter-grandchild 2 live in [place]      Enjoys playing golf      Diet: eats what he likes, sweet, fast food, does not eat a lot of canned or boxed foods.    Does not eat enough fruits and veggies.       Caffeine: 1 cup daily   Water: 3-4 bottles of water daily, Gatorade every now and then          Wears seat belt   Wears sun protection when golfing   Smoke detectors at home   Does not use phone while driving   Social Determinants of Health   Financial Resource Strain: Not on file  Food Insecurity: Not on file  Transportation Needs: Not on file  Physical Activity: Not on file  Stress: Not on file  Social Connections: Not on file    Tobacco Counseling Counseling given: Not Answered   Clinical Intake:                 Diabetic?Nutrition Risk Assessment:  Has the patient had any N/V/D within the last 2 months?  No  Does the patient have any non-healing wounds?  No  Has the patient had any  unintentional weight loss or weight gain?  No   Diabetes:  Is the patient diabetic?  Yes  If diabetic, was a CBG obtained today?  No  Did the patient bring in their glucometer from home?  No  How often do you monitor your CBG's? Daily .   Financial Strains and Diabetes Management:  Are you having any financial strains with the device, your supplies or your medication? No .  Does the patient want to be seen by Chronic Care Management for management of their diabetes?  No  Would the patient like to be referred to a Nutritionist or for Diabetic Management?  No   Diabetic Exams:  Diabetic Eye Exam: Completed 04-19-20. Overdue for diabetic eye exam. Pt has been advised about the importance in completing this exam. A referral has been placed today. Message sent to referral coordinator for scheduling purposes. Advised pt to expect a call from office referred to regarding appt.  Diabetic Foot Exam: Completed 05-11-19. Pt has been advised about the importance in completing this exam. Pt is scheduled for diabetic foot exam on next visit .           Activities of Daily Living No flowsheet data found.  Patient Care Team: Noreene Larsson, NP as PCP - General (Nurse Practitioner) Sanda Klein, MD as PCP - Cardiology (Cardiology) Gala Romney [name], MD as Consulting Physician  (Gastroenterology)  Indicate any recent Medical Services you may have received from other than [facility] providers in the past year (date may be approximate).     Assessment:   This is a routine wellness examination for Douglas Keller.  Hearing/Vision screen No results found.  Dietary issues and exercise activities discussed:     Goals Addressed   None   Depression Screen PHQ 2/9 Scores 02/25/2021 01/28/2021 12/10/2020 10/30/2020 07/31/2020 07/24/2020 04/18/2020  PHQ - 2 Score 0 0 0 0 0 0 0  PHQ- 9 Score - - - - 3 1 -    Fall Risk Fall Risk  02/25/2021 01/28/2021 12/10/2020 10/30/2020 07/31/2020  Falls in the past year? 0 0 0 0 0  Number falls in past yr: 0 0 0 0 0  Injury with Fall? 0 0 0 0 0  Risk for fall due to : _0   Follow up _1     FALL RISK PREVENTION PERTAINING TO THE HOME:  Any stairs in or around the home? Yes  If so, are there any without handrails? Yes  Home free of loose throw rugs in walkways, pet beds, electrical cords, etc? Yes  Adequate lighting in your home to reduce risk of falls? Yes   ASSISTIVE DEVICES UTILIZED TO PREVENT FALLS:  Life alert? Yes  Use of a cane, walker or w/c? No  Grab bars in the bathroom? No  Shower chair or bench in shower? No  Elevated toilet seat or a handicapped toilet? No   TIMED UP AND GO:  Was the test performed? No .  Length of time to ambulate 10 feet: NA sec.     Cognitive Function:     6CIT Screen 02/28/2020 02/28/2020  What Year? 0 points 0 points  What month? 0 points 0 points  What time? 0 points -  Count back from 20 0 points -  Months in reverse 0 points -  Repeat phrase 0 points -  Total  Score 0 -    Immunizations Immunization History  Administered Date(s) Administered   Fluad Quad(high Dose 65+) 03/20/2019, 05/03/2020    Influenza-Unspecified 04/19/2014   Moderna Sars-Covid-2 Vaccination 08/11/2019, 09/11/2019, 05/15/2020   Pneumococcal Polysaccharide-23 11/29/2019   Zoster Recombinat (Shingrix) 02/03/2019    TDAP status: Due, Education has been provided regarding the importance of this vaccine. Advised may receive this vaccine at local pharmacy or Health Dept. Aware to provide a copy of the vaccination record if obtained from local pharmacy or Health Dept. Verbalized acceptance and understanding.  Flu Vaccine status: Due, Education has been provided regarding the importance of this vaccine. Advised may receive this vaccine at local pharmacy or Health Dept. Aware to provide a copy of the vaccination record if obtained from local pharmacy or Health Dept. Verbalized acceptance and understanding.  Pneumococcal vaccine status: Up to date  Covid-19 vaccine status: Completed vaccines  Qualifies for Shingles Vaccine? Yes   Zostavax completed Yes   Shingrix Completed?: Yes  Screening Tests Health Maintenance  Topic Date Due   FOOT EXAM  05/10/2020   PNA vac Low Risk Adult (2 of 2 - PCV13) 11/28/2020   INFLUENZA VACCINE  02/17/2021   COVID-19 Vaccine (4 - Booster for Moderna series) 03/13/2021 (Originally 08/15/2020)   Zoster Vaccines- Shingrix (2 of 2) 04/30/2021 (Originally 03/31/2019)   Hepatitis C Screening  07/24/2021 (Originally 01/08/1959)   TETANUS/TDAP  12/10/2021 (Originally 01/07/1960)   OPHTHALMOLOGY EXAM  04/19/2021   HEMOGLOBIN A1C  08/28/2021   HPV VACCINES  Aged Out    Health Maintenance  Health Maintenance Due  Topic Date Due   FOOT EXAM  05/10/2020   PNA vac Low Risk Adult (2 of 2 - PCV13) 11/28/2020   INFLUENZA VACCINE  02/17/2021    Colorectal cancer screening: Type of screening: Colonoscopy. Completed 12-14-19. Repeat every 10 years  Lung Cancer Screening: (Low Dose CT Chest recommended if Age 45-80 years, 30 pack-year currently smoking OR have quit w/in 15years.) does not qualify.    Lung Cancer Screening Referral: NA  Additional Screening:  Hepatitis C Screening: does qualify; needs to be ordered   Vision Screening: Recommended annual ophthalmology exams for early detection of glaucoma and other disorders of the eye. Is the patient up to date with their annual eye exam?  Yes  Who is the provider or what is the name of the office in which the patient attends annual eye exams? [facility]  If pt is not established with a provider, would they like to be referred to a provider to establish care? No .   Dental Screening: Recommended annual dental exams for proper oral hygiene  Community Resource Referral / Chronic Care Management: CRR required this visit?  No   CCM required this visit?  No      Plan:     I have personally reviewed and noted the following in the patient's chart:   Medical and social history Use of alcohol, tobacco or illicit drugs  Current medications and supplements including opioid prescriptions. Patient is not currently taking opioid prescriptions. Functional ability and status Nutritional status Physical activity Advanced directives List of other physicians Hospitalizations, surgeries, and ER visits in previous 12 months Vitals Screenings to include cognitive, depression, and falls Referrals and appointments  In addition, I have reviewed and discussed with patient certain preventive protocols, quality metrics, and best practice recommendations. A written personalized care plan for preventive services as well as general preventive health recommendations were provided to patient.  Shelda Altes, CMA   03/03/2021   Nurse Notes: The patient was at home. The provider was Demetrius Revel NP and was in office. This was a telehealth visit.

## 2021-03-03 NOTE — Patient Instructions (Signed)
Douglas Keller , Thank you for taking time to come for your Medicare Wellness Visit. I appreciate your ongoing commitment to your health goals. Please review the following plan we discussed and let me know if I can assist you in the future.   Screening recommendations/referrals: Colonoscopy: Completed Due 12-13-2029 Recommended yearly ophthalmology/optometry visit for glaucoma screening and checkup Recommended yearly dental visit for hygiene and checkup  Vaccinations: Influenza vaccine: Due now  Pneumococcal vaccine: Completed Tdap vaccine: Due now  Shingles vaccine: Completed per patient     Advanced directives: Patient declined information  Conditions/risks identified: Hypertension, Diabetes  Next appointment: 1 year   Preventive Care 79 Years and Older, Male Preventive care refers to lifestyle choices and visits with your health care provider that can promote health and wellness. What does preventive care include? A yearly physical exam. This is also called an annual well check. Dental exams once or twice a year. Routine eye exams. Ask your health care provider how often you should have your eyes checked. Personal lifestyle choices, including: Daily care of your teeth and gums. Regular physical activity. Eating a healthy diet. Avoiding tobacco and drug use. Limiting alcohol use. Practicing safe sex. Taking low doses of aspirin every day. Taking vitamin and mineral supplements as recommended by your health care provider. What happens during an annual well check? The services and screenings done by your health care provider during your annual well check will depend on your age, overall health, lifestyle risk factors, and family history of disease. Counseling  Your health care provider may ask you questions about your: Alcohol use. Tobacco use. Drug use. Emotional well-being. Home and relationship well-being. Sexual activity. Eating habits. History of falls. Memory and  ability to understand (cognition). Work and work Statistician. Screening  You may have the following tests or measurements: Height, weight, and BMI. Blood pressure. Lipid and cholesterol levels. These may be checked every 5 years, or more frequently if you are over 37 years old. Skin check. Lung cancer screening. You may have this screening every year starting at age 79 if you have a 30-pack-year history of smoking and currently smoke or have quit within the past 15 years. Fecal occult blood test (FOBT) of the stool. You may have this test every year starting at age 79. Flexible sigmoidoscopy or colonoscopy. You may have a sigmoidoscopy every 5 years or a colonoscopy every 10 years starting at age 79. Prostate cancer screening. Recommendations will vary depending on your family history and other risks. Hepatitis C blood test. Hepatitis B blood test. Sexually transmitted disease (STD) testing. Diabetes screening. This is done by checking your blood sugar (glucose) after you have not eaten for a while (fasting). You may have this done every 1-3 years. Abdominal aortic aneurysm (AAA) screening. You may need this if you are a current or former smoker. Osteoporosis. You may be screened starting at age 79 if you are at high risk. Talk with your health care provider about your test results, treatment options, and if necessary, the need for more tests. Vaccines  Your health care provider may recommend certain vaccines, such as: Influenza vaccine. This is recommended every year. Tetanus, diphtheria, and acellular pertussis (Tdap, Td) vaccine. You may need a Td booster every 10 years. Zoster vaccine. You may need this after age 79. Pneumococcal 13-valent conjugate (PCV13) vaccine. One dose is recommended after age 79. Pneumococcal polysaccharide (PPSV23) vaccine. One dose is recommended after age 79. Talk to your health care provider about which screenings and  vaccines you need and how often you need  them. This information is not intended to replace advice given to you by your health care provider. Make sure you discuss any questions you have with your health care provider. Document Released: 08/02/2015 Document Revised: 03/25/2016 Document Reviewed: 05/07/2015 Elsevier Interactive Patient Education  2017 [employer] Prevention in the Home Falls can cause injuries. They can happen to people of all ages. There are many things you can do to make your home safe and to help prevent falls. What can I do on the outside of my home? Regularly fix the edges of walkways and driveways and fix any cracks. Remove anything that might make you trip as you walk through a door, such as a raised step or threshold. Trim any bushes or trees on the path to your home. Use bright outdoor lighting. Clear any walking paths of anything that might make someone trip, such as rocks or tools. Regularly check to see if handrails are loose or broken. Make sure that both sides of any steps have handrails. Any raised decks and porches should have guardrails on the edges. Have any leaves, snow, or ice cleared regularly. Use sand or salt on walking paths during winter. Clean up any spills in your garage right away. This includes oil or grease spills. What can I do in the bathroom? Use night lights. Install grab bars by the toilet and in the tub and shower. Do not use towel bars as grab bars. Use non-skid mats or decals in the tub or shower. If you need to sit down in the shower, use a plastic, non-slip stool. Keep the floor dry. Clean up any water that spills on the floor as soon as it happens. Remove soap buildup in the tub or shower regularly. Attach bath mats securely with double-sided non-slip rug tape. Do not have throw rugs and other things on the floor that can make you trip. What can I do in the bedroom? Use night lights. Make sure that you have a light by your bed that is easy to reach. Do not use  any sheets or blankets that are too big for your bed. They should not hang down onto the floor. Have a firm chair that has side arms. You can use this for support while you get dressed. Do not have throw rugs and other things on the floor that can make you trip. What can I do in the kitchen? Clean up any spills right away. Avoid walking on wet floors. Keep items that you use a lot in easy-to-reach places. If you need to reach something above you, use a strong step stool that has a grab bar. Keep electrical cords out of the way. Do not use floor polish or wax that makes floors slippery. If you must use wax, use non-skid floor wax. Do not have throw rugs and other things on the floor that can make you trip. What can I do with my stairs? Do not leave any items on the stairs. Make sure that there are handrails on both sides of the stairs and use them. Fix handrails that are broken or loose. Make sure that handrails are as long as the stairways. Check any carpeting to make sure that it is firmly attached to the stairs. Fix any carpet that is loose or worn. Avoid having throw rugs at the top or bottom of the stairs. If you do have throw rugs, attach them to the floor with carpet tape. Make  sure that you have a light switch at the top of the stairs and the bottom of the stairs. If you do not have them, ask someone to add them for you. What else can I do to help prevent falls? Wear shoes that: Do not have high heels. Have rubber bottoms. Are comfortable and fit you well. Are closed at the toe. Do not wear sandals. If you use a stepladder: Make sure that it is fully opened. Do not climb a closed stepladder. Make sure that both sides of the stepladder are locked into place. Ask someone to hold it for you, if possible. Clearly mark and make sure that you can see: Any grab bars or handrails. First and last steps. Where the edge of each step is. Use tools that help you move around (mobility aids)  if they are needed. These include: Canes. Walkers. Scooters. Crutches. Turn on the lights when you go into a dark area. Replace any light bulbs as soon as they burn out. Set up your furniture so you have a clear path. Avoid moving your furniture around. If any of your floors are uneven, fix them. If there are any pets around you, be aware of where they are. Review your medicines with your doctor. Some medicines can make you feel dizzy. This can increase your chance of falling. Ask your doctor what other things that you can do to help prevent falls. This information is not intended to replace advice given to you by your health care provider. Make sure you discuss any questions you have with your health care provider. Document Released: 05/02/2009 Document Revised: 12/12/2015 Document Reviewed: 08/10/2014 Elsevier Interactive Patient Education  2017 [employer].

## 2021-03-05 ENCOUNTER — Other Ambulatory Visit: Payer: Self-pay

## 2021-03-05 ENCOUNTER — Telehealth: Payer: Self-pay

## 2021-03-05 ENCOUNTER — Emergency Department (HOSPITAL_COMMUNITY)
Admission: EM | Admit: 2021-03-05 | Discharge: 2021-03-05 | Disposition: A | Discharge status: Home / Self Care | Payer: Medicare HMO | Attending: Emergency Medicine | Admitting: Emergency Medicine

## 2021-03-05 DIAGNOSIS — R42 Dizziness and giddiness: Secondary | ICD-10-CM | POA: Insufficient documentation

## 2021-03-05 DIAGNOSIS — Z79899 Other long term (current) drug therapy: Secondary | ICD-10-CM | POA: Diagnosis not present

## 2021-03-05 DIAGNOSIS — E785 Hyperlipidemia, unspecified: Secondary | ICD-10-CM | POA: Diagnosis not present

## 2021-03-05 DIAGNOSIS — I10 Essential (primary) hypertension: Secondary | ICD-10-CM | POA: Insufficient documentation

## 2021-03-05 DIAGNOSIS — I2510 Atherosclerotic heart disease of native coronary artery without angina pectoris: Secondary | ICD-10-CM | POA: Insufficient documentation

## 2021-03-05 DIAGNOSIS — Z7982 Long term (current) use of aspirin: Secondary | ICD-10-CM | POA: Diagnosis not present

## 2021-03-05 DIAGNOSIS — R002 Palpitations: Secondary | ICD-10-CM | POA: Insufficient documentation

## 2021-03-05 DIAGNOSIS — E1169 Type 2 diabetes mellitus with other specified complication: Secondary | ICD-10-CM | POA: Insufficient documentation

## 2021-03-05 LAB — CBC WITH DIFFERENTIAL/PLATELET
Abs Immature Granulocytes: 0.02 10*3/uL (ref 0.00–0.07)
Basophils Absolute: 0 10*3/uL (ref 0.0–0.1)
Basophils Relative: 1 %
Eosinophils Absolute: 0.3 10*3/uL (ref 0.0–0.5)
Eosinophils Relative: 3 %
HCT: 40.6 % (ref 39.0–52.0)
Hemoglobin: 13.5 g/dL (ref 13.0–17.0)
Immature Granulocytes: 0 %
Lymphocytes Relative: 29 %
Lymphs Abs: 2.3 10*3/uL (ref 0.7–4.0)
MCH: 31.5 pg (ref 26.0–34.0)
MCHC: 33.3 g/dL (ref 30.0–36.0)
MCV: 94.9 fL (ref 80.0–100.0)
Monocytes Absolute: 0.9 10*3/uL (ref 0.1–1.0)
Monocytes Relative: 12 %
Neutro Abs: 4.3 10*3/uL (ref 1.7–7.7)
Neutrophils Relative %: 55 %
Platelets: 281 10*3/uL (ref 150–400)
RBC: 4.28 MIL/uL (ref 4.22–5.81)
RDW: 12.8 % (ref 11.5–15.5)
WBC: 7.8 10*3/uL (ref 4.0–10.5)
nRBC: 0 % (ref 0.0–0.2)

## 2021-03-05 LAB — BASIC METABOLIC PANEL
Anion gap: 7 (ref 5–15)
BUN: 13 mg/dL (ref 8–23)
CO2: 24 mmol/L (ref 22–32)
Calcium: 8.1 mg/dL — ABNORMAL LOW (ref 8.9–10.3)
Chloride: 108 mmol/L (ref 98–111)
Creatinine, Ser: 0.99 mg/dL (ref 0.61–1.24)
GFR, Estimated: >60 mL/min (ref >60)
Glucose, Bld: 125 mg/dL — ABNORMAL HIGH (ref 70–99)
Potassium: 3.5 mmol/L (ref 3.5–5.1)
Sodium: 139 mmol/L (ref 135–145)

## 2021-03-05 LAB — MAGNESIUM: Magnesium: 2.2 mg/dL (ref 1.7–2.4)

## 2021-03-05 NOTE — Telephone Encounter (Signed)
Patient assistance faxed to [employer] for repatha. Will await determination

## 2021-03-05 NOTE — ED Triage Notes (Signed)
Pt states heart has been racing since midnight while trying to go to sleep.

## 2021-03-05 NOTE — ED Provider Notes (Signed)
[facility] EMERGENCY DEPARTMENT Provider Note   CSN: [id number] Arrival date & time: 03/05/21  0345     History Chief Complaint  Patient presents with   Palpitations    Douglas Keller is a 80 y.o. male.   Palpitations Palpitations quality:  Regular Onset quality:  Gradual Duration:  3 hours Timing:  Constant Progression:  Resolved Chronicity:  New Context: not anxiety, not appetite suppressants, not blood loss, not bronchodilators, not caffeine, not dehydration, not exercise and not stimulant use   Relieved by:  Nothing Worsened by:  Nothing Ineffective treatments:  None tried Associated symptoms: dizziness   Associated symptoms: no back pain, no chest pain, no nausea, no near-syncope and no syncope       Past Medical History:  Diagnosis Date   Aortic valve sclerosis    Atherosclerosis of coronary artery without angina pectoris 07/05/2014   Cervical spondylosis without myelopathy 02/13/2013   Chest pain 01/29/2019   Degeneration of cervical intervertebral disc 02/13/2013   Depression    sometimes   Diabetes mellitus type 2, diet-controlled (HCC)    Elevated troponin    GERD (gastroesophageal reflux disease)    Heart murmur    HTN (hypertension) 07/06/2013   Hypercholesterolemia 08/21/2015   Hyperlipidemia 07/06/2013   Medication management 08/21/2015   Pre-diabetes    Rectal bleeding 09/21/2019   Stiffness of left shoulder joint 07/19/2013    Patient Active Problem List   Diagnosis Date Noted   Left knee pain 10/30/2020   Decreased GFR 07/31/2020   Spinal stenosis of lumbar region with neurogenic claudication 12/27/2019   Spinal stenosis of lumbar region 11/29/2019   Neck and shoulder pain 09/05/2019   Other insomnia 08/22/2019   Dizzy spells 08/01/2019   Depression, major, single episode, moderate (HCC) 07/07/2019   Obesity (BMI 30.0-34.9) 06/21/2019   Gastroesophageal reflux disease    Type 2 diabetes mellitus with hyperlipidemia (HCC) 09/01/2018    Heterozygous familial hypercholesterolemia 09/01/2018   Atherosclerosis of coronary artery without angina pectoris 07/05/2014   Hyperlipidemia 07/06/2013   HTN (hypertension) 07/06/2013   Spinal stenosis in cervical region 02/13/2013    Past Surgical History:  Procedure Laterality Date   BIOPSY  12/14/2019   Procedure: BIOPSY;  Surgeon: Rourk, Robert M, MD;  Location: [facility];  Service: Endoscopy;;   CERVICAL SPINE SURGERY     COLONOSCOPY  07/26/2012   Procedure: COLONOSCOPY;  Surgeon: Mark A Jenkins, MD;  Location: [facility];  Service: Gastroenterology;  Laterality: N/A;   COLONOSCOPY WITH PROPOFOL N/A 12/14/2019   Procedure: COLONOSCOPY WITH PROPOFOL;  Surgeon: Rourk, Robert M, MD;  Location: [facility];  Service: Endoscopy;  Laterality: N/A;  9:00am   KNEE ARTHROSCOPY Left    torn ligament   LUMBAR LAMINECTOMY/DECOMPRESSION MICRODISCECTOMY Bilateral 12/27/2019   Procedure: BIATERAL LUMBAR TWO- LUMBAR THREE, LUMBAR THREE- LUMBAR FOUR, LUMBAR FOUR-FIVE LAMINOTOMY/FORAMINOTOMY 3 LEVELS;  Surgeon: Jenkins, Jeffrey, MD;  Location: [facility] OR;  Service: Neurosurgery;  Laterality: Bilateral;  POSTERIOR   SPINE SURGERY N/A    Phreesia 02/24/2020       Family History  Problem Relation Age of Onset   Asthma Mother    Hypertension Father    Colon cancer Neg Hx     Social History   Tobacco Use   Smoking status: Never   Smokeless tobacco: Never  Vaping Use   Vaping Use: Never used  Substance Use Topics   Alcohol use: No   Drug use: No    Home Medications Prior to   Admission medications   Medication Sig Start Date End Date Taking? Authorizing Provider  acetaminophen (TYLENOL) 650 MG CR tablet Take 1,300 mg by mouth every 8 (eight) hours as needed for pain.     [provider]  amLODipine (NORVASC) 10 MG tablet Take 1 tablet (10 mg total) by mouth daily. 10/30/20   Noreene Larsson, NP  aspirin EC 81 MG tablet Take 81 mg by mouth daily.    [provider]   benazepril (LOTENSIN) 40 MG tablet Take 1 tablet (40 mg total) by mouth daily. 02/19/21   Croitoru, Dani Gobble, MD  blood glucose meter kit and supplies Dispense based on patient and insurance preference. Use up to four times daily as directed. (FOR ICD-10 E10.9, E11.9). 02/26/21   Noreene Larsson, NP  buPROPion (WELLBUTRIN XL) 150 MG 24 hr tablet Take 1 tablet (150 mg total) by mouth daily. 10/30/20   Noreene Larsson, NP  Evolocumab (REPATHA SURECLICK) 035 MG/ML SOAJ INJECT 140 MG INTO THE SKIN EVERY 14 DAY 02/21/21   Croitoru, Mihai, MD  gabapentin (NEURONTIN) 300 MG capsule TAKE 1 CAPSULE THREE TIMES DAILY 10/30/20   Noreene Larsson, NP  glipiZIDE (GLIPIZIDE XL) 2.5 MG 24 hr tablet Take 1 tablet (2.5 mg total) by mouth daily with breakfast. 02/26/21   Noreene Larsson, NP  magnesium oxide (MAG-OX) 400 MG tablet Take 400 mg by mouth daily.    [provider]  meclizine (ANTIVERT) 25 MG tablet Take 1 tablet (25 mg total) by mouth 3 (three) times daily as needed for dizziness. 12/10/20   Noreene Larsson, NP  meloxicam (MOBIC) 7.5 MG tablet Take 1 tablet (7.5 mg total) by mouth daily. 12/23/20   Carole Civil, MD  metoprolol succinate (TOPROL-XL) 25 MG 24 hr tablet Take 1 tablet (25 mg total) by mouth daily. Take with or immediately following a meal. 02/19/21 05/20/21  Croitoru, Mihai, MD  multivitamin-iron-minerals-folic acid (CENTRUM) chewable tablet Chew 1 tablet by mouth daily.    [provider]  pantoprazole (PROTONIX) 40 MG tablet Take 1 tablet (40 mg total) by mouth daily. 10/30/20   Noreene Larsson, NP  spironolactone-hydrochlorothiazide (ALDACTAZIDE) 25-25 MG tablet Take 1 tablet by mouth every other day. 02/19/21   Croitoru, Mihai, MD  traZODone (DESYREL) 50 MG tablet Take 0.5-1 tablets (25-50 mg total) by mouth at bedtime as needed for sleep. 01/28/21   Noreene Larsson, NP    Allergies    Metformin and related and Statins  Review of Systems   Review of Systems  Cardiovascular:  Positive  for palpitations. Negative for chest pain, syncope and near-syncope.  Gastrointestinal:  Negative for nausea.  Musculoskeletal:  Negative for back pain.  Neurological:  Positive for dizziness.  All other systems reviewed and are negative.  Physical Exam Updated Vital Signs BP (!) 151/95   Pulse (!) 50   Temp 98.6 F (37 C) (Oral)   Resp (!) 28   Ht 5' 6" (1.676 m)   Wt 96 kg   SpO2 100%   BMI 34.16 kg/m   Physical Exam Vitals and nursing note reviewed.  HENT:     Nose: Nose normal. No congestion or rhinorrhea.     Mouth/Throat:     Mouth: Mucous membranes are moist.  Eyes:     Pupils: Pupils are equal, round, and reactive to light.  Musculoskeletal:        General: No swelling or tenderness. Normal range of motion.  Skin:  General: Skin is warm and dry.  Neurological:     General: No focal deficit present.    ED Results / Procedures / Treatments   Labs (all labs ordered are listed, but only abnormal results are displayed) Labs Reviewed  BASIC METABOLIC PANEL - Abnormal; Notable for the following components:      Result Value   Glucose, Bld 125 (*)    Calcium 8.1 (*)    All other components within normal limits  CBC WITH DIFFERENTIAL/PLATELET  MAGNESIUM    EKG None  Radiology No results found.  Procedures Procedures   Medications Ordered in ED Medications - No data to display  ED Course  I have reviewed the triage vital signs and the nursing notes.  Pertinent labs & imaging results that were available during my care of the patient were reviewed by me and considered in my medical decision making (see chart for details).    MDM Rules/Calculators/A&P                         Here with palpitations. Resolved prior to arrival here. Similar to episodes in past. Being followed by cardiolgoy for same. Will check lytes and hemoglobin and monitor.    Final Clinical Impression(s) / ED Diagnoses Final diagnoses:  Palpitations    Rx / DC Orders ED  Discharge Orders     None        Mesner, Jason, MD 03/05/21 0616  

## 2021-03-10 NOTE — Telephone Encounter (Signed)
Called and spoke w/pt stated that we need them to contact [employer] and get an approval letter sent to them and then them bring it into the office so I can fax it to [employer] as that is the missing information that they are requesting. I completed the pa but the letter didn't populate

## 2021-03-26 ENCOUNTER — Telehealth: Payer: Self-pay | Admitting: Cardiovascular Disease

## 2021-03-26 NOTE — Telephone Encounter (Signed)
Patient c/o Palpitations:  High priority if patient c/o lightheadedness, shortness of breath, or chest pain  How long have you had palpitations/irregular HR/ Afib? Are you having the symptoms now? Irregular heart off and on  Are you currently experiencing lightheadedness, SOB or CP? Lightheaded and shortness of breath, but not a this time  Do you have a history of afib (atrial fibrillation) or irregular heart rhythm?   Have you checked your BP or HR? (document readings if available): up and down  Are you experiencing any other symptoms? Patient says he wants to be seen

## 2021-03-26 NOTE — Telephone Encounter (Signed)
Spoke with patient and he has been having his same episodes where his HR gets low  Recent ED visit and told to follow up  Stated he hears his heartbeat in his ear When asked patient if this is what has been going on stated yes but HR seems to be lower but seems to be jumping all around  He does have episodes of feeling dizzy/clammy at times Patient confirmed taking his Metoprolol  Scheduled appointment for next week  Will forward to Dr Sallyanne Kuster for review and will call back if any further recommendations

## 2021-03-27 ENCOUNTER — Other Ambulatory Visit: Payer: Self-pay

## 2021-03-27 DIAGNOSIS — M48062 Spinal stenosis, lumbar region with neurogenic claudication: Secondary | ICD-10-CM

## 2021-03-27 MED ORDER — GABAPENTIN 300 MG PO CAPS
ORAL_CAPSULE | ORAL | 0 refills | Status: DC
Start: 2021-03-27 — End: 2021-05-29

## 2021-04-01 ENCOUNTER — Ambulatory Visit: Payer: Medicare HMO | Admitting: Nurse Practitioner

## 2021-04-02 ENCOUNTER — Other Ambulatory Visit: Payer: Self-pay

## 2021-04-02 ENCOUNTER — Ambulatory Visit: Payer: Medicare HMO | Admitting: Cardiovascular Disease

## 2021-04-02 ENCOUNTER — Encounter: Payer: Self-pay | Admitting: Cardiovascular Disease

## 2021-04-02 VITALS — BP 162/64 | HR 36 | Ht 66.0 in | Wt 214.4 lb

## 2021-04-02 DIAGNOSIS — E1169 Type 2 diabetes mellitus with other specified complication: Secondary | ICD-10-CM

## 2021-04-02 DIAGNOSIS — I10 Essential (primary) hypertension: Secondary | ICD-10-CM | POA: Diagnosis not present

## 2021-04-02 DIAGNOSIS — E7801 Familial hypercholesterolemia: Secondary | ICD-10-CM | POA: Diagnosis not present

## 2021-04-02 DIAGNOSIS — E669 Obesity, unspecified: Secondary | ICD-10-CM | POA: Diagnosis not present

## 2021-04-02 DIAGNOSIS — I493 Ventricular premature depolarization: Secondary | ICD-10-CM

## 2021-04-02 DIAGNOSIS — I5042 Chronic combined systolic (congestive) and diastolic (congestive) heart failure: Secondary | ICD-10-CM

## 2021-04-02 DIAGNOSIS — I2510 Atherosclerotic heart disease of native coronary artery without angina pectoris: Secondary | ICD-10-CM | POA: Diagnosis not present

## 2021-04-02 MED ORDER — SPIRONOLACTONE-HCTZ 25-25 MG PO TABS: 1.0000 | ORAL_TABLET | Freq: Every day | ORAL | 1 refills | Status: DC

## 2021-04-02 NOTE — Patient Instructions (Addendum)
Medication Instructions:  Go back to taking spironolactone-hydrochlorothiazide 25/25 mg once daily, every day *If you need a refill on your cardiac medications before your next appointment, please call your pharmacy*   Lab Work: None ordered If you have labs (blood work) drawn today and your tests are completely normal, you will receive your results only by: [place] (if you have MyChart) OR A paper copy in the mail If you have any lab test that is abnormal or we need to change your treatment, we will call you to review the results.   Testing/Procedures: None ordered   Follow-Up: At [facility], you and your health needs are our priority.  As part of our continuing mission to provide you with exceptional heart care, we have created designated Provider Care Teams.  These Care Teams include your primary Cardiologist (physician) and Advanced Practice Providers (APPs -  Physician Assistants and Nurse Practitioners) who all work together to provide you with the care you need, when you need it.  We recommend signing up for the patient portal called "MyChart".  Sign up information is provided on this After Visit Summary.  MyChart is used to connect with patients for Virtual Visits (Telemedicine).  Patients are able to view lab/test results, encounter notes, upcoming appointments, etc.  Non-urgent messages can be sent to your provider as well.   To learn more about what you can do with MyChart, go to [email or web address].    Your next appointment:   3 month(s)  The format for your next appointment:   In Person  Provider:   You may see Sanda Klein, MD or one of the following Advanced Practice Providers on your designated Care Team:   Almyra Deforest, PA-C Fabian Sharp, [place] or  Roby Lofts, [place]   Other Instructions A referral has been placed to Electrophysiology

## 2021-04-02 NOTE — Progress Notes (Signed)
[name], male   DOB: [birth year], 80 y.o.   MRN: [id number]    Cardiology Office Note    Date:  04/02/2021   ID:  Douglas Keller, DOB 1940-12-01, MRN [id number]  PCP:  Noreene Larsson, NP  Cardiologist:   Sanda Klein, MD   No chief complaint on file.   History of Present Illness:  [name] is a 80 y.o. male with hyperlipidemia, hypertension, moderate coronary atherosclerosis and a murmur due to aortic valve sclerosis without stenosis.  Most recently, his problems have been related to very frequent PVCs that often occur in a pattern of bigeminy causing effective bradycardia in the 30s.  This led to an emergency room visit on July 4.  An event monitor showed that 60% of all QRS complexes are PVCs and included brief episodes of nonsustained VT as well as bigeminy and trigeminy.  We tried doubling his a beta-blocker.  His resting heart rate is now around 50 bpm.  Despite this he continues to have periods of extreme fatigue which appear to correlate with frequent PVCs.  These will occur unpredictably.  He was able to work in the yard without any symptoms for a while this weekend, but then all of a sudden developed weakness, dizziness and near syncope and nausea/ vomiting.  He does not have chest pain at rest with activity, shortness of breath, orthopnea, PND, leg edema, claudication or other focal neurological complaints.  When I increase his beta-blocker at his last appointment asked him to cut the spironolactone-hydrochlorothiazide to every other day since I was concerned about possible hypotension.  Unfortunately this has led to his blood pressure increasing to the 160-170/70-80s.  He did not tolerate treatment with chlorthalidone due to severe muscle cramps, but is doing well on the spironolactone-hydrochlorothiazide combination.  Treatment with furosemide was discontinued due to worsening renal function.  Most recent creatinine is 0.99 on 03/05/2021 potassium was 3.9 on  02/25/2021 but 3.5 on 03/05/2021.  An event monitor in November 2018 showed relative bradycardia and a suggestion of chronotropic incompetence.  He did not have any symptoms and a pacemaker was not necessary.  His echocardiogram in 2020 showed borderline LVEF of 45-50% (similar to EF 48% by nuclear stress test in 2016) and impaired relaxation  He was intolerant of multiple statins even when we tried to supplement coenzyme Q 10.  He is now on Repatha and tolerating it very well, with excellent LDL level.  Cardiac catheterization in 2006 showed  non obstructive atherosclerosis (40% proximal LAD, 40% mid RCA) and had nuclear stress test in 2018 showed normal perfusion, LVEF was only 48% .  Normal ABI bilaterally February 2020  Past Medical History:  Diagnosis Date   Aortic valve sclerosis    Atherosclerosis of coronary artery without angina pectoris 07/05/2014   Cervical spondylosis without myelopathy 02/13/2013   Chest pain 01/29/2019   Degeneration of cervical intervertebral disc 02/13/2013   Depression    sometimes   Diabetes mellitus type 2, diet-controlled (HCC)    Elevated troponin    GERD (gastroesophageal reflux disease)    Heart murmur    HTN (hypertension) 07/06/2013   Hypercholesterolemia 08/21/2015   Hyperlipidemia 07/06/2013   Medication management 08/21/2015   Pre-diabetes    Rectal bleeding 09/21/2019   Stiffness of left shoulder joint 07/19/2013    Past Surgical History:  Procedure Laterality Date   BIOPSY  12/14/2019   Procedure: BIOPSY;  Surgeon: Daneil Dolin, MD;  [facility];  Service:  Endoscopy;;   CERVICAL SPINE SURGERY     COLONOSCOPY  07/26/2012   Procedure: COLONOSCOPY;  Surgeon: Jamesetta So, MD;  [facility]: [facility];  Service: Gastroenterology;  Laterality: N/A;   COLONOSCOPY WITH PROPOFOL N/A 12/14/2019   Procedure: COLONOSCOPY WITH PROPOFOL;  Surgeon: Daneil Dolin, MD;  Location: [facility];  Service: Endoscopy;  Laterality: N/A;  9:00am    KNEE ARTHROSCOPY Left    torn ligament   LUMBAR LAMINECTOMY/DECOMPRESSION MICRODISCECTOMY Bilateral 12/27/2019   Procedure: BIATERAL LUMBAR TWO- LUMBAR THREE, LUMBAR THREE- LUMBAR FOUR, LUMBAR FOUR-FIVE LAMINOTOMY/FORAMINOTOMY 3 LEVELS;  Surgeon: Newman Pies, MD;  Location: [facility] [facility];  Service: Neurosurgery;  Laterality: Bilateral;  POSTERIOR   SPINE SURGERY N/A    Phreesia 02/24/2020    Outpatient Medications Prior to Visit  Medication Sig Dispense Refill   acetaminophen (TYLENOL) 650 MG CR tablet Take 1,300 mg by mouth every 8 (eight) hours as needed for pain.      amLODipine (NORVASC) 10 MG tablet Take 1 tablet (10 mg total) by mouth daily. 90 tablet 1   aspirin EC 81 MG tablet Take 81 mg by mouth daily.     benazepril (LOTENSIN) 40 MG tablet Take 1 tablet (40 mg total) by mouth daily. 90 tablet 1   blood glucose meter kit and supplies Dispense based on patient and insurance preference. Use up to four times daily as directed. (FOR ICD-10 E10.9, E11.9). 1 each 0   buPROPion (WELLBUTRIN XL) 150 MG 24 hr tablet Take 1 tablet (150 mg total) by mouth daily. 90 tablet 2   Evolocumab (REPATHA SURECLICK) 332 MG/ML SOAJ INJECT 140 MG INTO THE SKIN EVERY 14 DAY 2 mL 11   gabapentin (NEURONTIN) 300 MG capsule TAKE 1 CAPSULE THREE TIMES DAILY 90 capsule 0   glipiZIDE (GLIPIZIDE XL) 2.5 MG 24 hr tablet Take 1 tablet (2.5 mg total) by mouth daily with breakfast. 90 tablet 1   magnesium oxide (MAG-OX) 400 MG tablet Take 400 mg by mouth daily.     meclizine (ANTIVERT) 25 MG tablet Take 1 tablet (25 mg total) by mouth 3 (three) times daily as needed for dizziness. 30 tablet 0   meloxicam (MOBIC) 7.5 MG tablet Take 1 tablet (7.5 mg total) by mouth daily. 90 tablet 5   metoprolol succinate (TOPROL-XL) 25 MG 24 hr tablet Take 1 tablet (25 mg total) by mouth daily. Take with or immediately following a meal. 90 tablet 1   multivitamin-iron-minerals-folic acid (CENTRUM) chewable tablet Chew 1 tablet by mouth  daily.     pantoprazole (PROTONIX) 40 MG tablet Take 1 tablet (40 mg total) by mouth daily. 90 tablet 2   spironolactone-hydrochlorothiazide (ALDACTAZIDE) 25-25 MG tablet Take 1 tablet by mouth every other day. 90 tablet 1   traZODone (DESYREL) 50 MG tablet Take 0.5-1 tablets (25-50 mg total) by mouth at bedtime as needed for sleep. (Patient not taking: Reported on 04/02/2021) 30 tablet 3   No facility-administered medications prior to visit.     Allergies:   Metformin and related and Statins   Social History   Socioeconomic History   Marital status: Widowed    Spouse name: Cherly    Number of children: 1   Years of education: Not on file   Highest education level: 12th grade  Occupational History    Comment: Retired   Tobacco Use   Smoking status: Never   Smokeless tobacco: Never  Vaping Use   Vaping Use: Never used  Substance and Sexual Activity  Alcohol use: No   Drug use: No   Sexual activity: Not Currently  Other Topics Concern   Not on file  Social History Narrative   Live alone right now, wife passed in 2009    [place] live in [place]      Enjoys playing golf      Diet: eats what he likes, sweet, fast food, does not eat a lot of canned or boxed foods.    Does not eat enough fruits and veggies.       Caffeine: 1 cup daily   Water: 3-4 bottles of water daily, Gatorade every now and then         Wears seat belt   Wears sun protection when golfing   Smoke detectors at home   Does not use phone while driving   Social Determinants of Health   Financial Resource Strain: Low Risk    Difficulty of Paying Living Expenses: Not hard at all  Food Insecurity: No Food Insecurity   Worried About Charity fundraiser in the Last Year: Never true   Arboriculturist in the Last Year: Never true  Transportation Needs: No Transportation Needs   Lack of Transportation (Medical): No   Lack of Transportation (Non-Medical): No  Physical Activity: Inactive    Days of Exercise per Week: 0 days   Minutes of Exercise per Session: 0 min  Stress: No Stress Concern Present   Feeling of Stress : Not at all  Social Connections: Moderately Isolated   Frequency of Communication with Friends and Family: More than three times a week   Frequency of Social Gatherings with Friends and Family: More than three times a week   Attends Religious Services: More than 4 times per year   Active Member of [employer] or Organizations: No   Attends Archivist Meetings: Never   Marital Status: Widowed     Family History:  The patient's family history includes Asthma in his mother; Hypertension in his father.   ROS:   Please see the history of present illness.    All other systems are reviewed and are negative.  PHYSICAL EXAM:   VS:  BP (!) 162/64 (BP Location: Left Arm, Patient Position: Sitting, Cuff Size: Large)   Pulse (!) 36   Ht 5' 6"  (1.676 m)   Wt 214 lb 6.4 oz (97.3 kg)   SpO2 97%   BMI 34.61 kg/m     Auscultation, heart rate of 72 bpm.  By peripheral pulse, heart rate 36 bpm.  General: Alert, oriented x3, no distress, moderately obese Head: no evidence of trauma, PERRL, EOMI, no exophtalmos or lid lag, no myxedema, no xanthelasma; normal ears, nose and oropharynx Neck: normal jugular venous pulsations and no hepatojugular reflux; brisk carotid pulses without delay and no carotid bruits Chest: clear to auscultation, no signs of consolidation by percussion or palpation, normal fremitus, symmetrical and full respiratory excursions Cardiovascular: normal position and quality of the apical impulse, bigeminal rhythm, normal first and second heart sounds, no murmurs, rubs or gallops Abdomen: no tenderness or distention, no masses by palpation, no abnormal pulsatility or arterial bruits, normal bowel sounds, no hepatosplenomegaly Extremities: no clubbing, cyanosis or edema; 2+ radial, ulnar and brachial pulses bilaterally; 2+ right femoral, posterior  tibial and dorsalis pedis pulses; 2+ left femoral, posterior tibial and dorsalis pedis pulses; no subclavian or femoral bruits Neurological: grossly nonfocal Psych: Normal mood and affect     Wt Readings from Last  3 Encounters:  04/02/21 214 lb 6.4 oz (97.3 kg)  03/05/21 211 lb 10.3 oz (96 kg)  02/25/21 212 lb (96.2 kg)      Studies/Labs Reviewed:   EKG:  EKG is not ordered today.  Personally reviewed the tracing from his ER visit on 01/20/2021.  This shows sinus rhythm with ventricular bigeminy (monomorphic PVCs).  There appears to be an RBBB pattern in V1 and V2 with secondary polarization abnormalities.  14-day event monitor 02/06/2021 The dominant rhythm was normal sinus with a normal circadian rhythm variation. There are extremely frequent premature ventricular beats (representing 15.7% of all QRS complexes), with frequent ventricular couplets and rare episodes of 4-7 beat runs of nonsustained ventricular tachycardia. Although multiple PVC morphologies are seen, there is a dominant PVC morphology and occasional bigeminy and trigeminy are seen There is a remarkable reduction, almost complete abolition of ventricular ectopy during sleep Atrial ectopy is rare and there is no atrial fibrillation. A 5 beat run of atrial tachycardia seen. Patient diary was not submitted. There is mild expected bradycardia during sleep. There are no episodes of severe bradycardia or sinus pauses.   Abnormal 14-day event monitor due to the presence of extremely frequent PVCs and ventricular couplets, occasionally in the form of very brief nonsustained VT. There is a remarkable reduction in ventricular ectopy burden during sleep.  Echocardiogram 05/17/2020: 1. Left ventricular ejection fraction, by estimation, is 55%. The left ventricle has normal function. The left ventricle has no regional wall motion abnormalities. There is mild left ventricular hypertrophy. Left ventricular diastolic parameters are  consistent with Grade I diastolic dysfunction (impaired relaxation). Elevated left atrial pressure. 2. Right ventricular systolic function is normal. The right ventricular size is normal. 3. The mitral valve is normal in structure. No evidence of mitral valve regurgitation. No evidence of mitral stenosis. 4. The aortic valve is tricuspid. There is mild calcification of the aortic valve. There is mild thickening of the aortic valve. Aortic valve regurgitation is mild. No aortic stenosis is present. 5. The inferior vena cava is normal in size with greater than 50% respiratory variability,suggesting right atrial pressure of 3 mmHg.  Nuclear stress test 01/30/2019:  There was no ST segment deviation noted during stress. This is a low risk study. Nuclear stress EF: 47%. Defect 1: There is a medium defect of mild severity present in the basal inferior and mid inferior location. This is likely due to soft tissue attenuation. No large ischemic territories.  Recent Labs: 05/14/2020: NT-Pro BNP 25 02/25/2021: ALT 20; TSH 1.660 03/05/2021: BUN 13; Creatinine, Ser 0.99; Hemoglobin 13.5; Magnesium 2.2; Platelets 281; Potassium 3.5; Sodium 139  BMET    Component Value Date/Time   NA 139 03/05/2021 0410   NA 144 02/25/2021 0837   K 3.5 03/05/2021 0410   CL 108 03/05/2021 0410   CO2 24 03/05/2021 0410   GLUCOSE 125 (H) 03/05/2021 0410   BUN 13 03/05/2021 0410   BUN 11 02/25/2021 0837   CREATININE 0.99 03/05/2021 0410   CREATININE 1.13 06/28/2019 0830   CALCIUM 8.1 (L) 03/05/2021 0410   GFRNONAA >60 03/05/2021 0410   GFRNONAA 62 06/28/2019 0830   GFRAA 53 (L) 07/24/2020 0913   GFRAA 72 06/28/2019 0830    Lipid Panel    Component Value Date/Time   CHOL 126 02/25/2021 0837   TRIG 165 (H) 02/25/2021 0837   HDL 44 02/25/2021 0837   CHOLHDL 2.9 08/01/2020 0852   CHOLHDL 3.3 01/29/2019 0800   VLDL 30 01/29/2019 0800  LDLCALC 54 02/25/2021 0837     ASSESSMENT:    1. PVCs (premature  ventricular contractions)   2. Chronic combined systolic and diastolic heart failure ([place])   3. Essential hypertension   4. Heterozygous familial hypercholesterolemia   5. Coronary artery disease involving native coronary artery of native heart without angina pectoris   6. Diabetes mellitus type 2 in obese ([place])   7. Primary hypertension     PLAN:  In order of problems listed above:  PVCs/NSVT: He continues to have symptomatic effective bradycardia due to incessant ventricular bigeminy.  Starting a beta-blocker has not helped.  I do not think we can push it any further, since when he is in sinus rhythm the rate is 50 bpm.   There was no significant true sinus bradycardia or sinus pauses on his 14-day event monitor.  We had previously avoided beta-blockers due to perceived chronotropic incompetence as documented on his event monitor from 2018.  At this point there are no appealing conservative methods left to treat him.  We could consider treatment with flecainide or sotalol to suppress the PVCs, but this may lead to severe bradycardia or proarrhythmic events.  We could increase beta-blocker dose and implanted a dual-chamber permanent pacemaker.  Consider EP study and radiofrequency ablation of the PVCs: Although the PVCs do not have typical RVOT morphology, they do have a monomorphic LBBB pattern, suggesting they might be originating in the right ventricle and therefore accessible to radiofrequency ablation.  Before making any additional recommendations, I think we should have an opinion from an electrophysiologist. CHF: Clinically euvolemic, not requiring loop diuretics. His most recent echocardiogram showed LVEF 55%, on his nuclear stress test that was 47%.  He does have some evidence of diastolic dysfunction on the echo. HTN: Now not well controlled after we reduced his spironolactone-hydrochlorothiazide, increase back to daily dosing. 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   Excellent response to Repatha.  No side effects with Repatha.  He is due for refill.   CAD: He does not have angina pectoris and did not have any severe obstructive lesions when he had his coronary angiogram or perfusion defects on his last stress test. DM/obesity: Well-controlled.  Last hemoglobin A1c 6.6%. Bilateral leg pain: Appears to have neurogenic claudication, on gabapentin.  He had normal ABI , February 2020.    Medication Adjustments/Labs and Tests Ordered: Current medicines are reviewed at length with the patient today.  Concerns regarding medicines are outlined above.  Medication changes, Labs and Tests ordered today are listed in the Patient Instructions below. Patient Instructions  Medication Instructions:  Go back to taking spironolactone-hydrochlorothiazide 25/25 mg once daily, every day *If you need a refill on your cardiac medications before your next appointment, please call your pharmacy*   Lab Work: None ordered If you have labs (blood work) drawn today and your tests are completely normal, you will receive your results only by: [place] (if you have MyChart) OR A paper copy in the mail If you have any lab test that is abnormal or we need to change your treatment, we will call you to review the results.   Testing/Procedures: None ordered   Follow-Up: At [facility], you and your health needs are our priority.  As part of our continuing mission to provide you with exceptional heart care, we have created designated Provider Care Teams.  These Care Teams include your primary Cardiologist (physician) and Advanced Practice Providers (APPs -  Physician Assistants and Nurse Practitioners) who all work  together to provide you with the care you need, when you need it.  We recommend signing up for the patient portal called "MyChart".  Sign up information is provided on this After Visit Summary.  MyChart is used to connect with patients for Virtual Visits  (Telemedicine).  Patients are able to view lab/test results, encounter notes, upcoming appointments, etc.  Non-urgent messages can be sent to your provider as well.   To learn more about what you can do with MyChart, go to [email or web address].    Your next appointment:   3 month(s)  The format for your next appointment:   In Person  Provider:   You may see Sanda Klein, MD or one of the following Advanced Practice Providers on your designated Care Team:   Almyra Deforest, PA-C Fabian Sharp, [place] or  Roby Lofts, [place]   Other Instructions A referral has been placed to Electrophysiology      Signed, Sanda Klein, MD  04/02/2021 12:47 PM    [address], [place], [place]  [address] Phone: [phone]; Fax: [phone]

## 2021-04-03 ENCOUNTER — Encounter: Payer: Self-pay | Admitting: Cardiology

## 2021-04-03 ENCOUNTER — Ambulatory Visit: Payer: Medicare HMO | Admitting: Cardiology

## 2021-04-03 VITALS — BP 126/62 | HR 64 | Ht 66.0 in | Wt 212.0 lb

## 2021-04-03 DIAGNOSIS — I493 Ventricular premature depolarization: Secondary | ICD-10-CM

## 2021-04-03 DIAGNOSIS — I5042 Chronic combined systolic (congestive) and diastolic (congestive) heart failure: Secondary | ICD-10-CM

## 2021-04-03 DIAGNOSIS — Z01812 Encounter for preprocedural laboratory examination: Secondary | ICD-10-CM

## 2021-04-03 DIAGNOSIS — I10 Essential (primary) hypertension: Secondary | ICD-10-CM

## 2021-04-03 NOTE — Patient Instructions (Signed)
Medication Instructions:  Your physician recommends that you continue on your current medications as directed. Please refer to the Current Medication list given to you today.  *If you need a refill on your cardiac medications before your next appointment, please call your pharmacy*   Lab Work: CBC w/diff and BMET today  If you have labs (blood work) drawn today and your tests are completely normal, you will receive your results only by: [place] (if you have MyChart) OR A paper copy in the mail If you have any lab test that is abnormal or we need to change your treatment, we will call you to review the results.   Testing/Procedures: Your physician has requested that you have an echocardiogram. Echocardiography is a painless test that uses sound waves to create images of your heart. It provides your doctor with information about the size and shape of your heart and how well your heart's chambers and valves are working. This procedure takes approximately one hour. There are no restrictions for this procedure.   Your physician has recommended that you have an ablation. Catheter ablation is a medical procedure used to treat some cardiac arrhythmias (irregular heartbeats). During catheter ablation, a long, thin, flexible tube is put into a blood vessel in your groin (upper thigh), or neck. This tube is called an ablation catheter. It is then guided to your heart through the blood vessel. Radio frequency waves destroy small areas of heart tissue where abnormal heartbeats may cause an arrhythmia to start. Please see the instruction sheet given to you today.    Follow-Up: At [facility], you and your health needs are our priority.  As part of our continuing mission to provide you with exceptional heart care, we have created designated Provider Care Teams.  These Care Teams include your primary Cardiologist (physician) and Advanced Practice Providers (APPs -  Physician Assistants and Nurse  Practitioners) who all work together to provide you with the care you need, when you need it.  We recommend signing up for the patient portal called "MyChart".  Sign up information is provided on this After Visit Summary.  MyChart is used to connect with patients for Virtual Visits (Telemedicine).  Patients are able to view lab/test results, encounter notes, upcoming appointments, etc.  Non-urgent messages can be sent to your provider as well.   To learn more about what you can do with MyChart, go to [email or web address].    Your next appointment:   To be scheduled

## 2021-04-03 NOTE — H&P (View-Only) (Signed)
Electrophysiology Office Note:    Date:  04/03/2021   ID:  Nox Housey, DOB 10/01/1940, MRN [id number]  PCP:  Gray, Joseph M, NP  [facility] Cardiologist:  Mihai Croitoru, MD  [facility] Electrophysiologist:  Amariz Flamenco T Makiya Jeune, MD   Referring MD: Croitoru, Mihai, MD   Chief Complaint: PVC  History of Present Illness:    Douglas Keller is a 80 y.o. male who presents for an evaluation of PVCs at the request of Dr Croitoru. Their medical history includes hypertension, hyperlipidemia, CAD and aortic valve sclerosis without stenosis.  He saw Dr. Croitoru on February 19, 2021 for his PVCs.  At that appointment they reviewed a recent event monitor from July 2022 which showed 15.7% burden of PVCs.  These PVCs are symptomatic due to the nonperfused nature of the PVCs.  He had a presentation to the emergency department on March 05, 2021 for palpitations that resolved after he arrived to the emergency department.  He saw Dr. Croitoru again on April 02, 2021.  This followed a repeat event monitor which showed a high PVC burden and nonsustained/bigeminal/trigeminal PVCs.     Past Medical History:  Diagnosis Date   Aortic valve sclerosis    Atherosclerosis of coronary artery without angina pectoris 07/05/2014   Cervical spondylosis without myelopathy 02/13/2013   Chest pain 01/29/2019   Degeneration of cervical intervertebral disc 02/13/2013   Depression    sometimes   Diabetes mellitus type 2, diet-controlled (HCC)    Elevated troponin    GERD (gastroesophageal reflux disease)    Heart murmur    HTN (hypertension) 07/06/2013   Hypercholesterolemia 08/21/2015   Hyperlipidemia 07/06/2013   Medication management 08/21/2015   Pre-diabetes    Rectal bleeding 09/21/2019   Stiffness of left shoulder joint 07/19/2013    Past Surgical History:  Procedure Laterality Date   BIOPSY  12/14/2019   Procedure: BIOPSY;  Surgeon: Rourk, Robert M, MD;  Location: [facility];  Service: Endoscopy;;    CERVICAL SPINE SURGERY     COLONOSCOPY  07/26/2012   Procedure: COLONOSCOPY;  Surgeon: Mark A Jenkins, MD;  Location: [facility];  Service: Gastroenterology;  Laterality: N/A;   COLONOSCOPY WITH PROPOFOL N/A 12/14/2019   Procedure: COLONOSCOPY WITH PROPOFOL;  Surgeon: Rourk, Robert M, MD;  Location: [facility];  Service: Endoscopy;  Laterality: N/A;  9:00am   KNEE ARTHROSCOPY Left    torn ligament   LUMBAR LAMINECTOMY/DECOMPRESSION MICRODISCECTOMY Bilateral 12/27/2019   Procedure: BIATERAL LUMBAR TWO- LUMBAR THREE, LUMBAR THREE- LUMBAR FOUR, LUMBAR FOUR-FIVE LAMINOTOMY/FORAMINOTOMY 3 LEVELS;  Surgeon: Jenkins, Jeffrey, MD;  Location: [facility];  Service: Neurosurgery;  Laterality: Bilateral;  POSTERIOR   SPINE SURGERY N/A    [employer]reesia 02/24/2020    Current Medications: Current Meds  Medication Sig   acetaminophen (TYLENOL) 650 MG CR tablet Take 1,300 mg by mouth every 8 (eight) hours as needed for pain.    amLODipine (NORVASC) 10 MG tablet Take 1 tablet (10 mg total) by mouth daily.   aspirin EC 81 MG tablet Take 81 mg by mouth daily.   benazepril (LOTENSIN) 40 MG tablet Take 1 tablet (40 mg total) by mouth daily.   blood glucose meter kit and supplies Dispense based on patient and insurance preference. Use up to four times daily as directed. (FOR ICD-10 E10.9, E11.9).   buPROPion (WELLBUTRIN XL) 150 MG 24 hr tablet Take 1 tablet (150 mg total) by mouth daily.   Evolocumab (REPATHA SURECLICK) 140 MG/ML SOAJ INJECT 140 MG INTO THE   SKIN EVERY 14 DAY   gabapentin (NEURONTIN) 300 MG capsule TAKE 1 CAPSULE THREE TIMES DAILY   glipiZIDE (GLIPIZIDE XL) 2.5 MG 24 hr tablet Take 1 tablet (2.5 mg total) by mouth daily with breakfast.   magnesium oxide (MAG-OX) 400 MG tablet Take 400 mg by mouth daily.   meclizine (ANTIVERT) 25 MG tablet Take 1 tablet (25 mg total) by mouth 3 (three) times daily as needed for dizziness.   meloxicam (MOBIC) 7.5 MG tablet Take 1 tablet (7.5 mg total) by mouth daily.    metoprolol succinate (TOPROL-XL) 25 MG 24 hr tablet Take 1 tablet (25 mg total) by mouth daily. Take with or immediately following a meal.   multivitamin-iron-minerals-folic acid (CENTRUM) chewable tablet Chew 1 tablet by mouth daily.   pantoprazole (PROTONIX) 40 MG tablet Take 1 tablet (40 mg total) by mouth daily.   spironolactone-hydrochlorothiazide (ALDACTAZIDE) 25-25 MG tablet Take 1 tablet by mouth daily.     Allergies:   Metformin and related and Statins   Social History   Socioeconomic History   Marital status: Widowed    Spouse name: Cherly    Number of children: 1   Years of education: Not on file   Highest education level: 12th grade  Occupational History    Comment: Retired   Tobacco Use   Smoking status: Never   Smokeless tobacco: Never  Vaping Use   Vaping Use: Never used  Substance and Sexual Activity   Alcohol use: No   Drug use: No   Sexual activity: Not Currently  Other Topics Concern   Not on file  Social History Narrative   Live alone right now, wife passed in 2009    [place] daughter-grandchild 2 live in [place]      Enjoys playing golf      Diet: eats what he likes, sweet, fast food, does not eat a lot of canned or boxed foods.    Does not eat enough fruits and veggies.       Caffeine: 1 cup daily   Water: 3-4 bottles of water daily, Gatorade every now and then         Wears seat belt   Wears sun protection when golfing   Smoke detectors at home   Does not use phone while driving   Social Determinants of Health   Financial Resource Strain: Low Risk    Difficulty of Paying Living Expenses: Not hard at all  Food Insecurity: No Food Insecurity   Worried About Charity fundraiser in the Last Year: Never true   Arboriculturist in the Last Year: Never true  Transportation Needs: No Transportation Needs   Lack of Transportation (Medical): No   Lack of Transportation (Non-Medical): No  Physical Activity: Inactive   Days of Exercise per Week: 0  days   Minutes of Exercise per Session: 0 min  Stress: No Stress Concern Present   Feeling of Stress : Not at all  Social Connections: Moderately Isolated   Frequency of Communication with Friends and Family: More than three times a week   Frequency of Social Gatherings with Friends and Family: More than three times a week   Attends Religious Services: More than 4 times per year   Active Member of [employer] or Organizations: No   Attends Archivist Meetings: Never   Marital Status: Widowed     Family History: The patient's family history includes Asthma in his mother; Hypertension in his father. There is no  history of Colon cancer.  ROS:   Please see the history of present illness.    All other systems reviewed and are negative.  EKGs/Labs/Other Studies Reviewed:    The following studies were reviewed today:  February 06, 2021 ZIO monitor personally reviewed 18.4% PVC burden with bigeminy and trigeminy.  Some nonsustained ventricular tachycardia episodes, longest lasting 7 beats.    June 11, 2020 CTA coronary Aorta: Normal size. Ascending aorta 3.5 cm. Mild calcification of the aortic root and descending aorta. No dissection.  Aortic Valve:  Trileaflet.  Mild calcification.  Coronary Arteries:  Normal coronary origin.  Right dominance.  RCA is a dominant artery that gives rise to PDA and PLVB. There is diffuse minimal (<25%) soft plaque.  Left main is a large artery that gives rise to LAD, RI, and LCX arteries.  LAD is a large vessel that has moderate (50-69%) mixed plaque at the ostium, diffuse mild (25-49%) mixed plaque in the proximal to mid vessel, and minimal (<25%) calcified plaque distally. The diagonal vessels are tiny.  RI is a large, branching vessel with minimal (<25%) calcified plaque proximally and at the branching point  LCX is a non-dominant artery that gives rise to three small OM branches. There is mild (25-49%) calcified plaque in the  proximal LCX.  Other findings:  Normal pulmonary vein drainage into the left atrium.  Normal let atrial appendage without a thrombus.  Normal size of the pulmonary artery.  IMPRESSION: 1. Coronary calcium score of 486. This was 77th percentile for age and sex matched control.  2. Normal coronary origin with right dominance.  3. There is moderate (50-69%) plaque in the ostial LAD with mild (25-49%) plaque in the proximal to mid LAD, proximal LCX, and minimal (<25%) plaque in the RI and RCA. CAD-RADS 3.  4.  Will send study for FFRct.  5. Recommend aggressive risk factor modification, including LDL goal <70.   March 05, 2021 EKG Sinus rhythm with frequent monomorphic PVCs.  PVCs have a steeply inferior axis and a precordial transition in V4.  No precordial transition precedes the normally conducted QRS transition.  Suspect origin on the septal aspect of the RVOT versus cusp.      EKG:  The ekg ordered today demonstrates sinus rhythm with frequent PVCs  He had absolutely recent Labs: 05/14/2020: NT-Pro BNP 25 02/25/2021: ALT 20; TSH 1.660 03/05/2021: BUN 13; Creatinine, Ser 0.99; Hemoglobin 13.5; Magnesium 2.2; Platelets 281; Potassium 3.5; Sodium 139  Recent Lipid Panel    Component Value Date/Time   CHOL 126 02/25/2021 0837   TRIG 165 (H) 02/25/2021 0837   HDL 44 02/25/2021 0837   CHOLHDL 2.9 08/01/2020 0852   CHOLHDL 3.3 01/29/2019 0800   VLDL 30 01/29/2019 0800   LDLCALC 54 02/25/2021 0837    Physical Exam:    VS:  BP 126/62   Pulse 64   Ht 5' 6" (1.676 m)   Wt 212 lb (96.2 kg)   BMI 34.22 kg/m     Wt Readings from Last 3 Encounters:  04/03/21 212 lb (96.2 kg)  04/02/21 214 lb 6.4 oz (97.3 kg)  03/05/21 211 lb 10.3 oz (96 kg)     GEN:  Well nourished, well developed in no acute distress HEENT: Normal NECK: No JVD; No carotid bruits LYMPHATICS: No lymphadenopathy CARDIAC: irregular rhythm, no murmurs, rubs, gallops RESPIRATORY:  Clear to auscultation  without rales, wheezing or rhonchi  ABDOMEN: Soft, non-tender, non-distended MUSCULOSKELETAL:  No edema; No deformity  SKIN:   Warm and dry NEUROLOGIC:  Alert and oriented x 3 PSYCHIATRIC:  Normal affect   ASSESSMENT:    1. PVCs (premature ventricular contractions)   2. Chronic combined systolic and diastolic heart failure ([place])   3. Primary hypertension    PLAN:    In order of problems listed above:  #Frequent Symptomatic PVCs Monomorphic. Appear to be arising from the outflow tracts. Suspect septal aspect of RVOT vs LVOT. Discussed treatment options with the patient including antiarrhythmic drug therapy and EP study with possible ablation and he would like to proceed with ablation. I think this is very reasonable. We discussed the procedure in detail including the risks, recovery and chance of success. We discussed the chance that I would not be able to maneuver my catheter to the PVC's origin. We discussed the possibility that the PVC would be originating near a critical structure that would preclude safe ablation.  Therapeutic strategies for PVC/VT including medicine and ablation were discussed in detail with the patient today. Risk, benefits, and alternatives to EP study and radiofrequency ablation were also discussed in detail today. These risks include but are not limited to stroke, bleeding, vascular damage, tamponade, perforation, damage to the heart and other structures, AV block requiring pacemaker, worsening renal function, and death. The patient understands these risk and wishes to proceed.  We will therefore proceed with catheter ablation at the next available time.  #Chronic Combined Systolic and Diastolic HF NYHA II today. Warm and dry. Last EF shows recovered EF of 55% in 04/2020. Will plan to repeat this prior to the procedure. Continue benazepril, metoprolol and spironolactone.  #HTN Controlled today. Continue above regimen.      Total time spent with patient today 65  minutes. This includes reviewing records, evaluating the patient and coordinating care.  Medication Adjustments/Labs and Tests Ordered: Current medicines are reviewed at length with the patient today.  Concerns regarding medicines are outlined above.  Orders Placed This Encounter  Procedures   Basic metabolic panel   CBC with Differential/Platelet   EKG 12-Lead    No orders of the defined types were placed in this encounter.    Signed, Hilton Cork. Quentin Ore, MD, [facility], [facility] 04/03/2021 10:12 PM    Electrophysiology [facility]

## 2021-04-03 NOTE — Progress Notes (Signed)
Electrophysiology Office Note:    Date:  04/03/2021   ID:  Douglas Keller, DOB [birth year], MRN [id number]  PCP:  Noreene Larsson, NP  [facility] Cardiologist:  Sanda Klein, MD  [facility] Electrophysiologist:  Vickie Epley, MD   Referring MD: Sanda Klein, MD   Chief Complaint: PVC  History of Present Illness:    Douglas Keller is a 80 y.o. male who presents for an evaluation of PVCs at the request of Dr Sallyanne Kuster. Their medical history includes hypertension, hyperlipidemia, CAD and aortic valve sclerosis without stenosis.  He saw Dr. Sallyanne Kuster on February 19, 2021 for his PVCs.  At that appointment they reviewed a recent event monitor from July 2022 which showed 15.7% burden of PVCs.  These PVCs are symptomatic due to the nonperfused nature of the PVCs.  He had a presentation to the emergency department on March 05, 2021 for palpitations that resolved after he arrived to the emergency department.  He saw Dr. Sallyanne Kuster again on April 02, 2021.  This followed a repeat event monitor which showed a high PVC burden and nonsustained/bigeminal/trigeminal PVCs.     Past Medical History:  Diagnosis Date   Aortic valve sclerosis    Atherosclerosis of coronary artery without angina pectoris 07/05/2014   Cervical spondylosis without myelopathy 02/13/2013   Chest pain 01/29/2019   Degeneration of cervical intervertebral disc 02/13/2013   Depression    sometimes   Diabetes mellitus type 2, diet-controlled (HCC)    Elevated troponin    GERD (gastroesophageal reflux disease)    Heart murmur    HTN (hypertension) 07/06/2013   Hypercholesterolemia 08/21/2015   Hyperlipidemia 07/06/2013   Medication management 08/21/2015   Pre-diabetes    Rectal bleeding 09/21/2019   Stiffness of left shoulder joint 07/19/2013    Past Surgical History:  Procedure Laterality Date   BIOPSY  12/14/2019   Procedure: BIOPSY;  Surgeon: Daneil Dolin, MD;  [facility];  Service: Endoscopy;;    CERVICAL SPINE SURGERY     COLONOSCOPY  07/26/2012   Procedure: COLONOSCOPY;  Surgeon: Jamesetta So, MD;  [facility]: [facility];  Service: Gastroenterology;  Laterality: N/A;   COLONOSCOPY WITH PROPOFOL N/A 12/14/2019   Procedure: COLONOSCOPY WITH PROPOFOL;  Surgeon: Daneil Dolin, MD;  Location: [facility];  Service: Endoscopy;  Laterality: N/A;  9:00am   KNEE ARTHROSCOPY Left    torn ligament   LUMBAR LAMINECTOMY/DECOMPRESSION MICRODISCECTOMY Bilateral 12/27/2019   Procedure: BIATERAL LUMBAR TWO- LUMBAR THREE, LUMBAR THREE- LUMBAR FOUR, LUMBAR FOUR-FIVE LAMINOTOMY/FORAMINOTOMY 3 LEVELS;  Surgeon: Newman Pies, MD;  Location: [place];  Service: Neurosurgery;  Laterality: Bilateral;  POSTERIOR   SPINE SURGERY N/A    Phreesia 02/24/2020    Current Medications: Current Meds  Medication Sig   acetaminophen (TYLENOL) 650 MG CR tablet Take 1,300 mg by mouth every 8 (eight) hours as needed for pain.    amLODipine (NORVASC) 10 MG tablet Take 1 tablet (10 mg total) by mouth daily.   aspirin EC 81 MG tablet Take 81 mg by mouth daily.   benazepril (LOTENSIN) 40 MG tablet Take 1 tablet (40 mg total) by mouth daily.   blood glucose meter kit and supplies Dispense based on patient and insurance preference. Use up to four times daily as directed. (FOR ICD-10 E10.9, E11.9).   buPROPion (WELLBUTRIN XL) 150 MG 24 hr tablet Take 1 tablet (150 mg total) by mouth daily.   Evolocumab (REPATHA SURECLICK) 272 MG/ML SOAJ INJECT 140 MG INTO THE  SKIN EVERY 14 DAY   gabapentin (NEURONTIN) 300 MG capsule TAKE 1 CAPSULE THREE TIMES DAILY   glipiZIDE (GLIPIZIDE XL) 2.5 MG 24 hr tablet Take 1 tablet (2.5 mg total) by mouth daily with breakfast.   magnesium oxide (MAG-OX) 400 MG tablet Take 400 mg by mouth daily.   meclizine (ANTIVERT) 25 MG tablet Take 1 tablet (25 mg total) by mouth 3 (three) times daily as needed for dizziness.   meloxicam (MOBIC) 7.5 MG tablet Take 1 tablet (7.5 mg total) by mouth daily.    metoprolol succinate (TOPROL-XL) 25 MG 24 hr tablet Take 1 tablet (25 mg total) by mouth daily. Take with or immediately following a meal.   multivitamin-iron-minerals-folic acid (CENTRUM) chewable tablet Chew 1 tablet by mouth daily.   pantoprazole (PROTONIX) 40 MG tablet Take 1 tablet (40 mg total) by mouth daily.   spironolactone-hydrochlorothiazide (ALDACTAZIDE) 25-25 MG tablet Take 1 tablet by mouth daily.     Allergies:   Metformin and related and Statins   Social History   Socioeconomic History   Marital status: Widowed    Spouse name: Cherly    Number of children: 1   Years of education: Not on file   Highest education level: 12th grade  Occupational History    Comment: Retired   Tobacco Use   Smoking status: Never   Smokeless tobacco: Never  Vaping Use   Vaping Use: Never used  Substance and Sexual Activity   Alcohol use: No   Drug use: No   Sexual activity: Not Currently  Other Topics Concern   Not on file  Social History Narrative   Live alone right now, wife passed in 2009    [place] daughter-grandchild 2 live in [place]      Enjoys playing golf      Diet: eats what he likes, sweet, fast food, does not eat a lot of canned or boxed foods.    Does not eat enough fruits and veggies.       Caffeine: 1 cup daily   Water: 3-4 bottles of water daily, Gatorade every now and then         Wears seat belt   Wears sun protection when golfing   Smoke detectors at home   Does not use phone while driving   Social Determinants of Health   Financial Resource Strain: Low Risk    Difficulty of Paying Living Expenses: Not hard at all  Food Insecurity: No Food Insecurity   Worried About Charity fundraiser in the Last Year: Never true   Arboriculturist in the Last Year: Never true  Transportation Needs: No Transportation Needs   Lack of Transportation (Medical): No   Lack of Transportation (Non-Medical): No  Physical Activity: Inactive   Days of Exercise per Week: 0  days   Minutes of Exercise per Session: 0 min  Stress: No Stress Concern Present   Feeling of Stress : Not at all  Social Connections: Moderately Isolated   Frequency of Communication with Friends and Family: More than three times a week   Frequency of Social Gatherings with Friends and Family: More than three times a week   Attends Religious Services: More than 4 times per year   Active Member of [employer] or Organizations: No   Attends Archivist Meetings: Never   Marital Status: Widowed     Family History: The patient's family history includes Asthma in his mother; Hypertension in his father. There is no  history of Colon cancer.  ROS:   Please see the history of present illness.    All other systems reviewed and are negative.  EKGs/Labs/Other Studies Reviewed:    The following studies were reviewed today:  February 06, 2021 ZIO monitor personally reviewed 18.4% PVC burden with bigeminy and trigeminy.  Some nonsustained ventricular tachycardia episodes, longest lasting 7 beats.    June 11, 2020 CTA coronary Aorta: Normal size. Ascending aorta 3.5 cm. Mild calcification of the aortic root and descending aorta. No dissection.  Aortic Valve:  Trileaflet.  Mild calcification.  Coronary Arteries:  Normal coronary origin.  Right dominance.  RCA is a dominant artery that gives rise to PDA and PLVB. There is diffuse minimal (<25%) soft plaque.  Left main is a large artery that gives rise to LAD, RI, and LCX arteries.  LAD is a large vessel that has moderate (50-69%) mixed plaque at the ostium, diffuse mild (25-49%) mixed plaque in the proximal to mid vessel, and minimal (<25%) calcified plaque distally. The diagonal vessels are tiny.  RI is a large, branching vessel with minimal (<25%) calcified plaque proximally and at the branching point  LCX is a non-dominant artery that gives rise to three small OM branches. There is mild (25-49%) calcified plaque in the  proximal LCX.  Other findings:  Normal pulmonary vein drainage into the left atrium.  Normal let atrial appendage without a thrombus.  Normal size of the pulmonary artery.  IMPRESSION: 1. Coronary calcium score of 486. This was 77th percentile for age and sex matched control.  2. Normal coronary origin with right dominance.  3. There is moderate (50-69%) plaque in the ostial LAD with mild (25-49%) plaque in the proximal to mid LAD, proximal LCX, and minimal (<25%) plaque in the RI and RCA. CAD-RADS 3.  4.  Will send study for FFRct.  5. Recommend aggressive risk factor modification, including LDL goal <70.   March 05, 2021 EKG Sinus rhythm with frequent monomorphic PVCs.  PVCs have a steeply inferior axis and a precordial transition in V4.  No precordial transition precedes the normally conducted QRS transition.  Suspect origin on the septal aspect of the RVOT versus cusp.      EKG:  The ekg ordered today demonstrates sinus rhythm with frequent PVCs  He had absolutely recent Labs: 05/14/2020: NT-Pro BNP 25 02/25/2021: ALT 20; TSH 1.660 03/05/2021: BUN 13; Creatinine, Ser 0.99; Hemoglobin 13.5; Magnesium 2.2; Platelets 281; Potassium 3.5; Sodium 139  Recent Lipid Panel    Component Value Date/Time   CHOL 126 02/25/2021 0837   TRIG 165 (H) 02/25/2021 0837   HDL 44 02/25/2021 0837   CHOLHDL 2.9 08/01/2020 0852   CHOLHDL 3.3 01/29/2019 0800   VLDL 30 01/29/2019 0800   LDLCALC 54 02/25/2021 0837    Physical Exam:    VS:  BP 126/62   Pulse 64   Ht _0  (1.676 m)   Wt 212 lb (96.2 kg)   BMI 34.22 kg/m     Wt Readings from Last 3 Encounters:  04/03/21 212 lb (96.2 kg)  04/02/21 214 lb 6.4 oz (97.3 kg)  03/05/21 211 lb 10.3 oz (96 kg)     GEN:  Well nourished, well developed in no acute distress HEENT: Normal NECK: No JVD; No carotid bruits LYMPHATICS: No lymphadenopathy CARDIAC: irregular rhythm, no murmurs, rubs, gallops RESPIRATORY:  Clear to auscultation  without rales, wheezing or rhonchi  ABDOMEN: Soft, non-tender, non-distended MUSCULOSKELETAL:  No edema; No deformity  SKIN:  Warm and dry NEUROLOGIC:  Alert and oriented x 3 PSYCHIATRIC:  Normal affect   ASSESSMENT:    1. PVCs (premature ventricular contractions)   2. Chronic combined systolic and diastolic heart failure ([place])   3. Primary hypertension    PLAN:    In order of problems listed above:  #Frequent Symptomatic PVCs Monomorphic. Appear to be arising from the outflow tracts. Suspect septal aspect of RVOT vs LVOT. Discussed treatment options with the patient including antiarrhythmic drug therapy and EP study with possible ablation and he would like to proceed with ablation. I think this is very reasonable. We discussed the procedure in detail including the risks, recovery and chance of success. We discussed the chance that I would not be able to maneuver my catheter to the PVC's origin. We discussed the possibility that the PVC would be originating near a critical structure that would preclude safe ablation.  Therapeutic strategies for PVC/VT including medicine and ablation were discussed in detail with the patient today. Risk, benefits, and alternatives to EP study and radiofrequency ablation were also discussed in detail today. These risks include but are not limited to stroke, bleeding, vascular damage, tamponade, perforation, damage to the heart and other structures, AV block requiring pacemaker, worsening renal function, and death. The patient understands these risk and wishes to proceed.  We will therefore proceed with catheter ablation at the next available time.  #Chronic Combined Systolic and Diastolic HF NYHA II today. Warm and dry. Last EF shows recovered EF of 55% in 04/2020. Will plan to repeat this prior to the procedure. Continue benazepril, metoprolol and spironolactone.  #HTN Controlled today. Continue above regimen.      Total time spent with patient today 65  minutes. This includes reviewing records, evaluating the patient and coordinating care.  Medication Adjustments/Labs and Tests Ordered: Current medicines are reviewed at length with the patient today.  Concerns regarding medicines are outlined above.  Orders Placed This Encounter  Procedures   Basic metabolic panel   CBC with Differential/Platelet   EKG 12-Lead    No orders of the defined types were placed in this encounter.    Signed, Hilton Cork. Quentin Ore, MD, [facility], [facility] 04/03/2021 10:12 PM    Electrophysiology [facility]

## 2021-04-04 LAB — BASIC METABOLIC PANEL
BUN/Creatinine Ratio: 11 (ref 10–24)
BUN: 12 mg/dL (ref 8–27)
CO2: 23 mmol/L (ref 20–29)
Calcium: 9.1 mg/dL (ref 8.6–10.2)
Chloride: 108 mmol/L — ABNORMAL HIGH (ref 96–106)
Creatinine, Ser: 1.14 mg/dL (ref 0.76–1.27)
Glucose: 135 mg/dL — ABNORMAL HIGH (ref 65–99)
Potassium: 4.1 mmol/L (ref 3.5–5.2)
Sodium: 148 mmol/L — ABNORMAL HIGH (ref 134–144)
eGFR: 65 mL/min/{1.73_m2} (ref >59)

## 2021-04-04 LAB — CBC WITH DIFFERENTIAL/PLATELET
Basophils Absolute: 0.1 10*3/uL (ref 0.0–0.2)
Basos: 1 %
EOS (ABSOLUTE): 0.2 10*3/uL (ref 0.0–0.4)
Eos: 2 %
Hematocrit: 41.5 % (ref 37.5–51.0)
Hemoglobin: 14.4 g/dL (ref 13.0–17.7)
Immature Grans (Abs): 0 10*3/uL (ref 0.0–0.1)
Immature Granulocytes: 0 %
Lymphocytes Absolute: 1.9 10*3/uL (ref 0.7–3.1)
Lymphs: 25 %
MCH: 30.7 pg (ref 26.6–33.0)
MCHC: 34.7 g/dL (ref 31.5–35.7)
MCV: 89 fL (ref 79–97)
Monocytes Absolute: 0.8 10*3/uL (ref 0.1–0.9)
Monocytes: 10 %
Neutrophils Absolute: 4.8 10*3/uL (ref 1.4–7.0)
Neutrophils: 62 %
Platelets: 302 10*3/uL (ref 150–450)
RBC: 4.69 x10E6/uL (ref 4.14–5.80)
RDW: 12.5 % (ref 11.6–15.4)
WBC: 7.8 10*3/uL (ref 3.4–10.8)

## 2021-04-08 ENCOUNTER — Ambulatory Visit (HOSPITAL_COMMUNITY): Payer: Medicare HMO | Attending: Cardiology

## 2021-04-08 ENCOUNTER — Other Ambulatory Visit: Payer: Self-pay

## 2021-04-08 DIAGNOSIS — I10 Essential (primary) hypertension: Secondary | ICD-10-CM | POA: Diagnosis not present

## 2021-04-08 DIAGNOSIS — E785 Hyperlipidemia, unspecified: Secondary | ICD-10-CM | POA: Diagnosis not present

## 2021-04-08 DIAGNOSIS — Z01818 Encounter for other preprocedural examination: Secondary | ICD-10-CM | POA: Diagnosis not present

## 2021-04-08 DIAGNOSIS — Z01812 Encounter for preprocedural laboratory examination: Secondary | ICD-10-CM

## 2021-04-08 DIAGNOSIS — I493 Ventricular premature depolarization: Secondary | ICD-10-CM | POA: Diagnosis not present

## 2021-04-08 DIAGNOSIS — E119 Type 2 diabetes mellitus without complications: Secondary | ICD-10-CM | POA: Diagnosis not present

## 2021-04-08 LAB — ECHOCARDIOGRAM COMPLETE
Area-P 1/2: 4.74 cm2
P 1/2 time: 812 msec
S' Lateral: 3.6 cm

## 2021-04-14 ENCOUNTER — Telehealth: Payer: Self-pay | Admitting: Cardiovascular Disease

## 2021-04-14 NOTE — Telephone Encounter (Signed)
Call placed to the patient. He was calling to see if he needs to hold his aspirin before the ablation or just the day.   You are scheduled for a(n) PVC Ablation  on  04/18/2021   at   1030am   with Dr. Quentin Ore.   1.   Please come to the [place] at [facility] at 830am on the day of your procedure. Enter through the [employer], [employer] [employer] [address], near [address] parking.   Come prepared to stay overnight.  Please bring your insurance cards and a list of your medications.   3.  Labs today   Do not have anything to eat or drink after midnight the night before your procedure.   5.  Do NOT take any medications the morning of you procedure.       ** Begin holding your Metoprolol 5 days prior to your procedure   6.  Educational material received: PVC Ablation   * Occasionally, EP Studies and ablations can become lengthy.  Please make your family aware of this before your procedure starts.  Average time ranges from 2-8 hours for EP studies/ablations.  Your physician will locate your family after the procedure with the results.

## 2021-04-14 NOTE — Telephone Encounter (Signed)
New Message:     Patient says he is having a procedure on Friday. He wants to know what to do about his Aspirin?

## 2021-04-17 NOTE — Pre-Procedure Instructions (Signed)
Attempted to call patient regarding procedure instructions.  Left voicemail on the following items: Arrival time 0830 Nothing to eat or drink after midnight No meds AM of procedure Responsible person to drive you home and stay with you for 24 hrs  Should be holding Metoprolol  for 5 days

## 2021-04-18 ENCOUNTER — Other Ambulatory Visit: Payer: Self-pay

## 2021-04-18 ENCOUNTER — Ambulatory Visit (HOSPITAL_COMMUNITY): Payer: Medicare HMO | Admitting: Certified Registered Nurse Anesthetist

## 2021-04-18 ENCOUNTER — Encounter (HOSPITAL_COMMUNITY): Payer: Self-pay | Admitting: Cardiology

## 2021-04-18 ENCOUNTER — Encounter (HOSPITAL_COMMUNITY)
Admission: RE | Disposition: A | Discharge status: Home / Self Care | Payer: Self-pay | Source: Home / Self Care | Attending: Cardiology

## 2021-04-18 ENCOUNTER — Ambulatory Visit (HOSPITAL_COMMUNITY)
Admission: RE | Admit: 2021-04-18 | Discharge: 2021-04-18 | Disposition: A | Discharge status: Home / Self Care | Payer: Medicare HMO | Attending: Cardiology | Admitting: Cardiology

## 2021-04-18 DIAGNOSIS — Z79899 Other long term (current) drug therapy: Secondary | ICD-10-CM | POA: Diagnosis not present

## 2021-04-18 DIAGNOSIS — Z888 Allergy status to other drugs, medicaments and biological substances status: Secondary | ICD-10-CM | POA: Diagnosis not present

## 2021-04-18 DIAGNOSIS — I2510 Atherosclerotic heart disease of native coronary artery without angina pectoris: Secondary | ICD-10-CM | POA: Insufficient documentation

## 2021-04-18 DIAGNOSIS — I5042 Chronic combined systolic (congestive) and diastolic (congestive) heart failure: Secondary | ICD-10-CM | POA: Insufficient documentation

## 2021-04-18 DIAGNOSIS — Z7984 Long term (current) use of oral hypoglycemic drugs: Secondary | ICD-10-CM | POA: Diagnosis not present

## 2021-04-18 DIAGNOSIS — Z7982 Long term (current) use of aspirin: Secondary | ICD-10-CM | POA: Diagnosis not present

## 2021-04-18 DIAGNOSIS — E785 Hyperlipidemia, unspecified: Secondary | ICD-10-CM | POA: Diagnosis not present

## 2021-04-18 DIAGNOSIS — K219 Gastro-esophageal reflux disease without esophagitis: Secondary | ICD-10-CM | POA: Diagnosis not present

## 2021-04-18 DIAGNOSIS — I110 Hypertensive heart disease with heart failure: Secondary | ICD-10-CM | POA: Insufficient documentation

## 2021-04-18 DIAGNOSIS — I10 Essential (primary) hypertension: Secondary | ICD-10-CM | POA: Diagnosis not present

## 2021-04-18 DIAGNOSIS — I493 Ventricular premature depolarization: Secondary | ICD-10-CM | POA: Insufficient documentation

## 2021-04-18 HISTORY — PX: PVC ABLATION: EP1236

## 2021-04-18 LAB — POCT ACTIVATED CLOTTING TIME: Activated Clotting Time: 289 seconds

## 2021-04-18 LAB — GLUCOSE, CAPILLARY: Glucose-Capillary: 108 mg/dL — ABNORMAL HIGH (ref 70–99)

## 2021-04-18 SURGERY — PVC ABLATION
Anesthesia: Monitor Anesthesia Care

## 2021-04-18 MED ORDER — HEPARIN (PORCINE) IN NACL 1000-0.9 UT/500ML-% IV SOLN
INTRAVENOUS | Status: AC
  Filled 2021-04-18: qty 500

## 2021-04-18 MED ORDER — SODIUM CHLORIDE 0.9 % IV SOLN: 250.0000 mL | INTRAVENOUS | Status: DC | PRN

## 2021-04-18 MED ORDER — SODIUM CHLORIDE 0.9 % IV SOLN: INTRAVENOUS | Status: DC

## 2021-04-18 MED ORDER — FENTANYL CITRATE (PF) 100 MCG/2ML IJ SOLN: 25.0000 ug | INTRAMUSCULAR | Status: DC | PRN

## 2021-04-18 MED ORDER — PROTAMINE SULFATE 10 MG/ML IV SOLN
INTRAVENOUS | Status: DC | PRN
  Administered 2021-04-18: 30 mg via INTRAVENOUS

## 2021-04-18 MED ORDER — AMISULPRIDE (ANTIEMETIC) 5 MG/2ML IV SOLN
10.0000 mg | Freq: Once | INTRAVENOUS | Status: DC | PRN
  Filled 2021-04-18: qty 4

## 2021-04-18 MED ORDER — ONDANSETRON HCL 4 MG/2ML IJ SOLN: 4.0000 mg | Freq: Four times a day (QID) | INTRAMUSCULAR | Status: DC | PRN

## 2021-04-18 MED ORDER — ISOPROTERENOL HCL 0.2 MG/ML IJ SOLN
INTRAVENOUS | Status: DC | PRN
  Administered 2021-04-18: 2 ug/min via INTRAVENOUS

## 2021-04-18 MED ORDER — PROPOFOL 10 MG/ML IV BOLUS
INTRAVENOUS | Status: DC | PRN
  Administered 2021-04-18: 20 mg via INTRAVENOUS

## 2021-04-18 MED ORDER — ACETAMINOPHEN 325 MG PO TABS: 650.0000 mg | ORAL_TABLET | ORAL | Status: DC | PRN

## 2021-04-18 MED ORDER — FENTANYL CITRATE (PF) 100 MCG/2ML IJ SOLN
INTRAMUSCULAR | Status: DC | PRN
  Administered 2021-04-18 (×2): 25 ug via INTRAVENOUS

## 2021-04-18 MED ORDER — BUPIVACAINE HCL (PF) 0.25 % IJ SOLN
INTRAMUSCULAR | Status: AC
  Filled 2021-04-18: qty 30

## 2021-04-18 MED ORDER — SODIUM CHLORIDE 0.9% FLUSH: 3.0000 mL | INTRAVENOUS | Status: DC | PRN

## 2021-04-18 MED ORDER — HEPARIN SODIUM (PORCINE) 1000 UNIT/ML IJ SOLN
INTRAMUSCULAR | Status: DC | PRN
Start: 2021-04-18 — End: 2021-04-18
  Administered 2021-04-18: 14000 [IU] via INTRAVENOUS

## 2021-04-18 MED ORDER — PROPOFOL 500 MG/50ML IV EMUL
INTRAVENOUS | Status: DC | PRN
  Administered 2021-04-18: 30 ug/kg/min via INTRAVENOUS

## 2021-04-18 MED ORDER — SODIUM CHLORIDE 0.9% FLUSH: 3.0000 mL | Freq: Two times a day (BID) | INTRAVENOUS | Status: DC

## 2021-04-18 MED ORDER — ONDANSETRON HCL 4 MG/2ML IJ SOLN
INTRAMUSCULAR | Status: DC | PRN
  Administered 2021-04-18: 4 mg via INTRAVENOUS

## 2021-04-18 MED ORDER — ISOPROTERENOL HCL 0.2 MG/ML IJ SOLN
INTRAMUSCULAR | Status: AC
  Filled 2021-04-18: qty 5

## 2021-04-18 MED ORDER — MIDAZOLAM HCL 2 MG/2ML IJ SOLN
INTRAMUSCULAR | Status: DC | PRN
  Administered 2021-04-18 (×4): .5 mg via INTRAVENOUS

## 2021-04-18 MED ORDER — BUPIVACAINE HCL (PF) 0.25 % IJ SOLN
INTRAMUSCULAR | Status: DC | PRN
  Administered 2021-04-18: 20 mL

## 2021-04-18 MED ORDER — HEPARIN (PORCINE) IN NACL 1000-0.9 UT/500ML-% IV SOLN
INTRAVENOUS | Status: DC | PRN
  Administered 2021-04-18 (×3): 500 mL

## 2021-04-18 MED ORDER — LIDOCAINE HCL (PF) 2 % IJ SOLN
INTRAMUSCULAR | Status: DC | PRN
  Administered 2021-04-18: 20 mg via INTRADERMAL

## 2021-04-18 SURGICAL SUPPLY — 11 items
CATH JOSEPH QUAD ALLRED 6F REP (CATHETERS) ×2 IMPLANT
CATH OCTARAY 2.0 F 3-3-3-3-3 (CATHETERS) ×2 IMPLANT
CATH SOUNDSTAR ECO 8FR (CATHETERS) ×2 IMPLANT
CLOSURE PERCLOSE PROSTYLE (VASCULAR PRODUCTS) ×6 IMPLANT
PACK EP LATEX FREE (CUSTOM PROCEDURE TRAY) ×2
PACK EP LF (CUSTOM PROCEDURE TRAY) ×1 IMPLANT
PAD PRO RADIOLUCENT 2001M-C (PAD) ×2 IMPLANT
PATCH CARTO3 (PAD) ×2 IMPLANT
SHEATH PINNACLE 8F 10CM (SHEATH) ×4 IMPLANT
SHEATH PINNACLE 9F 10CM (SHEATH) ×2 IMPLANT
SHEATH PROBE COVER 6X72 (BAG) ×2 IMPLANT

## 2021-04-18 NOTE — Transfer of Care (Signed)
Immediate Anesthesia Transfer of Care Note  Patient: Douglas Keller  Procedure(s) Performed: PVC ABLATION  Patient Location: PACU and Cath Lab  Anesthesia Type:MAC  Level of Consciousness: awake, alert  and patient cooperative  Airway & Oxygen Therapy: Patient Spontanous Breathing  Post-op Assessment: Report given to RN and Post -op Vital signs reviewed and stable  Post vital signs: Reviewed and stable  Last Vitals:  Vitals Value Taken Time  BP 173/84 04/18/21 1223  Temp    Pulse 68 04/18/21 1224  Resp 16 04/18/21 1224  SpO2 100 % 04/18/21 1224  Vitals shown include unvalidated device data.  Last Pain:  Vitals:   04/18/21 0843  TempSrc: Oral  PainSc: 0-No pain      Patients Stated Pain Goal: 3 (29/56/21 3086)  Complications: No notable events documented.

## 2021-04-18 NOTE — Interval H&P Note (Signed)
History and Physical Interval Note:  04/18/2021 10:14 AM  Douglas Keller  has presented today for surgery, with the diagnosis of pvc.  The various methods of treatment have been discussed with the patient and family. After consideration of risks, benefits and other options for treatment, the patient has consented to  Procedure(s): PVC ABLATION (N/A) as a surgical intervention.  The patient's history has been reviewed, patient examined, no change in status, stable for surgery.  I have reviewed the patient's chart and labs.  Questions were answered to the patient's satisfaction.     Douglas Keller T Farmer Mccahill

## 2021-04-18 NOTE — Anesthesia Postprocedure Evaluation (Addendum)
Anesthesia Post Note  Patient: Douglas Keller  Procedure(s) Performed: PVC ABLATION     Patient location during evaluation: PACU Anesthesia Type: MAC Level of consciousness: awake and alert Pain management: pain level controlled Vital Signs Assessment: post-procedure vital signs reviewed and stable Respiratory status: spontaneous breathing, nonlabored ventilation, respiratory function stable and patient connected to nasal cannula oxygen Cardiovascular status: blood pressure returned to baseline and stable Postop Assessment: no apparent nausea or vomiting Anesthetic complications: no   No notable events documented.  Last Vitals:  Vitals:   04/18/21 1347 04/18/21 1356  BP: (!) 142/75 (!) 154/81  Pulse:  65  Resp: 19 18  Temp:    SpO2:  100%    Last Pain:  Vitals:   04/18/21 1319  TempSrc:   PainSc: 0-No pain                 Tiajuana Amass

## 2021-04-18 NOTE — Anesthesia Procedure Notes (Signed)
Procedure Name: MAC Date/Time: 04/18/2021 10:50 AM Performed by: Janace Litten, CRNA Pre-anesthesia Checklist: Patient identified, Emergency Drugs available, Suction available and Patient being monitored Oxygen Delivery Method: Simple face mask

## 2021-04-18 NOTE — Anesthesia Preprocedure Evaluation (Addendum)
Anesthesia Evaluation  Patient identified by MRN, date of birth, ID band Patient awake    Reviewed: Allergy & Precautions, NPO status , Patient's Chart, lab work & pertinent test results  Airway Mallampati: II  TM Distance: >3 FB Neck ROM: Full    Dental  (+) Dental Advisory Given   Pulmonary neg pulmonary ROS,    breath sounds clear to auscultation       Cardiovascular hypertension, Pt. on medications and Pt. on home beta blockers + dysrhythmias  Rhythm:Regular Rate:Normal     Neuro/Psych negative neurological ROS     GI/Hepatic Neg liver ROS, GERD  ,  Endo/Other  diabetes  Renal/GU negative Renal ROS     Musculoskeletal  (+) Arthritis ,   Abdominal   Peds  Hematology negative hematology ROS (+)   Anesthesia Other Findings   Reproductive/Obstetrics                            Anesthesia Physical Anesthesia Plan  ASA: 3  Anesthesia Plan: MAC   Post-op Pain Management:    Induction:   PONV Risk Score and Plan: Ondansetron, Treatment may vary due to age or medical condition and Propofol infusion  Airway Management Planned: Natural Airway and Simple Face Mask  Additional Equipment: None  Intra-op Plan:   Post-operative Plan:   Informed Consent: I have reviewed the patients History and Physical, chart, labs and discussed the procedure including the risks, benefits and alternatives for the proposed anesthesia with the patient or authorized representative who has indicated his/her understanding and acceptance.       Plan Discussed with: CRNA  Anesthesia Plan Comments:        Anesthesia Quick Evaluation

## 2021-04-18 NOTE — Discharge Instructions (Addendum)
Post procedure care instructions No driving for 4 days. No lifting over 5 lbs for 1 week. No vigorous or sexual activity for 1 week. You may return to work/your usual activities on 04/26/21. Keep procedure site clean & dry. If you notice increased pain, swelling, bleeding or pus, call/return!  You may shower after 24 hours, but no soaking in baths/hot tubs/pools for 1 week.  Cardiac Ablation, Care After  This sheet gives you information about how to care for yourself after your procedure. Your health care provider may also give you more specific instructions. If you have problems or questions, contact your health care provider. What can I expect after the procedure? After the procedure, it is common to have: Bruising around your puncture site. Tenderness around your puncture site. Skipped heartbeats. Tiredness (fatigue).  Follow these instructions at home: Puncture site care  Follow instructions from your health care provider about how to take care of your puncture site. Make sure you: If present, leave stitches (sutures), skin glue, or adhesive strips in place. These skin closures may need to stay in place for up to 2 weeks. If adhesive strip edges start to loosen and curl up, you may trim the loose edges. Do not remove adhesive strips completely unless your health care provider tells you to do that. If a large square bandage is present, this may be removed 24 hours after surgery.  Check your puncture site every day for signs of infection. Check for: Redness, swelling, or pain. Fluid or blood. If your puncture site starts to bleed, lie down on your back, apply firm pressure to the area, and contact your health care provider. Warmth. Pus or a bad smell. Driving Do not drive for at least 4 days after your procedure or however long your health care provider recommends. (Do not resume driving if you have previously been instructed not to drive for other health reasons.) Do not drive or use heavy  machinery while taking prescription pain medicine. Activity Avoid activities that take a lot of effort for at least 7 days after your procedure. Do not lift anything that is heavier than 5 lb (4.5 kg) for one week.  No sexual activity for 1 week.  Return to your normal activities as told by your health care provider. Ask your health care provider what activities are safe for you. General instructions Take over-the-counter and prescription medicines only as told by your health care provider. Do not use any products that contain nicotine or tobacco, such as cigarettes and e-cigarettes. If you need help quitting, ask your health care provider. You may shower after 24 hours, but Do not take baths, swim, or use a hot tub for 1 week.  Do not drink alcohol for 24 hours after your procedure. Keep all follow-up visits as told by your health care provider. This is important. Contact a health care provider if: You have redness, mild swelling, or pain around your puncture site. You have fluid or blood coming from your puncture site that stops after applying firm pressure to the area. Your puncture site feels warm to the touch. You have pus or a bad smell coming from your puncture site. You have a fever. You have chest pain or discomfort that spreads to your neck, jaw, or arm. You are sweating a lot. You feel nauseous. You have a fast or irregular heartbeat. You have shortness of breath. You are dizzy or light-headed and feel the need to lie down. You have pain or numbness in the  arm or leg closest to your puncture site. Get help right away if: Your puncture site suddenly swells. Your puncture site is bleeding and the bleeding does not stop after applying firm pressure to the area. These symptoms may represent a serious problem that is an emergency. Do not wait to see if the symptoms will go away. Get medical help right away. Call your local emergency services (911 in the [place] Do not drive yourself  to the hospital. Summary After the procedure, it is normal to have bruising and tenderness at the puncture site in your groin, neck, or forearm. Check your puncture site every day for signs of infection. Get help right away if your puncture site is bleeding and the bleeding does not stop after applying firm pressure to the area. This is a medical emergency. This information is not intended to replace advice given to you by your health care provider. Make sure you discuss any questions you have with your health care provider.

## 2021-04-21 ENCOUNTER — Encounter (HOSPITAL_COMMUNITY): Payer: Self-pay | Admitting: Cardiology

## 2021-04-21 NOTE — Addendum Note (Signed)
Addendum  created 04/21/21 1554 by Suzette Battiest, MD   Clinical Note Signed, SmartForm saved

## 2021-05-21 NOTE — Progress Notes (Signed)
Electrophysiology Office Follow up Visit Note:    Date:  05/22/2021   ID:  Douglas Keller, DOB [birth year], MRN [id number]  PCP:  Noreene Larsson, NP  [facility] Cardiologist:  Sanda Klein, MD  [facility] Electrophysiologist:  Vickie Epley, MD    Interval History:    Douglas Keller is a 80 y.o. male who presents for a follow up visit after an EP study for a frequent PVC 04/18/2021. During the procedure the origin of the PVC was mapped to adjacent to the his bundle and thus was not targeted with RF.  He presents today for follow up.   Since the procedure he is done about the same.  He still feels fatigued with exertion.  No syncope or presyncope.     Past Medical History:  Diagnosis Date   Aortic valve sclerosis    Atherosclerosis of coronary artery without angina pectoris 07/05/2014   Cervical spondylosis without myelopathy 02/13/2013   Chest pain 01/29/2019   Degeneration of cervical intervertebral disc 02/13/2013   Depression    sometimes   Diabetes mellitus type 2, diet-controlled (HCC)    Elevated troponin    GERD (gastroesophageal reflux disease)    Heart murmur    HTN (hypertension) 07/06/2013   Hypercholesterolemia 08/21/2015   Hyperlipidemia 07/06/2013   Medication management 08/21/2015   Pre-diabetes    Rectal bleeding 09/21/2019   Stiffness of left shoulder joint 07/19/2013    Past Surgical History:  Procedure Laterality Date   BIOPSY  12/14/2019   Procedure: BIOPSY;  Surgeon: Daneil Dolin, MD;  [facility];  Service: Endoscopy;;   CERVICAL SPINE SURGERY     COLONOSCOPY  07/26/2012   Procedure: COLONOSCOPY;  Surgeon: Jamesetta So, MD;  [facility];  Service: Gastroenterology;  Laterality: N/A;   COLONOSCOPY WITH PROPOFOL N/A 12/14/2019   Procedure: COLONOSCOPY WITH PROPOFOL;  Surgeon: Daneil Dolin, MD;  [facility];  Service: Endoscopy;  Laterality: N/A;  9:00am   KNEE ARTHROSCOPY Left    torn ligament   LUMBAR  LAMINECTOMY/DECOMPRESSION MICRODISCECTOMY Bilateral 12/27/2019   Procedure: BIATERAL LUMBAR TWO- LUMBAR THREE, LUMBAR THREE- LUMBAR FOUR, LUMBAR FOUR-FIVE LAMINOTOMY/FORAMINOTOMY 3 LEVELS;  Surgeon: Newman Pies, MD;  Location: [place];  Service: Neurosurgery;  Laterality: Bilateral;  POSTERIOR   PVC ABLATION N/A 04/18/2021   Procedure: PVC ABLATION;  Surgeon: Vickie Epley, MD;  Location: [facility];  Service: Cardiovascular;  Laterality: N/A;   SPINE SURGERY N/A    Phreesia 02/24/2020    Current Medications: Current Meds  Medication Sig   acetaminophen (TYLENOL) 650 MG CR tablet Take 1,300 mg by mouth 2 (two) times daily.   amLODipine (NORVASC) 10 MG tablet Take 1 tablet (10 mg total) by mouth daily.   aspirin EC 81 MG tablet Take 81 mg by mouth daily.   benazepril (LOTENSIN) 40 MG tablet Take 1 tablet (40 mg total) by mouth daily.   blood glucose meter kit and supplies Dispense based on patient and insurance preference. Use up to four times daily as directed. (FOR ICD-10 E10.9, E11.9).   buPROPion (WELLBUTRIN XL) 150 MG 24 hr tablet Take 1 tablet (150 mg total) by mouth daily.   Evolocumab (REPATHA SURECLICK) 786 MG/ML SOAJ INJECT 140 MG INTO THE SKIN EVERY 14 DAY   gabapentin (NEURONTIN) 300 MG capsule TAKE 1 CAPSULE THREE TIMES DAILY (Patient taking differently: Take 300 mg by mouth 2 (two) times daily.)   glipiZIDE (GLIPIZIDE XL) 2.5 MG  24 hr tablet Take 1 tablet (2.5 mg total) by mouth daily with breakfast.   magnesium oxide (MAG-OX) 400 MG tablet Take 400 mg by mouth daily.   meclizine (ANTIVERT) 25 MG tablet Take 1 tablet (25 mg total) by mouth 3 (three) times daily as needed for dizziness.   meloxicam (MOBIC) 7.5 MG tablet Take 1 tablet (7.5 mg total) by mouth daily. (Patient taking differently: Take 7.5 mg by mouth daily as needed for pain.)   multivitamin-iron-minerals-folic acid (CENTRUM) chewable tablet Chew 1 tablet by mouth daily.   pantoprazole (PROTONIX) 40 MG  tablet Take 1 tablet (40 mg total) by mouth daily.   spironolactone-hydrochlorothiazide (ALDACTAZIDE) 25-25 MG tablet Take 1 tablet by mouth daily.     Allergies:   Metformin and related and Statins   Social History   Socioeconomic History   Marital status: Widowed    Spouse name: Cherly    Number of children: 1   Years of education: Not on file   Highest education level: 12th grade  Occupational History    Comment: Retired   Tobacco Use   Smoking status: Never   Smokeless tobacco: Never  Vaping Use   Vaping Use: Never used  Substance and Sexual Activity   Alcohol use: No   Drug use: No   Sexual activity: Not Currently  Other Topics Concern   Not on file  Social History Narrative   Live alone right now, wife passed in 2009    [place] daughter-grandchild 2 live in [place]      Enjoys playing golf      Diet: eats what he likes, sweet, fast food, does not eat a lot of canned or boxed foods.    Does not eat enough fruits and veggies.       Caffeine: 1 cup daily   Water: 3-4 bottles of water daily, Gatorade every now and then         Wears seat belt   Wears sun protection when golfing   Smoke detectors at home   Does not use phone while driving   Social Determinants of Health   Financial Resource Strain: Low Risk    Difficulty of Paying Living Expenses: Not hard at all  Food Insecurity: No Food Insecurity   Worried About Charity fundraiser in the Last Year: Never true   Arboriculturist in the Last Year: Never true  Transportation Needs: No Transportation Needs   Lack of Transportation (Medical): No   Lack of Transportation (Non-Medical): No  Physical Activity: Inactive   Days of Exercise per Week: 0 days   Minutes of Exercise per Session: 0 min  Stress: No Stress Concern Present   Feeling of Stress : Not at all  Social Connections: Moderately Isolated   Frequency of Communication with Friends and Family: More than three times a week   Frequency of Social  Gatherings with Friends and Family: More than three times a week   Attends Religious Services: More than 4 times per year   Active Member of [employer] or Organizations: No   Attends Archivist Meetings: Never   Marital Status: Widowed     Family History: The patient's family history includes Asthma in his mother; Hypertension in his father. There is no history of Colon cancer.  ROS:   Please see the history of present illness.    All other systems reviewed and are negative.  EKGs/Labs/Other Studies Reviewed:    The following studies were reviewed  today:  EKG:  The ekg ordered today demonstrates sinus rhythm with frequent monomorphic PVCs.  PVCs have an inferior axis and a precordial transition in V4.  V1 is a QS.  Lead I is biphasic.  Recent Labs: 02/25/2021: ALT 20; TSH 1.660 03/05/2021: Magnesium 2.2 04/03/2021: BUN 12; Creatinine, Ser 1.14; Hemoglobin 14.4; Platelets 302; Potassium 4.1; Sodium 148  Recent Lipid Panel    Component Value Date/Time   CHOL 126 02/25/2021 0837   TRIG 165 (H) 02/25/2021 0837   HDL 44 02/25/2021 0837   CHOLHDL 2.9 08/01/2020 0852   CHOLHDL 3.3 01/29/2019 0800   VLDL 30 01/29/2019 0800   LDLCALC 54 02/25/2021 0837    Physical Exam:    VS:  BP 130/76   Pulse 67   Ht 5' 6" (1.676 m)   Wt 211 lb 9.6 oz (96 kg)   SpO2 91%   BMI 34.15 kg/m     Wt Readings from Last 3 Encounters:  05/22/21 211 lb 9.6 oz (96 kg)  04/18/21 210 lb (95.3 kg)  04/03/21 212 lb (96.2 kg)     GEN:  Well nourished, well developed in no acute distress HEENT: Normal NECK: No JVD; No carotid bruits LYMPHATICS: No lymphadenopathy CARDIAC: Irregular rhythm, no murmurs, rubs, gallops RESPIRATORY:  Clear to auscultation without rales, wheezing or rhonchi  ABDOMEN: Soft, non-tender, non-distended MUSCULOSKELETAL:  No edema; No deformity  SKIN: Warm and dry NEUROLOGIC:  Alert and oriented x 3 PSYCHIATRIC:  Normal affect        ASSESSMENT:    1. PVCs  (premature ventricular contractions)   2. Chronic combined systolic and diastolic heart failure (Zap)   3. Primary hypertension    PLAN:    In order of problems listed above:  #PVCs Symptomatic.  Continues to have a high burden.  During previous EP study I localized the origin to near the His bundle.  I recommended amiodarone 200 mg by mouth twice daily for 7 days followed by 200 mg by mouth once daily.  Recent TSH and CMP are within normal limits.  He will follow-up with an APP in 3 months with repeat blood work at that time.  #Chronic combined systolic and diastolic heart failure NYHA class II today.  Warm and dry on exam.  Continue current medical therapy.  Suppression of PVCs indicated as above.  #Hypertension Controlled Continue current regimen   Follow-up 3 months with APP.     Medication Adjustments/Labs and Tests Ordered: Current medicines are reviewed at length with the patient today.  Concerns regarding medicines are outlined above.  No orders of the defined types were placed in this encounter.  No orders of the defined types were placed in this encounter.    Signed, Lars Mage, MD, [facility], [facility] 05/22/2021 12:26 PM    Electrophysiology [facility]

## 2021-05-22 ENCOUNTER — Ambulatory Visit: Payer: Medicare HMO | Admitting: Cardiology

## 2021-05-22 ENCOUNTER — Encounter: Payer: Self-pay | Admitting: Cardiology

## 2021-05-22 ENCOUNTER — Other Ambulatory Visit: Payer: Self-pay

## 2021-05-22 VITALS — BP 130/76 | HR 67 | Ht 66.0 in | Wt 211.6 lb

## 2021-05-22 DIAGNOSIS — I493 Ventricular premature depolarization: Secondary | ICD-10-CM | POA: Diagnosis not present

## 2021-05-22 DIAGNOSIS — I5042 Chronic combined systolic (congestive) and diastolic (congestive) heart failure: Secondary | ICD-10-CM | POA: Diagnosis not present

## 2021-05-22 DIAGNOSIS — I10 Essential (primary) hypertension: Secondary | ICD-10-CM

## 2021-05-22 MED ORDER — AMIODARONE HCL 200 MG PO TABS: ORAL_TABLET | ORAL | 3 refills | Status: DC

## 2021-05-22 NOTE — Patient Instructions (Addendum)
Medication Instructions:  Your physician has recommended you make the following change in your medication:   Start Amiodarone (200 mg) 1 tablet by mouth twice a day FOR 7 DAYS THEN take Amiodarone (200 mg) by mouth 1 tablet daily.  Lab Work: None ordered. If you have labs (blood work) drawn today and your tests are completely normal, you will receive your results only by: [place] (if you have MyChart) OR A paper copy in the mail If you have any lab test that is abnormal or we need to change your treatment, we will call you to review the results.  Testing/Procedures: None ordered.  Follow-Up: At [facility], you and your health needs are our priority.  As part of our continuing mission to provide you with exceptional heart care, we have created designated Provider Care Teams.  These Care Teams include your primary Cardiologist (physician) and Advanced Practice Providers (APPs -  Physician Assistants and Nurse Practitioners) who all work together to provide you with the care you need, when you need it.  Your next appointment:   Your physician wants you to follow-up in: 3 months one of the following Advanced Practice Providers on your designated Care Team:   Tommye Standard, PA-C Douglas Keller "Jonni Sanger" [place], [place] You will receive a reminder letter in the mail two months in advance. If you don't receive a letter, please call our office to schedule the follow-up appointment.  Amiodarone Tablets What is this medication? AMIODARONE (a MEE oh da rone) prevents and treats a fast or irregular heartbeat (arrhythmia). It works by slowing down overactive electric signals in the heart, which stabilizes your heart rhythm. It belongs to a group of medications called antiarrhythmics. This medicine may be used for other purposes; ask your health care provider or pharmacist if you have questions. COMMON BRAND NAME(S): Cordarone, Pacerone What should I tell my care team before I take this  medication? They need to know if you have any of these conditions: Liver disease Lung disease Other heart problems Thyroid disease An unusual or allergic reaction to amiodarone, iodine, other medications, foods, dyes, or preservatives Pregnant or trying to get pregnant Breast-feeding How should I use this medication? Take this medication by mouth with a glass of water. Follow the directions on the prescription label. You can take this medication with or without food. However, you should always take it the same way each time. Take your doses at regular intervals. Do not take your medication more often than directed. Do not stop taking except on the advice of your care team. A special MedGuide will be given to you by the pharmacist with each prescription and refill. Be sure to read this information carefully each time. Talk to your care team regarding the use of this medication in children. Special care may be needed. Overdosage: If you think you have taken too much of this medicine contact a poison control center or emergency room at once. NOTE: This medicine is only for you. Do not share this medicine with others. What if I miss a dose? If you miss a dose, take it as soon as you can. If it is almost time for your next dose, take only that dose. Do not take double or extra doses. What may interact with this medication? Do not take this medication with any of the following: Abarelix Apomorphine Arsenic trioxide Certain antibiotics like erythromycin, gemifloxacin, levofloxacin, pentamidine Certain medications for depression like amoxapine, tricyclic antidepressants Certain medications for fungal infections like fluconazole, itraconazole, ketoconazole, posaconazole,  voriconazole Certain medications for irregular heartbeat like disopyramide, dronedarone, ibutilide, propafenone, sotalol Certain medications for malaria like chloroquine,  halofantrine Cisapride Droperidol Haloperidol [place] Maprotiline Methadone Phenothiazines like chlorpromazine, mesoridazine, thioridazine Pimozide Ranolazine Red yeast rice Vardenafil This medication may also interact with the following: Antiviral medications for HIV or AIDS Certain medications for blood pressure, heart disease, irregular heart beat Certain medications for cholesterol like atorvastatin, cerivastatin, lovastatin, simvastatin Certain medications for hepatitis C like sofosbuvir and ledipasvir; sofosbuvir Certain medications for seizures like phenytoin Certain medications for thyroid problems Certain medications that treat or prevent blood clots like warfarin Cholestyramine Cimetidine Clopidogrel Cyclosporine Dextromethorphan Diuretics Dofetilide Fentanyl General anesthetics Grapefruit juice Lidocaine Loratadine Methotrexate Other medications that prolong the QT interval (cause an abnormal heart rhythm) Procainamide Quinidine Rifabutin, rifampin, or rifapentine St. John's Wort Trazodone Ziprasidone This list may not describe all possible interactions. Give your health care provider a list of all the medicines, herbs, non-prescription drugs, or dietary supplements you use. Also tell them if you smoke, drink alcohol, or use illegal drugs. Some items may interact with your medicine. What should I watch for while using this medication? Your condition will be monitored closely when you first begin therapy. Often, this medication is first started in a hospital or other monitored health care setting. Once you are on maintenance therapy, visit your care team for regular checks on your progress. Because your condition and use of this medication carry some risk, it is a good idea to carry an identification card, necklace or bracelet with details of your condition, medications, and care team. You may get drowsy or dizzy. Do not drive, use machinery, or do anything that  needs mental alertness until you know how this medication affects you. Do not stand or sit up quickly, especially if you are an older patient. This reduces the risk of dizzy or fainting spells. This medication can make you more sensitive to the sun. Keep out of the sun. If you cannot avoid being in the sun, wear protective clothing and use sunscreen. Do not use sun lamps or tanning beds/booths. You should have regular eye exams before and during treatment. Call your care team if you have blurred vision, see halos, or your eyes become sensitive to light. Your eyes may get dry. It may be helpful to use a lubricating eye solution or artificial tears solution. If you are going to have surgery or a procedure that requires contrast dyes, tell your care team that you are taking this medication. What side effects may I notice from receiving this medication? Side effects that you should report to your care team as soon as possible: Allergic reactions-skin rash, itching, hives, swelling of the face, lips, tongue, or throat Bluish-gray skin Change in vision such as blurry vision, seeing halos around lights, vision loss Heart failure-shortness of breath, swelling of the ankles, feet, or hands, sudden weight gain, unusual weakness or fatigue Heart rhythm changes-fast or irregular heartbeat, dizziness, feeling faint or lightheaded, chest pain, trouble breathing High thyroid levels (hyperthyroidism)-fast or irregular heartbeat, weight loss, excessive sweating or sensitivity to heat, tremors or shaking, anxiety, nervousness, irregular menstrual cycle or spotting Liver injury-right upper belly pain, loss of appetite, nausea, light-colored stool, dark yellow or brown urine, yellowing skin or eyes, unusual weakness or fatigue Low thyroid levels (hypothyroidism)-unusual weakness or fatigue, sensitivity to cold, constipation, hair loss, dry skin, weight gain, feelings of depression Lung injury-shortness of breath or  trouble breathing, cough, spitting up blood, chest pain, fever Pain, tingling, or numbness  in the hands or feet, muscle weakness, trouble walking, loss of balance or coordination Side effects that usually do not require medical attention (report to your care team if they continue or are bothersome): Nausea Vomiting This list may not describe all possible side effects. Call your doctor for medical advice about side effects. You may report side effects to FDA at 1-800-FDA-1088. Where should I keep my medication? Keep out of the reach of children and pets. Store at room temperature between 20 and 25 degrees C (68 and 77 degrees F). Protect from light. Keep container tightly closed. Throw away any unused medication after the expiration date. NOTE: This sheet is a summary. It may not cover all possible information. If you have questions about this medicine, talk to your doctor, pharmacist, or health care provider.  2022 Elsevier/Gold Standard (2020-08-08 11:04:04)

## 2021-05-29 ENCOUNTER — Other Ambulatory Visit: Payer: Self-pay

## 2021-05-29 ENCOUNTER — Telehealth: Payer: Self-pay

## 2021-05-29 DIAGNOSIS — I10 Essential (primary) hypertension: Secondary | ICD-10-CM

## 2021-05-29 DIAGNOSIS — M48062 Spinal stenosis, lumbar region with neurogenic claudication: Secondary | ICD-10-CM

## 2021-05-29 MED ORDER — MECLIZINE HCL 25 MG PO TABS: 25.0000 mg | ORAL_TABLET | Freq: Three times a day (TID) | ORAL | 0 refills | Status: DC | PRN

## 2021-05-29 MED ORDER — AMLODIPINE BESYLATE 10 MG PO TABS: 10.0000 mg | ORAL_TABLET | Freq: Every day | ORAL | 1 refills | Status: DC

## 2021-05-29 MED ORDER — GABAPENTIN 300 MG PO CAPS
ORAL_CAPSULE | ORAL | 0 refills | Status: DC
Start: 2021-05-29 — End: 2021-06-18

## 2021-05-29 NOTE — Telephone Encounter (Signed)
Patient called need med refills, patient request 90 day supply if possible.  amLODipine (NORVASC) 10 MG tablet  gabapentin (NEURONTIN) 300 MG capsule  meclizine (ANTIVERT) 25 MG tablet   Pharmacy  [facility] - [place], [place]  [place], [place] [address]  Phone:  [phone]  Fax:  [phone]

## 2021-06-18 ENCOUNTER — Other Ambulatory Visit: Payer: Self-pay | Admitting: Nurse Practitioner

## 2021-06-18 DIAGNOSIS — M48062 Spinal stenosis, lumbar region with neurogenic claudication: Secondary | ICD-10-CM

## 2021-06-26 ENCOUNTER — Other Ambulatory Visit: Payer: Self-pay | Admitting: Radiology

## 2021-06-26 ENCOUNTER — Other Ambulatory Visit: Payer: Self-pay

## 2021-06-26 ENCOUNTER — Telehealth: Payer: Self-pay

## 2021-06-26 DIAGNOSIS — M170 Bilateral primary osteoarthritis of knee: Secondary | ICD-10-CM

## 2021-06-26 DIAGNOSIS — I10 Essential (primary) hypertension: Secondary | ICD-10-CM

## 2021-06-26 MED ORDER — BENAZEPRIL HCL 40 MG PO TABS: 40.0000 mg | ORAL_TABLET | Freq: Every day | ORAL | 1 refills | Status: DC

## 2021-06-26 MED ORDER — MELOXICAM 7.5 MG PO TABS: 7.5000 mg | ORAL_TABLET | Freq: Every day | ORAL | 2 refills | Status: DC

## 2021-06-26 NOTE — Telephone Encounter (Signed)
Refill sent.

## 2021-06-26 NOTE — Telephone Encounter (Signed)
Patient need med refill Benazepril 40 mg to [place], [address]  [place], [place] [address]  Phone:  [phone]  Fax:  [phone]

## 2021-06-26 NOTE — Telephone Encounter (Signed)
Refill request received via fax for Meloxicam [place]

## 2021-07-03 NOTE — Progress Notes (Signed)
Patient ID: Caedon Bond, male   DOB: 19-Oct-1940, 80 y.o.   MRN: [id number]    Cardiology Office Note    Date:  07/03/2021   ID:  Aniketh Huberty, DOB 1941-02-12, MRN [id number]  PCP:  Noreene Larsson, NP  Cardiologist:   Sanda Klein, MD   No chief complaint on file.   History of Present Illness:  Kevaughn Ewing is a 80 y.o. male with hyperlipidemia, hypertension, moderate coronary atherosclerosis and a murmur due to aortic valve sclerosis without stenosis.  Most recently, his problems have been related to very frequent PVCs that often occur in a pattern of bigeminy causing effective bradycardia in the 30s.  This led to an emergency room visit on July 4.  An event monitor showed that 16% of all QRS complexes are PVCs and included brief episodes of nonsustained VT as well as bigeminy and trigeminy.  We tried doubling his a beta-blocker, without much improvement.  He was referred to electrophysiology and he underwent an EP study on 04/18/2021 with Dr. Quentin Ore.  Unfortunately the focus of his PVCs is very close to the His bundle and ablation could not be performed.  At her follow-up visit on 05/22/2021 he was started on amiodarone.  He also continues to take metoprolol succinate 25 mg once daily.  He is feeling better.  He has more energy.  He started going back to the gym.  He wants to lose some with the weight that he is gaining.  The patient specifically denies any chest pain at rest exertion, dyspnea at rest or with exertion, orthopnea, paroxysmal nocturnal dyspnea, syncope, palpitations, focal neurological deficits, intermittent claudication, lower extremity edema, unexplained weight gain, cough, hemoptysis or wheezing.  He did not tolerate treatment with chlorthalidone due to severe muscle cramps, but is doing well on the spironolactone-hydrochlorothiazide combination.  Treatment with furosemide was discontinued due to worsening renal function.    An event monitor in November 2018 showed  relative bradycardia and a suggestion of chronotropic incompetence.  He did not have any symptoms and a pacemaker was not necessary.  His echocardiogram in 2020 showed borderline LVEF of 45-50% (similar to EF 48% by nuclear stress test in 2016) and impaired relaxation.  Another arrhythmia monitor performed in July 2022 showed fairly frequent PVCs representing 16% of all QRS complexes, often causing effective bradycardia during periods of bigeminy.  He was intolerant of multiple statins even when we tried to supplement coenzyme Q 10.  He is now on Repatha and tolerating it very well, with excellent LDL level.  Cardiac catheterization in 2006 showed  non obstructive atherosclerosis (40% proximal LAD, 40% mid RCA) and had nuclear stress test in 2018 showed normal perfusion, LVEF was only 48% .  Normal ABI bilaterally February 2020  Past Medical History:  Diagnosis Date   Aortic valve sclerosis    Atherosclerosis of coronary artery without angina pectoris 07/05/2014   Cervical spondylosis without myelopathy 02/13/2013   Chest pain 01/29/2019   Degeneration of cervical intervertebral disc 02/13/2013   Depression    sometimes   Diabetes mellitus type 2, diet-controlled (HCC)    Elevated troponin    GERD (gastroesophageal reflux disease)    Heart murmur    HTN (hypertension) 07/06/2013   Hypercholesterolemia 08/21/2015   Hyperlipidemia 07/06/2013   Medication management 08/21/2015   Pre-diabetes    Rectal bleeding 09/21/2019   Stiffness of left shoulder joint 07/19/2013    Past Surgical History:  Procedure Laterality Date   BIOPSY  12/14/2019  Procedure: BIOPSY;  Surgeon: Daneil Dolin, MD;  [facility];  Service: Endoscopy;;   CERVICAL SPINE SURGERY     COLONOSCOPY  07/26/2012   Procedure: COLONOSCOPY;  Surgeon: Jamesetta So, MD;  [facility]: [facility];  Service: Gastroenterology;  Laterality: N/A;   COLONOSCOPY WITH PROPOFOL N/A 12/14/2019   Procedure: COLONOSCOPY WITH PROPOFOL;   Surgeon: Daneil Dolin, MD;  Location: [facility];  Service: Endoscopy;  Laterality: N/A;  9:00am   KNEE ARTHROSCOPY Left    torn ligament   LUMBAR LAMINECTOMY/DECOMPRESSION MICRODISCECTOMY Bilateral 12/27/2019   Procedure: BIATERAL LUMBAR TWO- LUMBAR THREE, LUMBAR THREE- LUMBAR FOUR, LUMBAR FOUR-FIVE LAMINOTOMY/FORAMINOTOMY 3 LEVELS;  Surgeon: Newman Pies, MD;  Location: [facility];  Service: Neurosurgery;  Laterality: Bilateral;  POSTERIOR   PVC ABLATION N/A 04/18/2021   Procedure: PVC ABLATION;  Surgeon: Vickie Epley, MD;  Location: [facility];  Service: Cardiovascular;  Laterality: N/A;   SPINE SURGERY N/A    Phreesia 02/24/2020    Outpatient Medications Prior to Visit  Medication Sig Dispense Refill   acetaminophen (TYLENOL) 650 MG CR tablet Take 1,300 mg by mouth 2 (two) times daily.     amiodarone (PACERONE) 200 MG tablet Take 1 tablet (200 mg total) by mouth 2 (two) times daily for 7 days, THEN 1 tablet (200 mg total) daily. 97 tablet 3   amLODipine (NORVASC) 10 MG tablet Take 1 tablet (10 mg total) by mouth daily. 90 tablet 1   aspirin EC 81 MG tablet Take 81 mg by mouth daily.     benazepril (LOTENSIN) 40 MG tablet Take 1 tablet (40 mg total) by mouth daily. 90 tablet 1   blood glucose meter kit and supplies Dispense based on patient and insurance preference. Use up to four times daily as directed. (FOR ICD-10 E10.9, E11.9). 1 each 0   buPROPion (WELLBUTRIN XL) 150 MG 24 hr tablet Take 1 tablet (150 mg total) by mouth daily. 90 tablet 2   Evolocumab (REPATHA SURECLICK) 940 MG/ML SOAJ INJECT 140 MG INTO THE SKIN EVERY 14 DAY 2 mL 11   gabapentin (NEURONTIN) 300 MG capsule TAKE 1 CAPSULE THREE TIMES DAILY 90 capsule 0   glipiZIDE (GLIPIZIDE XL) 2.5 MG 24 hr tablet Take 1 tablet (2.5 mg total) by mouth daily with breakfast. 90 tablet 1   magnesium oxide (MAG-OX) 400 MG tablet Take 400 mg by mouth daily.     meclizine (ANTIVERT) 25 MG tablet Take 1 tablet (25 mg total)  by mouth 3 (three) times daily as needed for dizziness. 30 tablet 0   meloxicam (MOBIC) 7.5 MG tablet Take 1 tablet (7.5 mg total) by mouth daily. 90 tablet 2   metoprolol succinate (TOPROL-XL) 25 MG 24 hr tablet Take 1 tablet (25 mg total) by mouth daily. Take with or immediately following a meal. 90 tablet 1   multivitamin-iron-minerals-folic acid (CENTRUM) chewable tablet Chew 1 tablet by mouth daily.     pantoprazole (PROTONIX) 40 MG tablet Take 1 tablet (40 mg total) by mouth daily. 90 tablet 2   spironolactone-hydrochlorothiazide (ALDACTAZIDE) 25-25 MG tablet Take 1 tablet by mouth daily. 90 tablet 1   No facility-administered medications prior to visit.     Allergies:   Metformin and related and Statins   Social History   Socioeconomic History   Marital status: Widowed    Spouse name: Cherly    Number of children: 1   Years of education: Not on file   Highest education  level: 12th grade  Occupational History    Comment: Retired   Tobacco Use   Smoking status: Never   Smokeless tobacco: Never  Vaping Use   Vaping Use: Never used  Substance and Sexual Activity   Alcohol use: No   Drug use: No   Sexual activity: Not Currently  Other Topics Concern   Not on file  Social History Narrative   Live alone right now, wife passed in 2009    [address] live in [place]      Enjoys playing golf      Diet: eats what he likes, sweet, fast food, does not eat a lot of canned or boxed foods.    Does not eat enough fruits and veggies.       Caffeine: 1 cup daily   Water: 3-4 bottles of water daily, Gatorade every now and then         Wears seat belt   Wears sun protection when golfing   Smoke detectors at home   Does not use phone while driving   Social Determinants of Health   Financial Resource Strain: Low Risk    Difficulty of Paying Living Expenses: Not hard at all  Food Insecurity: No Food Insecurity   Worried About Charity fundraiser in the Last  Year: Never true   Arboriculturist in the Last Year: Never true  Transportation Needs: No Transportation Needs   Lack of Transportation (Medical): No   Lack of Transportation (Non-Medical): No  Physical Activity: Inactive   Days of Exercise per Week: 0 days   Minutes of Exercise per Session: 0 min  Stress: No Stress Concern Present   Feeling of Stress : Not at all  Social Connections: Moderately Isolated   Frequency of Communication with Friends and Family: More than three times a week   Frequency of Social Gatherings with Friends and Family: More than three times a week   Attends Religious Services: More than 4 times per year   Active Member of [employer] or Organizations: No   Attends Archivist Meetings: Never   Marital Status: Widowed     Family History:  The patient's family history includes Asthma in his mother; Hypertension in his father.   ROS:   Please see the history of present illness.    All other systems are reviewed and are negative.  PHYSICAL EXAM:   VS:  BP 138/60 (BP Location: Left Arm, Patient Position: Sitting, Cuff Size: Large)    Pulse 60    Ht _0  (1.676 m)    Wt 215 lb 3.2 oz (97.6 kg)    SpO2 98%    BMI 34.73 kg/m      General: Alert, oriented x3, no distress, obese Head: no evidence of trauma, PERRL, EOMI, no exophtalmos or lid lag, no myxedema, no xanthelasma; normal ears, nose and oropharynx Neck: normal jugular venous pulsations and no hepatojugular reflux; brisk carotid pulses without delay and no carotid bruits Chest: clear to auscultation, no signs of consolidation by percussion or palpation, normal fremitus, symmetrical and full respiratory excursions Cardiovascular: normal position and quality of the apical impulse, regular rhythm with occasional ectopy (every 5 beats or so on average), normal first and widely split second heart sounds, no murmurs, rubs or gallops Abdomen: no tenderness or distention, no masses by palpation, no abnormal  pulsatility or arterial bruits, normal bowel sounds, no hepatosplenomegaly Extremities: no clubbing, cyanosis or edema; 2+ radial, ulnar and brachial  pulses bilaterally; 2+ right femoral, posterior tibial and dorsalis pedis pulses; 2+ left femoral, posterior tibial and dorsalis pedis pulses; no subclavian or femoral bruits Neurological: grossly nonfocal Psych: Normal mood and affect  Wt Readings from Last 3 Encounters:  05/22/21 211 lb 9.6 oz (96 kg)  04/18/21 210 lb (95.3 kg)  04/03/21 212 lb (96.2 kg)      Studies/Labs Reviewed:   EKG:  EKG is not ordered today.  Personally reviewed the tracing from his ER visit on 01/20/2021.  This shows sinus rhythm with ventricular bigeminy (monomorphic PVCs).  There appears to be an RBBB pattern in V1 and V2 with secondary polarization abnormalities.  ECG from 05/22/2021 shows this rhythm with PVCs in a pattern of trigeminy, same RBBB pattern.  14-day event monitor 02/06/2021 The dominant rhythm was normal sinus with a normal circadian rhythm variation. There are extremely frequent premature ventricular beats (representing 15.7% of all QRS complexes), with frequent ventricular couplets and rare episodes of 4-7 beat runs of nonsustained ventricular tachycardia. Although multiple PVC morphologies are seen, there is a dominant PVC morphology and occasional bigeminy and trigeminy are seen There is a remarkable reduction, almost complete abolition of ventricular ectopy during sleep Atrial ectopy is rare and there is no atrial fibrillation. A 5 beat run of atrial tachycardia seen. Patient diary was not submitted. There is mild expected bradycardia during sleep. There are no episodes of severe bradycardia or sinus pauses.   Abnormal 14-day event monitor due to the presence of extremely frequent PVCs and ventricular couplets, occasionally in the form of very brief nonsustained VT. There is a remarkable reduction in ventricular ectopy burden during  sleep.  Echocardiogram 05/17/2020: 1. Left ventricular ejection fraction, by estimation, is 55%. The left ventricle has normal function. The left ventricle has no regional wall motion abnormalities. There is mild left ventricular hypertrophy. Left ventricular diastolic parameters are consistent with Grade I diastolic dysfunction (impaired relaxation). Elevated left atrial pressure. 2. Right ventricular systolic function is normal. The right ventricular size is normal. 3. The mitral valve is normal in structure. No evidence of mitral valve regurgitation. No evidence of mitral stenosis. 4. The aortic valve is tricuspid. There is mild calcification of the aortic valve. There is mild thickening of the aortic valve. Aortic valve regurgitation is mild. No aortic stenosis is present. 5. The inferior vena cava is normal in size with greater than 50% respiratory variability,suggesting right atrial pressure of 3 mmHg.  Echocardiogram 04/08/2021:   1. Frequent PVC's. Left ventricular ejection fraction, by estimation, is  40 to 45%. The left ventricle has mildly decreased function. The left  ventricle has no regional wall motion abnormalities. The left ventricular  internal cavity size was mildly  dilated. Left ventricular diastolic parameters are consistent with Grade I  diastolic dysfunction (impaired relaxation).   2. Right ventricular systolic function is normal. The right ventricular  size is normal. There is normal pulmonary artery systolic pressure. The  estimated right ventricular systolic pressure is 00.9 mmHg.   3. Left atrial size was mildly dilated.   4. The mitral valve is normal in structure. No evidence of mitral valve  regurgitation. No evidence of mitral stenosis.   5. The aortic valve is tricuspid. There is moderate calcification of the  aortic valve. There is moderate thickening of the aortic valve. Aortic  valve regurgitation is mild. Mild to moderate aortic valve   sclerosis/calcification is present, without any  evidence of aortic stenosis.   6. Pulmonic valve regurgitation is  moderate.   7. The inferior vena cava is normal in size with greater than 50%  respiratory variability, suggesting right atrial pressure of 3 mmHg.   Comparison(s): Prior images reviewed side by side. The left ventricular  function is worsened.   Nuclear stress test 01/30/2019:  There was no ST segment deviation noted during stress. This is a low risk study. Nuclear stress EF: 47%. Defect 1: There is a medium defect of mild severity present in the basal inferior and mid inferior location. This is likely due to soft tissue attenuation. No large ischemic territories.  Recent Labs: 02/25/2021: ALT 20; TSH 1.660 03/05/2021: Magnesium 2.2 04/03/2021: BUN 12; Creatinine, Ser 1.14; Hemoglobin 14.4; Platelets 302; Potassium 4.1; Sodium 148  BMET    Component Value Date/Time   NA 148 (H) 04/03/2021 1430   K 4.1 04/03/2021 1430   CL 108 (H) 04/03/2021 1430   CO2 23 04/03/2021 1430   GLUCOSE 135 (H) 04/03/2021 1430   GLUCOSE 125 (H) 03/05/2021 0410   BUN 12 04/03/2021 1430   CREATININE 1.14 04/03/2021 1430   CREATININE 1.13 06/28/2019 0830   CALCIUM 9.1 04/03/2021 1430   GFRNONAA >60 03/05/2021 0410   GFRNONAA 62 06/28/2019 0830   GFRAA 53 (L) 07/24/2020 0913   GFRAA 72 06/28/2019 0830    Lipid Panel    Component Value Date/Time   CHOL 126 02/25/2021 0837   TRIG 165 (H) 02/25/2021 0837   HDL 44 02/25/2021 0837   CHOLHDL 2.9 08/01/2020 0852   CHOLHDL 3.3 01/29/2019 0800   VLDL 30 01/29/2019 0800   LDLCALC 54 02/25/2021 0837     ASSESSMENT:    No diagnosis found.   PLAN:  In order of problems listed above:  PVCs/NSVT: Fatigue and effective bradycardia due to frequent PVCs.  Unable to ablate the PVCs due to their origin very close to the His bundle.  Started amiodarone in early November.  Already has some subjective improvement.  Still has some ectopy on exam,  but less frequent.  Continue amiodarone.  Will need about another 6 weeks to reach steady state.  Has a follow-up appointment with Tommye Standard in February. Amiodarone: Discussed the multiple potential side effects.  Normal LFTs and TSH in August 2022.  Needs liver function tests and TSH rechecked when he comes back to see Renee in February.  Every 6 months after that.  Needs yearly ophthalmological exam.  Report unexplained respiratory symptoms promptly.  Ask prescribing physicians and pharmacist to review any new medications for interactions with amiodarone. 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  No evidence of perfusion abnormalities.  He does have some evidence of diastolic dysfunction on the echo.  Reevaluate EF after several months on amiodarone. HTN: Currently fairly well controlled.  Continue same medications. 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  Excellent response to Repatha.  No side effects with Repatha.  He is due for refill.   CAD: He does not have angina pectoris and did not have any severe obstructive lesions when he had his coronary angiogram or perfusion defects on his last stress test. DM/obesity: Well-controlled.  Last hemoglobin A1c 6.6%. Bilateral leg pain: Appears to have neurogenic claudication, on gabapentin.  He had normal ABI , February 2020.    Medication Adjustments/Labs and Tests Ordered: Current medicines are reviewed at length with the patient today.  Concerns regarding medicines are outlined above.  Medication changes, Labs and Tests ordered today are  listed in the Patient Instructions below. There are no Patient Instructions on file for this visit.     Signed, Sanda Klein, MD  07/03/2021 7:01 PM    [facility] [place], [place], [place]  [address] Phone: [phone]; Fax: [phone]

## 2021-07-04 ENCOUNTER — Ambulatory Visit: Payer: Medicare HMO | Admitting: Cardiovascular Disease

## 2021-07-04 ENCOUNTER — Other Ambulatory Visit: Payer: Self-pay

## 2021-07-04 ENCOUNTER — Encounter: Payer: Self-pay | Admitting: Cardiovascular Disease

## 2021-07-04 VITALS — BP 138/60 | HR 60 | Ht 66.0 in | Wt 215.2 lb

## 2021-07-04 DIAGNOSIS — I5042 Chronic combined systolic (congestive) and diastolic (congestive) heart failure: Secondary | ICD-10-CM

## 2021-07-04 DIAGNOSIS — I10 Essential (primary) hypertension: Secondary | ICD-10-CM

## 2021-07-04 DIAGNOSIS — I2510 Atherosclerotic heart disease of native coronary artery without angina pectoris: Secondary | ICD-10-CM

## 2021-07-04 DIAGNOSIS — E669 Obesity, unspecified: Secondary | ICD-10-CM | POA: Diagnosis not present

## 2021-07-04 DIAGNOSIS — M48062 Spinal stenosis, lumbar region with neurogenic claudication: Secondary | ICD-10-CM | POA: Diagnosis not present

## 2021-07-04 DIAGNOSIS — E1169 Type 2 diabetes mellitus with other specified complication: Secondary | ICD-10-CM

## 2021-07-04 DIAGNOSIS — I493 Ventricular premature depolarization: Secondary | ICD-10-CM

## 2021-07-04 NOTE — Patient Instructions (Signed)
Medication Instructions:  No changes *If you need a refill on your cardiac medications before your next appointment, please call your pharmacy*   Lab Work: None ordered If you have labs (blood work) drawn today and your tests are completely normal, you will receive your results only by: [facility] (if you have MyChart) OR A paper copy in the mail If you have any lab test that is abnormal or we need to change your treatment, we will call you to review the results.   Testing/Procedures: None ordered   Follow-Up: At [facility], you and your health needs are our priority.  As part of our continuing mission to provide you with exceptional heart care, we have created designated Provider Care Teams.  These Care Teams include your primary Cardiologist (physician) and Advanced Practice Providers (APPs -  Physician Assistants and Nurse Practitioners) who all work together to provide you with the care you need, when you need it.  We recommend signing up for the patient portal called "MyChart".  Sign up information is provided on this After Visit Summary.  MyChart is used to connect with patients for Virtual Visits (Telemedicine).  Patients are able to view lab/test results, encounter notes, upcoming appointments, etc.  Non-urgent messages can be sent to your provider as well.   To learn more about what you can do with MyChart, go to [email or web address].    Your next appointment:   9 month(s)  The format for your next appointment:   In Person  Provider:   Sanda Klein, MD

## 2021-07-16 ENCOUNTER — Other Ambulatory Visit: Payer: Self-pay | Admitting: Cardiovascular Disease

## 2021-07-30 ENCOUNTER — Ambulatory Visit: Payer: Medicare HMO | Admitting: Cardiovascular Disease

## 2021-08-24 NOTE — Progress Notes (Signed)
Cardiology Office Note Date:  08/24/2021  Patient ID:  Douglas Keller Jul 15, 1941, MRN [id number] PCP:  Noreene Larsson, NP  Cardiologist:  Dr. Sallyanne Kuster Electrophysiologist: Dr. Quentin Ore    Chief Complaint: 3 mo   History of Present Illness: Douglas Keller is a 81 y.o. male with history of DM, HTN, HLD (statin intolerant > repatha), PVCs, NICM (cath 2006 showed  non obstructive atherosclerosis (40% proximal LAD, 40% mid RCA) and had nuclear stress test in 2018 showed normal perfusion), chronic CHF (systolic), suspect 2/2 PVCs  He comes in today to be seen for Dr. Quentin Ore, last seen by him Nov 2022, he was post EPS, unfortunately finding the origin of the PVC was mapped to adjacent to the his bundle and thus was not targeted with RF Given symptoms persisted with his PVCs, started on amiodarone  More recently saw Dr. Sallyanne Kuster, had been started on amiodarone and was feeling improved, better energy, back at the gym and pursuing weight loss efforts. EP follow up was in place, planned for surveillance labs, d/w the pt annual eye exams and importance of reporting new pulm symptoms  TODAY He is very tired. He reports that he sleeps at best maybe 2 hours.  He can not fall asleep, no symptoms, not SOB, just can not fall asleep. His PMD has Rx trazodone, but he lives alone and worries about side effects He asks about sleep apnea, nut his problem is not waking, snoring, he is can not fall asleep He is not napping in the day, says if he sits he may drift to sleep but sleeps no more then a few minutes with these  No CP, palpitations NO SOB Denies DOE, or exertional incapacities He gets quite dizzy upon standing, not otherwise, no syncope or near sycnope. He stopped the magnesium was not sure if he was to continue it or not    AAD Amiodarone Nov 2022 for PVCs  Past Medical History:  Diagnosis Date   Aortic valve sclerosis    Atherosclerosis of coronary artery without angina pectoris  07/05/2014   Cervical spondylosis without myelopathy 02/13/2013   Chest pain 01/29/2019   Degeneration of cervical intervertebral disc 02/13/2013   Depression    sometimes   Diabetes mellitus type 2, diet-controlled (HCC)    Elevated troponin    GERD (gastroesophageal reflux disease)    Heart murmur    HTN (hypertension) 07/06/2013   Hypercholesterolemia 08/21/2015   Hyperlipidemia 07/06/2013   Medication management 08/21/2015   Pre-diabetes    Rectal bleeding 09/21/2019   Stiffness of left shoulder joint 07/19/2013    Past Surgical History:  Procedure Laterality Date   BIOPSY  12/14/2019   Procedure: BIOPSY;  Surgeon: Daneil Dolin, MD;  [facility];  Service: Endoscopy;;   CERVICAL SPINE SURGERY     COLONOSCOPY  07/26/2012   Procedure: COLONOSCOPY;  Surgeon: Jamesetta So, MD;  [facility];  Service: Gastroenterology;  Laterality: N/A;   COLONOSCOPY WITH PROPOFOL N/A 12/14/2019   Procedure: COLONOSCOPY WITH PROPOFOL;  Surgeon: Daneil Dolin, MD;  [facility];  Service: Endoscopy;  Laterality: N/A;  9:00am   KNEE ARTHROSCOPY Left    torn ligament   LUMBAR LAMINECTOMY/DECOMPRESSION MICRODISCECTOMY Bilateral 12/27/2019   Procedure: BIATERAL LUMBAR TWO- LUMBAR THREE, LUMBAR THREE- LUMBAR FOUR, LUMBAR FOUR-FIVE LAMINOTOMY/FORAMINOTOMY 3 LEVELS;  Surgeon: Newman Pies, MD;  Location: [facility];  Service: Neurosurgery;  Laterality: Bilateral;  POSTERIOR   PVC ABLATION N/A 04/18/2021   Procedure:  PVC ABLATION;  Surgeon: Vickie Epley, MD;  Location: [facility];  Service: Cardiovascular;  Laterality: N/A;   SPINE SURGERY N/A    Phreesia 02/24/2020    Current Outpatient Medications  Medication Sig Dispense Refill   acetaminophen (TYLENOL) 650 MG CR tablet Take 1,300 mg by mouth 2 (two) times daily.     amiodarone (PACERONE) 200 MG tablet Take 1 tablet (200 mg total) by mouth 2 (two) times daily for 7 days, THEN 1 tablet (200 mg total) daily. 97  tablet 3   amLODipine (NORVASC) 10 MG tablet Take 1 tablet (10 mg total) by mouth daily. 90 tablet 1   aspirin EC 81 MG tablet Take 81 mg by mouth daily.     benazepril (LOTENSIN) 40 MG tablet Take 1 tablet (40 mg total) by mouth daily. 90 tablet 1   blood glucose meter kit and supplies Dispense based on patient and insurance preference. Use up to four times daily as directed. (FOR ICD-10 E10.9, E11.9). (Patient not taking: Reported on 07/04/2021) 1 each 0   buPROPion (WELLBUTRIN XL) 150 MG 24 hr tablet Take 1 tablet (150 mg total) by mouth daily. 90 tablet 2   Evolocumab (REPATHA SURECLICK) 875 MG/ML SOAJ INJECT 140 MG INTO THE SKIN EVERY 14 DAY 2 mL 11   gabapentin (NEURONTIN) 300 MG capsule TAKE 1 CAPSULE THREE TIMES DAILY 90 capsule 0   glipiZIDE (GLIPIZIDE XL) 2.5 MG 24 hr tablet Take 1 tablet (2.5 mg total) by mouth daily with breakfast. 90 tablet 1   magnesium oxide (MAG-OX) 400 MG tablet Take 400 mg by mouth daily.     meclizine (ANTIVERT) 25 MG tablet Take 1 tablet (25 mg total) by mouth 3 (three) times daily as needed for dizziness. 30 tablet 0   meloxicam (MOBIC) 7.5 MG tablet Take 1 tablet (7.5 mg total) by mouth daily. (Patient not taking: Reported on 07/04/2021) 90 tablet 2   metoprolol succinate (TOPROL-XL) 25 MG 24 hr tablet TAKE 1 TABLET DAILY. TAKE WITH OR IMMEDIATELY FOLLOWING A MEAL. 90 tablet 3   multivitamin-iron-minerals-folic acid (CENTRUM) chewable tablet Chew 1 tablet by mouth daily.     pantoprazole (PROTONIX) 40 MG tablet Take 1 tablet (40 mg total) by mouth daily. 90 tablet 2   spironolactone-hydrochlorothiazide (ALDACTAZIDE) 25-25 MG tablet Take 1 tablet by mouth daily. 90 tablet 1   No current facility-administered medications for this visit.    Allergies:   Metformin and related and Statins   Social History:  The patient  reports that he has never smoked. He has never used smokeless tobacco. He reports that he does not drink alcohol and does not use drugs.    Family History:  The patient's family history includes Asthma in his mother; Hypertension in his father.  ROS:  Please see the history of present illness.    All other systems are reviewed and otherwise negative.   PHYSICAL EXAM:  VS:  There were no vitals taken for this visit. BMI: There is no height or weight on file to calculate BMI. Well nourished, well developed, in no acute distress HEENT: normocephalic, atraumatic Neck: no JVD, carotid bruits or masses Cardiac:  RRR; no ectopy with prolonged auscultation, no significant murmurs, no rubs, or gallops Lungs:  CTA b/l, no wheezing, rhonchi or rales Abd: soft, nontender MS: no deformity or atrophy Ext: no edema Skin: warm and dry, no rash Neuro:  No gross deficits appreciated Psych: euthymic mood, full affect  EKG:  Done today  and reviewed by myself shows  SB 57, PVCs (2)   04/18/21: EPS CONCLUSIONS: 1.  Frequent PVCs, symptomatic 2.  Appears to be multifocal PVCs with today's morphology originating near the His bundle. 3.  Pharmacologic suppression is indicated.  I would not recommend repeat EP study given the multifocal nature of his PVCs and site of origin near the His-Purkinje system 4. No early apparent complications.   04/08/21: TTE 1. Frequent PVC's. Left ventricular ejection fraction, by estimation, is  40 to 45%. The left ventricle has mildly decreased function. The left  ventricle has no regional wall motion abnormalities. The left ventricular  internal cavity size was mildly  dilated. Left ventricular diastolic parameters are consistent with Grade I  diastolic dysfunction (impaired relaxation).   2. Right ventricular systolic function is normal. The right ventricular  size is normal. There is normal pulmonary artery systolic pressure. The  estimated right ventricular systolic pressure is 61.9 mmHg.   3. Left atrial size was mildly dilated.   4. The mitral valve is normal in structure. No evidence of mitral  valve  regurgitation. No evidence of mitral stenosis.   5. The aortic valve is tricuspid. There is moderate calcification of the  aortic valve. There is moderate thickening of the aortic valve. Aortic  valve regurgitation is mild. Mild to moderate aortic valve  sclerosis/calcification is present, without any  evidence of aortic stenosis.   6. Pulmonic valve regurgitation is moderate.   7. The inferior vena cava is normal in size with greater than 50%  respiratory variability, suggesting right atrial pressure of 3 mmHg.   Comparison(s): Prior images reviewed side by side. The left ventricular  function is worsened.   01/2021: Monitor The dominant rhythm was normal sinus with a normal circadian rhythm variation. There are extremely frequent premature ventricular beats (representing 15.7% of all QRS complexes), with frequent ventricular couplets and rare episodes of 4-7 beat runs of nonsustained ventricular tachycardia. Although multiple PVC morphologies are seen, there is a dominant PVC morphology and occasional bigeminy and trigeminy are seen There is a remarkable reduction, almost complete abolition of ventricular ectopy during sleep Atrial ectopy is rare and there is no atrial fibrillation. A 5 beat run of atrial tachycardia seen. Patient diary was not submitted. There is mild expected bradycardia during sleep. There are no episodes of severe bradycardia or sinus pauses.   Abnormal 14-day event monitor due to the presence of extremely frequent PVCs and ventricular couplets, occasionally in the form of very brief nonsustained VT. There is a remarkable reduction in ventricular ectopy burden during sleep.  05/17/2020: TTE IMPRESSIONS   1. Left ventricular ejection fraction, by estimation, is 55%. The left  ventricle has normal function. The left ventricle has no regional wall  motion abnormalities. There is mild left ventricular hypertrophy. Left  ventricular diastolic parameters are   consistent with Grade I diastolic dysfunction (impaired relaxation).  Elevated left atrial pressure.   2. Right ventricular systolic function is normal. The right ventricular  size is normal.   3. The mitral valve is normal in structure. No evidence of mitral valve  regurgitation. No evidence of mitral stenosis.   4. The aortic valve is tricuspid. There is mild calcification of the  aortic valve. There is mild thickening of the aortic valve. Aortic valve  regurgitation is mild. No aortic stenosis is present.   5. The inferior vena cava is normal in size with greater than 50%  respiratory variability, suggesting right atrial pressure of 3  mmHg.    Recent Labs: 02/25/2021: ALT 20; TSH 1.660 03/05/2021: Magnesium 2.2 04/03/2021: BUN 12; Creatinine, Ser 1.14; Hemoglobin 14.4; Platelets 302; Potassium 4.1; Sodium 148  02/25/2021: Cholesterol, Total 126; HDL 44; LDL Chol Calc (NIH) 54; Triglycerides 165   CrCl cannot be calculated (Patient's most recent lab result is older than the maximum 21 days allowed.).   Wt Readings from Last 3 Encounters:  07/04/21 215 lb 3.2 oz (97.6 kg)  05/22/21 211 lb 9.6 oz (96 kg)  04/18/21 210 lb (95.3 kg)     Other studies reviewed: Additional studies/records reviewed today include: summarized above  ASSESSMENT AND PLAN:  PVCs EPS as above, unable to ablate Amiodarone started Nov 2022 Some PVCs on his EKG, none heard with prolonged auscultation labs today 3 day monitor to see his burden   NICM Chronic CHF (systolic) LVEF 97-74% by echo in Sept If his PVC burden is improved will plan an echo No symptoms or exam findings of volume OL  4. Insomnia I have urged him to discuss further with his PMD given his reluctance to try the trazodone  5. Orthostatic dizziness Reduce his amlodipine to 62m daily Encouraged adequate hydration   Disposition: F/u with pending monitor/echo, keep scheduled recalls as is for now  Current medicines are reviewed at  length with the patient today.  The patient did not have any concerns regarding medicines.  SVenetia Night PA-C 08/24/2021 8:39 AM     [place] [phone](office)  [phone](fax)

## 2021-08-25 ENCOUNTER — Other Ambulatory Visit: Payer: Self-pay

## 2021-08-25 MED ORDER — GLIPIZIDE ER 2.5 MG PO TB24: 2.5000 mg | ORAL_TABLET | Freq: Every day | ORAL | 1 refills | Status: DC

## 2021-08-27 ENCOUNTER — Other Ambulatory Visit: Payer: Self-pay

## 2021-08-27 ENCOUNTER — Ambulatory Visit: Payer: Medicare HMO | Admitting: Physician Assistant

## 2021-08-27 ENCOUNTER — Ambulatory Visit (INDEPENDENT_AMBULATORY_CARE_PROVIDER_SITE_OTHER): Payer: Medicare HMO

## 2021-08-27 ENCOUNTER — Other Ambulatory Visit: Payer: Self-pay | Admitting: Physician Assistant

## 2021-08-27 ENCOUNTER — Encounter: Payer: Self-pay | Admitting: Physician Assistant

## 2021-08-27 VITALS — BP 108/68 | HR 57 | Ht 66.0 in | Wt 210.0 lb

## 2021-08-27 DIAGNOSIS — Z79899 Other long term (current) drug therapy: Secondary | ICD-10-CM | POA: Diagnosis not present

## 2021-08-27 DIAGNOSIS — I493 Ventricular premature depolarization: Secondary | ICD-10-CM

## 2021-08-27 DIAGNOSIS — R42 Dizziness and giddiness: Secondary | ICD-10-CM

## 2021-08-27 DIAGNOSIS — I10 Essential (primary) hypertension: Secondary | ICD-10-CM | POA: Diagnosis not present

## 2021-08-27 DIAGNOSIS — I5022 Chronic systolic (congestive) heart failure: Secondary | ICD-10-CM

## 2021-08-27 DIAGNOSIS — I428 Other cardiomyopathies: Secondary | ICD-10-CM | POA: Diagnosis not present

## 2021-08-27 LAB — COMPREHENSIVE METABOLIC PANEL
ALT: 13 IU/L (ref 0–44)
AST: 18 IU/L (ref 0–40)
Albumin/Globulin Ratio: 1.5 (ref 1.2–2.2)
Albumin: 4.3 g/dL (ref 3.7–4.7)
Alkaline Phosphatase: 79 IU/L (ref 44–121)
BUN/Creatinine Ratio: 10 (ref 10–24)
BUN: 14 mg/dL (ref 8–27)
Bilirubin Total: 0.5 mg/dL (ref 0.0–1.2)
CO2: 26 mmol/L (ref 20–29)
Calcium: 9.5 mg/dL (ref 8.6–10.2)
Chloride: 104 mmol/L (ref 96–106)
Creatinine, Ser: 1.45 mg/dL — ABNORMAL HIGH (ref 0.76–1.27)
Globulin, Total: 2.8 g/dL (ref 1.5–4.5)
Glucose: 90 mg/dL (ref 70–99)
Potassium: 4.1 mmol/L (ref 3.5–5.2)
Sodium: 142 mmol/L (ref 134–144)
Total Protein: 7.1 g/dL (ref 6.0–8.5)
eGFR: 49 mL/min/{1.73_m2} — ABNORMAL LOW (ref >59)

## 2021-08-27 LAB — TSH: TSH: 2.27 u[IU]/mL (ref 0.450–4.500)

## 2021-08-27 LAB — MAGNESIUM: Magnesium: 2.1 mg/dL (ref 1.6–2.3)

## 2021-08-27 MED ORDER — AMLODIPINE BESYLATE 5 MG PO TABS: 5.0000 mg | ORAL_TABLET | Freq: Every day | ORAL | 1 refills | Status: DC

## 2021-08-27 NOTE — Patient Instructions (Signed)
Medication Instructions:    START TAKING AMLODIPINE  5 MG ONCE A DAY   *If you need a refill on your cardiac medications before your next appointment, please call your pharmacy*    Lab Work:  CMET MAG  AND TSH  TODAY   If you have labs (blood work) drawn today and your tests are completely normal, you will receive your results only by: [place] (if you have MyChart) OR A paper copy in the mail If you have any lab test that is abnormal or we need to change your treatment, we will call you to review the results.   Testing/Procedures: Your physician has recommended that you wear an event monitor. Event monitors are medical devices that record the hearts electrical activity. Doctors most often [place] these monitors to diagnose arrhythmias. Arrhythmias are problems with the speed or rhythm of the heartbeat. The monitor is a small, portable device. You can wear one while you do your normal daily activities. This is usually used to diagnose what is causing palpitations/syncope (passing out).   Follow-Up: At [facility], you and your health needs are our priority.  As part of our continuing mission to provide you with exceptional heart care, we have created designated Provider Care Teams.  These Care Teams include your primary Cardiologist (physician) and Advanced Practice Providers (APPs -  Physician Assistants and Nurse Practitioners) who all work together to provide you with the care you need, when you need it.  We recommend signing up for the patient portal called "MyChart".  Sign up information is provided on this After Visit Summary.  MyChart is used to connect with patients for Virtual Visits (Telemedicine).  Patients are able to view lab/test results, encounter notes, upcoming appointments, etc.  Non-urgent messages can be sent to your provider as well.   To learn more about what you can do with MyChart, go to [email or web address].    Your next appointment:   6 month(s)  The  format for your next appointment:   In Person  Provider:   You may see Vickie Epley, MD or one of the following Advanced Practice Providers on your designated Care Team:   Tommye Standard, [place] Legrand Como "Jonni Sanger" Chalmers Cater, [place]   Other Instructions:  [facility]ryn Gulling- Long Term Monitor Instructions  Your physician has requested you wear a ZIO patch monitor for 3 days.  This is a single patch monitor. [employer] supplies one patch monitor per enrollment. Additional stickers are not available. Please do not apply patch if you will be having a Nuclear Stress Test,  Echocardiogram, Cardiac CT, MRI, or Chest Xray during the period you would be wearing the  monitor. The patch cannot be worn during these tests. You cannot remove and re-apply the  ZIO XT patch monitor.  Your ZIO patch monitor will be mailed 3 day USPS to your address on file. It may take 3-5 days  to receive your monitor after you have been enrolled.  Once you have received your monitor, please review the enclosed instructions. Your monitor  has already been registered assigning a specific monitor serial # to you.  Billing and Patient Assistance Program Information  We have supplied [employer] with any of your insurance information on file for billing purposes. [employer] offers a sliding scale Patient Assistance Program for patients that do not have  insurance, or whose insurance does not completely cover the cost of the ZIO monitor.  You must apply for the Patient Assistance Program to qualify for this discounted rate.  To apply, please call [employer] at [phone], select option 4, select option 2, ask to apply for  Patient Assistance Program. Theodore Demark will ask your household income, and how many people  are in your household. They will quote your out-of-pocket cost based on that information.  [employer] will also be able to set up a 22-month, interest-free payment plan if needed.  Applying the monitor   Shave hair from upper left chest.  Hold  abrader disc by orange tab. Rub abrader in 40 strokes over the upper left chest as  indicated in your monitor instructions.  Clean area with 4 enclosed alcohol pads. Let dry.  Apply patch as indicated in monitor instructions. Patch will be placed under collarbone on left  side of chest with arrow pointing upward.  Rub patch adhesive wings for 2 minutes. Remove white label marked "1". Remove the white  label marked "2". Rub patch adhesive wings for 2 additional minutes.  While looking in a mirror, press and release button in center of patch. A small green light will  flash 3-4 times. This will be your only indicator that the monitor has been turned on.  Do not shower for the first 24 hours. You may shower after the first 24 hours.  Press the button if you feel a symptom. You will hear a small click. Record Date, Time and  Symptom in the Patient Logbook.  When you are ready to remove the patch, follow instructions on the last 2 pages of Patient  Logbook. Stick patch monitor onto the last page of Patient Logbook.  Place Patient Logbook in the blue and white box. Use locking tab on box and tape box closed  securely. The blue and white box has prepaid postage on it. Please place it in the mailbox as  soon as possible. Your physician should have your test results approximately 7 days after the  monitor has been mailed back to [facility].  Call [place] at [phone] if you have questions regarding  your ZIO XT patch monitor. Call them immediately if you see an orange light blinking on your  monitor.  If your monitor falls off in less than 4 days, contact our Monitor department at [phone].  If your monitor becomes loose or falls off after 4 days call [employer] at [phone] for  suggestions on securing your monitor

## 2021-08-27 NOTE — Progress Notes (Unsigned)
Enrolled patient for a 3 day Zio XT monitor to be mailed to patients home  Dr Quentin Ore to read

## 2021-08-28 ENCOUNTER — Ambulatory Visit: Payer: Medicare HMO | Admitting: Nurse Practitioner

## 2021-08-28 ENCOUNTER — Other Ambulatory Visit: Payer: Self-pay | Admitting: *Deleted

## 2021-08-28 ENCOUNTER — Telehealth: Payer: Self-pay | Admitting: *Deleted

## 2021-08-28 ENCOUNTER — Encounter: Payer: Self-pay | Admitting: Nurse Practitioner

## 2021-08-28 ENCOUNTER — Ambulatory Visit (INDEPENDENT_AMBULATORY_CARE_PROVIDER_SITE_OTHER): Payer: Medicare HMO | Admitting: Nurse Practitioner

## 2021-08-28 VITALS — BP 131/63 | HR 52 | Ht 66.0 in | Wt 210.0 lb

## 2021-08-28 DIAGNOSIS — Z139 Encounter for screening, unspecified: Secondary | ICD-10-CM | POA: Diagnosis not present

## 2021-08-28 DIAGNOSIS — I10 Essential (primary) hypertension: Secondary | ICD-10-CM

## 2021-08-28 DIAGNOSIS — E66811 Obesity, class 1: Secondary | ICD-10-CM

## 2021-08-28 DIAGNOSIS — E119 Type 2 diabetes mellitus without complications: Secondary | ICD-10-CM | POA: Diagnosis not present

## 2021-08-28 DIAGNOSIS — E669 Obesity, unspecified: Secondary | ICD-10-CM

## 2021-08-28 DIAGNOSIS — E1169 Type 2 diabetes mellitus with other specified complication: Secondary | ICD-10-CM

## 2021-08-28 DIAGNOSIS — R7301 Impaired fasting glucose: Secondary | ICD-10-CM | POA: Diagnosis not present

## 2021-08-28 DIAGNOSIS — G8929 Other chronic pain: Secondary | ICD-10-CM

## 2021-08-28 DIAGNOSIS — H6121 Impacted cerumen, right ear: Secondary | ICD-10-CM

## 2021-08-28 DIAGNOSIS — H6120 Impacted cerumen, unspecified ear: Secondary | ICD-10-CM | POA: Insufficient documentation

## 2021-08-28 DIAGNOSIS — Z79899 Other long term (current) drug therapy: Secondary | ICD-10-CM

## 2021-08-28 DIAGNOSIS — E785 Hyperlipidemia, unspecified: Secondary | ICD-10-CM

## 2021-08-28 DIAGNOSIS — M25561 Pain in right knee: Secondary | ICD-10-CM | POA: Diagnosis not present

## 2021-08-28 DIAGNOSIS — N179 Acute kidney failure, unspecified: Secondary | ICD-10-CM | POA: Diagnosis not present

## 2021-08-28 NOTE — Assessment & Plan Note (Signed)
Importance of healthy food choices with portion control discussed as well as eating regularly within 12  hour window.   The need to choose clean green food 50%-75% of time is discussed as well as make water the primary drink and set a goal for 64 ounces daily.  Patient reeducated about the importance of committment to minimum of 150 minutes of exercise per week.  Three meals at set times with snacks allowed between meals but they must be fruit or vegetable.   Aim to eat  over 12 hour period  for example 7 am to 7 pm. Stop after your last meal of the day.  Wt Readings from Last 3 Encounters:  08/28/21 210 lb 0.6 oz (95.3 kg)  08/27/21 210 lb (95.3 kg)  07/04/21 215 lb 3.2 oz (97.6 kg)

## 2021-08-28 NOTE — Telephone Encounter (Signed)
Spoke with patient about results aware and verbalized understanding.Patient encourage to keep himself hydrated with drinking water with meals. Patient scheduled for labs on 09-11-21.

## 2021-08-28 NOTE — Assessment & Plan Note (Signed)
Lab Results  Component Value Date   HGBA1C 6.6 (H) 02/25/2021   A1C today  Foot exam today Schedule diabetic eye exam today.

## 2021-08-28 NOTE — Assessment & Plan Note (Signed)
Chronic Knee pain ,tingling , numbness, had back surgery 2 years ago   Pt told to restart taking gabapentin, continue tylenol as needed.

## 2021-08-28 NOTE — Assessment & Plan Note (Signed)
DASH diet and commitment to daily physical activity for a minimum of 30 minutes discussed and encouraged, as a part of hypertension management. The importance of attaining a healthy weight is also discussed.  BP/Weight 08/28/2021 08/27/2021 07/04/2021 05/22/2021 04/18/2021 04/03/2021 8/47/8412  Systolic BP 820 813 887 195 974 718 550  Diastolic BP 63 68 60 76 73 62 64  Wt. (Lbs) 210.04 210 215.2 211.6 210 212 214.4  BMI 33.9 33.89 34.73 34.15 33.89 34.22 34.61  continue current meds.

## 2021-08-28 NOTE — Assessment & Plan Note (Addendum)
He has been using debrox ear was removal at home. Ear larvage done today.  Some wax came out after the procedure.  Patient stated that he could hear better. Patient told to continue Debrox as needed at home.

## 2021-08-28 NOTE — Telephone Encounter (Signed)
-----   Message from [place], [place] sent at 08/27/2021  6:59 PM EST ----- Creat uis unusually high, I suspect he is a little dehydrated, please ask him to hydrate better and repeat BMET ina couple weeks please.  Otherwise labs look good

## 2021-08-28 NOTE — Patient Instructions (Addendum)
Schedule diabetic eye exam today.  Please get your labs today Please start taking your gabapentin  for your nerve pain.   It is important that you exercise regularly at least 30 minutes 5 times a week.  Think about what you will eat, plan ahead. Choose " clean, green, fresh or frozen" over canned, processed or packaged foods which are more sugary, salty and fatty. 70 to 75% of food eaten should be vegetables and fruit. Three meals at set times with snacks allowed between meals, but they must be fruit or vegetables. Aim to eat over a 12 hour period , example 7 am to 7 pm, and STOP after  your last meal of the day. Drink water,generally about 64 ounces per day, no other drink is as healthy. Fruit juice is best enjoyed in a healthy way, by EATING the fruit.  Thanks for choosing [facility], we consider it a privelige to serve you.

## 2021-08-28 NOTE — Assessment & Plan Note (Signed)
Foot exam completed today. Patient referred to podiatry for foot care.

## 2021-08-28 NOTE — Progress Notes (Signed)
° °  Douglas Keller     MRN: [id number]      DOB: 1941-02-10   HPI Douglas Keller is here for follow up and re-evaluation of chronic medical conditions, medication management and review of any available recent lab and radiology data.  Preventive health is updated, specifically  Cancer screening and Immunization.   Questions or concerns regarding consultations or procedures which the PT has had in the interim are  addressed. The PT denies any adverse reactions to current medications since the last visit.   Pt c/o chronic burning, aching,numbness from knee to the foot of right leg, he had back surgery in 2021. He sees Dr. Aline Brochure, he has a follow up in June.  He has not been taking gabapentin 300mg . Pt told to restart med , he verbalized understanding. He takes meloxicam PRN  Pt c/o of impaired  hearing on his right ear, he stated that he has been using OTC ear wax removal med he bought from the pharmacy. He would like to have his ears examined today.   Pt stated that he got 2 shingles vaccine at [facility] , will bring vaccine card to his next appointment.     ROS Denies recent fever or chills. Denies sinus pressure, nasal congestion, ear pain or sore throat. Has trouble hearing on his right ear Denies chest congestion, productive cough or wheezing. Denies chest pains, palpitations and leg swelling Denies abdominal pain, nausea, vomiting,diarrhea or constipation.   Denies dysuria, frequency, hesitancy or incontinence. Has right knee joint pain with tingling and numbness, no swelling and limitation in mobility. Denies headaches, seizures, numbness, or tingling. Denies depression, anxiety or insomnia. Denies skin break down or rash.   PE  BP 131/63 (BP Location: Left Arm, Patient Position: Sitting, Cuff Size: Normal)    Pulse (!) 52    Ht 5\' 6"  (1.676 m)    Wt 210 lb 0.6 oz (95.3 kg)    SpO2 97%    BMI 33.90 kg/m   Patient alert and oriented and in no cardiopulmonary distress.  HEENT: No  facial asymmetry, EOMI,     Neck supple . Right ear had cerumen impaction, no drainage no redness  Chest: Clear to auscultation bilaterally.  CVS: S1, S2 no murmurs, no S3.Regular rate.  ABD: Soft non tender.   Ext: No edema  MS: Adequate ROM spine, shoulders, hips and knees.  Skin: Intact, no ulcerations or rash noted.  Psych: Good eye contact, normal affect. Memory intact not anxious or depressed appearing.  CNS: CN 2-12 intact, power,  normal throughout.no focal deficits noted.   Assessment & Plan

## 2021-08-28 NOTE — Assessment & Plan Note (Signed)
Lab Results  °Component Value Date  ° NA 142 08/27/2021  ° K 4.1 08/27/2021  ° CO2 26 08/27/2021  ° GLUCOSE 90 08/27/2021  ° BUN 14 08/27/2021  ° CREATININE 1.45 (H) 08/27/2021  ° CALCIUM 9.5 08/27/2021  ° EGFR 49 (L) 08/27/2021  ° GFRNONAA >60 03/05/2021  °pt told to avoid nsaid °Pt stated that he has not been drinking enough water, need to stay hydrated discussed with pt. °Cardiology plans to recheck labs in a few weeks.  °

## 2021-08-29 DIAGNOSIS — Z79899 Other long term (current) drug therapy: Secondary | ICD-10-CM | POA: Diagnosis not present

## 2021-08-29 DIAGNOSIS — I493 Ventricular premature depolarization: Secondary | ICD-10-CM | POA: Diagnosis not present

## 2021-08-29 LAB — LIPID PANEL
Chol/HDL Ratio: 2.9 ratio (ref 0.0–5.0)
Cholesterol, Total: 120 mg/dL (ref 100–199)
HDL: 42 mg/dL (ref >39)
LDL Chol Calc (NIH): 54 mg/dL (ref 0–99)
Triglycerides: 141 mg/dL (ref 0–149)
VLDL Cholesterol Cal: 24 mg/dL (ref 5–40)

## 2021-08-29 LAB — HEMOGLOBIN A1C
Est. average glucose Bld gHb Est-mCnc: 131 mg/dL
Hgb A1c MFr Bld: 6.2 % — ABNORMAL HIGH (ref 4.8–5.6)

## 2021-08-29 LAB — HEPATITIS C ANTIBODY: Hep C Virus Ab: <0.1 s/co ratio (ref 0.0–0.9)

## 2021-08-29 NOTE — Progress Notes (Signed)
Labs are stable. Continue currents medications.

## 2021-09-01 ENCOUNTER — Telehealth: Payer: Self-pay | Admitting: Nurse Practitioner

## 2021-09-01 ENCOUNTER — Telehealth: Payer: Self-pay | Admitting: Cardiovascular Disease

## 2021-09-01 ENCOUNTER — Other Ambulatory Visit: Payer: Self-pay

## 2021-09-01 ENCOUNTER — Telehealth: Payer: Self-pay

## 2021-09-01 DIAGNOSIS — I10 Essential (primary) hypertension: Secondary | ICD-10-CM

## 2021-09-01 MED ORDER — SPIRONOLACTONE-HCTZ 25-25 MG PO TABS: 1.0000 | ORAL_TABLET | Freq: Every day | ORAL | 1 refills | Status: DC

## 2021-09-01 NOTE — Telephone Encounter (Signed)
°*  STAT* If patient is at the pharmacy, call can be transferred to refill team.   1. Which medications need to be refilled? (please list name of each medication and dose if known) spironolactone-hydrochlorothiazide (ALDACTAZIDE) 25-25 MG tablet  2. Which pharmacy/location (including street and city if local pharmacy) is medication to be sent to? [facility], [place] [address]  3. Do they need a 30 day or 90 day supply? 90 day  Patient is out of medication

## 2021-09-01 NOTE — Telephone Encounter (Signed)
Pt needs refill on  spironolactone-hydrochlorothiazide (ALDACTAZIDE) 25-25 MG tablet   Pt is completely out of med    [facility] [employer]

## 2021-09-01 NOTE — Telephone Encounter (Signed)
Called patient to advise refill sent to pharmacy. °

## 2021-09-01 NOTE — Telephone Encounter (Signed)
Called pt advised that this medication was prescribed by his cardiologist and he will have to contact them pt verbalized understanding

## 2021-09-02 ENCOUNTER — Encounter: Payer: Self-pay | Admitting: Podiatry

## 2021-09-02 ENCOUNTER — Ambulatory Visit: Payer: Medicare HMO | Admitting: Podiatry

## 2021-09-02 ENCOUNTER — Other Ambulatory Visit: Payer: Self-pay

## 2021-09-02 DIAGNOSIS — M2011 Hallux valgus (acquired), right foot: Secondary | ICD-10-CM

## 2021-09-02 DIAGNOSIS — B353 Tinea pedis: Secondary | ICD-10-CM | POA: Diagnosis not present

## 2021-09-02 DIAGNOSIS — M79675 Pain in left toe(s): Secondary | ICD-10-CM | POA: Diagnosis not present

## 2021-09-02 DIAGNOSIS — M79674 Pain in right toe(s): Secondary | ICD-10-CM

## 2021-09-02 DIAGNOSIS — E119 Type 2 diabetes mellitus without complications: Secondary | ICD-10-CM | POA: Diagnosis not present

## 2021-09-02 DIAGNOSIS — E785 Hyperlipidemia, unspecified: Secondary | ICD-10-CM | POA: Diagnosis not present

## 2021-09-02 DIAGNOSIS — B351 Tinea unguium: Secondary | ICD-10-CM | POA: Diagnosis not present

## 2021-09-02 DIAGNOSIS — M2012 Hallux valgus (acquired), left foot: Secondary | ICD-10-CM

## 2021-09-02 DIAGNOSIS — E1169 Type 2 diabetes mellitus with other specified complication: Secondary | ICD-10-CM

## 2021-09-02 NOTE — Patient Instructions (Signed)
Spray Lotrimin Spray Powder between toes once daily. Dry well between toes after shower.    Athlete's Foot Athlete's foot (tinea pedis) is a fungal infection of the skin on your feet. It often occurs on the skin that is between or underneath the toes. It can also occur on the soles of your feet. The infection can spread from person to person (is contagious). It can also spread when a person's bare feet come in contact with the fungus on shower floors or on items such as shoes. What are the causes? This condition is caused by a fungus that grows in warm, moist places. You can get athlete's foot by sharing shoes, shower stalls, towels, and wet floors with someone who is infected. Not washing your feet or changing your socks often enough can also lead to athlete's foot. What increases the risk? This condition is more likely to develop in: Men. People who have a weak body defense system (immune system). People who have diabetes. People who use public showers, such as at a gym. People who wear heavy-duty shoes, such as Environmental manager. Seasons with warm, humid weather. What are the signs or symptoms? Symptoms of this condition include: Itchy areas between your toes or on the soles of your feet. White, flaky, or scaly areas between your toes or on the soles of your feet. Very itchy small blisters between your toes or on the soles of your feet. Small cuts in your skin. These cuts can become infected. Thick or discolored toenails. How is this diagnosed? This condition may be diagnosed with a physical exam and a review of your medical history. Your health care provider may also take a skin or toenail sample to examine under a microscope. How is this treated? This condition is treated with antifungal medicines. These may be applied as powders, ointments, or creams. In severe cases, an oral antifungal medicine may be given. Follow these instructions at home: Medicines Apply or take  over-the-counter and prescription medicines only as told by your health care provider. Apply your antifungal medicine as told by your health care provider. Do not stop using the antifungal even if your condition improves. Foot care Do not scratch your feet. Keep your feet dry: Wear cotton or wool socks. Change your socks every day or if they become wet. Wear shoes that allow air to flow, such as sandals or canvas tennis shoes. Wash and dry your feet, including the area between your toes. Also, wash and dry your feet: Every day or as told by your health care provider. After exercising. General instructions Do not let others use towels, shoes, nail clippers, or other personal items that touch your feet. Protect your feet by wearing sandals in wet areas, such as locker rooms and shared showers. Keep all follow-up visits. This is important. If you have diabetes, keep your blood sugar under control. Contact a health care provider if: You have a fever. You have swelling, soreness, warmth, or redness in your foot. Your feet are not getting better with treatment. Your symptoms get worse. You have new symptoms. You have severe pain. Summary Athlete's foot (tinea pedis) is a fungal infection of the skin on your feet. It often occurs on skin that is between or underneath the toes. This condition is caused by a fungus that grows in warm, moist places. Symptoms include white, flaky, or scaly areas between your toes or on the soles of your feet. This condition is treated with antifungal medicines. Keep your feet  clean. Always dry them thoroughly. This information is not intended to replace advice given to you by your health care provider. Make sure you discuss any questions you have with your health care provider. Document Revised: 10/27/2020 Document Reviewed: 10/27/2020 Elsevier Patient Education  2022 [place].   Diabetes Mellitus and [place] care is an important part of your  health, especially when you have diabetes. Diabetes may cause you to have problems because of poor blood flow (circulation) to your feet and legs, which can cause your skin to: Become thinner and drier. Break more easily. Heal more slowly. Peel and crack. You may also have nerve damage (neuropathy) in your legs and feet, causing decreased feeling in them. This means that you may not notice minor injuries to your feet that could lead to more serious problems. Noticing and addressing any potential problems early is the best way to prevent future foot problems. How to care for your feet Foot hygiene  Wash your feet daily with warm water and mild soap. Do not use hot water. Then, pat your feet and the areas between your toes until they are completely dry. Do not soak your feet as this can dry your skin. Trim your toenails straight across. Do not dig under them or around the cuticle. File the edges of your nails with an emery board or nail file. Apply a moisturizing lotion or petroleum jelly to the skin on your feet and to dry, brittle toenails. Use lotion that does not contain alcohol and is unscented. Do not apply lotion between your toes. Shoes and socks Wear clean socks or stockings every day. Make sure they are not too tight. Do not wear knee-high stockings since they may decrease blood flow to your legs. Wear shoes that fit properly and have enough cushioning. Always look in your shoes before you put them on to be sure there are no objects inside. To break in new shoes, wear them for just a few hours a day. This prevents injuries on your feet. Wounds, scrapes, corns, and calluses  Check your feet daily for blisters, cuts, bruises, sores, and redness. If you cannot see the bottom of your feet, use a mirror or ask someone for help. Do not cut corns or calluses or try to remove them with medicine. If you find a minor scrape, cut, or break in the skin on your feet, keep it and the skin around it  clean and dry. You may clean these areas with mild soap and water. Do not clean the area with peroxide, alcohol, or iodine. If you have a wound, scrape, corn, or callus on your foot, look at it several times a day to make sure it is healing and not infected. Check for: Redness, swelling, or pain. Fluid or blood. Warmth. Pus or a bad smell. General tips Do not cross your legs. This may decrease blood flow to your feet. Do not use heating pads or hot water bottles on your feet. They may burn your skin. If you have lost feeling in your feet or legs, you may not know this is happening until it is too late. Protect your feet from hot and cold by wearing shoes, such as at the beach or on hot pavement. Schedule a complete foot exam at least once a year (annually) or more often if you have foot problems. Report any cuts, sores, or bruises to your health care provider immediately. Where to find more information American Diabetes Association: www.diabetes.org Association of Diabetes  Care & Education Specialists: www.diabeteseducator.org Contact a health care provider if: You have a medical condition that increases your risk of infection and you have any cuts, sores, or bruises on your feet. You have an injury that is not healing. You have redness on your legs or feet. You feel burning or tingling in your legs or feet. You have pain or cramps in your legs and feet. Your legs or feet are numb. Your feet always feel cold. You have pain around any toenails. Get help right away if: You have a wound, scrape, corn, or callus on your foot and: You have pain, swelling, or redness that gets worse. You have fluid or blood coming from the wound, scrape, corn, or callus. Your wound, scrape, corn, or callus feels warm to the touch. You have pus or a bad smell coming from the wound, scrape, corn, or callus. You have a fever. You have a red line going up your leg. Summary Check your feet every day for  blisters, cuts, bruises, sores, and redness. Apply a moisturizing lotion or petroleum jelly to the skin on your feet and to dry, brittle toenails. Wear shoes that fit properly and have enough cushioning. If you have foot problems, report any cuts, sores, or bruises to your health care provider immediately. Schedule a complete foot exam at least once a year (annually) or more often if you have foot problems. This information is not intended to replace advice given to you by your health care provider. Make sure you discuss any questions you have with your health care provider. Document Revised: 01/25/2020 Document Reviewed: 01/25/2020 Elsevier Patient Education  [place].

## 2021-09-04 ENCOUNTER — Other Ambulatory Visit: Payer: Self-pay | Admitting: Internal Medicine

## 2021-09-04 DIAGNOSIS — M48062 Spinal stenosis, lumbar region with neurogenic claudication: Secondary | ICD-10-CM

## 2021-09-07 NOTE — Progress Notes (Signed)
Subjective: Douglas Keller presents today referred by Renee Rival, FNP for diabetic foot evaluation.  Today, patient c/o for diabetic foot evaluation.  Patient denies any history of foot wounds.  Patient has been diagnosed with neuropathy and it is managed with gabapentin .  Patient does not routinely mointor blood glucose.  PCP is Renee Rival, FNP , and last visit was August 28, 2021.  Past Medical History:  Diagnosis Date   Aortic valve sclerosis    Atherosclerosis of coronary artery without angina pectoris 07/05/2014   Cervical spondylosis without myelopathy 02/13/2013   Chest pain 01/29/2019   Degeneration of cervical intervertebral disc 02/13/2013   Depression    sometimes   Diabetes mellitus type 2, diet-controlled (HCC)    Elevated troponin    GERD (gastroesophageal reflux disease)    Heart murmur    HTN (hypertension) 07/06/2013   Hypercholesterolemia 08/21/2015   Hyperlipidemia 07/06/2013   Medication management 08/21/2015   Pre-diabetes    Rectal bleeding 09/21/2019   Stiffness of left shoulder joint 07/19/2013    Patient Active Problem List   Diagnosis Date Noted   Right knee pain 08/28/2021   AKI (acute kidney injury) ([place]) 08/28/2021   Impacted ear wax 08/28/2021   Encounter for diabetic foot exam ([place]) 08/28/2021   Left knee pain 10/30/2020   Decreased GFR 07/31/2020   Spinal stenosis of lumbar region with neurogenic claudication 12/27/2019   Spinal stenosis of lumbar region 11/29/2019   Neck and shoulder pain 09/05/2019   Other insomnia 08/22/2019   Dizzy spells 08/01/2019   Depression, major, single episode, moderate ([place]) 07/07/2019   Obesity (BMI 30.0-34.9) 06/21/2019   Gastroesophageal reflux disease    Type 2 diabetes mellitus with hyperlipidemia ([place]) 09/01/2018   Heterozygous familial hypercholesterolemia 09/01/2018   Chronic low back pain 11/27/2015   Atherosclerosis of coronary artery without angina pectoris 07/05/2014    Hyperlipidemia 07/06/2013   HTN (hypertension) 07/06/2013   Spinal stenosis in cervical region 02/13/2013    Past Surgical History:  Procedure Laterality Date   BIOPSY  12/14/2019   Procedure: BIOPSY;  Surgeon: Daneil Dolin, MD;  [facility];  Service: Endoscopy;;   CERVICAL SPINE SURGERY     COLONOSCOPY  07/26/2012   Procedure: COLONOSCOPY;  Surgeon: Jamesetta So, MD;  [facility];  Service: Gastroenterology;  Laterality: N/A;   COLONOSCOPY WITH PROPOFOL N/A 12/14/2019   Procedure: COLONOSCOPY WITH PROPOFOL;  Surgeon: Daneil Dolin, MD;  [facility];  Service: Endoscopy;  Laterality: N/A;  9:00am   KNEE ARTHROSCOPY Left    torn ligament   LUMBAR LAMINECTOMY/DECOMPRESSION MICRODISCECTOMY Bilateral 12/27/2019   Procedure: BIATERAL LUMBAR TWO- LUMBAR THREE, LUMBAR THREE- LUMBAR FOUR, LUMBAR FOUR-FIVE LAMINOTOMY/FORAMINOTOMY 3 LEVELS;  Surgeon: Newman Pies, MD;  Location: [facility];  Service: Neurosurgery;  Laterality: Bilateral;  POSTERIOR   PVC ABLATION N/A 04/18/2021   Procedure: PVC ABLATION;  Surgeon: Vickie Epley, MD;  Location: [facility];  Service: Cardiovascular;  Laterality: N/A;   SPINE SURGERY N/A    Phreesia 02/24/2020    Current Outpatient Medications on File Prior to Visit  Medication Sig Dispense Refill   acetaminophen (TYLENOL) 650 MG CR tablet Take 1,300 mg by mouth 2 (two) times daily. (Patient not taking: Reported on 08/27/2021)     amiodarone (PACERONE) 200 MG tablet Take 1 tablet (200 mg total) by mouth 2 (two) times daily for 7 days, THEN 1 tablet (200 mg total) daily. 97 tablet 3  amLODipine (NORVASC) 5 MG tablet Take 1 tablet (5 mg total) by mouth daily. 90 tablet 1   aspirin EC 81 MG tablet Take 81 mg by mouth daily.     benazepril (LOTENSIN) 40 MG tablet Take 1 tablet (40 mg total) by mouth daily. 90 tablet 1   blood glucose meter kit and supplies Dispense based on patient and insurance preference. Use up to four  times daily as directed. (FOR ICD-10 E10.9, E11.9). 1 each 0   buPROPion (WELLBUTRIN XL) 150 MG 24 hr tablet Take 1 tablet (150 mg total) by mouth daily. 90 tablet 2   Evolocumab (REPATHA SURECLICK) 220 MG/ML SOAJ INJECT 140 MG INTO THE SKIN EVERY 14 DAY 2 mL 11   glipiZIDE (GLIPIZIDE XL) 2.5 MG 24 hr tablet Take 1 tablet (2.5 mg total) by mouth daily with breakfast. 90 tablet 1   magnesium oxide (MAG-OX) 400 MG tablet Take 400 mg by mouth daily. (Patient not taking: Reported on 08/27/2021)     meclizine (ANTIVERT) 25 MG tablet Take 1 tablet (25 mg total) by mouth 3 (three) times daily as needed for dizziness. 30 tablet 0   meloxicam (MOBIC) 7.5 MG tablet Take 1 tablet (7.5 mg total) by mouth daily. 90 tablet 2   metoprolol succinate (TOPROL-XL) 25 MG 24 hr tablet TAKE 1 TABLET DAILY. TAKE WITH OR IMMEDIATELY FOLLOWING A MEAL. 90 tablet 3   multivitamin-iron-minerals-folic acid (CENTRUM) chewable tablet Chew 1 tablet by mouth daily.     OVER THE COUNTER MEDICATION Apple cider vinegar and Ginger Gummy - take 1 gummy by mouth once daily     pantoprazole (PROTONIX) 40 MG tablet Take 1 tablet (40 mg total) by mouth daily. 90 tablet 2   spironolactone-hydrochlorothiazide (ALDACTAZIDE) 25-25 MG tablet Take 1 tablet by mouth daily. 90 tablet 1   No current facility-administered medications on file prior to visit.     Allergies  Allergen Reactions   Metformin And Related Diarrhea and Nausea And Vomiting   Statins Other (See Comments)    Muscle/joint pain     Social History   Occupational History    Comment: Retired   Tobacco Use   Smoking status: Never    Passive exposure: Never   Smokeless tobacco: Never  Vaping Use   Vaping Use: Never used  Substance and Sexual Activity   Alcohol use: No   Drug use: No   Sexual activity: Not Currently    Family History  Problem Relation Age of Onset   Asthma Mother    Hypertension Father    Colon cancer Neg Hx     Immunization History   Administered Date(s) Administered   Fluad Quad(high Dose 65+) 03/20/2019, 05/03/2020   Influenza-Unspecified 04/19/2014, 05/20/2021   Moderna Sars-Covid-2 Vaccination 08/11/2019, 09/11/2019, 05/15/2020   Pneumococcal Polysaccharide-23 11/29/2019   Zoster Recombinat (Shingrix) 02/03/2019    Objective: There were no vitals filed for this visit.  Douglas Keller is a pleasant 81 y.o. male WD, WN in NAD. AAO X 3.  Vascular Examination: CFT immediate b/l LE. Palpable DP/PT pulses b/l LE. Digital hair present b/l. Skin temperature gradient WNL b/l. No pain with calf compression b/l. No edema noted b/l. No cyanosis or clubbing noted b/l LE.  Dermatological Examination: Pedal integument with normal turgor, texture and tone BLE. Interdigital maceration noted bilateral1st-4th webspace(s). No blistering, no weeping, no open wounds. Toenails 1-5 b/l elongated, discolored, dystrophic, thickened, crumbly with subungual debris and tenderness to dorsal palpation. No hyperkeratotic nor porokeratotic lesions present on today's  visit.  Musculoskeletal Examination: Normal muscle strength 5/5 to all lower extremity muscle groups bilaterally. HAV with bunion deformity noted b/l LE.Marland Kitchen No pain, crepitus or joint limitation noted with ROM b/l LE.  Patient ambulates independently without assistive aids.  Footwear Assessment: Does the patient wear appropriate shoes? Yes. Does the patient need inserts/orthotics? No.  Neurological Examination: Protective sensation decreased with 10 gram monofilament b/l. Vibratory sensation intact b/l.  A1c:   Hemoglobin A1C Latest Ref Rng & Units 08/28/2021 02/25/2021 10/31/2020  HGBA1C 4.8 - 5.6 % 6.2(H) 6.6(H) 6.4(H)  Some recent data might be hidden   Assessment: 1. Pain due to onychomycosis of toenails of both feet   2. Tinea pedis of both feet   3. Hallux valgus, acquired, bilateral   4. Type 2 diabetes mellitus with hyperlipidemia ([place])   5. Encounter for diabetic foot  exam ([place])     ADA Risk Categorization: Low Risk:  Patient has all of the following: Intact protective sensation No prior foot ulcer  No severe deformity Pedal pulses present  Plan: -Diabetic foot examination performed today. -Discussed diabetic foot care principles. Literature dispensed on today. -Mycotic toenails 1-5 bilaterally were debrided in length and girth with sterile nail nippers and dremel without incident. -Patient instructed to apply Lotrimin Spray Powder between toes once daily for interdigital tinea pedis. Follow up one month for re-evaluation of tinea pedis. -Patient/POA to call should there be question/concern in the interim.  Return in about 3 months (around 11/30/2021).  Marzetta Board, DPM

## 2021-09-10 DIAGNOSIS — I493 Ventricular premature depolarization: Secondary | ICD-10-CM | POA: Diagnosis not present

## 2021-09-11 ENCOUNTER — Other Ambulatory Visit: Payer: Medicare HMO

## 2021-09-11 ENCOUNTER — Other Ambulatory Visit: Payer: Self-pay

## 2021-09-11 DIAGNOSIS — Z79899 Other long term (current) drug therapy: Secondary | ICD-10-CM | POA: Diagnosis not present

## 2021-09-11 LAB — BASIC METABOLIC PANEL
BUN/Creatinine Ratio: 8 — ABNORMAL LOW (ref 10–24)
BUN: 12 mg/dL (ref 8–27)
CO2: 25 mmol/L (ref 20–29)
Calcium: 8.6 mg/dL (ref 8.6–10.2)
Chloride: 105 mmol/L (ref 96–106)
Creatinine, Ser: 1.49 mg/dL — ABNORMAL HIGH (ref 0.76–1.27)
Glucose: 94 mg/dL (ref 70–99)
Potassium: 4.3 mmol/L (ref 3.5–5.2)
Sodium: 142 mmol/L (ref 134–144)
eGFR: 47 mL/min/{1.73_m2} — ABNORMAL LOW (ref >59)

## 2021-09-12 ENCOUNTER — Telehealth: Payer: Self-pay | Admitting: *Deleted

## 2021-09-12 ENCOUNTER — Other Ambulatory Visit: Payer: Self-pay | Admitting: *Deleted

## 2021-09-12 DIAGNOSIS — I10 Essential (primary) hypertension: Secondary | ICD-10-CM

## 2021-09-12 DIAGNOSIS — Z79899 Other long term (current) drug therapy: Secondary | ICD-10-CM

## 2021-09-12 MED ORDER — BENAZEPRIL HCL 20 MG PO TABS: 20.0000 mg | ORAL_TABLET | Freq: Every day | ORAL | 1 refills | Status: DC

## 2021-09-12 NOTE — Telephone Encounter (Signed)
poke with patient about results with recommendations and verbalized understanding

## 2021-09-15 ENCOUNTER — Telehealth: Payer: Self-pay

## 2021-09-15 MED ORDER — REPATHA SURECLICK 140 MG/ML ~~LOC~~ SOAJ: SUBCUTANEOUS | 3 refills | Status: DC

## 2021-09-15 NOTE — Addendum Note (Signed)
Addended by: Allean Found on: 09/15/2021 08:36 AM   Modules accepted: Orders

## 2021-09-15 NOTE — Telephone Encounter (Signed)
Please call @ [phone].  Thank you

## 2021-09-15 NOTE — Telephone Encounter (Signed)
Called and spoke w/pt and stated that we would get him a grant and call it into [place]. Pt voiced understanding [place] grant: Medicare access grant-card number:[id number], [id number], CBS:[id number], [id number], HELP DESK:[phone]

## 2021-09-16 ENCOUNTER — Ambulatory Visit: Payer: Medicare HMO

## 2021-09-16 ENCOUNTER — Other Ambulatory Visit: Payer: Self-pay

## 2021-09-16 DIAGNOSIS — Z0100 Encounter for examination of eyes and vision without abnormal findings: Secondary | ICD-10-CM | POA: Diagnosis not present

## 2021-09-16 DIAGNOSIS — H5210 Myopia, unspecified eye: Secondary | ICD-10-CM | POA: Diagnosis not present

## 2021-09-16 LAB — HM DIABETES EYE EXAM

## 2021-09-17 ENCOUNTER — Telehealth: Payer: Self-pay

## 2021-09-17 NOTE — Telephone Encounter (Signed)
Please call at [phone].  Thank you ?

## 2021-09-18 MED ORDER — REPATHA SURECLICK 140 MG/ML ~~LOC~~ SOAJ: SUBCUTANEOUS | 3 refills | Status: DC

## 2021-09-18 NOTE — Addendum Note (Signed)
Addended by: Allean Found on: 09/18/2021 08:41 AM ? ? Modules accepted: Orders ? ?

## 2021-09-18 NOTE — Telephone Encounter (Signed)
Called and spoke w/pt and stated that their medication was gonna be charged an amount at the [employer] so we 3 way called them and they stated that they do not accept any form of copay card and we decided to send to [employer] and the pt voiced understanding.  ?

## 2021-10-02 ENCOUNTER — Other Ambulatory Visit: Payer: Self-pay

## 2021-10-02 ENCOUNTER — Ambulatory Visit: Payer: Medicare HMO | Admitting: Podiatry

## 2021-10-02 DIAGNOSIS — B353 Tinea pedis: Secondary | ICD-10-CM | POA: Diagnosis not present

## 2021-10-02 MED ORDER — TERBINAFINE HCL 250 MG PO TABS: 250.0000 mg | ORAL_TABLET | Freq: Every day | ORAL | 0 refills | Status: DC

## 2021-10-06 ENCOUNTER — Encounter: Payer: Self-pay | Admitting: Podiatry

## 2021-10-06 NOTE — Progress Notes (Signed)
?  Subjective:  ?[name], male    DOB: 1940-09-18,  MRN: [id number] ? ?Chief Complaint  ?Patient presents with  ? Tinea Pedis  ?   interdigital tinea pedis  ? ? ?81 y.o. male presents with the above complaint. History confirmed with patient.  He was referred to me by Dr. Elisha Ponder he has been using Tinactin spray and powder ? ?Objective:  ?Physical Exam: ?warm, good capillary refill, no trophic changes or ulcerative lesions, normal DP and PT pulses, normal sensory exam, and tinea pedis. ? ?Assessment:  ? ?1. Tinea pedis of both feet   ? ? ? ?Plan:  ?Patient was evaluated and treated and all questions answered. ? ?Discussed the etiology and treatment options for tinea pedis.  Discussed topical and oral treatment.  Recommended oral treatment with 4 weeks Lamisil.  This was sent to the patient's pharmacy.  Also discussed appropriate foot hygiene, use of antifungal spray such as Tinactin in shoes, as well as cleaning foot surfaces such as showers and bathroom floors with bleach. ? ? ?Return if symptoms worsen or fail to improve.  ? ?

## 2021-10-10 ENCOUNTER — Other Ambulatory Visit: Payer: Medicare HMO | Admitting: *Deleted

## 2021-10-10 ENCOUNTER — Other Ambulatory Visit: Payer: Self-pay

## 2021-10-10 DIAGNOSIS — Z79899 Other long term (current) drug therapy: Secondary | ICD-10-CM | POA: Diagnosis not present

## 2021-10-10 LAB — BASIC METABOLIC PANEL
BUN/Creatinine Ratio: 10 (ref 10–24)
BUN: 14 mg/dL (ref 8–27)
CO2: 23 mmol/L (ref 20–29)
Calcium: 8.9 mg/dL (ref 8.6–10.2)
Chloride: 103 mmol/L (ref 96–106)
Creatinine, Ser: 1.36 mg/dL — ABNORMAL HIGH (ref 0.76–1.27)
Glucose: 156 mg/dL — ABNORMAL HIGH (ref 70–99)
Potassium: 4 mmol/L (ref 3.5–5.2)
Sodium: 141 mmol/L (ref 134–144)
eGFR: 53 mL/min/{1.73_m2} — ABNORMAL LOW (ref >59)

## 2021-10-13 ENCOUNTER — Telehealth: Payer: Self-pay | Admitting: *Deleted

## 2021-10-13 NOTE — Telephone Encounter (Signed)
-----   Message from [place], [place] sent at 10/10/2021  5:43 PM EDT ----- ?Better, no further changes at this time ?

## 2021-10-13 NOTE — Telephone Encounter (Signed)
Lvm of lab results on phone and clinic to call back if need to call for any questions. ?

## 2021-10-14 ENCOUNTER — Telehealth: Payer: Self-pay | Admitting: *Deleted

## 2021-10-14 MED ORDER — FLUCONAZOLE 150 MG PO TABS: 150.0000 mg | ORAL_TABLET | ORAL | 0 refills | Status: DC

## 2021-10-14 NOTE — Telephone Encounter (Signed)
Patient notified

## 2021-10-14 NOTE — Telephone Encounter (Signed)
Patient is calling and requesting a cream or salve, terbinafine tablet is making him sick. Please advise. ?

## 2021-10-22 NOTE — Progress Notes (Signed)
Normal eye exam, repeat in 1 year

## 2021-11-25 ENCOUNTER — Ambulatory Visit (INDEPENDENT_AMBULATORY_CARE_PROVIDER_SITE_OTHER): Payer: Medicare HMO | Admitting: Nurse Practitioner

## 2021-11-25 ENCOUNTER — Encounter: Payer: Self-pay | Admitting: Nurse Practitioner

## 2021-11-25 VITALS — BP 127/62 | HR 57 | Ht 66.0 in | Wt 209.0 lb

## 2021-11-25 DIAGNOSIS — E1169 Type 2 diabetes mellitus with other specified complication: Secondary | ICD-10-CM

## 2021-11-25 DIAGNOSIS — E7800 Pure hypercholesterolemia, unspecified: Secondary | ICD-10-CM | POA: Diagnosis not present

## 2021-11-25 DIAGNOSIS — M542 Cervicalgia: Secondary | ICD-10-CM | POA: Diagnosis not present

## 2021-11-25 DIAGNOSIS — M25519 Pain in unspecified shoulder: Secondary | ICD-10-CM | POA: Diagnosis not present

## 2021-11-25 DIAGNOSIS — M48062 Spinal stenosis, lumbar region with neurogenic claudication: Secondary | ICD-10-CM

## 2021-11-25 DIAGNOSIS — E785 Hyperlipidemia, unspecified: Secondary | ICD-10-CM

## 2021-11-25 DIAGNOSIS — I10 Essential (primary) hypertension: Secondary | ICD-10-CM

## 2021-11-25 MED ORDER — GABAPENTIN 300 MG PO CAPS: ORAL_CAPSULE | ORAL | 0 refills | Status: DC

## 2021-11-25 NOTE — Progress Notes (Signed)
? ?  Douglas Keller     MRN: [id number]      DOB: August 31, 1940 ? ? ?HPI ?Douglas Keller with past medical history of hypertension, type 2 diabetes, hyperlipidemia, spinal stenosis of lumbar region with neurogenic claudication, bilateral knee  arthritis, is here for follow up and re-evaluation of chronic medical conditions, medication management and review of any available recent lab and radiology data.  ? ? ? ?Need handicap placard, has been having trouble walking due to his previous back surgery, both knee arthritis. Douglas Keller 339m TID Is helping his pain  ? ? ?Due for pneumonia vaccine and Tdap vaccine need for both vaccines discussed with patient patient encouraged to get vaccines at his pharmacy he verbalized understanding.  ? ? ?ROS ?Denies recent fever or chills. ?Denies sinus pressure, nasal congestion, ear pain or sore throat. ?Denies chest congestion, productive cough or wheezing. ?Denies chest pains, palpitations and leg swelling ?Denies abdominal pain, nausea, vomiting,diarrhea or constipation.   ?Denies dysuria, frequency, hesitancy or incontinence. ?Denies joint pain, swelling and limitation in mobility. ?Denies headaches, seizures, numbness, or tingling. ?Denies depression, anxiety or insomnia. ? ? ? ?PE ? ?BP 127/62 (BP Location: Right Arm, Patient Position: Sitting, Cuff Size: Large)   Pulse (!) 57   Ht '5\' 6"'$  (1.676 m)   Wt 209 lb (94.8 kg)   SpO2 96%   BMI 33.73 kg/m?  ? ?Patient alert and oriented and in no cardiopulmonary distress. ? ?Chest: Clear to auscultation bilaterally. ? ?CVS: S1, S2 no murmurs, no S3.Regular rate. ? ?ABD: Soft non tender.  ? ?Ext: No edema ? ?MS: Adequate ROM spine, shoulders, hips and knees. ?Psych: Good eye contact, normal affect. Memory intact not anxious or depressed appearing. ? ? ? ? ?Assessment & Plan ? ?HTN (hypertension) ?BP Readings from Last 3 Encounters:  ?11/25/21 127/62  ?08/28/21 131/63  ?08/27/21 108/68  ?Chronic condition well-controlled on amlodipine 5 mg  daily, amiodarone 200 mg  daily, spironolactone-hydrochlorothiazide 25-25 mg daily, metoprolol 25 mg daily, benazepril 20 mg daily. ?Continue current medication, appreciate collaboration with cardiology ?DASH diet advised engage in regular exercises at least 150 minutes weekly. ?CMP ordered ? ?Type 2 diabetes mellitus with hyperlipidemia (Douglas Keller ?Lab Results  ?Component Value Date  ? HGBA1C 6.2 (H) 08/28/2021  ? ?Chronic condition well-controlled on glipizide 2.5 mg daily ?Avoid sugar sweets soda engage in daily exercises at least for 50 minutes weekly ?Up-to-date with diabetic eye exam, foot exam ?Urine creatinine labs ordered, A1c today ? ?Hyperlipidemia ?On Repatha 140 mg every 2 weeks injection, ?Check lipid panel ?Lab Results  ?Component Value Date  ? CHOL 120 08/28/2021  ? HDL 42 08/28/2021  ? LDLCALC 54 08/28/2021  ? TRIG 141 08/28/2021  ? CHOLHDL 2.9 08/28/2021  ? ? ?Spinal stenosis of lumbar region with neurogenic claudication ?Well-controlled on gabapentin 300 mg 3 times daily ?Medication refilled today ?Handicap sticker applied for today.  ?

## 2021-11-25 NOTE — Patient Instructions (Signed)
Pneumonia and TDAP vaccine at your pharmacy ? ? ?It is important that you exercise regularly at least 30 minutes 5 times a week.  ?Think about what you will eat, plan ahead. ?Choose " clean, green, fresh or frozen" over canned, processed or packaged foods which are more sugary, salty and fatty. ?70 to 75% of food eaten should be vegetables and fruit. ?Three meals at set times with snacks allowed between meals, but they must be fruit or vegetables. ?Aim to eat over a 12 hour period , example 7 am to 7 pm, and STOP after  your last meal of the day. ?Drink water,generally about 64 ounces per day, no other drink is as healthy. Fruit juice is best enjoyed in a healthy way, by EATING the fruit. ? ?Thanks for choosing  [facility], we consider it a privelige to serve you. ? ?

## 2021-11-25 NOTE — Assessment & Plan Note (Addendum)
BP Readings from Last 3 Encounters:  ?11/25/21 127/62  ?08/28/21 131/63  ?08/27/21 108/68  ?Chronic condition well-controlled on amlodipine 5 mg daily, amiodarone 200 mg  daily, spironolactone-hydrochlorothiazide 25-25 mg daily, metoprolol 25 mg daily, benazepril 20 mg daily. ?Continue current medication, appreciate collaboration with cardiology ?DASH diet advised engage in regular exercises at least 150 minutes weekly. ?CMP ordered ?

## 2021-11-25 NOTE — Assessment & Plan Note (Addendum)
Lab Results  ?Component Value Date  ? HGBA1C 6.2 (H) 08/28/2021  ? ?Chronic condition well-controlled on glipizide 2.5 mg daily ?Avoid sugar sweets soda engage in daily exercises at least for 50 minutes weekly ?Up-to-date with diabetic eye exam, foot exam ?Urine creatinine labs ordered, A1c today ?

## 2021-11-25 NOTE — Assessment & Plan Note (Signed)
On Repatha 140 mg every 2 weeks injection, ?Check lipid panel ?Lab Results  ?Component Value Date  ? CHOL 120 08/28/2021  ? HDL 42 08/28/2021  ? LDLCALC 54 08/28/2021  ? TRIG 141 08/28/2021  ? CHOLHDL 2.9 08/28/2021  ? ?

## 2021-11-25 NOTE — Assessment & Plan Note (Signed)
Well-controlled on gabapentin 300 mg 3 times daily ?Medication refilled today ?Handicap sticker applied for today. ?

## 2021-11-28 ENCOUNTER — Other Ambulatory Visit: Payer: Self-pay

## 2021-11-28 ENCOUNTER — Telehealth: Payer: Self-pay

## 2021-11-28 DIAGNOSIS — M48062 Spinal stenosis, lumbar region with neurogenic claudication: Secondary | ICD-10-CM

## 2021-11-28 DIAGNOSIS — I10 Essential (primary) hypertension: Secondary | ICD-10-CM | POA: Diagnosis not present

## 2021-11-28 DIAGNOSIS — E785 Hyperlipidemia, unspecified: Secondary | ICD-10-CM | POA: Diagnosis not present

## 2021-11-28 DIAGNOSIS — E1169 Type 2 diabetes mellitus with other specified complication: Secondary | ICD-10-CM | POA: Diagnosis not present

## 2021-11-28 DIAGNOSIS — E7800 Pure hypercholesterolemia, unspecified: Secondary | ICD-10-CM | POA: Diagnosis not present

## 2021-11-28 MED ORDER — GABAPENTIN 300 MG PO CAPS: ORAL_CAPSULE | ORAL | 1 refills | Status: DC

## 2021-11-28 NOTE — Telephone Encounter (Signed)
Rx corrected and sent.

## 2021-11-28 NOTE — Telephone Encounter (Signed)
Patient said needs 270 total pills, take tid. Instead of 90 day supply this is only 30 day supply.  ? ?Pharmacy ? ?[place], [place]  ?[place], [place] [place] [address]  ?Phone:  [phone]  Fax:  [phone]  ?DEA #:  --  ? ?

## 2021-11-29 ENCOUNTER — Other Ambulatory Visit: Payer: Self-pay | Admitting: Nurse Practitioner

## 2021-11-30 LAB — CMP14+EGFR
ALT: 12 IU/L (ref 0–44)
AST: 20 IU/L (ref 0–40)
Albumin/Globulin Ratio: 1.5 (ref 1.2–2.2)
Albumin: 4.1 g/dL (ref 3.7–4.7)
Alkaline Phosphatase: 64 IU/L (ref 44–121)
BUN/Creatinine Ratio: 13 (ref 10–24)
BUN: 20 mg/dL (ref 8–27)
Bilirubin Total: 0.4 mg/dL (ref 0.0–1.2)
CO2: 20 mmol/L (ref 20–29)
Calcium: 8.7 mg/dL (ref 8.6–10.2)
Chloride: 106 mmol/L (ref 96–106)
Creatinine, Ser: 1.5 mg/dL — ABNORMAL HIGH (ref 0.76–1.27)
Globulin, Total: 2.7 g/dL (ref 1.5–4.5)
Glucose: 120 mg/dL — ABNORMAL HIGH (ref 70–99)
Potassium: 4.3 mmol/L (ref 3.5–5.2)
Sodium: 140 mmol/L (ref 134–144)
Total Protein: 6.8 g/dL (ref 6.0–8.5)
eGFR: 47 mL/min/{1.73_m2} — ABNORMAL LOW (ref >59)

## 2021-11-30 LAB — LIPID PANEL
Chol/HDL Ratio: 2.6 ratio (ref 0.0–5.0)
Cholesterol, Total: 113 mg/dL (ref 100–199)
HDL: 43 mg/dL (ref >39)
LDL Chol Calc (NIH): 52 mg/dL (ref 0–99)
Triglycerides: 95 mg/dL (ref 0–149)
VLDL Cholesterol Cal: 18 mg/dL (ref 5–40)

## 2021-11-30 LAB — HEMOGLOBIN A1C
Est. average glucose Bld gHb Est-mCnc: 131 mg/dL
Hgb A1c MFr Bld: 6.2 % — ABNORMAL HIGH (ref 4.8–5.6)

## 2021-11-30 LAB — MICROALBUMIN / CREATININE URINE RATIO
Creatinine, Urine: 197.5 mg/dL
Microalb/Creat Ratio: 3 mg/g creat (ref 0–29)
Microalbumin, Urine: 6.9 ug/mL

## 2021-12-01 ENCOUNTER — Other Ambulatory Visit: Payer: Self-pay | Admitting: *Deleted

## 2021-12-01 DIAGNOSIS — Z79899 Other long term (current) drug therapy: Secondary | ICD-10-CM

## 2021-12-01 DIAGNOSIS — I10 Essential (primary) hypertension: Secondary | ICD-10-CM

## 2021-12-01 MED ORDER — BENAZEPRIL HCL 20 MG PO TABS: 10.0000 mg | ORAL_TABLET | Freq: Every day | ORAL | 1 refills | Status: DC

## 2021-12-02 ENCOUNTER — Other Ambulatory Visit: Payer: Self-pay | Admitting: Nurse Practitioner

## 2021-12-02 ENCOUNTER — Ambulatory Visit: Payer: Medicare HMO | Admitting: Podiatry

## 2021-12-02 DIAGNOSIS — D229 Melanocytic nevi, unspecified: Secondary | ICD-10-CM

## 2021-12-02 DIAGNOSIS — M79674 Pain in right toe(s): Secondary | ICD-10-CM

## 2021-12-02 DIAGNOSIS — M79675 Pain in left toe(s): Secondary | ICD-10-CM

## 2021-12-02 DIAGNOSIS — B353 Tinea pedis: Secondary | ICD-10-CM | POA: Diagnosis not present

## 2021-12-02 DIAGNOSIS — E1169 Type 2 diabetes mellitus with other specified complication: Secondary | ICD-10-CM | POA: Diagnosis not present

## 2021-12-02 DIAGNOSIS — E785 Hyperlipidemia, unspecified: Secondary | ICD-10-CM

## 2021-12-02 DIAGNOSIS — B351 Tinea unguium: Secondary | ICD-10-CM | POA: Diagnosis not present

## 2021-12-07 ENCOUNTER — Encounter: Payer: Self-pay | Admitting: Podiatry

## 2021-12-07 NOTE — Progress Notes (Signed)
  Subjective:  [name], male    DOB: [birth year],  MRN: [id number]  Douglas Keller presents to clinic today for preventative diabetic foot care and painful elongated mycotic toenails 1-5 bilaterally which are tender when wearing enclosed shoe gear. Pain is relieved with periodic professional debridement.  Patient did follow up with Dr. Sherryle Lis for tinea pedis.   He states he has issues with his fingernails being thick and discolored.   PCP is Renee Rival, FNP , and last visit was Nov 25, 2021.  Allergies  Allergen Reactions   Metformin And Related Diarrhea and Nausea And Vomiting   Statins Other (See Comments)    Muscle/joint pain     Review of Systems: Negative except as noted in the HPI.  Objective: No changes noted in today's physical examination. There were no vitals filed for this visit.  Douglas Keller is a pleasant 81 y.o. male WD, WN in NAD. AAO X 3.  Vascular Examination: CFT immediate b/l LE. Palpable DP/PT pulses b/l LE. Digital hair present b/l. Skin temperature gradient WNL b/l. No pain with calf compression b/l. No edema noted b/l. No cyanosis or clubbing noted b/l LE.  Dermatological Examination: Pedal integument with normal turgor, texture and tone BLE. Nevus noted 4th webspace left foot. Toenails 1-5 b/l elongated, discolored, dystrophic, thickened, crumbly with subungual debris and tenderness to dorsal palpation. No hyperkeratotic nor porokeratotic lesions present on today's visit.  Musculoskeletal Examination: Normal muscle strength 5/5 to all lower extremity muscle groups bilaterally. HAV with bunion deformity noted b/l LE.Marland Kitchen No pain, crepitus or joint limitation noted with ROM b/l LE.  Patient ambulates independently without assistive aids.  Neurological Examination: Protective sensation decreased with 10 gram monofilament b/l. Vibratory sensation intact b/l.     Latest Ref Rng & Units 11/28/2021    8:11 AM 08/28/2021   11:11 AM  02/25/2021    8:37 AM  Hemoglobin A1C  Hemoglobin-A1c 4.8 - 5.6 % 6.2   6.2   6.6     Assessment/Plan: 1. Pain due to onychomycosis of toenails of both feet   2. Tinea pedis of both feet   3. Suspicious nevus   4. Type 2 diabetes mellitus with hyperlipidemia ([place])     -Patient was evaluated and treated. All patient's and/or POA's questions/concerns answered on today's visit. -Patient instructed to spray shoes with Lysol disinfectant every evening. -Toenails 1-5 b/l were debrided in length and girth with sterile nail nippers and dremel without iatrogenic bleeding.  -Dermatology consultation for evaluation/treatment of left 4th webspace. Patient also advised Dermatology may also assess his toenails. -Patient/POA to call should there be question/concern in the interim.   Return in about 3 months (around 03/04/2022).  Marzetta Board, DPM

## 2021-12-23 ENCOUNTER — Other Ambulatory Visit: Payer: Self-pay | Admitting: *Deleted

## 2021-12-23 DIAGNOSIS — I10 Essential (primary) hypertension: Secondary | ICD-10-CM

## 2021-12-23 DIAGNOSIS — Z79899 Other long term (current) drug therapy: Secondary | ICD-10-CM

## 2021-12-23 MED ORDER — BENAZEPRIL HCL 10 MG PO TABS: 10.0000 mg | ORAL_TABLET | Freq: Every day | ORAL | 1 refills | Status: DC

## 2021-12-29 ENCOUNTER — Ambulatory Visit: Payer: Medicare HMO

## 2021-12-29 ENCOUNTER — Ambulatory Visit: Payer: Medicare HMO | Admitting: Orthopedic Surgery

## 2021-12-29 ENCOUNTER — Ambulatory Visit (INDEPENDENT_AMBULATORY_CARE_PROVIDER_SITE_OTHER): Payer: Medicare HMO

## 2021-12-29 DIAGNOSIS — I4891 Unspecified atrial fibrillation: Secondary | ICD-10-CM | POA: Diagnosis not present

## 2021-12-29 DIAGNOSIS — M170 Bilateral primary osteoarthritis of knee: Secondary | ICD-10-CM

## 2021-12-29 DIAGNOSIS — R0683 Snoring: Secondary | ICD-10-CM | POA: Diagnosis not present

## 2021-12-29 DIAGNOSIS — G478 Other sleep disorders: Secondary | ICD-10-CM | POA: Diagnosis not present

## 2021-12-29 DIAGNOSIS — I10 Essential (primary) hypertension: Secondary | ICD-10-CM | POA: Diagnosis not present

## 2021-12-29 DIAGNOSIS — G4719 Other hypersomnia: Secondary | ICD-10-CM | POA: Diagnosis not present

## 2021-12-29 NOTE — Patient Instructions (Signed)
Lets try taking the medication daily before rec surgery or injections

## 2021-12-29 NOTE — Progress Notes (Signed)
Dg knee   

## 2021-12-29 NOTE — Progress Notes (Signed)
NEW PROBLEM//OFFICE VISIT   Chief Complaint  Patient presents with   Knee Pain    Bilateral, hurting keeps me from playing golf Meloxicam helps but he tries to take sparingly   HPI  81 year old male chronic bilateral knee pain seen in the past started on meloxicam.  He is having trouble golfing.  He says if he takes the medicine he can do anything he wants but he does not want to have issues with his kidneys   ROS: No kidney function issues Labs look normal ROS   There were no vitals taken for this visit.  There is no height or weight on file to calculate BMI.  General appearance: Well-developed well-nourished no gross deformities  Cardiovascular normal pulse and perfusion normal color without edema  Neurologically no sensation loss or deficits or pathologic reflexes  Psychological: Awake alert and oriented x3 mood and affect normal  Skin no lacerations or ulcerations no nodularity no palpable masses, no erythema or nodularity  Musculoskeletal: Bilateral knee varus range of motion still good no instability      Past Medical History:  Diagnosis Date   Aortic valve sclerosis    Atherosclerosis of coronary artery without angina pectoris 07/05/2014   Cervical spondylosis without myelopathy 02/13/2013   Chest pain 01/29/2019   Degeneration of cervical intervertebral disc 02/13/2013   Depression    sometimes   Diabetes mellitus type 2, diet-controlled (HCC)    Elevated troponin    GERD (gastroesophageal reflux disease)    Heart murmur    HTN (hypertension) 07/06/2013   Hypercholesterolemia 08/21/2015   Hyperlipidemia 07/06/2013   Medication management 08/21/2015   Pre-diabetes    Rectal bleeding 09/21/2019   Stiffness of left shoulder joint 07/19/2013    Past Surgical History:  Procedure Laterality Date   BIOPSY  12/14/2019   Procedure: BIOPSY;  Surgeon: Daneil Dolin, MD;  [facility];  Service: Endoscopy;;   CERVICAL SPINE SURGERY     COLONOSCOPY   07/26/2012   Procedure: COLONOSCOPY;  Surgeon: Jamesetta So, MD;  [facility];  Service: Gastroenterology;  Laterality: N/A;   COLONOSCOPY WITH PROPOFOL N/A 12/14/2019   Procedure: COLONOSCOPY WITH PROPOFOL;  Surgeon: Daneil Dolin, MD;  [facility];  Service: Endoscopy;  Laterality: N/A;  9:00am   KNEE ARTHROSCOPY Left    torn ligament   LUMBAR LAMINECTOMY/DECOMPRESSION MICRODISCECTOMY Bilateral 12/27/2019   Procedure: BIATERAL LUMBAR TWO- LUMBAR THREE, LUMBAR THREE- LUMBAR FOUR, LUMBAR FOUR-FIVE LAMINOTOMY/FORAMINOTOMY 3 LEVELS;  Surgeon: Newman Pies, MD;  Location: [place];  Service: Neurosurgery;  Laterality: Bilateral;  POSTERIOR   PVC ABLATION N/A 04/18/2021   Procedure: PVC ABLATION;  Surgeon: Vickie Epley, MD;  Location: [facility];  Service: Cardiovascular;  Laterality: N/A;   SPINE SURGERY N/A    Phreesia 02/24/2020    Family History  Problem Relation Age of Onset   Asthma Mother    Hypertension Father    Colon cancer Neg Hx    Social History   Tobacco Use   Smoking status: Never    Passive exposure: Never   Smokeless tobacco: Never  Vaping Use   Vaping Use: Never used  Substance Use Topics   Alcohol use: No   Drug use: No    Allergies  Allergen Reactions   Metformin And Related Diarrhea and Nausea And Vomiting   Statins Other (See Comments)    Muscle/joint pain     No outpatient medications have been marked as taking for  the 12/29/21 encounter (Office Visit) with Carole Civil, MD.     MEDICAL DECISION MAKING  A.  Encounter Diagnosis  Name Primary?   Primary osteoarthritis of both knees Yes    B. DATA ANALYSED:   IMAGING: Interpretation of images: I have personally reviewed the images and my interpretation is internal images show bilateral varus knees grade 3 osteoarthritis medial compartment narrowing Orders: No new orders  Outside records reviewed: None   C. MANAGEMENT   Meloxicam daily if this  does not work consider injection  No orders of the defined types were placed in this encounter.    Arther Abbott, MD  12/29/2021 2:22 PM

## 2022-01-13 ENCOUNTER — Other Ambulatory Visit: Payer: Medicare HMO

## 2022-01-13 DIAGNOSIS — Z79899 Other long term (current) drug therapy: Secondary | ICD-10-CM | POA: Diagnosis not present

## 2022-01-13 DIAGNOSIS — I10 Essential (primary) hypertension: Secondary | ICD-10-CM

## 2022-01-14 LAB — BASIC METABOLIC PANEL
BUN/Creatinine Ratio: 12 (ref 10–24)
BUN: 16 mg/dL (ref 8–27)
CO2: 22 mmol/L (ref 20–29)
Calcium: 9.1 mg/dL (ref 8.6–10.2)
Chloride: 104 mmol/L (ref 96–106)
Creatinine, Ser: 1.37 mg/dL — ABNORMAL HIGH (ref 0.76–1.27)
Glucose: 102 mg/dL — ABNORMAL HIGH (ref 70–99)
Potassium: 4.4 mmol/L (ref 3.5–5.2)
Sodium: 142 mmol/L (ref 134–144)
eGFR: 52 mL/min/{1.73_m2} — ABNORMAL LOW (ref >59)

## 2022-01-21 ENCOUNTER — Other Ambulatory Visit: Payer: Self-pay | Admitting: Cardiovascular Disease

## 2022-01-21 DIAGNOSIS — I10 Essential (primary) hypertension: Secondary | ICD-10-CM

## 2022-02-04 ENCOUNTER — Encounter: Payer: Self-pay | Admitting: Internal Medicine

## 2022-02-04 ENCOUNTER — Ambulatory Visit (INDEPENDENT_AMBULATORY_CARE_PROVIDER_SITE_OTHER): Payer: Medicare HMO | Admitting: Internal Medicine

## 2022-02-04 VITALS — BP 116/68 | HR 60 | Ht 66.0 in | Wt 212.0 lb

## 2022-02-04 DIAGNOSIS — G4733 Obstructive sleep apnea (adult) (pediatric): Secondary | ICD-10-CM | POA: Diagnosis not present

## 2022-02-04 DIAGNOSIS — R1032 Left lower quadrant pain: Secondary | ICD-10-CM | POA: Diagnosis not present

## 2022-02-04 DIAGNOSIS — R42 Dizziness and giddiness: Secondary | ICD-10-CM | POA: Diagnosis not present

## 2022-02-04 DIAGNOSIS — E1140 Type 2 diabetes mellitus with diabetic neuropathy, unspecified: Secondary | ICD-10-CM

## 2022-02-04 DIAGNOSIS — R140 Abdominal distension (gaseous): Secondary | ICD-10-CM | POA: Diagnosis not present

## 2022-02-04 DIAGNOSIS — H6121 Impacted cerumen, right ear: Secondary | ICD-10-CM

## 2022-02-04 MED ORDER — SIMETHICONE 125 MG PO TABS: 1.0000 | ORAL_TABLET | Freq: Three times a day (TID) | ORAL | 0 refills | Status: DC | PRN

## 2022-02-04 MED ORDER — MECLIZINE HCL 25 MG PO TABS: 25.0000 mg | ORAL_TABLET | Freq: Three times a day (TID) | ORAL | 1 refills | Status: AC | PRN

## 2022-02-04 NOTE — Progress Notes (Signed)
ab 

## 2022-02-04 NOTE — Progress Notes (Signed)
Acute Office Visit  Subjective:    [name], male    DOB: 10/06/1940, 81 y.o.   MRN: [id number]  Chief Complaint  Patient presents with   Abdominal Pain    Pt c/o abdominal pain, began two days ago, has not taken anything otc, c/o bloating .     HPI Patient is in today for complaint of LLQ abdominal pain, which started 2 days ago.  He had severe, nonradiating abdominal pain yesterday, which improved later.  Today, he complains of abdominal bloating, distention and recurrence of LLQ abdominal pain.  He has regular BMs, denies any melena or hematochezia.  He denies any vomiting currently.  Denies any fever or chills.  He also reports chronic dizziness, which is intermittent.  He also reports chronic tinnitus.  Denies any ear pain or discharge.  He also complains of right leg numbness and cold sensation.  Denies any recent injury or immobilization.  Of note, he has history of diabetic neuropathy and takes gabapentin for it.  DPA pulse is intact b/l.  Past Medical History:  Diagnosis Date   Aortic valve sclerosis    Atherosclerosis of coronary artery without angina pectoris 07/05/2014   Cervical spondylosis without myelopathy 02/13/2013   Chest pain 01/29/2019   Degeneration of cervical intervertebral disc 02/13/2013   Depression    sometimes   Diabetes mellitus type 2, diet-controlled (HCC)    Elevated troponin    GERD (gastroesophageal reflux disease)    Heart murmur    HTN (hypertension) 07/06/2013   Hypercholesterolemia 08/21/2015   Hyperlipidemia 07/06/2013   Medication management 08/21/2015   Pre-diabetes    Rectal bleeding 09/21/2019   Stiffness of left shoulder joint 07/19/2013    Past Surgical History:  Procedure Laterality Date   BIOPSY  12/14/2019   Procedure: BIOPSY;  Surgeon: Daneil Dolin, MD;  [facility];  Service: Endoscopy;;   CERVICAL SPINE SURGERY     COLONOSCOPY  07/26/2012   Procedure: COLONOSCOPY;  Surgeon: Jamesetta So, MD;   [facility];  Service: Gastroenterology;  Laterality: N/A;   COLONOSCOPY WITH PROPOFOL N/A 12/14/2019   Procedure: COLONOSCOPY WITH PROPOFOL;  Surgeon: Daneil Dolin, MD;  [facility];  Service: Endoscopy;  Laterality: N/A;  9:00am   KNEE ARTHROSCOPY Left    torn ligament   LUMBAR LAMINECTOMY/DECOMPRESSION MICRODISCECTOMY Bilateral 12/27/2019   Procedure: BIATERAL LUMBAR TWO- LUMBAR THREE, LUMBAR THREE- LUMBAR FOUR, LUMBAR FOUR-FIVE LAMINOTOMY/FORAMINOTOMY 3 LEVELS;  Surgeon: Newman Pies, MD;  Location: [place];  Service: Neurosurgery;  Laterality: Bilateral;  POSTERIOR   PVC ABLATION N/A 04/18/2021   Procedure: PVC ABLATION;  Surgeon: Vickie Epley, MD;  Location: [facility];  Service: Cardiovascular;  Laterality: N/A;   SPINE SURGERY N/A    Phreesia 02/24/2020    Family History  Problem Relation Age of Onset   Asthma Mother    Hypertension Father    Colon cancer Neg Hx     Social History   Socioeconomic History   Marital status: Widowed    Spouse name: Cherly    Number of children: 1   Years of education: Not on file   Highest education level: 12th grade  Occupational History    Comment: Retired   Tobacco Use   Smoking status: Never    Passive exposure: Never   Smokeless tobacco: Never  Vaping Use   Vaping Use: Never used  Substance and Sexual Activity   Alcohol use: No  Drug use: No   Sexual activity: Not Currently  Other Topics Concern   Not on file  Social History Narrative   Live alone right now, wife passed in 2009    Douglas Keller 2 live in [place]      Enjoys playing golf      Diet: eats what he likes, sweet, fast food, does not eat a lot of canned or boxed foods.    Does not eat enough fruits and veggies.       Caffeine: 1 cup daily   Water: 3-4 bottles of water daily, Gatorade every now and then         Wears seat belt   Wears sun protection when golfing   Smoke detectors at home   Does not use  phone while driving   Social Determinants of Health   Financial Resource Strain: Low Risk  (03/03/2021)   Overall Financial Resource Strain (CARDIA)    Difficulty of Paying Living Expenses: Not hard at all  Food Insecurity: No Food Insecurity (03/03/2021)   Hunger Vital Sign    Worried About Running Out of Food in the Last Year: Never true    Ran Out of Food in the Last Year: Never true  Transportation Needs: No Transportation Needs (03/03/2021)   PRAPARE - Hydrologist (Medical): No    Lack of Transportation (Non-Medical): No  Physical Activity: Inactive (03/03/2021)   Exercise Vital Sign    Days of Exercise per Week: 0 days    Minutes of Exercise per Session: 0 min  Stress: No Stress Concern Present (03/03/2021)   [place]    Feeling of Stress : Not at all  Social Connections: Moderately Isolated (03/03/2021)   Social Connection and Isolation Panel [NHANES]    Frequency of Communication with Friends and Family: More than three times a week    Frequency of Social Gatherings with Friends and Family: More than three times a week    Attends Religious Services: More than 4 times per year    Active Member of [employer] or Organizations: No    Attends Archivist Meetings: Never    Marital Status: Widowed  Intimate Partner Violence: Not At Risk (03/03/2021)   Humiliation, Afraid, Rape, and Kick questionnaire    Fear of Current or Ex-Partner: No    Emotionally Abused: No    Physically Abused: No    Sexually Abused: No    Outpatient Medications Prior to Visit  Medication Sig Dispense Refill   amiodarone (PACERONE) 200 MG tablet Take 1 tablet (200 mg total) by mouth 2 (two) times daily for 7 days, THEN 1 tablet (200 mg total) daily. 97 tablet 3   amLODipine (NORVASC) 5 MG tablet Take 1 tablet (5 mg total) by mouth daily. 90 tablet 1   aspirin EC 81 MG tablet Take 81 mg by mouth daily.      benazepril (LOTENSIN) 10 MG tablet Take 1 tablet (10 mg total) by mouth daily. 90 tablet 1   blood glucose meter kit and supplies Dispense based on patient and insurance preference. Use up to four times daily as directed. (FOR ICD-10 E10.9, E11.9). 1 each 0   Evolocumab (REPATHA SURECLICK) 425 MG/ML SOAJ INJECT 140 MG INTO THE SKIN EVERY 14 DAY 6 mL 3   gabapentin (NEURONTIN) 300 MG capsule Take 300 mg three times daily. 270 capsule 1   glipiZIDE (GLIPIZIDE XL) 2.5 MG 24 hr  tablet Take 1 tablet (2.5 mg total) by mouth daily with breakfast. 90 tablet 1   magnesium oxide (MAG-OX) 400 MG tablet Take 400 mg by mouth daily.     meloxicam (MOBIC) 7.5 MG tablet Take 1 tablet (7.5 mg total) by mouth daily. 90 tablet 2   metoprolol succinate (TOPROL-XL) 25 MG 24 hr tablet TAKE 1 TABLET DAILY. TAKE WITH OR IMMEDIATELY FOLLOWING A MEAL. 90 tablet 3   multivitamin-iron-minerals-folic acid (CENTRUM) chewable tablet Chew 1 tablet by mouth daily.     OVER THE COUNTER MEDICATION Apple cider vinegar and Ginger Gummy - take 1 gummy by mouth once daily     spironolactone-hydrochlorothiazide (ALDACTAZIDE) 25-25 MG tablet TAKE 1 TABLET EVERY OTHER DAY 45 tablet 0   meclizine (ANTIVERT) 25 MG tablet Take 1 tablet (25 mg total) by mouth 3 (three) times daily as needed for dizziness. 30 tablet 0   pantoprazole (PROTONIX) 40 MG tablet Take 1 tablet (40 mg total) by mouth daily. 90 tablet 2   No facility-administered medications prior to visit.    Allergies  Allergen Reactions   Metformin And Related Diarrhea and Nausea And Vomiting   Statins Other (See Comments)    Muscle/joint pain     Review of Systems  Constitutional:  Negative for chills and fever.  HENT:  Positive for tinnitus. Negative for congestion, sinus pressure and sinus pain.   Respiratory:  Negative for cough and shortness of breath.   Cardiovascular:  Negative for chest pain and palpitations.  Gastrointestinal:  Positive for abdominal distention  and abdominal pain. Negative for diarrhea and vomiting.  Genitourinary:  Negative for dysuria and hematuria.  Musculoskeletal:  Negative for neck pain and neck stiffness.  Skin:  Negative for rash.  Neurological:  Positive for dizziness and numbness. Negative for weakness.  Psychiatric/Behavioral:  Negative for agitation and behavioral problems.        Objective:    Physical Exam Vitals reviewed.  Constitutional:      General: He is not in acute distress.    Appearance: He is not diaphoretic.  HENT:     Head: Normocephalic and atraumatic.     Right Ear: There is impacted cerumen.     Left Ear: There is no impacted cerumen.     Nose: Nose normal.     Mouth/Throat:     Mouth: Mucous membranes are moist.  Eyes:     General: No scleral icterus.    Extraocular Movements: Extraocular movements intact.     Pupils: Pupils are equal, round, and reactive to light.  Cardiovascular:     Rate and Rhythm: Normal rate and regular rhythm.     Pulses: Normal pulses.     Heart sounds: Normal heart sounds. No murmur heard. Pulmonary:     Breath sounds: Normal breath sounds. No wheezing or rales.  Abdominal:     General: There is distension.     Palpations: Abdomen is soft.     Tenderness: There is no abdominal tenderness. There is no guarding or rebound.  Musculoskeletal:     Cervical back: Neck supple. No tenderness.     Right lower leg: No edema.     Left lower leg: No edema.  Skin:    General: Skin is warm.     Findings: No rash.  Neurological:     General: No focal deficit present.     Mental Status: He is alert and oriented to person, place, and time.  Psychiatric:  Mood and Affect: Mood normal.        Behavior: Behavior normal.     BP 116/68   Pulse 60   Ht _0  (1.676 m)   Wt 212 lb (96.2 kg)   SpO2 93%   BMI 34.22 kg/m  Wt Readings from Last 3 Encounters:  02/04/22 212 lb (96.2 kg)  11/25/21 209 lb (94.8 kg)  08/28/21 210 lb 0.6 oz (95.3 kg)         Assessment & Plan:   Problem List Items Addressed This Visit       Nervous and Auditory   Impacted ear wax Advised to use Debrox eardrops   Other Visit Diagnoses     Vertigo    -  Primary Meclizine as needed for dizziness Maintain adequate hydration Avoid sudden positional changes   Relevant Medications   meclizine (ANTIVERT) 25 MG tablet   Left lower quadrant abdominal pain     Has acute onset LLQ abdominal pain with mild distention Check CT abdomen pelvis No concern for UTI currently    Relevant Orders   CT Abdomen Pelvis Wo Contrast   Bloating     Simethicone as needed for bloating   Relevant Medications   Simethicone 125 MG TABS   Diabetic neuropathy On gabapentin His leg numbness more likely due to diabetic neuropathy DPA pulse intact, less likely vascular etiology  Meds ordered this encounter  Medications   meclizine (ANTIVERT) 25 MG tablet    Sig: Take 1 tablet (25 mg total) by mouth 3 (three) times daily as needed for dizziness.    Dispense:  30 tablet    Refill:  1   Simethicone 125 MG TABS    Sig: Take 1 tablet (125 mg total) by mouth 3 (three) times daily as needed (Bloating).    Dispense:  120 tablet    Refill:  0     Katey Barrie Keith Rake, MD

## 2022-02-04 NOTE — Patient Instructions (Addendum)
Please take Meclizine as needed for dizziness.  Please maintain adequate hydration by taking at least 50 ounces of fluid in a day.  Please take Simethicone for bloating.  Please use Debrox ear drops for ear wax. Avoid using any sharp objects for cleaning purposes.

## 2022-02-05 ENCOUNTER — Other Ambulatory Visit: Payer: Self-pay | Admitting: Internal Medicine

## 2022-02-05 ENCOUNTER — Telehealth: Payer: Self-pay

## 2022-02-05 DIAGNOSIS — R1032 Left lower quadrant pain: Secondary | ICD-10-CM

## 2022-02-05 NOTE — Telephone Encounter (Signed)
[employer] called states ultrasound or xray need to be done before CT can be ordered.

## 2022-02-06 NOTE — Telephone Encounter (Signed)
Pt informed

## 2022-02-06 NOTE — Telephone Encounter (Signed)
[place] scheduled on 02/16/22 @ 9:30am

## 2022-02-16 ENCOUNTER — Other Ambulatory Visit: Payer: Self-pay

## 2022-02-16 ENCOUNTER — Ambulatory Visit (HOSPITAL_COMMUNITY)
Admission: RE | Admit: 2022-02-16 | Discharge: 2022-02-16 | Disposition: A | Discharge status: Home / Self Care | Payer: Medicare HMO | Source: Ambulatory Visit | Attending: Internal Medicine | Admitting: Internal Medicine

## 2022-02-16 ENCOUNTER — Other Ambulatory Visit: Payer: Self-pay | Admitting: Internal Medicine

## 2022-02-16 ENCOUNTER — Telehealth: Payer: Self-pay | Admitting: Nurse Practitioner

## 2022-02-16 ENCOUNTER — Telehealth: Payer: Self-pay

## 2022-02-16 DIAGNOSIS — K828 Other specified diseases of gallbladder: Secondary | ICD-10-CM

## 2022-02-16 DIAGNOSIS — K7689 Other specified diseases of liver: Secondary | ICD-10-CM | POA: Diagnosis not present

## 2022-02-16 DIAGNOSIS — R1032 Left lower quadrant pain: Secondary | ICD-10-CM | POA: Diagnosis not present

## 2022-02-16 MED ORDER — AMLODIPINE BESYLATE 5 MG PO TABS: 5.0000 mg | ORAL_TABLET | Freq: Every day | ORAL | 1 refills | Status: DC

## 2022-02-16 MED ORDER — AMIODARONE HCL 200 MG PO TABS: 200.0000 mg | ORAL_TABLET | Freq: Every day | ORAL | 1 refills | Status: DC

## 2022-02-16 NOTE — Telephone Encounter (Signed)
Ruby with [facility] called with a report. Please call back to obtain results.      Call back # [phone]

## 2022-02-16 NOTE — Telephone Encounter (Signed)
Patient called need refill  glipiZIDE (GLIPIZIDE XL) 2.5 MG 24 hr tablet   Pharmacy  [facility] - [place], [place]  [place], [place] [address]  Phone:  [phone]  Fax:  [phone]

## 2022-02-17 ENCOUNTER — Other Ambulatory Visit: Payer: Self-pay | Admitting: *Deleted

## 2022-02-17 ENCOUNTER — Other Ambulatory Visit: Payer: Self-pay

## 2022-02-17 ENCOUNTER — Telehealth: Payer: Self-pay

## 2022-02-17 MED ORDER — GLIPIZIDE ER 2.5 MG PO TB24: 2.5000 mg | ORAL_TABLET | Freq: Every day | ORAL | 1 refills | Status: DC

## 2022-02-17 NOTE — Telephone Encounter (Signed)
error 

## 2022-02-17 NOTE — Telephone Encounter (Signed)
Rx sent 

## 2022-02-17 NOTE — Progress Notes (Signed)
Patient advised with verbal understanding  

## 2022-02-17 NOTE — Telephone Encounter (Signed)
Called back for report, spoke with gabriel, mass like soft tissue in gallbladder lumen may represent a large amount of sluge and associated calcifacation underlying gallbladder mass is not entirely excluded and recommending dedicated eval with pre and post contrast, mri,for definitetive characteristics looks like order is in from [facility]

## 2022-02-26 ENCOUNTER — Other Ambulatory Visit: Payer: Self-pay

## 2022-02-26 ENCOUNTER — Telehealth: Payer: Self-pay | Admitting: Cardiovascular Disease

## 2022-02-26 DIAGNOSIS — Z79899 Other long term (current) drug therapy: Secondary | ICD-10-CM

## 2022-02-26 NOTE — Telephone Encounter (Signed)
Patient stated that Dr. Sallyanne Kuster wanted him to have blood work  to check thyroid status every 6 weeks because of taking amiodarone. Please advise on blood work.

## 2022-02-26 NOTE — Telephone Encounter (Signed)
It is every 6 months (not 6 weeks): CMET and TSH

## 2022-02-26 NOTE — Telephone Encounter (Signed)
Patient advised of blood work every 6 months. Order placed for CMET and TSH. Patient stated he will get lab work in [place].

## 2022-02-26 NOTE — Telephone Encounter (Signed)
  Pt said, Dr. Loletha Grayer wants him to get blood work every 6 weeks. He wanted to know if the order for labs was sent to his pcp so he can just get it over there instead going to [facility]

## 2022-02-27 DIAGNOSIS — Z79899 Other long term (current) drug therapy: Secondary | ICD-10-CM | POA: Diagnosis not present

## 2022-02-28 LAB — COMPREHENSIVE METABOLIC PANEL
ALT: 32 IU/L (ref 0–44)
AST: 25 IU/L (ref 0–40)
Albumin/Globulin Ratio: 1.4 (ref 1.2–2.2)
Albumin: 4.1 g/dL (ref 3.8–4.8)
Alkaline Phosphatase: 89 IU/L (ref 44–121)
BUN/Creatinine Ratio: 10 (ref 10–24)
BUN: 12 mg/dL (ref 8–27)
Bilirubin Total: 0.5 mg/dL (ref 0.0–1.2)
CO2: 23 mmol/L (ref 20–29)
Calcium: 8.8 mg/dL (ref 8.6–10.2)
Chloride: 105 mmol/L (ref 96–106)
Creatinine, Ser: 1.2 mg/dL (ref 0.76–1.27)
Globulin, Total: 2.9 g/dL (ref 1.5–4.5)
Glucose: 119 mg/dL — ABNORMAL HIGH (ref 70–99)
Potassium: 4.5 mmol/L (ref 3.5–5.2)
Sodium: 144 mmol/L (ref 134–144)
Total Protein: 7 g/dL (ref 6.0–8.5)
eGFR: 61 mL/min/{1.73_m2} (ref >59)

## 2022-02-28 LAB — TSH: TSH: 2.61 u[IU]/mL (ref 0.450–4.500)

## 2022-03-05 ENCOUNTER — Ambulatory Visit (HOSPITAL_COMMUNITY)
Admission: RE | Admit: 2022-03-05 | Discharge: 2022-03-05 | Disposition: A | Discharge status: Home / Self Care | Payer: Medicare HMO | Source: Ambulatory Visit | Attending: Internal Medicine | Admitting: Internal Medicine

## 2022-03-05 ENCOUNTER — Other Ambulatory Visit: Payer: Self-pay | Admitting: Internal Medicine

## 2022-03-05 DIAGNOSIS — K828 Other specified diseases of gallbladder: Secondary | ICD-10-CM | POA: Diagnosis not present

## 2022-03-05 DIAGNOSIS — K760 Fatty (change of) liver, not elsewhere classified: Secondary | ICD-10-CM | POA: Diagnosis not present

## 2022-03-05 DIAGNOSIS — K8020 Calculus of gallbladder without cholecystitis without obstruction: Secondary | ICD-10-CM | POA: Diagnosis not present

## 2022-03-05 MED ORDER — GADOBUTROL 1 MMOL/ML IV SOLN
10.0000 mL | Freq: Once | INTRAVENOUS | Status: AC | PRN
Start: 2022-03-05 — End: 2022-03-05
  Administered 2022-03-05: 10 mL via INTRAVENOUS

## 2022-03-09 ENCOUNTER — Ambulatory Visit (INDEPENDENT_AMBULATORY_CARE_PROVIDER_SITE_OTHER): Payer: Medicare HMO

## 2022-03-09 DIAGNOSIS — Z0000 Encounter for general adult medical examination without abnormal findings: Secondary | ICD-10-CM | POA: Diagnosis not present

## 2022-03-09 NOTE — Progress Notes (Signed)
Subjective:   Douglas Keller is a 81 y.o. male who presents for Medicare Annual/Subsequent preventive examination.  Review of Systems     Cardiac Risk Factors include: advanced age (>25mn, >>44women);hypertension;dyslipidemia;sedentary lifestyle     Objective:    There were no vitals filed for this visit. There is no height or weight on file to calculate BMI.     04/18/2021    8:43 AM 03/05/2021    3:54 AM 03/03/2021    8:46 AM 01/20/2021    1:26 AM 12/13/2020    2:12 AM 02/28/2020    4:44 PM 12/27/2019    8:11 AM  Advanced Directives  Does Patient Have a Medical Advance Directive? Yes _0  No  Type of Advance Directive Healthcare Power of Attorney        Does patient want to make changes to medical advance directive? No - Patient declined        Copy of [place]? No - copy requested        Would patient like information on creating a medical advance directive?  No - Patient declined No - Patient declined No - Patient declined  Yes (ED - Information included in AVS)     Current Medications (verified) Outpatient Encounter Medications as of 03/09/2022  Medication Sig   amiodarone (PACERONE) 200 MG tablet Take 1 tablet (200 mg total) by mouth daily.   amLODipine (NORVASC) 5 MG tablet Take 1 tablet (5 mg total) by mouth daily.   aspirin EC 81 MG tablet Take 81 mg by mouth daily.   benazepril (LOTENSIN) 10 MG tablet Take 1 tablet (10 mg total) by mouth daily.   blood glucose meter kit and supplies Dispense based on patient and insurance preference. Use up to four times daily as directed. (FOR ICD-10 E10.9, E11.9).   Evolocumab (REPATHA SURECLICK) 1323MG/ML SOAJ INJECT 140 MG INTO THE SKIN EVERY 14 DAY   gabapentin (NEURONTIN) 300 MG capsule Take 300 mg three times daily.   glipiZIDE (GLIPIZIDE XL) 2.5 MG 24 hr tablet Take 1 tablet (2.5 mg total) by mouth daily with breakfast.   magnesium oxide (MAG-OX) 400 MG tablet Take 400 mg by mouth daily.    meclizine (ANTIVERT) 25 MG tablet Take 1 tablet (25 mg total) by mouth 3 (three) times daily as needed for dizziness.   meloxicam (MOBIC) 7.5 MG tablet Take 1 tablet (7.5 mg total) by mouth daily.   metoprolol succinate (TOPROL-XL) 25 MG 24 hr tablet TAKE 1 TABLET DAILY. TAKE WITH OR IMMEDIATELY FOLLOWING A MEAL.   multivitamin-iron-minerals-folic acid (CENTRUM) chewable tablet Chew 1 tablet by mouth daily.   OVER THE COUNTER MEDICATION Apple cider vinegar and Ginger Gummy - take 1 gummy by mouth once daily   pantoprazole (PROTONIX) 40 MG tablet Take 1 tablet (40 mg total) by mouth daily.   Simethicone 125 MG TABS Take 1 tablet (125 mg total) by mouth 3 (three) times daily as needed (Bloating).   spironolactone-hydrochlorothiazide (ALDACTAZIDE) 25-25 MG tablet TAKE 1 TABLET EVERY OTHER DAY   No facility-administered encounter medications on file as of 03/09/2022.    Allergies (verified) Metformin and related and Statins   History: Past Medical History:  Diagnosis Date   Aortic valve sclerosis    Atherosclerosis of coronary artery without angina pectoris 07/05/2014   Cervical spondylosis without myelopathy 02/13/2013   Chest pain 01/29/2019   Degeneration of cervical intervertebral disc 02/13/2013   Depression    sometimes  Diabetes mellitus type 2, diet-controlled (HCC)    Elevated troponin    GERD (gastroesophageal reflux disease)    Heart murmur    HTN (hypertension) 07/06/2013   Hypercholesterolemia 08/21/2015   Hyperlipidemia 07/06/2013   Medication management 08/21/2015   Pre-diabetes    Rectal bleeding 09/21/2019   Stiffness of left shoulder joint 07/19/2013   Past Surgical History:  Procedure Laterality Date   BIOPSY  12/14/2019   Procedure: BIOPSY;  Surgeon: Daneil Dolin, MD;  [facility]: [facility];  Service: Endoscopy;;   CERVICAL SPINE SURGERY     COLONOSCOPY  07/26/2012   Procedure: COLONOSCOPY;  Surgeon: Jamesetta So, MD;  [facility]: [facility];  Service:  Gastroenterology;  Laterality: N/A;   COLONOSCOPY WITH PROPOFOL N/A 12/14/2019   Procedure: COLONOSCOPY WITH PROPOFOL;  Surgeon: Daneil Dolin, MD;  Location: [facility];  Service: Endoscopy;  Laterality: N/A;  9:00am   KNEE ARTHROSCOPY Left    torn ligament   LUMBAR LAMINECTOMY/DECOMPRESSION MICRODISCECTOMY Bilateral 12/27/2019   Procedure: BIATERAL LUMBAR TWO- LUMBAR THREE, LUMBAR THREE- LUMBAR FOUR, LUMBAR FOUR-FIVE LAMINOTOMY/FORAMINOTOMY 3 LEVELS;  Surgeon: Newman Pies, MD;  Location: [facility];  Service: Neurosurgery;  Laterality: Bilateral;  POSTERIOR   PVC ABLATION N/A 04/18/2021   Procedure: PVC ABLATION;  Surgeon: Vickie Epley, MD;  Location: [facility];  Service: Cardiovascular;  Laterality: N/A;   SPINE SURGERY N/A    Phreesia 02/24/2020   Family History  Problem Relation Age of Onset   Asthma Mother    Hypertension Father    Colon cancer Neg Hx    Social History   Socioeconomic History   Marital status: Widowed    Spouse name: Cherly    Number of children: 1   Years of education: Not on file   Highest education level: 12th grade  Occupational History    Comment: Retired   Tobacco Use   Smoking status: Never    Passive exposure: Never   Smokeless tobacco: Never  Vaping Use   Vaping Use: Never used  Substance and Sexual Activity   Alcohol use: No   Drug use: No   Sexual activity: Not Currently  Other Topics Concern   Not on file  Social History Narrative   Live alone right now, wife passed in 2009    [place] daughter-grandchild 2 live in [place]      Enjoys playing golf      Diet: eats what he likes, sweet, fast food, does not eat a lot of canned or boxed foods.    Does not eat enough fruits and veggies.       Caffeine: 1 cup daily   Water: 3-4 bottles of water daily, Gatorade every now and then         Wears seat belt   Wears sun protection when golfing   Smoke detectors at home   Does not use phone while driving   Social  Determinants of Health   Financial Resource Strain: Low Risk  (03/03/2021)   Overall Financial Resource Strain (CARDIA)    Difficulty of Paying Living Expenses: Not hard at all  Food Insecurity: No Food Insecurity (03/09/2022)   Hunger Vital Sign    Worried About Running Out of Food in the Last Year: Never true    Ran Out of Food in the Last Year: Never true  Transportation Needs: No Transportation Needs (03/09/2022)   PRAPARE - Hydrologist (Medical): No  Lack of Transportation (Non-Medical): No  Physical Activity: Inactive (03/09/2022)   Exercise Vital Sign    Days of Exercise per Week: 0 days    Minutes of Exercise per Session: 0 min  Stress: No Stress Concern Present (03/03/2021)   [place]    Feeling of Stress : Not at all  Social Connections: Moderately Isolated (03/09/2022)   Social Connection and Isolation Panel [NHANES]    Frequency of Communication with Friends and Family: More than three times a week    Frequency of Social Gatherings with Friends and Family: More than three times a week    Attends Religious Services: More than 4 times per year    Active Member of [employer] or Organizations: No    Attends Archivist Meetings: Never    Marital Status: Widowed    Tobacco Counseling Counseling given: Not Answered   Clinical Intake:              How often do you need to have someone help you when you read instructions, pamphlets, or other written materials from your doctor or pharmacy?: (P) 1 - Never  Diabetic?Nutrition Risk Assessment:  Has the patient had any N/V/D within the last 2 months?  No  Does the patient have any non-healing wounds?  No  Has the patient had any unintentional weight loss or weight gain?  No   Diabetes:  Is the patient diabetic?  Yes  If diabetic, was a CBG obtained today?  No  Did the patient bring in their glucometer from home?  No   How often do you monitor your CBG's? doesn't.   Financial Strains and Diabetes Management:  Are you having any financial strains with the device, your supplies or your medication? No .  Does the patient want to be seen by Chronic Care Management for management of their diabetes?  No  Would the patient like to be referred to a Nutritionist or for Diabetic Management?  No   Diabetic Exams:  Diabetic Eye Exam: Completed yes Diabetic Foot Exam: Completed yes    Interpreter Needed?: No      Activities of Daily Living    03/09/2022    9:48 AM 03/03/2022    8:56 PM  In your present state of health, do you have any difficulty performing the following activities:  Hearing? 0 0  Vision? 0 1  Difficulty concentrating or making decisions? 0 1  Walking or climbing stairs? 1 1  Dressing or bathing? 0 0  Doing errands, shopping? 0 0  Preparing Food and eating ? N N  Using the Toilet? N N  In the past six months, have you accidently leaked urine? N N  Do you have problems with loss of bowel control? N N  Managing your Medications? N N  Managing your Finances? N N  Housekeeping or managing your Housekeeping? N Y    Patient Care Team: Renee Rival, FNP as PCP - General (Nurse Practitioner) Sanda Klein, MD as PCP - Cardiology (Cardiology) Vickie Epley, MD as PCP - Electrophysiology (Cardiology) Gala Romney [name], MD as Consulting Physician (Gastroenterology)  Indicate any recent Medical Services you may have received from other than [facility] providers in the past year (date may be approximate).     Assessment:   This is a routine wellness examination for [place].  Hearing/Vision screen No results found.  Dietary issues and exercise activities discussed: Current Exercise Habits: The patient does not participate  in regular exercise at present   Goals Addressed   None    Depression Screen    02/04/2022   11:22 AM 11/25/2021   10:08 AM 08/28/2021    9:51 AM 03/03/2021     8:46 AM 03/03/2021    8:44 AM 02/25/2021    9:58 AM 01/28/2021    8:32 AM  PHQ 2/9 Scores  PHQ - 2 Score 0 0 0 0 0 0 0    Fall Risk    03/09/2022    9:48 AM 03/03/2022    8:56 PM 02/04/2022   11:22 AM 11/25/2021   10:08 AM 08/28/2021    9:51 AM  Fall Risk   Falls in the past year? 0 0 0 0 0  Number falls in past yr: 0  0 0 0  Injury with Fall? 0  0 0 0  Risk for fall due to :   No Fall Risks History of fall(s);No Fall Risks No Fall Risks  Follow up   Falls evaluation completed Falls evaluation completed Falls evaluation completed    [place]:  Any stairs in or around the home? No  If so, are there any without handrails? No  Home free of loose throw rugs in walkways, pet beds, electrical cords, etc? Yes  Adequate lighting in your home to reduce risk of falls? Yes   ASSISTIVE DEVICES UTILIZED TO PREVENT FALLS:  Life alert? No  Use of a cane, walker or w/c? No  Grab bars in the bathroom? Yes  Shower chair or bench in shower? Yes  Elevated toilet seat or a handicapped toilet? No     Cognitive Function:    03/03/2021    8:47 AM  MMSE - Mini Mental State Exam  Not completed: Unable to complete        03/09/2022    9:49 AM 03/03/2021    8:47 AM 02/28/2020    4:32 PM 02/28/2020    4:31 PM  6CIT Screen  What Year? 0 points 0 points 0 points 0 points  What month? 0 points 0 points 0 points 0 points  What time? 0 points 0 points 0 points   Count back from 20 0 points 0 points 0 points   Months in reverse 0 points 0 points 0 points   Repeat phrase 4 points 0 points 0 points   Total Score 4 points 0 points 0 points     Immunizations Immunization History  Administered Date(s) Administered   Fluad Quad(high Dose 65+) 03/20/2019, 05/03/2020   Influenza-Unspecified 04/19/2014, 05/20/2021   Moderna Covid-19 Vaccine Bivalent Booster 42yr & up 10/31/2020   Moderna Sars-Covid-2 Vaccination 08/11/2019, 09/11/2019, 05/15/2020   Pneumococcal  Polysaccharide-23 11/29/2019   Pneumococcal-Unspecified 12/26/2021   Td 04/21/2011, 12/26/2021   Zoster Recombinat (Shingrix) 04/15/2020, 08/19/2020    TDAP status: Up to date  Flu Vaccine status: Due, Education has been provided regarding the importance of this vaccine. Advised may receive this vaccine at local pharmacy or Health Dept. Aware to provide a copy of the vaccination record if obtained from local pharmacy or Health Dept. Verbalized acceptance and understanding.  Pneumococcal vaccine status: Up to date  Covid-19 vaccine status: Completed vaccines  Qualifies for Shingles Vaccine? Yes   Zostavax completed No   Shingrix Completed?: Yes  Screening Tests Health Maintenance  Topic Date Due   COVID-19 Vaccine (5 - Moderna risk series) 12/26/2020   INFLUENZA VACCINE  02/17/2022   HEMOGLOBIN A1C  05/31/2022  FOOT EXAM  09/02/2022   OPHTHALMOLOGY EXAM  09/16/2022   Pneumonia Vaccine 64+ Years old (2 - PCV) 12/27/2022   TETANUS/TDAP  12/27/2031   Zoster Vaccines- Shingrix  Completed   HPV VACCINES  Aged Out    Health Maintenance  Health Maintenance Due  Topic Date Due   COVID-19 Vaccine (5 - Moderna risk series) 12/26/2020   INFLUENZA VACCINE  02/17/2022    Colorectal cancer screening: No longer required.   Lung Cancer Screening: (Low Dose CT Chest recommended if Age 66-80 years, 30 pack-year currently smoking OR have quit w/in 15years.) does not qualify.   Lung Cancer Screening Referral: na  Additional Screening:  Hepatitis C Screening: does qualify; Completed   Vision Screening: Recommended annual ophthalmology exams for early detection of glaucoma and other disorders of the eye. Is the patient up to date with their annual eye exam?  Yes  Who is the provider or what is the name of the office in which the patient attends annual eye exams?  If pt is not established with a provider, would they like to be referred to a provider to establish care? No .   Dental  Screening: Recommended annual dental exams for proper oral hygiene  Community Resource Referral / Chronic Care Management: CRR required this visit?  No   CCM required this visit?  No      Plan:     I have personally reviewed and noted the following in the patient's chart:   Medical and social history Use of alcohol, tobacco or illicit drugs  Current medications and supplements including opioid prescriptions. Patient is not currently taking opioid prescriptions. Functional ability and status Nutritional status Physical activity Advanced directives List of other physicians Hospitalizations, surgeries, and ER visits in previous 12 months Vitals Screenings to include cognitive, depression, and falls Referrals and appointments  In addition, I have reviewed and discussed with patient certain preventive protocols, quality metrics, and best practice recommendations. A written personalized care plan for preventive services as well as general preventive health recommendations were provided to patient.     Eual Fines, LPN   5/60/2782   Nurse Notes: Schedule your next wellness visit for 1 year at checkout

## 2022-03-10 ENCOUNTER — Encounter: Payer: Self-pay | Admitting: Podiatry

## 2022-03-10 ENCOUNTER — Other Ambulatory Visit: Payer: Self-pay

## 2022-03-10 ENCOUNTER — Ambulatory Visit: Payer: Medicare HMO | Admitting: Podiatry

## 2022-03-10 ENCOUNTER — Encounter (HOSPITAL_COMMUNITY): Payer: Self-pay

## 2022-03-10 DIAGNOSIS — I160 Hypertensive urgency: Secondary | ICD-10-CM | POA: Diagnosis not present

## 2022-03-10 DIAGNOSIS — Z7982 Long term (current) use of aspirin: Secondary | ICD-10-CM | POA: Diagnosis not present

## 2022-03-10 DIAGNOSIS — R400 Somnolence: Secondary | ICD-10-CM | POA: Insufficient documentation

## 2022-03-10 DIAGNOSIS — I4891 Unspecified atrial fibrillation: Secondary | ICD-10-CM | POA: Insufficient documentation

## 2022-03-10 DIAGNOSIS — E119 Type 2 diabetes mellitus without complications: Secondary | ICD-10-CM | POA: Insufficient documentation

## 2022-03-10 DIAGNOSIS — B351 Tinea unguium: Secondary | ICD-10-CM | POA: Diagnosis not present

## 2022-03-10 DIAGNOSIS — M79675 Pain in left toe(s): Secondary | ICD-10-CM

## 2022-03-10 DIAGNOSIS — E1169 Type 2 diabetes mellitus with other specified complication: Secondary | ICD-10-CM

## 2022-03-10 DIAGNOSIS — E785 Hyperlipidemia, unspecified: Secondary | ICD-10-CM | POA: Diagnosis not present

## 2022-03-10 DIAGNOSIS — I10 Essential (primary) hypertension: Secondary | ICD-10-CM | POA: Insufficient documentation

## 2022-03-10 DIAGNOSIS — M79674 Pain in right toe(s): Secondary | ICD-10-CM

## 2022-03-10 DIAGNOSIS — Z79899 Other long term (current) drug therapy: Secondary | ICD-10-CM | POA: Diagnosis not present

## 2022-03-10 DIAGNOSIS — G4733 Obstructive sleep apnea (adult) (pediatric): Secondary | ICD-10-CM | POA: Insufficient documentation

## 2022-03-10 NOTE — ED Triage Notes (Signed)
Pt complaining of high blood pressure for about an hour. Michela Pitcher it will go up and then go down. Highest  systolic bp was 656

## 2022-03-11 ENCOUNTER — Emergency Department (HOSPITAL_COMMUNITY)
Admission: EM | Admit: 2022-03-11 | Discharge: 2022-03-11 | Disposition: A | Discharge status: Home / Self Care | Payer: Medicare HMO | Attending: Emergency Medicine | Admitting: Emergency Medicine

## 2022-03-11 DIAGNOSIS — I10 Essential (primary) hypertension: Secondary | ICD-10-CM | POA: Diagnosis not present

## 2022-03-11 DIAGNOSIS — I160 Hypertensive urgency: Secondary | ICD-10-CM

## 2022-03-11 LAB — BASIC METABOLIC PANEL
Anion gap: 7 (ref 5–15)
BUN: 18 mg/dL (ref 8–23)
CO2: 24 mmol/L (ref 22–32)
Calcium: 9 mg/dL (ref 8.9–10.3)
Chloride: 108 mmol/L (ref 98–111)
Creatinine, Ser: 1.29 mg/dL — ABNORMAL HIGH (ref 0.61–1.24)
GFR, Estimated: 56 mL/min — ABNORMAL LOW (ref >60)
Glucose, Bld: 108 mg/dL — ABNORMAL HIGH (ref 70–99)
Potassium: 3.9 mmol/L (ref 3.5–5.1)
Sodium: 139 mmol/L (ref 135–145)

## 2022-03-11 LAB — CBC WITH DIFFERENTIAL/PLATELET
Abs Immature Granulocytes: 0.03 10*3/uL (ref 0.00–0.07)
Basophils Absolute: 0.1 10*3/uL (ref 0.0–0.1)
Basophils Relative: 1 %
Eosinophils Absolute: 0.2 10*3/uL (ref 0.0–0.5)
Eosinophils Relative: 3 %
HCT: 43 % (ref 39.0–52.0)
Hemoglobin: 14.3 g/dL (ref 13.0–17.0)
Immature Granulocytes: 0 %
Lymphocytes Relative: 24 %
Lymphs Abs: 2 10*3/uL (ref 0.7–4.0)
MCH: 31 pg (ref 26.0–34.0)
MCHC: 33.3 g/dL (ref 30.0–36.0)
MCV: 93.1 fL (ref 80.0–100.0)
Monocytes Absolute: 1 10*3/uL (ref 0.1–1.0)
Monocytes Relative: 11 %
Neutro Abs: 5.1 10*3/uL (ref 1.7–7.7)
Neutrophils Relative %: 61 %
Platelets: 278 10*3/uL (ref 150–400)
RBC: 4.62 MIL/uL (ref 4.22–5.81)
RDW: 13.2 % (ref 11.5–15.5)
WBC: 8.4 10*3/uL (ref 4.0–10.5)
nRBC: 0 % (ref 0.0–0.2)

## 2022-03-11 NOTE — ED Notes (Signed)
Discharge instructions reviewed with patient. No questions or concerns were expressed. Patient discharged.

## 2022-03-11 NOTE — Discharge Instructions (Signed)
Continue your medications as previously prescribed.  Keep a record of your blood pressures at home and take this with you to your next doctor's appointment.  Ideally, you should be followed up in the next week.  Return to the ER for chest pain, sustained blood pressures greater than 200, or for other new and concerning symptoms.

## 2022-03-11 NOTE — ED Provider Notes (Signed)
[facility] EMERGENCY DEPARTMENT Provider Note   CSN: [id number] Arrival date & time: 03/10/22  2224     History  Chief Complaint  Patient presents with   Hypertension    Douglas Keller is a 81 y.o. male.  Patient is an 81 year old male with PMH of Type 2 DM, HTN, Hyperlipidemia, GERD.  Patient presenting today with complaints of elevated blood pressure.  He generally checks his BP several times throughout the day.  This evening, he got readings in excess of 419 systolic.  He reports feeling a little dizzy, but denies any other complaints.  No chest pain, shortness of breath, or headache.  No visual disturbances.  The history is provided by the patient.       Home Medications Prior to Admission medications   Medication Sig Start Date End Date Taking? Authorizing Provider  amiodarone (PACERONE) 200 MG tablet 1 tablet    [provider]  amLODipine (NORVASC) 5 MG tablet Take 1 tablet by mouth daily.    [provider]  aspirin EC 81 MG tablet Take 81 mg by mouth daily.    [provider]  benazepril (LOTENSIN) 10 MG tablet 1 tablet    [provider]  blood glucose meter kit and supplies Dispense based on patient and insurance preference. Use up to four times daily as directed. (FOR ICD-10 E10.9, E11.9). 02/26/21   Noreene Larsson, NP  Evolocumab (REPATHA SURECLICK) 622 MG/ML SOAJ INJECT 140 MG INTO THE SKIN EVERY 14 DAY 09/18/21   Croitoru, Dani Gobble, MD  gabapentin (NEURONTIN) 300 MG capsule 1 capsule    [provider]  glipiZIDE (GLIPIZIDE XL) 2.5 MG 24 hr tablet Take 1 tablet (2.5 mg total) by mouth daily with breakfast. 02/17/22   Paseda, Dewaine Conger, FNP  magnesium oxide (MAG-OX) 400 MG tablet 1 tablet as needed    [provider]  meclizine (ANTIVERT) 25 MG tablet Take 1 tablet (25 mg total) by mouth 3 (three) times daily as needed for dizziness. 02/04/22   Lindell Spar, MD  meloxicam (MOBIC) 7.5 MG tablet Take 1 tablet (7.5 mg  total) by mouth daily. 06/26/21   Carole Civil, MD  metoprolol succinate (TOPROL-XL) 25 MG 24 hr tablet TAKE 1 TABLET DAILY. TAKE WITH OR IMMEDIATELY FOLLOWING A MEAL. 07/16/21   Croitoru, Mihai, MD  multivitamin-iron-minerals-folic acid (CENTRUM) chewable tablet Chew 1 tablet by mouth daily.    [provider]  OVER THE COUNTER MEDICATION Apple cider vinegar and Ginger Gummy - take 1 gummy by mouth once daily    [provider]  pantoprazole (PROTONIX) 40 MG tablet Take 1 tablet (40 mg total) by mouth daily. 10/30/20   Noreene Larsson, NP  Simethicone 125 MG TABS Take 1 tablet (125 mg total) by mouth 3 (three) times daily as needed (Bloating). 02/04/22   Lindell Spar, MD  spironolactone-hydrochlorothiazide (ALDACTAZIDE) 25-25 MG tablet TAKE 1 TABLET EVERY OTHER DAY 01/22/22   Croitoru, Mihai, MD      Allergies    Metformin and related and Statins    Review of Systems   Review of Systems  All other systems reviewed and are negative.   Physical Exam Updated Vital Signs BP (!) 164/71 (BP Location: Right Arm)   Pulse (!) 57   Temp 97.9 F (36.6 C) (Oral)   Resp 17   Ht _0  (1.676 m)   Wt 95.3 kg   SpO2 97%   BMI 33.89 kg/m  Physical Exam  Vitals and nursing note reviewed.  Constitutional:      General: He is not in acute distress.    Appearance: He is well-developed. He is not diaphoretic.  HENT:     Head: Normocephalic and atraumatic.  Eyes:     Extraocular Movements: Extraocular movements intact.     Pupils: Pupils are equal, round, and reactive to light.  Cardiovascular:     Rate and Rhythm: Normal rate and regular rhythm.     Heart sounds: No murmur heard.    No friction rub.  Pulmonary:     Effort: Pulmonary effort is normal. No respiratory distress.     Breath sounds: Normal breath sounds. No wheezing or rales.  Abdominal:     General: Bowel sounds are normal. There is no distension.     Palpations: Abdomen is soft.     Tenderness: There is  no abdominal tenderness.  Musculoskeletal:        General: Normal range of motion.     Cervical back: Normal range of motion and neck supple.  Skin:    General: Skin is warm and dry.  Neurological:     General: No focal deficit present.     Mental Status: He is alert and oriented to person, place, and time.     Cranial Nerves: No cranial nerve deficit.     Coordination: Coordination normal.     ED Results / Procedures / Treatments   Labs (all labs ordered are listed, but only abnormal results are displayed) Labs Reviewed  BASIC METABOLIC PANEL  CBC WITH DIFFERENTIAL/PLATELET    EKG EKG Interpretation  Date/Time:  Wednesday March 11 2022 01:26:47 EDT Ventricular Rate:  43 PR Interval:  215 QRS Duration: 112 QT Interval:  530 QTC Calculation: 449 R Axis:   -33 Text Interpretation: Sinus bradycardia Borderline IVCD with LAD Probable anteroseptal infarct, old Confirmed by Veryl Speak [phone]) on 03/11/2022 1:38:52 AM  Radiology No results found.  Procedures Procedures    Medications Ordered in ED Medications - No data to display  ED Course/ Medical Decision Making/ A&P  Patient presenting here with complaints of elevated blood pressure as described in the HPI.  He is neurologically intact and labs and ekg do not suggest end organ damage.  His blood pressures without intervention in the ED running in the 160-170 range and do not warrant medication.  Patient to be discharged with home monitoring of his blood pressures and follow up with his pcp.  Final Clinical Impression(s) / ED Diagnoses Final diagnoses:  None    Rx / DC Orders ED Discharge Orders     None         Veryl Speak, MD 03/11/22 [id number]

## 2022-03-12 NOTE — Progress Notes (Signed)
I connected with  Cammy Copa on 03/12/22 by a audio enabled telemedicine application and verified that I am speaking with the correct person using two identifiers.  Patient Location: Home  Provider Location: Office/Clinic  I discussed the limitations of evaluation and management by telemedicine. The patient expressed understanding and agreed to proceed.

## 2022-03-17 NOTE — Progress Notes (Signed)
  Subjective:  [name], male    DOB: 1941/01/03,  MRN: [id number]  81 y.o. male presents with preventative diabetic foot care and painful thick toenails that are difficult to trim. Pain interferes with ambulation. Aggravating factors include wearing enclosed shoe gear. Pain is relieved with periodic professional debridement..    New problem(s): None    PCP: Renee Rival, FNP and last visit was: Nov 25, 2021.  Review of Systems: Negative except as noted in the HPI.   Allergies  Allergen Reactions   Metformin And Related Diarrhea and Nausea And Vomiting   Statins Other (See Comments)    Muscle/joint pain    Objective:  There were no vitals filed for this visit. Constitutional Patient is a pleasant 81 y.o. African American male WD, WN in NAD. AAO x 3.  Vascular Capillary fill time to digits immediate b/l.  DP/PT pulse(s) are palpable b/l lower extremities. Pedal hair sparse. Lower extremity skin temperature gradient within normal limits. No pain with calf compression b/l. No edema noted b/l lower extremities. No cyanosis or clubbing noted.   Neurologic Protective sensation intact 5/5 intact bilaterally with 10g monofilament b/l. Vibratory sensation intact b/l. No clonus b/l.   Dermatologic Pedal skin is warm and supple b/l.  No open wounds b/l lower extremities. No interdigital macerations b/l lower extremities. Toenails 1-5 b/l elongated, discolored, dystrophic, thickened, crumbly with subungual debris and tenderness to dorsal palpation.  Nevus noted 4th webspace left foot, unchanged.  Orthopedic: Normal muscle strength 5/5 to all lower extremity muscle groups bilaterally. Patient ambulates independent of any assistive aids. HAV with bunion deformity noted b/l LE.      Latest Ref Rng & Units 11/28/2021    8:11 AM 08/28/2021   11:11 AM  Hemoglobin A1C  Hemoglobin-A1c 4.8 - 5.6 % 6.2  6.2    Assessment:   1. Pain due to onychomycosis of toenails of both feet   2.  Type 2 diabetes mellitus with hyperlipidemia ([place])    Plan:  Patient was evaluated and treated and all questions answered. Consent given for treatment as described below: -Examined patient. -Continue foot and shoe inspections daily. Monitor blood glucose per PCP/Endocrinologist's recommendations. -Mycotic toenails 1-5 bilaterally were debrided in length and girth with sterile nail nippers and dremel without incident. -Patient/POA to call should there be question/concern in the interim.  Return in about 3 months (around 06/10/2022).  Marzetta Board, DPM

## 2022-03-26 ENCOUNTER — Telehealth: Payer: Self-pay | Admitting: Orthopedic Surgery

## 2022-03-26 NOTE — Telephone Encounter (Signed)
Patient called at 9:47 am wanting a refill on his  meloxicam (MOBIC) 7.5 MG tablet  he did not leave the Pharmacy name.    I called the patient back and he said he doesn't need a refill now he found some in his closet, so he is ok now.

## 2022-04-09 NOTE — Progress Notes (Signed)
Cardiology Office Note Date:  04/09/2022  Patient ID:  Douglas Keller, DOB [birth year], MRN [id number] PCP:  Barton Want Rival, FNP  Cardiologist:  Dr. Sallyanne Kuster Electrophysiologist: Dr. Quentin Ore    Chief Complaint: 3 mo   History of Present Illness: Douglas Keller is a 81 y.o. male with history of DM, HTN, HLD (statin intolerant > repatha), PVCs, NICM (cath 2006 showed  non obstructive atherosclerosis (40% proximal LAD, 40% mid RCA) and had nuclear stress test in 2018 showed normal perfusion), chronic CHF (systolic), suspect 2/2 PVCs  He comes in today to be seen for Dr. Quentin Ore, last seen by him Nov 2022, he was post EPS, unfortunately finding the origin of the PVC was mapped to adjacent to the his bundle and thus was not targeted with RF Given symptoms persisted with his PVCs, started on amiodarone  More recently saw Dr. Sallyanne Kuster,  07/04/21, had been started on amiodarone and was feeling improved, better energy, back at the gym and pursuing weight loss efforts. EP follow up was in place, planned for surveillance labs, d/w the pt annual eye exams and importance of reporting new pulm symptoms  I saw him 08/27/21 He is very tired. He reports that he sleeps at best maybe 2 hours.  He can not fall asleep, no symptoms, not SOB, just can not fall asleep. His PMD has Rx trazodone, but he lives alone and worries about side effects He asks about sleep apnea, nut his problem is not waking, snoring, he is can not fall asleep He is not napping in the day, says if he sits he may drift to sleep but sleeps no more then a few minutes with these No CP, palpitations NO SOB Denies DOE, or exertional incapacities He gets quite dizzy upon standing, not otherwise, no syncope or near sycnope. He stopped the magnesium was not sure if he was to continue it or not Recommended to d/w his PMD insomnia Planned for monitor to revisit PVC burden, consider echo if improved He reported orthostatic dizziness, BP was  108/68 and his amlodipine reduced.  Monitor noted: HR 40 - 99 bpm, average 53 bpm. Rare supraventricular ectopy. Frequent ventricular ectopy, 10.1%. In d/w Dr. Quentin Ore, no changes Revisit in 94mo ER visit 03/11/22, [facility], HTN at home >200, was 164/71 at the ER, EKG was SB 43, no PVCs Labs ok  TODAY Generally feels pretty tired all of the time Remains active, cuts his grass, takes care of all of his ADLs. But intermittently has to sit down, because he feels lightheaded, dizzy, sometimes associated with nausea. No CP, no palpitations No syncope. He now has CPAP, trying to get used to it. Denies SOB   AAD Amiodarone Nov 2022 for PVCs  Past Medical History:  Diagnosis Date   Aortic valve sclerosis    Atherosclerosis of coronary artery without angina pectoris 07/05/2014   Cervical spondylosis without myelopathy 02/13/2013   Chest pain 01/29/2019   Degeneration of cervical intervertebral disc 02/13/2013   Depression    sometimes   Diabetes mellitus type 2, diet-controlled (HCC)    Elevated troponin    GERD (gastroesophageal reflux disease)    Heart murmur    HTN (hypertension) 07/06/2013   Hypercholesterolemia 08/21/2015   Hyperlipidemia 07/06/2013   Medication management 08/21/2015   Pre-diabetes    Rectal bleeding 09/21/2019   Stiffness of left shoulder joint 07/19/2013    Past Surgical History:  Procedure Laterality Date   BIOPSY  12/14/2019   Procedure: BIOPSY;  Surgeon: RManus Rudd  M, MD;  [facility];  Service: Endoscopy;;   CERVICAL SPINE SURGERY     COLONOSCOPY  07/26/2012   Procedure: COLONOSCOPY;  Surgeon: Jamesetta So, MD;  Location: [facility];  Service: Gastroenterology;  Laterality: N/A;   COLONOSCOPY WITH PROPOFOL N/A 12/14/2019   Procedure: COLONOSCOPY WITH PROPOFOL;  Surgeon: Daneil Dolin, MD;  Location: [facility];  Service: Endoscopy;  Laterality: N/A;  9:00am   KNEE ARTHROSCOPY Left    torn ligament   LUMBAR LAMINECTOMY/DECOMPRESSION  MICRODISCECTOMY Bilateral 12/27/2019   Procedure: BIATERAL LUMBAR TWO- LUMBAR THREE, LUMBAR THREE- LUMBAR FOUR, LUMBAR FOUR-FIVE LAMINOTOMY/FORAMINOTOMY 3 LEVELS;  Surgeon: Newman Pies, MD;  Location: [facility];  Service: Neurosurgery;  Laterality: Bilateral;  POSTERIOR   PVC ABLATION N/A 04/18/2021   Procedure: PVC ABLATION;  Surgeon: Vickie Epley, MD;  Location: [facility];  Service: Cardiovascular;  Laterality: N/A;   SPINE SURGERY N/A    Phreesia 02/24/2020    Current Outpatient Medications  Medication Sig Dispense Refill   amiodarone (PACERONE) 200 MG tablet 1 tablet     amLODipine (NORVASC) 5 MG tablet Take 1 tablet by mouth daily.     aspirin EC 81 MG tablet Take 81 mg by mouth daily.     benazepril (LOTENSIN) 10 MG tablet 1 tablet     blood glucose meter kit and supplies Dispense based on patient and insurance preference. Use up to four times daily as directed. (FOR ICD-10 E10.9, E11.9). 1 each 0   Evolocumab (REPATHA SURECLICK) 100 MG/ML SOAJ INJECT 140 MG INTO THE SKIN EVERY 14 DAY 6 mL 3   gabapentin (NEURONTIN) 300 MG capsule 1 capsule     glipiZIDE (GLIPIZIDE XL) 2.5 MG 24 hr tablet Take 1 tablet (2.5 mg total) by mouth daily with breakfast. 90 tablet 1   magnesium oxide (MAG-OX) 400 MG tablet 1 tablet as needed     meclizine (ANTIVERT) 25 MG tablet Take 1 tablet (25 mg total) by mouth 3 (three) times daily as needed for dizziness. 30 tablet 1   meloxicam (MOBIC) 7.5 MG tablet Take 1 tablet (7.5 mg total) by mouth daily. 90 tablet 2   metoprolol succinate (TOPROL-XL) 25 MG 24 hr tablet TAKE 1 TABLET DAILY. TAKE WITH OR IMMEDIATELY FOLLOWING A MEAL. 90 tablet 3   multivitamin-iron-minerals-folic acid (CENTRUM) chewable tablet Chew 1 tablet by mouth daily.     OVER THE COUNTER MEDICATION Apple cider vinegar and Ginger Gummy - take 1 gummy by mouth once daily     pantoprazole (PROTONIX) 40 MG tablet Take 1 tablet (40 mg total) by mouth daily. 90 tablet 2   Simethicone  125 MG TABS Take 1 tablet (125 mg total) by mouth 3 (three) times daily as needed (Bloating). 120 tablet 0   spironolactone-hydrochlorothiazide (ALDACTAZIDE) 25-25 MG tablet TAKE 1 TABLET EVERY OTHER DAY 45 tablet 0   No current facility-administered medications for this visit.    Allergies:   Metformin and related and Statins   Social History:  The patient  reports that he has never smoked. He has never been exposed to tobacco smoke. He has never used smokeless tobacco. He reports that he does not drink alcohol and does not use drugs.   Family History:  The patient's family history includes Asthma in his mother; Hypertension in his father.  ROS:  Please see the history of present illness.    All other systems are reviewed and otherwise negative.   PHYSICAL EXAM:  VS:  There were no vitals taken for this visit. BMI: There is no height or weight on file to calculate BMI. Well nourished, well developed, in no acute distress HEENT: normocephalic, atraumatic Neck: no JVD, carotid bruits or masses Cardiac:  RRR; a few extrasystoles heard during auscultation, no significant murmurs, no rubs, or gallops Lungs:  CTA b/l, no wheezing, rhonchi or rales Abd: soft, nontender MS: no deformity or atrophy Ext: no edema Skin: warm and dry, no rash Neuro:  No gross deficits appreciated Psych: euthymic mood, full affect  EKG:  Done 03/11/22 is personally reviewed SB 43bpm, 1st degree AVblock 282m   Feb 2023, monitor HR 40 - 99 bpm, average 53 bpm. Rare supraventricular ectopy. Frequent ventricular ectopy, 10.1%.  04/18/21: EPS CONCLUSIONS: 1.  Frequent PVCs, symptomatic 2.  Appears to be multifocal PVCs with today's morphology originating near the His bundle. 3.  Pharmacologic suppression is indicated.  I would not recommend repeat EP study given the multifocal nature of his PVCs and site of origin near the His-Purkinje system 4. No early apparent complications.   04/08/21: TTE 1. Frequent  PVC's. Left ventricular ejection fraction, by estimation, is  40 to 45%. The left ventricle has mildly decreased function. The left  ventricle has no regional wall motion abnormalities. The left ventricular  internal cavity size was mildly  dilated. Left ventricular diastolic parameters are consistent with Grade I  diastolic dysfunction (impaired relaxation).   2. Right ventricular systolic function is normal. The right ventricular  size is normal. There is normal pulmonary artery systolic pressure. The  estimated right ventricular systolic pressure is 372.6mmHg.   3. Left atrial size was mildly dilated.   4. The mitral valve is normal in structure. No evidence of mitral valve  regurgitation. No evidence of mitral stenosis.   5. The aortic valve is tricuspid. There is moderate calcification of the  aortic valve. There is moderate thickening of the aortic valve. Aortic  valve regurgitation is mild. Mild to moderate aortic valve  sclerosis/calcification is present, without any  evidence of aortic stenosis.   6. Pulmonic valve regurgitation is moderate.   7. The inferior vena cava is normal in size with greater than 50%  respiratory variability, suggesting right atrial pressure of 3 mmHg.   Comparison(s): Prior images reviewed side by side. The left ventricular  function is worsened.   01/2021: Monitor The dominant rhythm was normal sinus with a normal circadian rhythm variation. There are extremely frequent premature ventricular beats (representing 15.7% of all QRS complexes), with frequent ventricular couplets and rare episodes of 4-7 beat runs of nonsustained ventricular tachycardia. Although multiple PVC morphologies are seen, there is a dominant PVC morphology and occasional bigeminy and trigeminy are seen There is a remarkable reduction, almost complete abolition of ventricular ectopy during sleep Atrial ectopy is rare and there is no atrial fibrillation. A 5 beat run of atrial  tachycardia seen. Patient diary was not submitted. There is mild expected bradycardia during sleep. There are no episodes of severe bradycardia or sinus pauses.   Abnormal 14-day event monitor due to the presence of extremely frequent PVCs and ventricular couplets, occasionally in the form of very brief nonsustained VT. There is a remarkable reduction in ventricular ectopy burden during sleep.  05/17/2020: TTE IMPRESSIONS   1. Left ventricular ejection fraction, by estimation, is 55%. The left  ventricle has normal function. The left ventricle has no regional wall  motion abnormalities. There is mild left ventricular hypertrophy. Left  ventricular diastolic  parameters are  consistent with Grade I diastolic dysfunction (impaired relaxation).  Elevated left atrial pressure.   2. Right ventricular systolic function is normal. The right ventricular  size is normal.   3. The mitral valve is normal in structure. No evidence of mitral valve  regurgitation. No evidence of mitral stenosis.   4. The aortic valve is tricuspid. There is mild calcification of the  aortic valve. There is mild thickening of the aortic valve. Aortic valve  regurgitation is mild. No aortic stenosis is present.   5. The inferior vena cava is normal in size with greater than 50%  respiratory variability, suggesting right atrial pressure of 3 mmHg.    Recent Labs: 08/27/2021: Magnesium 2.1 02/27/2022: ALT 32; TSH 2.610 03/11/2022: BUN 18; Creatinine, Ser 1.29; Hemoglobin 14.3; Platelets 278; Potassium 3.9; Sodium 139  11/28/2021: Chol/HDL Ratio 2.6; Cholesterol, Total 113; HDL 43; LDL Chol Calc (NIH) 52; Triglycerides 95   CrCl cannot be calculated (Patient's most recent lab result is older than the maximum 21 days allowed.).   Wt Readings from Last 3 Encounters:  03/10/22 210 lb (95.3 kg)  02/04/22 212 lb (96.2 kg)  11/25/21 209 lb (94.8 kg)     Other studies reviewed: Additional studies/records reviewed today  include: summarized above  ASSESSMENT AND PLAN:  PVCs EPS as above, unable to ablate Amiodarone started Nov 2022 True SB by his EKG in the ER last month, fatigue, lightheaded D/w Dr. Quentin Ore, will stop his Toprol Amio labs are OK and up to date  NICM Chronic CHF (systolic) LVEF 86-82% by echo in Sept No symptoms or exam findings of volume OL He mentions bloated, saw his PMD, had US/MRI abdomen done, so noted evidence of congestion Exam does not appear volume OL  4. HTN Increase his amlodipine to 23m daily, given stopping his Toprol  5. Orthostatic dizziness Now with bradycardia as well to the 40's without PVCs Will stop Metoprolol   Disposition:he is due to see Dr. CSallyanne Kuster will have him catch up with him/cards APP in the next few weeks, we can see him for EP back in 3 mo, sooner if needed   Current medicines are reviewed at length with the patient today.  The patient did not have any concerns regarding medicines.  SVenetia Night PA-C 04/09/2022 6:24 PM     [place] [phone])  [phone])

## 2022-04-10 ENCOUNTER — Encounter: Payer: Self-pay | Admitting: Physician Assistant

## 2022-04-10 ENCOUNTER — Ambulatory Visit: Payer: PRIVATE HEALTH INSURANCE | Attending: Physician Assistant | Admitting: Physician Assistant

## 2022-04-10 VITALS — BP 162/74 | HR 53 | Ht 66.0 in | Wt 213.8 lb

## 2022-04-10 DIAGNOSIS — I10 Essential (primary) hypertension: Secondary | ICD-10-CM

## 2022-04-10 DIAGNOSIS — I5022 Chronic systolic (congestive) heart failure: Secondary | ICD-10-CM

## 2022-04-10 DIAGNOSIS — R001 Bradycardia, unspecified: Secondary | ICD-10-CM

## 2022-04-10 DIAGNOSIS — I428 Other cardiomyopathies: Secondary | ICD-10-CM | POA: Diagnosis not present

## 2022-04-10 DIAGNOSIS — I493 Ventricular premature depolarization: Secondary | ICD-10-CM | POA: Diagnosis not present

## 2022-04-10 MED ORDER — AMLODIPINE BESYLATE 10 MG PO TABS: 10.0000 mg | ORAL_TABLET | Freq: Every day | ORAL | 1 refills | Status: DC

## 2022-04-10 NOTE — Patient Instructions (Addendum)
Medication Instructions:   START TAKING: AMLODIPINE 10 MG ONCE A DAY   STOP TAKING AND REMOVE THIS MEDICATION FROM YOUR MEDICATION LIST:  METOPROLOL    *If you need a refill on your cardiac medications before your next appointment, please call your pharmacy*   Lab Work: NONE ORDERED  TODAY   If you have labs (blood work) drawn today and your tests are completely normal, you will receive your results only by: [place] (if you have MyChart) OR A paper copy in the mail If you have any lab test that is abnormal or we need to change your treatment, we will call you to review the results.   Testing/Procedures: NONE ORDERED  TODAY    Follow-Up: At [facility], you and your health needs are our priority.  As part of our continuing mission to provide you with exceptional heart care, we have created designated Provider Care Teams.  These Care Teams include your primary Cardiologist (physician) and Advanced Practice Providers (APPs -  Physician Assistants and Nurse Practitioners) who all work together to provide you with the care you need, when you need it.  We recommend signing up for the patient portal called "MyChart".  Sign up information is provided on this After Visit Summary.  MyChart is used to connect with patients for Virtual Visits (Telemedicine).  Patients are able to view lab/test results, encounter notes, upcoming appointments, etc.  Non-urgent messages can be sent to your provider as well.   To learn more about what you can do with MyChart, go to [email or web address].    Your next appointment:  Dr. Loletha Grayer / APP NEXT AVAILABLE   3-4  month(s)  The format for your next appointment:   In Person  Provider:   You may see Vickie Epley, MD or one of the following Advanced Practice Providers on your designated Care Team:   Tommye Standard, [place] Legrand Como "Jonni Sanger" Chalmers Cater, [place]   Other Instructions   Important Information About Sugar

## 2022-04-27 ENCOUNTER — Encounter: Payer: Medicare HMO | Admitting: Nurse Practitioner

## 2022-04-28 ENCOUNTER — Encounter: Payer: Medicare HMO | Admitting: Nurse Practitioner

## 2022-04-28 ENCOUNTER — Telehealth: Payer: Self-pay | Admitting: Internal Medicine

## 2022-04-28 ENCOUNTER — Encounter: Payer: Self-pay | Admitting: Internal Medicine

## 2022-04-28 ENCOUNTER — Ambulatory Visit (INDEPENDENT_AMBULATORY_CARE_PROVIDER_SITE_OTHER): Payer: Medicare HMO | Admitting: Internal Medicine

## 2022-04-28 VITALS — BP 134/78 | HR 68 | Ht 66.0 in | Wt 217.0 lb

## 2022-04-28 DIAGNOSIS — Z23 Encounter for immunization: Secondary | ICD-10-CM

## 2022-04-28 DIAGNOSIS — Z0001 Encounter for general adult medical examination with abnormal findings: Secondary | ICD-10-CM

## 2022-04-28 NOTE — Patient Instructions (Signed)
It was a pleasure to see you today.  Thank you for giving [place] the opportunity to be involved in your care.  Below is a brief recap of your visit and next steps.  We will plan to see you again in 3 months  Summary We completed your annual physical today. Recent lab work looks great. You will receive your flu shot today.  Next steps Follow up in 3 months. Please let us know if you need anything.

## 2022-04-28 NOTE — Telephone Encounter (Signed)
Pt called back stating he failed to mention he is needing to get his handicapped sticker renewed for 5 yrs. Can you please renew?       (Sleeved/copied/filed)

## 2022-04-28 NOTE — Assessment & Plan Note (Signed)
Presenting today for his annual physical exam.  Recent records and lab work reviewed. -Influenza vaccine administered today -He states that he received his most recent COVID-19 booster vaccine in August.  He will bring documentation to his next appointment. -Additional preventative care items are up-to-date. -Follow-up in 3 months

## 2022-04-28 NOTE — Progress Notes (Signed)
Complete physical exam  Patient: Douglas Keller   DOB: 22-Aug-1940   81 y.o. Male  MRN: [id number]  Subjective:    Chief Complaint  Patient presents with   Annual Exam    Douglas Keller is a 81 y.o. male who presents today for a complete physical exam. He reports consuming a general diet. The patient does not participate in regular exercise at present. He generally feels fairly well. He reports sleeping well. He does not have additional problems to discuss today.    Most recent fall risk assessment:    04/28/2022   10:11 AM  Fall Risk   Falls in the past year? 0  Number falls in past yr: 0  Injury with Fall? 0  Risk for fall due to : No Fall Risks  Follow up Falls evaluation completed     Most recent depression screenings:    04/28/2022   10:12 AM 02/04/2022   11:22 AM  PHQ 2/9 Scores  PHQ - 2 Score 5 0  PHQ- 9 Score 8     Vision:Within last year and Dental: No current dental problems  Past Medical History:  Diagnosis Date   Aortic valve sclerosis    Atherosclerosis of coronary artery without angina pectoris 07/05/2014   Cervical spondylosis without myelopathy 02/13/2013   Chest pain 01/29/2019   Degeneration of cervical intervertebral disc 02/13/2013   Depression    sometimes   Diabetes mellitus type 2, diet-controlled (HCC)    Elevated troponin    GERD (gastroesophageal reflux disease)    Heart murmur    HTN (hypertension) 07/06/2013   Hypercholesterolemia 08/21/2015   Hyperlipidemia 07/06/2013   Medication management 08/21/2015   Pre-diabetes    Rectal bleeding 09/21/2019   Stiffness of left shoulder joint 07/19/2013   Past Surgical History:  Procedure Laterality Date   BIOPSY  12/14/2019   Procedure: BIOPSY;  Surgeon: Daneil Dolin, MD;  Location: [facility];  Service: Endoscopy;;   CERVICAL SPINE SURGERY     COLONOSCOPY  07/26/2012   Procedure: COLONOSCOPY;  Surgeon: Jamesetta So, MD;  Location: [facility];  Service: Gastroenterology;  Laterality:  N/A;   COLONOSCOPY WITH PROPOFOL N/A 12/14/2019   Procedure: COLONOSCOPY WITH PROPOFOL;  Surgeon: Daneil Dolin, MD;  Location: [facility];  Service: Endoscopy;  Laterality: N/A;  9:00am   KNEE ARTHROSCOPY Left    torn ligament   LUMBAR LAMINECTOMY/DECOMPRESSION MICRODISCECTOMY Bilateral 12/27/2019   Procedure: BIATERAL LUMBAR TWO- LUMBAR THREE, LUMBAR THREE- LUMBAR FOUR, LUMBAR FOUR-FIVE LAMINOTOMY/FORAMINOTOMY 3 LEVELS;  Surgeon: Newman Pies, MD;  Location: [facility];  Service: Neurosurgery;  Laterality: Bilateral;  POSTERIOR   PVC ABLATION N/A 04/18/2021   Procedure: PVC ABLATION;  Surgeon: Vickie Epley, MD;  Location: [facility];  Service: Cardiovascular;  Laterality: N/A;   SPINE SURGERY N/A    Phreesia 02/24/2020   Social History   Tobacco Use   Smoking status: Never    Passive exposure: Never   Smokeless tobacco: Never  Vaping Use   Vaping Use: Never used  Substance Use Topics   Alcohol use: No   Drug use: No   Family History  Problem Relation Age of Onset   Asthma Mother    Hypertension Father    Colon cancer Neg Hx    Allergies  Allergen Reactions   Metformin And Related Diarrhea and Nausea And Vomiting   Statins Other (See Comments)    Muscle/joint pain       Patient Care  Team: Johnette Abraham, MD as PCP - General (Internal Medicine) Sanda Klein, MD as PCP - Cardiology (Cardiology) Vickie Epley, MD as PCP - Electrophysiology (Cardiology) Daneil Dolin, MD as Consulting Physician (Gastroenterology)   Outpatient Medications Prior to Visit  Medication Sig   amiodarone (PACERONE) 200 MG tablet 1 tablet   amLODipine (NORVASC) 10 MG tablet Take 1 tablet (10 mg total) by mouth daily.   aspirin EC 81 MG tablet Take 81 mg by mouth daily.   benazepril (LOTENSIN) 10 MG tablet 1 tablet   blood glucose meter kit and supplies Dispense based on patient and insurance preference. Use up to four times daily as directed. (FOR ICD-10 E10.9,  E11.9).   Evolocumab (REPATHA SURECLICK) 403 MG/ML SOAJ INJECT 140 MG INTO THE SKIN EVERY 14 DAY   gabapentin (NEURONTIN) 300 MG capsule 1 capsule   glipiZIDE (GLIPIZIDE XL) 2.5 MG 24 hr tablet Take 1 tablet (2.5 mg total) by mouth daily with breakfast.   magnesium oxide (MAG-OX) 400 MG tablet 1 tablet as needed   meclizine (ANTIVERT) 25 MG tablet Take 1 tablet (25 mg total) by mouth 3 (three) times daily as needed for dizziness.   multivitamin-iron-minerals-folic acid (CENTRUM) chewable tablet Chew 1 tablet by mouth daily.   OVER THE COUNTER MEDICATION Apple cider vinegar and Ginger Gummy - take 1 gummy by mouth once daily   spironolactone-hydrochlorothiazide (ALDACTAZIDE) 25-25 MG tablet TAKE 1 TABLET EVERY OTHER DAY   [DISCONTINUED] BOOSTRIX 5-2.5-18.5 LF-MCG/0.5 injection    [DISCONTINUED] MODERNA COVID-19 BIVALENT 50 MCG/0.5ML injection    [DISCONTINUED] PREVNAR 20 0.5 ML injection    No facility-administered medications prior to visit.    Review of Systems  Constitutional:  Negative for chills and fever.  HENT:  Negative for sore throat.   Respiratory:  Negative for cough and shortness of breath.   Cardiovascular:  Negative for chest pain, palpitations and leg swelling.  Gastrointestinal:  Negative for abdominal pain, blood in stool, constipation, diarrhea, nausea and vomiting.  Genitourinary:  Negative for dysuria and hematuria.  Musculoskeletal:  Positive for joint pain (bilateral knee pain). Negative for myalgias.  Skin:  Negative for itching and rash.  Neurological:  Negative for dizziness and headaches.  Psychiatric/Behavioral:  Negative for depression and suicidal ideas.       Objective:     BP 134/78   Pulse 68   Ht 5' 6"  (1.676 m)   Wt 217 lb (98.4 kg)   SpO2 90%   BMI 35.02 kg/m  BP Readings from Last 3 Encounters:  04/28/22 134/78  04/10/22 (!) 162/74  03/11/22 (!) 175/74   Physical Exam Vitals reviewed.  Constitutional:      General: He is not in acute  distress.    Appearance: Normal appearance. He is obese. He is not ill-appearing.  HENT:     Head: Normocephalic and atraumatic.     Nose: Nose normal. No congestion or rhinorrhea.     Mouth/Throat:     Mouth: Mucous membranes are moist.     Pharynx: Oropharynx is clear.  Eyes:     Extraocular Movements: Extraocular movements intact.     Conjunctiva/sclera: Conjunctivae normal.     Pupils: Pupils are equal, round, and reactive to light.  Cardiovascular:     Rate and Rhythm: Normal rate and regular rhythm.     Pulses: Normal pulses.     Heart sounds: Murmur heard.  Pulmonary:     Effort: Pulmonary effort is normal.     Breath  sounds: Normal breath sounds. No wheezing, rhonchi or rales.  Abdominal:     General: Abdomen is flat. Bowel sounds are normal. There is no distension.     Palpations: Abdomen is soft.     Tenderness: There is no abdominal tenderness.  Musculoskeletal:        General: Tenderness (medial joint line b/l knees) present. No swelling or deformity.     Cervical back: Normal range of motion.  Skin:    General: Skin is warm and dry.     Capillary Refill: Capillary refill takes less than 2 seconds.  Neurological:     General: No focal deficit present.     Mental Status: He is alert and oriented to person, place, and time.     Motor: No weakness.  Psychiatric:        Mood and Affect: Mood normal.        Behavior: Behavior normal.        Thought Content: Thought content normal.     Last CBC Lab Results  Component Value Date   WBC 8.4 03/11/2022   HGB 14.3 03/11/2022   HCT 43.0 03/11/2022   MCV 93.1 03/11/2022   MCH 31.0 03/11/2022   RDW 13.2 03/11/2022   PLT 278 41/93/7902   Last metabolic panel Lab Results  Component Value Date   GLUCOSE 108 (H) 03/11/2022   NA 139 03/11/2022   K 3.9 03/11/2022   CL 108 03/11/2022   CO2 24 03/11/2022   BUN 18 03/11/2022   CREATININE 1.29 (H) 03/11/2022   GFRNONAA 56 (L) 03/11/2022   CALCIUM 9.0 03/11/2022    PROT 7.0 02/27/2022   ALBUMIN 4.1 02/27/2022   LABGLOB 2.9 02/27/2022   AGRATIO 1.4 02/27/2022   BILITOT 0.5 02/27/2022   ALKPHOS 89 02/27/2022   AST 25 02/27/2022   ALT 32 02/27/2022   ANIONGAP 7 03/11/2022   Last lipids Lab Results  Component Value Date   CHOL 113 11/28/2021   HDL 43 11/28/2021   LDLCALC 52 11/28/2021   TRIG 95 11/28/2021   CHOLHDL 2.6 11/28/2021   Last hemoglobin A1c Lab Results  Component Value Date   HGBA1C 6.2 (H) 11/28/2021   Last thyroid functions Lab Results  Component Value Date   TSH 2.610 02/27/2022   Last vitamin D Lab Results  Component Value Date   VD25OH 40.6 02/25/2021   Last vitamin B12 and Folate Lab Results  Component Value Date   VITAMINB12 322 01/29/2019      Assessment & Plan:    Routine Health Maintenance and Physical Exam  Immunization History  Administered Date(s) Administered   Fluad Quad(high Dose 65+) 03/20/2019, 05/03/2020   Influenza-Unspecified 04/19/2014, 05/20/2021, 03/17/2022   Moderna Covid-19 Vaccine Bivalent Booster 62yr & up 10/31/2020   Moderna Sars-Covid-2 Vaccination 08/11/2019, 09/11/2019, 05/15/2020, 12/26/2021   PNEUMOCOCCAL CONJUGATE-20 12/26/2021   Pneumococcal Polysaccharide-23 11/29/2019   Pneumococcal-Unspecified 12/26/2021   Td 04/21/2011, 12/26/2021   Zoster Recombinat (Shingrix) 04/15/2020, 08/19/2020    Health Maintenance  Topic Date Due   COVID-19 Vaccine (6 - Moderna risk series) 02/20/2022   HEMOGLOBIN A1C  05/31/2022   FOOT EXAM  09/02/2022   OPHTHALMOLOGY EXAM  09/16/2022   Diabetic kidney evaluation - Urine ACR  11/29/2022   Diabetic kidney evaluation - GFR measurement  03/12/2023   TETANUS/TDAP  12/27/2031   Pneumonia Vaccine 80 Years old  Completed   INFLUENZA VACCINE  Completed   Zoster Vaccines- Shingrix  Completed   HPV VACCINES  Aged Out  Discussed health benefits of physical activity, and encouraged him to engage in regular exercise appropriate for his age  and condition.  Problem List Items Addressed This Visit       Encounter for annual general medical examination with abnormal findings in adult    Presenting today for his annual physical exam.  Recent records and lab work reviewed. -Influenza vaccine administered today -He states that he received his most recent COVID-19 booster vaccine in August.  He will bring documentation to his next appointment. -Additional preventative care items are up-to-date. -Follow-up in 3 months      Return in about 3 months (around 07/29/2022).     Johnette Abraham, MD

## 2022-05-15 ENCOUNTER — Other Ambulatory Visit: Payer: Self-pay | Admitting: Physician Assistant

## 2022-05-18 DIAGNOSIS — M48062 Spinal stenosis, lumbar region with neurogenic claudication: Secondary | ICD-10-CM | POA: Diagnosis not present

## 2022-05-18 DIAGNOSIS — M542 Cervicalgia: Secondary | ICD-10-CM | POA: Diagnosis not present

## 2022-05-26 DIAGNOSIS — M48062 Spinal stenosis, lumbar region with neurogenic claudication: Secondary | ICD-10-CM | POA: Diagnosis not present

## 2022-06-04 DIAGNOSIS — M170 Bilateral primary osteoarthritis of knee: Secondary | ICD-10-CM | POA: Diagnosis not present

## 2022-06-04 DIAGNOSIS — M25562 Pain in left knee: Secondary | ICD-10-CM | POA: Diagnosis not present

## 2022-06-04 DIAGNOSIS — M25561 Pain in right knee: Secondary | ICD-10-CM | POA: Diagnosis not present

## 2022-06-15 ENCOUNTER — Telehealth: Payer: Self-pay | Admitting: Cardiovascular Disease

## 2022-06-15 NOTE — Telephone Encounter (Signed)
Returned call to patient and he states his BP was low this morning.  He took it several different times.  The last time he took it his BP was 132/56.  He did stop his Metoprolol as advised and is feeling good.  He is going to keep a log of his BP's and report back to [place] and let us know if his BP gets any lower.

## 2022-06-15 NOTE — Telephone Encounter (Signed)
Pt c/o BP issue: STAT if pt c/o blurred vision, one-sided weakness or slurred speech  1. What are your last 5 BP readings? This morning - 113/40 then later on today his L Arm was 130/48 and R Arm was 132/56  2. Are you having any other symptoms (ex. Dizziness, headache, blurred vision, passed out)? Fatigue - denies all other symptoms  3. What is your BP issue? Patient wants to know if his BP is getting too low. Please advise.

## 2022-06-23 ENCOUNTER — Encounter: Payer: Self-pay | Admitting: Podiatry

## 2022-06-23 ENCOUNTER — Ambulatory Visit: Payer: Medicare HMO | Admitting: Podiatry

## 2022-06-23 VITALS — BP 136/72

## 2022-06-23 DIAGNOSIS — E1169 Type 2 diabetes mellitus with other specified complication: Secondary | ICD-10-CM

## 2022-06-23 DIAGNOSIS — E785 Hyperlipidemia, unspecified: Secondary | ICD-10-CM

## 2022-06-23 DIAGNOSIS — M79675 Pain in left toe(s): Secondary | ICD-10-CM

## 2022-06-23 DIAGNOSIS — M79674 Pain in right toe(s): Secondary | ICD-10-CM

## 2022-06-23 DIAGNOSIS — B351 Tinea unguium: Secondary | ICD-10-CM | POA: Diagnosis not present

## 2022-06-23 NOTE — Progress Notes (Signed)
  Subjective:  [name], male    DOB: 01-28-1941,  MRN: [id number]  Douglas Keller presents to clinic today for preventative diabetic foot care and painful thick toenails that are difficult to trim. Pain interferes with ambulation. Aggravating factors include wearing enclosed shoe gear. Pain is relieved with periodic professional debridement.  Chief Complaint  Patient presents with   Nail Problem    Diabetic foot care BS-did not check today A1C-do not know PCP-Phillip Dixion PCP [facility]-2 months ago   New problem(s): None.   PCP is Johnette Abraham, MD.  Allergies  Allergen Reactions   Metformin And Related Diarrhea and Nausea And Vomiting   Statins Other (See Comments)    Muscle/joint pain    Review of Systems: Negative except as noted in the HPI.  Objective: No changes noted in today's physical examination. Vitals:   06/23/22 1019  BP: 136/72   Douglas Keller is a pleasant 81 y.o. male in NAD. AAO x 3.  Vascular Capillary fill time to digits immediate b/l.  DP/PT pulse(s) are palpable b/l lower extremities. Pedal hair sparse. Lower extremity skin temperature gradient within normal limits. No pain with calf compression b/l. No edema noted b/l lower extremities. No cyanosis or clubbing noted.   Neurologic Protective sensation intact 5/5 intact bilaterally with 10g monofilament b/l. Vibratory sensation intact b/l. No clonus b/l.   Dermatologic Pedal skin is warm and supple b/l.  No open wounds b/l lower extremities. No interdigital macerations b/l lower extremities. Toenails 1-5 b/l elongated, discolored, dystrophic, thickened, crumbly with subungual debris and tenderness to dorsal palpation. Nevus noted 4th webspace left foot, unchanged.  Orthopedic: Normal muscle strength 5/5 to all lower extremity muscle groups bilaterally. Patient ambulates independent of any assistive aids. HAV with bunion deformity noted b/l LE.   Assessment/Plan: 1. Pain due to onychomycosis  of toenails of both feet   2. Type 2 diabetes mellitus with hyperlipidemia (HCC)     No orders of the defined types were placed in this encounter.   -Consent given for treatment as described below: -Continue supportive shoe gear daily. -Toenails 1-5 b/l were debrided in length and girth with sterile nail nippers and dremel without iatrogenic bleeding.  -Patient/POA to call should there be question/concern in the interim.   Return in about 3 months (around 09/22/2022).  Marzetta Board, DPM

## 2022-07-12 ENCOUNTER — Other Ambulatory Visit: Payer: Self-pay | Admitting: Nurse Practitioner

## 2022-07-12 ENCOUNTER — Other Ambulatory Visit: Payer: Self-pay | Admitting: Physician Assistant

## 2022-07-14 ENCOUNTER — Telehealth: Payer: Self-pay

## 2022-07-14 MED ORDER — AMIODARONE HCL 200 MG PO TABS: 200.0000 mg | ORAL_TABLET | Freq: Every day | ORAL | 2 refills | Status: DC

## 2022-07-14 NOTE — Telephone Encounter (Signed)
   Pre-operative Risk Assessment    Patient Name: Douglas Keller  DOB: 08-Oct-1940 MRN: [id number]      Request for Surgical Clearance    Procedure:   Right Total Knee Arthroplasty  Date of Surgery:  Clearance TBD                                 Surgeon:  Augustina Mood, MD  Surgeon's Group or Practice Name:  [facility] Phone number:  [phone] Fax number:  [phone]   Type of Clearance Requested:   - Medical    Type of Anesthesia:  Spinal   Additional requests/questions:  Please advise surgeon/provider what medications should be held.  Signed, Rayven Hendrickson C Maryori Weide   07/14/2022, 9:22 AM

## 2022-07-14 NOTE — Telephone Encounter (Signed)
   Name: Douglas Keller  DOB: 1940/12/25  MRN: [id number]  Primary Cardiologist: Sanda Klein, MD  Chart reviewed as part of pre-operative protocol coverage. The patient has an upcoming visit scheduled with Dr. Sallyanne Kuster on 07/27/2022, at which time clearance can be addressed in case there are any issues that would impact surgical recommendations.   Right total knee arthroplasty is not scheduled until TBD. I added preop FYI to appointment note so that provider is aware to address at time of outpatient visit.  Per office protocol the cardiology provider should forward their finalized clearance decision and recommendations regarding antiplatelet therapy to the requesting party below.    I will route this message as FYI to requesting party and remove this message from the preop box as separate preop APP input not needed at this time.   Please call with any questions.  Trudi Ida, NP  07/14/2022, 12:09 PM

## 2022-07-24 ENCOUNTER — Encounter: Payer: Self-pay | Admitting: Internal Medicine

## 2022-07-24 ENCOUNTER — Ambulatory Visit (INDEPENDENT_AMBULATORY_CARE_PROVIDER_SITE_OTHER): Payer: Medicare HMO | Admitting: Internal Medicine

## 2022-07-24 VITALS — BP 138/82 | HR 67 | Ht 66.0 in | Wt 210.8 lb

## 2022-07-24 DIAGNOSIS — M1711 Unilateral primary osteoarthritis, right knee: Secondary | ICD-10-CM

## 2022-07-24 DIAGNOSIS — E785 Hyperlipidemia, unspecified: Secondary | ICD-10-CM | POA: Diagnosis not present

## 2022-07-24 DIAGNOSIS — I10 Essential (primary) hypertension: Secondary | ICD-10-CM | POA: Diagnosis not present

## 2022-07-24 DIAGNOSIS — E7800 Pure hypercholesterolemia, unspecified: Secondary | ICD-10-CM

## 2022-07-24 DIAGNOSIS — G4733 Obstructive sleep apnea (adult) (pediatric): Secondary | ICD-10-CM

## 2022-07-24 DIAGNOSIS — E1169 Type 2 diabetes mellitus with other specified complication: Secondary | ICD-10-CM | POA: Diagnosis not present

## 2022-07-24 DIAGNOSIS — E669 Obesity, unspecified: Secondary | ICD-10-CM | POA: Diagnosis not present

## 2022-07-24 DIAGNOSIS — Z0001 Encounter for general adult medical examination with abnormal findings: Secondary | ICD-10-CM | POA: Diagnosis not present

## 2022-07-24 NOTE — Progress Notes (Unsigned)
   Established Patient Office Visit  Subjective   Patient ID: Hadi Dubin, male    DOB: March 22, 1941  Age: 82 y.o. MRN: [id number]  Chief Complaint  Patient presents with   Medical Clearance    HPI  {History (Optional):23778}  ROS    Objective:     BP (!) 143/79   Pulse 67   Ht '5\' 6"'$  (1.676 m)   Wt 210 lb 12.8 oz (95.6 kg)   SpO2 94%   BMI 34.02 kg/m  {Vitals History (Optional):23777}  Physical Exam   No results found for any visits on 07/24/22.  {Labs (Optional):23779}  The ASCVD Risk score (Arnett DK, et al., 2019) failed to calculate for the following reasons:   The 2019 ASCVD risk score is only valid for ages 51 to 46    Assessment & Plan:   Problem List Items Addressed This Visit   None   No follow-ups on file.    Johnette Abraham, MD

## 2022-07-24 NOTE — Patient Instructions (Signed)
It was a pleasure to see you today.  Thank you for giving [place] the opportunity to be involved in your care.  Below is a brief recap of your visit and next steps.  We will plan to see you again in 3 months.  Summary No medication changes today We will repeat labs and get an ECG as part of surgical clearance Follow up in 3 months

## 2022-07-25 ENCOUNTER — Other Ambulatory Visit: Payer: Self-pay | Admitting: Cardiovascular Disease

## 2022-07-25 LAB — CMP14+EGFR
ALT: 56 IU/L — ABNORMAL HIGH (ref 0–44)
AST: 26 IU/L (ref 0–40)
Albumin/Globulin Ratio: 1.5 (ref 1.2–2.2)
Albumin: 4.3 g/dL (ref 3.8–4.8)
Alkaline Phosphatase: 100 IU/L (ref 44–121)
BUN/Creatinine Ratio: 12 (ref 10–24)
BUN: 14 mg/dL (ref 8–27)
Bilirubin Total: 0.5 mg/dL (ref 0.0–1.2)
CO2: 22 mmol/L (ref 20–29)
Calcium: 8.8 mg/dL (ref 8.6–10.2)
Chloride: 104 mmol/L (ref 96–106)
Creatinine, Ser: 1.2 mg/dL (ref 0.76–1.27)
Globulin, Total: 2.8 g/dL (ref 1.5–4.5)
Glucose: 77 mg/dL (ref 70–99)
Potassium: 4.1 mmol/L (ref 3.5–5.2)
Sodium: 141 mmol/L (ref 134–144)
Total Protein: 7.1 g/dL (ref 6.0–8.5)
eGFR: 61 mL/min/{1.73_m2} (ref >59)

## 2022-07-25 LAB — CBC WITH DIFFERENTIAL/PLATELET
Basophils Absolute: 0 10*3/uL (ref 0.0–0.2)
Basos: 1 %
EOS (ABSOLUTE): 0.1 10*3/uL (ref 0.0–0.4)
Eos: 1 %
Hematocrit: 43.7 % (ref 37.5–51.0)
Hemoglobin: 14.7 g/dL (ref 13.0–17.7)
Immature Grans (Abs): 0 10*3/uL (ref 0.0–0.1)
Immature Granulocytes: 0 %
Lymphocytes Absolute: 2.1 10*3/uL (ref 0.7–3.1)
Lymphs: 25 %
MCH: 30.9 pg (ref 26.6–33.0)
MCHC: 33.6 g/dL (ref 31.5–35.7)
MCV: 92 fL (ref 79–97)
Monocytes Absolute: 0.9 10*3/uL (ref 0.1–0.9)
Monocytes: 10 %
Neutrophils Absolute: 5.2 10*3/uL (ref 1.4–7.0)
Neutrophils: 63 %
Platelets: 309 10*3/uL (ref 150–450)
RBC: 4.76 x10E6/uL (ref 4.14–5.80)
RDW: 13.4 % (ref 11.6–15.4)
WBC: 8.3 10*3/uL (ref 3.4–10.8)

## 2022-07-25 LAB — LIPID PANEL
Chol/HDL Ratio: 3.5 ratio (ref 0.0–5.0)
Cholesterol, Total: 152 mg/dL (ref 100–199)
HDL: 43 mg/dL (ref >39)
LDL Chol Calc (NIH): 72 mg/dL (ref 0–99)
Triglycerides: 224 mg/dL — ABNORMAL HIGH (ref 0–149)
VLDL Cholesterol Cal: 37 mg/dL (ref 5–40)

## 2022-07-25 LAB — TSH+FREE T4
Free T4: 1.27 ng/dL (ref 0.82–1.77)
TSH: 1.77 u[IU]/mL (ref 0.450–4.500)

## 2022-07-25 LAB — B12 AND FOLATE PANEL
Folate: 16.1 ng/mL (ref >3.0)
Vitamin B-12: 559 pg/mL (ref 232–1245)

## 2022-07-25 LAB — HEMOGLOBIN A1C
Est. average glucose Bld gHb Est-mCnc: 131 mg/dL
Hgb A1c MFr Bld: 6.2 % — ABNORMAL HIGH (ref 4.8–5.6)

## 2022-07-25 LAB — VITAMIN D 25 HYDROXY (VIT D DEFICIENCY, FRACTURES): Vit D, 25-Hydroxy: 21 ng/mL — ABNORMAL LOW (ref 30.0–100.0)

## 2022-07-27 ENCOUNTER — Ambulatory Visit: Payer: Medicare HMO | Attending: Cardiovascular Disease | Admitting: Cardiovascular Disease

## 2022-07-27 ENCOUNTER — Other Ambulatory Visit: Payer: Self-pay | Admitting: Cardiovascular Disease

## 2022-07-27 ENCOUNTER — Encounter: Payer: Self-pay | Admitting: Cardiovascular Disease

## 2022-07-27 ENCOUNTER — Ambulatory Visit: Payer: PRIVATE HEALTH INSURANCE | Attending: Cardiovascular Disease

## 2022-07-27 VITALS — BP 133/68 | HR 59 | Ht 66.0 in | Wt 210.0 lb

## 2022-07-27 DIAGNOSIS — E669 Obesity, unspecified: Secondary | ICD-10-CM

## 2022-07-27 DIAGNOSIS — I493 Ventricular premature depolarization: Secondary | ICD-10-CM

## 2022-07-27 DIAGNOSIS — Z01810 Encounter for preprocedural cardiovascular examination: Secondary | ICD-10-CM

## 2022-07-27 DIAGNOSIS — Z5181 Encounter for therapeutic drug level monitoring: Secondary | ICD-10-CM | POA: Diagnosis not present

## 2022-07-27 DIAGNOSIS — Z79899 Other long term (current) drug therapy: Secondary | ICD-10-CM | POA: Diagnosis not present

## 2022-07-27 DIAGNOSIS — E1169 Type 2 diabetes mellitus with other specified complication: Secondary | ICD-10-CM

## 2022-07-27 DIAGNOSIS — E7801 Familial hypercholesterolemia: Secondary | ICD-10-CM

## 2022-07-27 DIAGNOSIS — I5042 Chronic combined systolic (congestive) and diastolic (congestive) heart failure: Secondary | ICD-10-CM | POA: Diagnosis not present

## 2022-07-27 DIAGNOSIS — I10 Essential (primary) hypertension: Secondary | ICD-10-CM

## 2022-07-27 DIAGNOSIS — I2510 Atherosclerotic heart disease of native coronary artery without angina pectoris: Secondary | ICD-10-CM

## 2022-07-27 NOTE — Progress Notes (Unsigned)
Enrolled for [employer] to mail a ZIO XT long term holter monitor to the patients address on file.  

## 2022-07-27 NOTE — Progress Notes (Signed)
[name], male   DOB: 02-25-1941, 82 y.o.   MRN: [id number]    Cardiology Office Note    Date:  07/27/2022   ID:  Douglas Keller, DOB Mar 10, 1941, MRN [id number]  PCP:  Johnette Abraham, MD  Cardiologist:   Sanda Klein, MD   Chief Complaint  Patient presents with   Pre-op Exam     History of Present Illness:  Douglas Keller is a 82 y.o. male with hyperlipidemia, hypertension, moderate coronary atherosclerosis and a murmur due to aortic valve sclerosis without stenosis.  Since he started taking amiodarone his heart rate is "faster".  This is likely due to abolition of the episodes of ventricular bigeminy that was causing effective bradycardia down to the 40s (on the monitor 16% of all beats were PVCs, bigeminy, trigeminy, brief NSVT).  Beta-blockers did not help and an attempt at RF ablation was aborted since the PVCs were located very close to the His bundle.  He has been on amiodarone now since November 2022.  On a follow-up event monitor in February 2023 he still had 10% PVCs.  He is subjectively better and is exercising again.  The biggest limitation to his physical activity is pain in his right knee.  He is planning to undergo knee replacement with Dr. Berenice Primas.  He still has occasional episodes of "heat" (it sounds like he is describing a sensation of weakness and flushing) that occur randomly, independent of physical activity, meals or time of day.  He has not had syncope.  He denies chest pain or shortness of breath with activity, orthopnea, PND, lower extremity edema, focal neurological complaints, claudication.   He did not tolerate treatment with chlorthalidone due to severe muscle cramps, but is doing well on the spironolactone-hydrochlorothiazide combination.  Treatment with furosemide was discontinued due to worsening renal function.    An event monitor in November 2018 showed relative bradycardia and a suggestion of chronotropic incompetence.  He did not have any  symptoms and a pacemaker was not necessary.  His echocardiogram in 2020 showed borderline LVEF of 45-50% (similar to EF 48% by nuclear stress test in 2016) and impaired relaxation.  Another arrhythmia monitor performed in July 2022 showed fairly frequent PVCs representing 16% of all QRS complexes, often causing effective bradycardia during periods of bigeminy.  He was intolerant of multiple statins even when we tried to supplement coenzyme Q 10.  He is now on Repatha and tolerating it very well, with excellent LDL level.  Cardiac catheterization in 2006 showed  non obstructive atherosclerosis (40% proximal LAD, 40% mid RCA) and had nuclear stress test in 2018 showed normal perfusion, LVEF was only 48% .  Normal ABI bilaterally February 2020  Past Medical History:  Diagnosis Date   Aortic valve sclerosis    Atherosclerosis of coronary artery without angina pectoris 07/05/2014   Cervical spondylosis without myelopathy 02/13/2013   Chest pain 01/29/2019   Degeneration of cervical intervertebral disc 02/13/2013   Depression    sometimes   Diabetes mellitus type 2, diet-controlled (HCC)    Elevated troponin    GERD (gastroesophageal reflux disease)    Heart murmur    HTN (hypertension) 07/06/2013   Hypercholesterolemia 08/21/2015   Hyperlipidemia 07/06/2013   Medication management 08/21/2015   Pre-diabetes    Rectal bleeding 09/21/2019   Stiffness of left shoulder joint 07/19/2013    Past Surgical History:  Procedure Laterality Date   BIOPSY  12/14/2019   Procedure: BIOPSY;  Surgeon: Daneil Dolin, MD;  [facility];  Service: Endoscopy;;   CERVICAL SPINE SURGERY     COLONOSCOPY  07/26/2012   Procedure: COLONOSCOPY;  Surgeon: Jamesetta So, MD;  [facility];  Service: Gastroenterology;  Laterality: N/A;   COLONOSCOPY WITH PROPOFOL N/A 12/14/2019   Procedure: COLONOSCOPY WITH PROPOFOL;  Surgeon: Daneil Dolin, MD;  [facility];  Service: Endoscopy;   Laterality: N/A;  9:00am   KNEE ARTHROSCOPY Left    torn ligament   LUMBAR LAMINECTOMY/DECOMPRESSION MICRODISCECTOMY Bilateral 12/27/2019   Procedure: BIATERAL LUMBAR TWO- LUMBAR THREE, LUMBAR THREE- LUMBAR FOUR, LUMBAR FOUR-FIVE LAMINOTOMY/FORAMINOTOMY 3 LEVELS;  Surgeon: Newman Pies, MD;  Location: [facility];  Service: Neurosurgery;  Laterality: Bilateral;  POSTERIOR   PVC ABLATION N/A 04/18/2021   Procedure: PVC ABLATION;  Surgeon: Vickie Epley, MD;  Location: [facility];  Service: Cardiovascular;  Laterality: N/A;   SPINE SURGERY N/A    Phreesia 02/24/2020    Outpatient Medications Prior to Visit  Medication Sig Dispense Refill   amiodarone (PACERONE) 200 MG tablet Take 1 tablet (200 mg total) by mouth daily. 90 tablet 2   amLODipine (NORVASC) 10 MG tablet Take 1 tablet (10 mg total) by mouth daily. 90 tablet 1   aspirin EC 81 MG tablet Take 81 mg by mouth daily.     benazepril (LOTENSIN) 10 MG tablet TAKE 1 TABLET EVERY DAY 90 tablet 3   blood glucose meter kit and supplies Dispense based on patient and insurance preference. Use up to four times daily as directed. (FOR ICD-10 E10.9, E11.9). 1 each 0   glipiZIDE (GLUCOTROL XL) 2.5 MG 24 hr tablet TAKE 1 TABLET EVERY DAY WITH BREAKFAST 90 tablet 3   magnesium oxide (MAG-OX) 400 MG tablet 1 tablet as needed     meclizine (ANTIVERT) 25 MG tablet Take 1 tablet (25 mg total) by mouth 3 (three) times daily as needed for dizziness. 30 tablet 1   multivitamin-iron-minerals-folic acid (CENTRUM) chewable tablet Chew 1 tablet by mouth daily.     OVER THE COUNTER MEDICATION Apple cider vinegar and Ginger Gummy - take 1 gummy by mouth once daily     spironolactone-hydrochlorothiazide (ALDACTAZIDE) 25-25 MG tablet TAKE 1 TABLET EVERY OTHER DAY 45 tablet 0   Evolocumab (REPATHA SURECLICK) 854 MG/ML SOAJ INJECT 140 MG INTO THE SKIN EVERY 14 DAY 6 mL 3   gabapentin (NEURONTIN) 300 MG capsule 1 capsule (Patient not taking: Reported on  07/27/2022)     No facility-administered medications prior to visit.     Allergies:   Metformin and related and Statins   Social History   Socioeconomic History   Marital status: Widowed    Spouse name: Cherly    Number of children: 1   Years of education: Not on file   Highest education level: 12th grade  Occupational History    Comment: Retired   Tobacco Use   Smoking status: Never    Passive exposure: Never   Smokeless tobacco: Never  Vaping Use   Vaping Use: Never used  Substance and Sexual Activity   Alcohol use: No   Drug use: No   Sexual activity: Not Currently  Other Topics Concern   Not on file  Social History Narrative   Live alone right now, wife passed in 2009    [place] daughter-grandchild 2 live in [place]      Enjoys playing golf      Diet: eats what he likes, sweet, fast food, does not  eat a lot of canned or boxed foods.    Does not eat enough fruits and veggies.       Caffeine: 1 cup daily   Water: 3-4 bottles of water daily, Gatorade every now and then         Wears seat belt   Wears sun protection when golfing   Smoke detectors at home   Does not use phone while driving   Social Determinants of Health   Financial Resource Strain: Low Risk  (03/03/2021)   Overall Financial Resource Strain (CARDIA)    Difficulty of Paying Living Expenses: Not hard at all  Food Insecurity: No Food Insecurity (03/09/2022)   Hunger Vital Sign    Worried About Running Out of Food in the Last Year: Never true    Ran Out of Food in the Last Year: Never true  Transportation Needs: No Transportation Needs (03/09/2022)   PRAPARE - Hydrologist (Medical): No    Lack of Transportation (Non-Medical): No  Physical Activity: Inactive (03/09/2022)   Exercise Vital Sign    Days of Exercise per Week: 0 days    Minutes of Exercise per Session: 0 min  Stress: No Stress Concern Present (03/03/2021)   [place]    Feeling of Stress : Not at all  Social Connections: Moderately Isolated (03/09/2022)   Social Connection and Isolation Panel [NHANES]    Frequency of Communication with Friends and Family: More than three times a week    Frequency of Social Gatherings with Friends and Family: More than three times a week    Attends Religious Services: More than 4 times per year    Active Member of [employer] or Organizations: No    Attends Archivist Meetings: Never    Marital Status: Widowed     Family History:  The patient's family history includes Asthma in his mother; Hypertension in his father.   ROS:   Please see the history of present illness.    All other systems are reviewed and are negative.  PHYSICAL EXAM:   VS:  BP 133/68 (BP Location: Left Arm, Patient Position: Sitting)   Pulse (!) 59   Ht '5\' 6"'$  (1.676 m)   Wt 210 lb (95.3 kg)   SpO2 96%   BMI 33.89 kg/m      General: Alert, oriented x3, no distress, obese.  Appears younger than stated age. Head: no evidence of trauma, PERRL, EOMI, no exophtalmos or lid lag, no myxedema, no xanthelasma; normal ears, nose and oropharynx Neck: normal jugular venous pulsations and no hepatojugular reflux; brisk carotid pulses without delay and no carotid bruits Chest: clear to auscultation, no signs of consolidation by percussion or palpation, normal fremitus, symmetrical and full respiratory excursions Cardiovascular: normal position and quality of the apical impulse, regular rhythm, normal first and second heart sounds, no murmurs, rubs or gallops Abdomen: no tenderness or distention, no masses by palpation, no abnormal pulsatility or arterial bruits, normal bowel sounds, no hepatosplenomegaly Extremities: no clubbing, cyanosis or edema; 2+ radial, ulnar and brachial pulses bilaterally; 2+ right femoral, posterior tibial and dorsalis pedis pulses; 2+ left femoral, posterior tibial and dorsalis pedis pulses; no  subclavian or femoral bruits Neurological: grossly nonfocal Psych: Normal mood and affect   Wt Readings from Last 3 Encounters:  07/27/22 210 lb (95.3 kg)  07/24/22 210 lb 12.8 oz (95.6 kg)  04/28/22 217 lb (98.4 kg)  Studies/Labs Reviewed:   EKG:  EKG is ordered today.  It shows sinus bradycardia, a single PVC, PRWP. QTc 417 ms  14-day event monitor 02/06/2021 The dominant rhythm was normal sinus with a normal circadian rhythm variation. There are extremely frequent premature ventricular beats (representing 15.7% of all QRS complexes), with frequent ventricular couplets and rare episodes of 4-7 beat runs of nonsustained ventricular tachycardia. Although multiple PVC morphologies are seen, there is a dominant PVC morphology and occasional bigeminy and trigeminy are seen There is a remarkable reduction, almost complete abolition of ventricular ectopy during sleep Atrial ectopy is rare and there is no atrial fibrillation. A 5 beat run of atrial tachycardia seen. Patient diary was not submitted. There is mild expected bradycardia during sleep. There are no episodes of severe bradycardia or sinus pauses.   Abnormal 14-day event monitor due to the presence of extremely frequent PVCs and ventricular couplets, occasionally in the form of very brief nonsustained VT. There is a remarkable reduction in ventricular ectopy burden during sleep.  3-day event monitor 09/11/2021: HR 40 - 99 bpm, average 53 bpm. Rare supraventricular ectopy. Frequent ventricular ectopy, 10.1%.    Echocardiogram 05/17/2020: 1. Left ventricular ejection fraction, by estimation, is 55%. The left ventricle has normal function. The left ventricle has no regional wall motion abnormalities. There is mild left ventricular hypertrophy. Left ventricular diastolic parameters are consistent with Grade I diastolic dysfunction (impaired relaxation). Elevated left atrial pressure. 2. Right ventricular systolic function is  normal. The right ventricular size is normal. 3. The mitral valve is normal in structure. No evidence of mitral valve regurgitation. No evidence of mitral stenosis. 4. The aortic valve is tricuspid. There is mild calcification of the aortic valve. There is mild thickening of the aortic valve. Aortic valve regurgitation is mild. No aortic stenosis is present. 5. The inferior vena cava is normal in size with greater than 50% respiratory variability,suggesting right atrial pressure of 3 mmHg.  Echocardiogram 04/08/2021:   1. Frequent PVC's. Left ventricular ejection fraction, by estimation, is  40 to 45%. The left ventricle has mildly decreased function. The left  ventricle has no regional wall motion abnormalities. The left ventricular  internal cavity size was mildly  dilated. Left ventricular diastolic parameters are consistent with Grade I  diastolic dysfunction (impaired relaxation).   2. Right ventricular systolic function is normal. The right ventricular  size is normal. There is normal pulmonary artery systolic pressure. The  estimated right ventricular systolic pressure is 99.3 mmHg.   3. Left atrial size was mildly dilated.   4. The mitral valve is normal in structure. No evidence of mitral valve  regurgitation. No evidence of mitral stenosis.   5. The aortic valve is tricuspid. There is moderate calcification of the  aortic valve. There is moderate thickening of the aortic valve. Aortic  valve regurgitation is mild. Mild to moderate aortic valve  sclerosis/calcification is present, without any  evidence of aortic stenosis.   6. Pulmonic valve regurgitation is moderate.   7. The inferior vena cava is normal in size with greater than 50%  respiratory variability, suggesting right atrial pressure of 3 mmHg.   Comparison(s): Prior images reviewed side by side. The left ventricular  function is worsened.   Nuclear stress test 01/30/2019:  There was no ST segment deviation noted  during stress. This is a low risk study. Nuclear stress EF: 47%. Defect 1: There is a medium defect of mild severity present in the basal inferior and mid inferior  location. This is likely due to soft tissue attenuation. No large ischemic territories.  Recent Labs: 08/27/2021: Magnesium 2.1 07/24/2022: ALT 56; BUN 14; Creatinine, Ser 1.20; Hemoglobin 14.7; Platelets 309; Potassium 4.1; Sodium 141; TSH 1.770  BMET    Component Value Date/Time   NA 141 07/24/2022 1552   K 4.1 07/24/2022 1552   CL 104 07/24/2022 1552   CO2 22 07/24/2022 1552   GLUCOSE 77 07/24/2022 1552   GLUCOSE 108 (H) 03/11/2022 0120   BUN 14 07/24/2022 1552   CREATININE 1.20 07/24/2022 1552   CREATININE 1.13 06/28/2019 0830   CALCIUM 8.8 07/24/2022 1552   GFRNONAA 56 (L) 03/11/2022 0120   GFRNONAA 62 06/28/2019 0830   GFRAA 53 (L) 07/24/2020 0913   GFRAA 72 06/28/2019 0830    Lipid Panel    Component Value Date/Time   CHOL 152 07/24/2022 1552   TRIG 224 (H) 07/24/2022 1552   HDL 43 07/24/2022 1552   CHOLHDL 3.5 07/24/2022 1552   CHOLHDL 3.3 01/29/2019 0800   VLDL 30 01/29/2019 0800   LDLCALC 72 07/24/2022 1552     ASSESSMENT:    1. PVCs (premature ventricular contractions)   2. Encounter for monitoring amiodarone therapy   3. Chronic combined systolic and diastolic heart failure ([place])   4. Essential hypertension   5. Heterozygous familial hypercholesterolemia   6. Coronary artery disease involving native coronary artery of native heart without angina pectoris   7. Diabetes mellitus type 2 in obese ([place])   8. Preoperative cardiovascular examination      PLAN:  In order of problems listed above:  PVCs: Frequency seems to be improved since he has had fewer episodes of bigeminy, but will need to confirm with event monitor.  His episodes of flushing may represent brief bursts of arrhythmia.  Will have him wear another event monitor and asked him to promptly mark any events of "heat".  The previous  event monitor was performed after only about 2-3 months of amiodarone.  Hopefully will see additional improvement. Amiodarone: Recent labs show a tiny increase in one of the transaminases, but all the other liver function tests are normal.  Normal TSH.  No severe bradycardia.  Need to check LFTs and TSH at least every 6 months, yearly eye exam, stay alert for potential drug interactions, he should promptly report any unexplained respiratory symptoms. CHF: Currently seems to have NYHA functional class I, limited by knee pain more than any respiratory symptoms.  Remote coronary angiography did not show any severe stenoses and recent nuclear perfusion study showed normal perfusion, EF 47%.  Euvolemic without diuretics.  Beta-blocker stopped due to bradycardia on amiodarone.  On ACE inhibitor, spironolactone.  Reevaluate EF after 1 year of amiodarone therapy.  If EF is still abnormal, consider transitioning from ACE inhibitor to [place]. HTN: Well-controlled.   HLP: Statin intolerant due to myopathy symptoms.  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 CAD: Diffuse mild nonobstructive disease on previous cardiac catheterization.  He does not have angina pectoris and had a normal recent nuclear perfusion study. DM/obesity: Diabetes is well-controlled and recent hemoglobin A1c of 6.2%.  Encourage weight loss. Preop CV eval: Well compensated heart failure.  Low risk with planned orthopedic procedure.  Note sent to Dr. Berenice Primas via epic.    Medication Adjustments/Labs and Tests Ordered: Current medicines are reviewed at length with the patient today.  Concerns regarding medicines are outlined above.  Medication changes, Labs and Tests ordered today are listed  in the Patient Instructions below. Patient Instructions  Medication Instructions:  Your physician recommends that you continue on your current medications as directed. Please refer to the Current  Medication list given to you today.  *If you need a refill on your cardiac medications before your next appointment, please call your pharmacy*   Testing/Procedures: Your physician has requested that you have an echocardiogram. Echocardiography is a painless test that uses sound waves to create images of your heart. It provides your doctor with information about the size and shape of your heart and how well your heart's chambers and valves are working. This procedure takes approximately one hour. There are no restrictions for this procedure. Please do NOT wear cologne, perfume, aftershave, or lotions (deodorant is allowed). Please arrive 15 minutes prior to your appointment time. This procedure will be done at [address] Monitor Instructions  Your physician has requested you wear a ZIO patch monitor for 3 days.  This is a single patch monitor. [employer] supplies one patch monitor per enrollment. Additional stickers are not available. Please do not apply patch if you will be having a Nuclear Stress Test,  Echocardiogram, Cardiac CT, MRI, or Chest Xray during the period you would be wearing the  monitor. The patch cannot be worn during these tests. You cannot remove and re-apply the  ZIO XT patch monitor.  Your ZIO patch monitor will be mailed 3 day USPS to your address on file. It may take 3-5 days  to receive your monitor after you have been enrolled.  Once you have received your monitor, please review the enclosed instructions. Your monitor  has already been registered assigning a specific monitor serial # to you.  Billing and Patient Assistance Program Information  We have supplied [employer] with any of your insurance information on file for billing purposes. [employer] offers a sliding scale Patient Assistance Program for patients that do not have  insurance, or whose insurance does not completely cover the cost of the ZIO monitor.  You must apply for the  Patient Assistance Program to qualify for this discounted rate.  To apply, please call [employer] at [phone], select option 4, select option 2, ask to apply for  Patient Assistance Program. Theodore Demark will ask your household income, and how many people  are in your household. They will quote your out-of-pocket cost based on that information.  [employer] will also be able to set up a 42-month interest-free payment plan if needed.  Applying the monitor   Shave hair from upper left chest.  Hold abrader disc by orange tab. Rub abrader in 40 strokes over the upper left chest as  indicated in your monitor instructions.  Clean area with 4 enclosed alcohol pads. Let dry.  Apply patch as indicated in monitor instructions. Patch will be placed under collarbone on left  side of chest with arrow pointing upward.  Rub patch adhesive wings for 2 minutes. Remove white label marked "1". Remove the white  label marked "2". Rub patch adhesive wings for 2 additional minutes.  While looking in a mirror, press and release button in center of patch. A small green light will  flash 3-4 times. This will be your only indicator that the monitor has been turned on.  Do not shower for the first 24 hours. You may shower after the first 24 hours.  Press the button if you feel a symptom. You will hear a small click. Record Date, Time  and  Symptom in the Patient Logbook.  When you are ready to remove the patch, follow instructions on the last 2 pages of Patient  Logbook. Stick patch monitor onto the last page of Patient Logbook.  Place Patient Logbook in the blue and white box. Use locking tab on box and tape box closed  securely. The blue and white box has prepaid postage on it. Please place it in the mailbox as  soon as possible. Your physician should have your test results approximately 7 days after the  monitor has been mailed back to [facility]  Call [place] at [phone] if you have  questions regarding  your ZIO XT patch monitor. Call them immediately if you see an orange light blinking on your  monitor.  If your monitor falls off in less than 4 days, contact our Monitor department at [phone].  If your monitor becomes loose or falls off after 4 days call [employer] at [phone] for  suggestions on securing your monitor   Follow-Up: At [facility], you and your health needs are our priority.  As part of our continuing mission to provide you with exceptional heart care, we have created designated Provider Care Teams.  These Care Teams include your primary Cardiologist (physician) and Advanced Practice Providers (APPs -  Physician Assistants and Nurse Practitioners) who all work together to provide you with the care you need, when you need it.  We recommend signing up for the patient portal called "MyChart".  Sign up information is provided on this After Visit Summary.  MyChart is used to connect with patients for Virtual Visits (Telemedicine).  Patients are able to view lab/test results, encounter notes, upcoming appointments, etc.  Non-urgent messages can be sent to your provider as well.   To learn more about what you can do with MyChart, go to [email or web address].    Your next appointment:   6 month(s)  The format for your next appointment:   In Person  Provider:   Sanda Klein, MD        Signed, Sanda Klein, MD  07/27/2022 10:34 AM    [address], [place], [place]  [address] Phone: [phone]; Fax: [phone]

## 2022-07-27 NOTE — Patient Instructions (Signed)
Medication Instructions:  Your physician recommends that you continue on your current medications as directed. Please refer to the Current Medication list given to you today.  *If you need a refill on your cardiac medications before your next appointment, please call your pharmacy*   Testing/Procedures: Your physician has requested that you have an echocardiogram. Echocardiography is a painless test that uses sound waves to create images of your heart. It provides your doctor with information about the size and shape of your heart and how well your heart's chambers and valves are working. This procedure takes approximately one hour. There are no restrictions for this procedure. Please do NOT wear cologne, perfume, aftershave, or lotions (deodorant is allowed). Please arrive 15 minutes prior to your appointment time. This procedure will be done at [address] Monitor Instructions  Your physician has requested you wear a ZIO patch monitor for 3 days.  This is a single patch monitor. [employer] supplies one patch monitor per enrollment. Additional stickers are not available. Please do not apply patch if you will be having a Nuclear Stress Test,  Echocardiogram, Cardiac CT, MRI, or Chest Xray during the period you would be wearing the  monitor. The patch cannot be worn during these tests. You cannot remove and re-apply the  ZIO XT patch monitor.  Your ZIO patch monitor will be mailed 3 day USPS to your address on file. It may take 3-5 days  to receive your monitor after you have been enrolled.  Once you have received your monitor, please review the enclosed instructions. Your monitor  has already been registered assigning a specific monitor serial # to you.  Billing and Patient Assistance Program Information  We have supplied [employer] with any of your insurance information on file for billing purposes. [employer] offers a sliding scale Patient Assistance Program  for patients that do not have  insurance, or whose insurance does not completely cover the cost of the ZIO monitor.  You must apply for the Patient Assistance Program to qualify for this discounted rate.  To apply, please call [employer] at [phone], select option 4, select option 2, ask to apply for  Patient Assistance Program. Theodore Demark will ask your household income, and how many people  are in your household. They will quote your out-of-pocket cost based on that information.  [employer] will also be able to set up a 4-month interest-free payment plan if needed.  Applying the monitor   Shave hair from upper left chest.  Hold abrader disc by orange tab. Rub abrader in 40 strokes over the upper left chest as  indicated in your monitor instructions.  Clean area with 4 enclosed alcohol pads. Let dry.  Apply patch as indicated in monitor instructions. Patch will be placed under collarbone on left  side of chest with arrow pointing upward.  Rub patch adhesive wings for 2 minutes. Remove white label marked "1". Remove the white  label marked "2". Rub patch adhesive wings for 2 additional minutes.  While looking in a mirror, press and release button in center of patch. A small green light will  flash 3-4 times. This will be your only indicator that the monitor has been turned on.  Do not shower for the first 24 hours. You may shower after the first 24 hours.  Press the button if you feel a symptom. You will hear a small click. Record Date, Time and  Symptom in the Patient Logbook.  When  you are ready to remove the patch, follow instructions on the last 2 pages of Patient  Logbook. Stick patch monitor onto the last page of Patient Logbook.  Place Patient Logbook in the blue and white box. Use locking tab on box and tape box closed  securely. The blue and white box has prepaid postage on it. Please place it in the mailbox as  soon as possible. Your physician should have your test results  approximately 7 days after the  monitor has been mailed back to [facility].  Call [place] at [phone] if you have questions regarding  your ZIO XT patch monitor. Call them immediately if you see an orange light blinking on your  monitor.  If your monitor falls off in less than 4 days, contact our Monitor department at [phone].  If your monitor becomes loose or falls off after 4 days call [employer] at [phone] for  suggestions on securing your monitor   Follow-Up: At [facility], you and your health needs are our priority.  As part of our continuing mission to provide you with exceptional heart care, we have created designated Provider Care Teams.  These Care Teams include your primary Cardiologist (physician) and Advanced Practice Providers (APPs -  Physician Assistants and Nurse Practitioners) who all work together to provide you with the care you need, when you need it.  We recommend signing up for the patient portal called "MyChart".  Sign up information is provided on this After Visit Summary.  MyChart is used to connect with patients for Virtual Visits (Telemedicine).  Patients are able to view lab/test results, encounter notes, upcoming appointments, etc.  Non-urgent messages can be sent to your provider as well.   To learn more about what you can do with MyChart, go to [email or web address].    Your next appointment:   6 month(s)  The format for your next appointment:   In Person  Provider:   Sanda Klein, MD

## 2022-07-29 ENCOUNTER — Ambulatory Visit: Payer: PRIVATE HEALTH INSURANCE | Admitting: Internal Medicine

## 2022-07-29 DIAGNOSIS — M1711 Unilateral primary osteoarthritis, right knee: Secondary | ICD-10-CM | POA: Insufficient documentation

## 2022-07-29 NOTE — Assessment & Plan Note (Signed)
He will soon undergo a right knee TKA with Dr. Berenice Primas.  Presenting today for perioperative evaluation. -Repeat labs and ECG have been ordered today -Will complete and return perioperative risk assessment form to [place] as soon as labs are updated -Based on my evaluation today, Douglas Keller is at a low surgical risk and should be able to proceed with surgery.

## 2022-07-29 NOTE — Assessment & Plan Note (Signed)
His last A1c was 6.2.  He is currently prescribed glipizide 2.5 mg daily. -Repeat A1c today

## 2022-07-29 NOTE — Assessment & Plan Note (Signed)
Currently prescribed amlodipine 10 mg daily and Aldactazide 25-25 mg every other day for treatment of hypertension.  His blood pressure today is 138/82. -No medication changes today

## 2022-07-29 NOTE — Addendum Note (Signed)
Addended by: Marland Kitchen E on: 07/29/2022 11:23 AM   Modules accepted: Level of Service

## 2022-07-29 NOTE — Assessment & Plan Note (Signed)
Currently prescribed biweekly Repatha injections.  Repeat lipid panel ordered today

## 2022-07-30 DIAGNOSIS — Z79899 Other long term (current) drug therapy: Secondary | ICD-10-CM

## 2022-07-30 DIAGNOSIS — I493 Ventricular premature depolarization: Secondary | ICD-10-CM

## 2022-07-30 DIAGNOSIS — Z5181 Encounter for therapeutic drug level monitoring: Secondary | ICD-10-CM | POA: Diagnosis not present

## 2022-08-04 ENCOUNTER — Ambulatory Visit (HOSPITAL_COMMUNITY)
Admission: RE | Admit: 2022-08-04 | Discharge: 2022-08-04 | Disposition: A | Discharge status: Home / Self Care | Payer: Medicare HMO | Source: Ambulatory Visit | Attending: Cardiovascular Disease | Admitting: Cardiovascular Disease

## 2022-08-04 DIAGNOSIS — Z5181 Encounter for therapeutic drug level monitoring: Secondary | ICD-10-CM | POA: Diagnosis not present

## 2022-08-04 DIAGNOSIS — I2510 Atherosclerotic heart disease of native coronary artery without angina pectoris: Secondary | ICD-10-CM | POA: Insufficient documentation

## 2022-08-04 DIAGNOSIS — I5042 Chronic combined systolic (congestive) and diastolic (congestive) heart failure: Secondary | ICD-10-CM | POA: Insufficient documentation

## 2022-08-04 DIAGNOSIS — E669 Obesity, unspecified: Secondary | ICD-10-CM | POA: Insufficient documentation

## 2022-08-04 DIAGNOSIS — I10 Essential (primary) hypertension: Secondary | ICD-10-CM | POA: Diagnosis not present

## 2022-08-04 DIAGNOSIS — E1169 Type 2 diabetes mellitus with other specified complication: Secondary | ICD-10-CM | POA: Insufficient documentation

## 2022-08-04 DIAGNOSIS — I493 Ventricular premature depolarization: Secondary | ICD-10-CM | POA: Diagnosis not present

## 2022-08-04 DIAGNOSIS — Z79899 Other long term (current) drug therapy: Secondary | ICD-10-CM | POA: Insufficient documentation

## 2022-08-04 DIAGNOSIS — E7801 Familial hypercholesterolemia: Secondary | ICD-10-CM | POA: Diagnosis not present

## 2022-08-04 LAB — ECHOCARDIOGRAM COMPLETE
AR max vel: 1.57 cm2
AV Area VTI: 1.6 cm2
AV Area mean vel: 1.52 cm2
AV Mean grad: 7 mmHg
AV Peak grad: 12.7 mmHg
Ao pk vel: 1.78 m/s
Area-P 1/2: 1.87 cm2
P 1/2 time: 691 msec
S' Lateral: 3 cm

## 2022-08-04 NOTE — Progress Notes (Signed)
*  PRELIMINARY RESULTS* Echocardiogram 2D Echocardiogram has been performed.  Douglas Keller 08/04/2022, 12:02 PM

## 2022-08-05 ENCOUNTER — Other Ambulatory Visit (HOSPITAL_COMMUNITY): Payer: Self-pay

## 2022-08-10 ENCOUNTER — Other Ambulatory Visit: Payer: Self-pay | Admitting: Pharmacist

## 2022-08-10 DIAGNOSIS — Z5181 Encounter for therapeutic drug level monitoring: Secondary | ICD-10-CM | POA: Diagnosis not present

## 2022-08-10 DIAGNOSIS — I493 Ventricular premature depolarization: Secondary | ICD-10-CM | POA: Diagnosis not present

## 2022-08-10 MED ORDER — REPATHA SURECLICK 140 MG/ML ~~LOC~~ SOAJ: SUBCUTANEOUS | 3 refills | Status: DC

## 2022-08-20 DIAGNOSIS — M170 Bilateral primary osteoarthritis of knee: Secondary | ICD-10-CM | POA: Diagnosis not present

## 2022-08-20 DIAGNOSIS — M1711 Unilateral primary osteoarthritis, right knee: Secondary | ICD-10-CM | POA: Diagnosis not present

## 2022-08-24 DIAGNOSIS — R262 Difficulty in walking, not elsewhere classified: Secondary | ICD-10-CM | POA: Diagnosis not present

## 2022-08-24 DIAGNOSIS — M25661 Stiffness of right knee, not elsewhere classified: Secondary | ICD-10-CM | POA: Diagnosis not present

## 2022-08-24 DIAGNOSIS — M1731 Unilateral post-traumatic osteoarthritis, right knee: Secondary | ICD-10-CM | POA: Diagnosis not present

## 2022-08-27 DIAGNOSIS — H25813 Combined forms of age-related cataract, bilateral: Secondary | ICD-10-CM | POA: Diagnosis not present

## 2022-09-02 DIAGNOSIS — G8918 Other acute postprocedural pain: Secondary | ICD-10-CM | POA: Diagnosis not present

## 2022-09-02 DIAGNOSIS — Z96651 Presence of right artificial knee joint: Secondary | ICD-10-CM | POA: Diagnosis not present

## 2022-09-02 DIAGNOSIS — M1711 Unilateral primary osteoarthritis, right knee: Secondary | ICD-10-CM | POA: Diagnosis not present

## 2022-09-02 DIAGNOSIS — R6889 Other general symptoms and signs: Secondary | ICD-10-CM | POA: Diagnosis not present

## 2022-09-02 HISTORY — PX: REPLACEMENT TOTAL KNEE: SUR1224

## 2022-09-03 DIAGNOSIS — Z9181 History of falling: Secondary | ICD-10-CM | POA: Diagnosis not present

## 2022-09-03 DIAGNOSIS — Z96651 Presence of right artificial knee joint: Secondary | ICD-10-CM | POA: Diagnosis not present

## 2022-09-03 DIAGNOSIS — Z7982 Long term (current) use of aspirin: Secondary | ICD-10-CM | POA: Diagnosis not present

## 2022-09-03 DIAGNOSIS — Z471 Aftercare following joint replacement surgery: Secondary | ICD-10-CM | POA: Diagnosis not present

## 2022-09-03 DIAGNOSIS — I119 Hypertensive heart disease without heart failure: Secondary | ICD-10-CM | POA: Diagnosis not present

## 2022-09-05 DIAGNOSIS — I119 Hypertensive heart disease without heart failure: Secondary | ICD-10-CM | POA: Diagnosis not present

## 2022-09-05 DIAGNOSIS — Z9181 History of falling: Secondary | ICD-10-CM | POA: Diagnosis not present

## 2022-09-05 DIAGNOSIS — Z7982 Long term (current) use of aspirin: Secondary | ICD-10-CM | POA: Diagnosis not present

## 2022-09-05 DIAGNOSIS — Z471 Aftercare following joint replacement surgery: Secondary | ICD-10-CM | POA: Diagnosis not present

## 2022-09-05 DIAGNOSIS — Z96651 Presence of right artificial knee joint: Secondary | ICD-10-CM | POA: Diagnosis not present

## 2022-09-07 DIAGNOSIS — Z7982 Long term (current) use of aspirin: Secondary | ICD-10-CM | POA: Diagnosis not present

## 2022-09-07 DIAGNOSIS — Z96651 Presence of right artificial knee joint: Secondary | ICD-10-CM | POA: Diagnosis not present

## 2022-09-07 DIAGNOSIS — Z471 Aftercare following joint replacement surgery: Secondary | ICD-10-CM | POA: Diagnosis not present

## 2022-09-07 DIAGNOSIS — Z9181 History of falling: Secondary | ICD-10-CM | POA: Diagnosis not present

## 2022-09-07 DIAGNOSIS — I119 Hypertensive heart disease without heart failure: Secondary | ICD-10-CM | POA: Diagnosis not present

## 2022-09-08 DIAGNOSIS — Z7982 Long term (current) use of aspirin: Secondary | ICD-10-CM | POA: Diagnosis not present

## 2022-09-08 DIAGNOSIS — Z9181 History of falling: Secondary | ICD-10-CM | POA: Diagnosis not present

## 2022-09-08 DIAGNOSIS — I119 Hypertensive heart disease without heart failure: Secondary | ICD-10-CM | POA: Diagnosis not present

## 2022-09-08 DIAGNOSIS — Z471 Aftercare following joint replacement surgery: Secondary | ICD-10-CM | POA: Diagnosis not present

## 2022-09-08 DIAGNOSIS — Z96651 Presence of right artificial knee joint: Secondary | ICD-10-CM | POA: Diagnosis not present

## 2022-09-10 ENCOUNTER — Other Ambulatory Visit: Payer: Self-pay

## 2022-09-10 DIAGNOSIS — Z7982 Long term (current) use of aspirin: Secondary | ICD-10-CM | POA: Diagnosis not present

## 2022-09-10 DIAGNOSIS — Z96651 Presence of right artificial knee joint: Secondary | ICD-10-CM | POA: Diagnosis not present

## 2022-09-10 DIAGNOSIS — Z9181 History of falling: Secondary | ICD-10-CM | POA: Diagnosis not present

## 2022-09-10 DIAGNOSIS — Z471 Aftercare following joint replacement surgery: Secondary | ICD-10-CM | POA: Diagnosis not present

## 2022-09-10 DIAGNOSIS — I119 Hypertensive heart disease without heart failure: Secondary | ICD-10-CM | POA: Diagnosis not present

## 2022-09-10 MED ORDER — AMLODIPINE BESYLATE 10 MG PO TABS: 10.0000 mg | ORAL_TABLET | Freq: Every day | ORAL | 2 refills | Status: DC

## 2022-09-11 DIAGNOSIS — Z9181 History of falling: Secondary | ICD-10-CM | POA: Diagnosis not present

## 2022-09-11 DIAGNOSIS — Z96651 Presence of right artificial knee joint: Secondary | ICD-10-CM | POA: Diagnosis not present

## 2022-09-11 DIAGNOSIS — I119 Hypertensive heart disease without heart failure: Secondary | ICD-10-CM | POA: Diagnosis not present

## 2022-09-11 DIAGNOSIS — Z471 Aftercare following joint replacement surgery: Secondary | ICD-10-CM | POA: Diagnosis not present

## 2022-09-11 DIAGNOSIS — Z7982 Long term (current) use of aspirin: Secondary | ICD-10-CM | POA: Diagnosis not present

## 2022-09-14 DIAGNOSIS — M25561 Pain in right knee: Secondary | ICD-10-CM | POA: Diagnosis not present

## 2022-09-14 DIAGNOSIS — M62561 Muscle wasting and atrophy, not elsewhere classified, right lower leg: Secondary | ICD-10-CM | POA: Diagnosis not present

## 2022-09-14 DIAGNOSIS — R269 Unspecified abnormalities of gait and mobility: Secondary | ICD-10-CM | POA: Diagnosis not present

## 2022-09-14 DIAGNOSIS — R6889 Other general symptoms and signs: Secondary | ICD-10-CM | POA: Diagnosis not present

## 2022-09-14 DIAGNOSIS — M25661 Stiffness of right knee, not elsewhere classified: Secondary | ICD-10-CM | POA: Diagnosis not present

## 2022-09-15 DIAGNOSIS — M1711 Unilateral primary osteoarthritis, right knee: Secondary | ICD-10-CM | POA: Diagnosis not present

## 2022-09-15 DIAGNOSIS — R6889 Other general symptoms and signs: Secondary | ICD-10-CM | POA: Diagnosis not present

## 2022-09-17 ENCOUNTER — Encounter: Payer: Self-pay | Admitting: Radiology

## 2022-09-24 DIAGNOSIS — R269 Unspecified abnormalities of gait and mobility: Secondary | ICD-10-CM | POA: Diagnosis not present

## 2022-09-24 DIAGNOSIS — M25661 Stiffness of right knee, not elsewhere classified: Secondary | ICD-10-CM | POA: Diagnosis not present

## 2022-09-24 DIAGNOSIS — M25561 Pain in right knee: Secondary | ICD-10-CM | POA: Diagnosis not present

## 2022-09-24 DIAGNOSIS — M62561 Muscle wasting and atrophy, not elsewhere classified, right lower leg: Secondary | ICD-10-CM | POA: Diagnosis not present

## 2022-09-24 DIAGNOSIS — R6889 Other general symptoms and signs: Secondary | ICD-10-CM | POA: Diagnosis not present

## 2022-09-29 DIAGNOSIS — M62561 Muscle wasting and atrophy, not elsewhere classified, right lower leg: Secondary | ICD-10-CM | POA: Diagnosis not present

## 2022-09-29 DIAGNOSIS — R269 Unspecified abnormalities of gait and mobility: Secondary | ICD-10-CM | POA: Diagnosis not present

## 2022-09-29 DIAGNOSIS — M25561 Pain in right knee: Secondary | ICD-10-CM | POA: Diagnosis not present

## 2022-09-29 DIAGNOSIS — R6889 Other general symptoms and signs: Secondary | ICD-10-CM | POA: Diagnosis not present

## 2022-09-29 DIAGNOSIS — M25661 Stiffness of right knee, not elsewhere classified: Secondary | ICD-10-CM | POA: Diagnosis not present

## 2022-10-02 ENCOUNTER — Encounter: Payer: Self-pay | Admitting: Podiatry

## 2022-10-02 DIAGNOSIS — R6889 Other general symptoms and signs: Secondary | ICD-10-CM | POA: Diagnosis not present

## 2022-10-02 DIAGNOSIS — R269 Unspecified abnormalities of gait and mobility: Secondary | ICD-10-CM | POA: Diagnosis not present

## 2022-10-02 DIAGNOSIS — M62561 Muscle wasting and atrophy, not elsewhere classified, right lower leg: Secondary | ICD-10-CM | POA: Diagnosis not present

## 2022-10-02 DIAGNOSIS — M25561 Pain in right knee: Secondary | ICD-10-CM | POA: Diagnosis not present

## 2022-10-02 DIAGNOSIS — M25661 Stiffness of right knee, not elsewhere classified: Secondary | ICD-10-CM | POA: Diagnosis not present

## 2022-10-06 DIAGNOSIS — M25661 Stiffness of right knee, not elsewhere classified: Secondary | ICD-10-CM | POA: Diagnosis not present

## 2022-10-06 DIAGNOSIS — R269 Unspecified abnormalities of gait and mobility: Secondary | ICD-10-CM | POA: Diagnosis not present

## 2022-10-06 DIAGNOSIS — M62561 Muscle wasting and atrophy, not elsewhere classified, right lower leg: Secondary | ICD-10-CM | POA: Diagnosis not present

## 2022-10-06 DIAGNOSIS — M25561 Pain in right knee: Secondary | ICD-10-CM | POA: Diagnosis not present

## 2022-10-09 ENCOUNTER — Ambulatory Visit: Payer: Medicare HMO | Admitting: Podiatry

## 2022-10-09 DIAGNOSIS — M62561 Muscle wasting and atrophy, not elsewhere classified, right lower leg: Secondary | ICD-10-CM | POA: Diagnosis not present

## 2022-10-09 DIAGNOSIS — M25661 Stiffness of right knee, not elsewhere classified: Secondary | ICD-10-CM | POA: Diagnosis not present

## 2022-10-09 DIAGNOSIS — M25561 Pain in right knee: Secondary | ICD-10-CM | POA: Diagnosis not present

## 2022-10-09 DIAGNOSIS — R269 Unspecified abnormalities of gait and mobility: Secondary | ICD-10-CM | POA: Diagnosis not present

## 2022-10-12 ENCOUNTER — Encounter: Payer: Self-pay | Admitting: Podiatrist

## 2022-10-12 ENCOUNTER — Ambulatory Visit: Payer: Medicare HMO | Admitting: Podiatrist

## 2022-10-12 DIAGNOSIS — M79674 Pain in right toe(s): Secondary | ICD-10-CM

## 2022-10-12 DIAGNOSIS — B351 Tinea unguium: Secondary | ICD-10-CM

## 2022-10-12 DIAGNOSIS — M25561 Pain in right knee: Secondary | ICD-10-CM | POA: Diagnosis not present

## 2022-10-12 DIAGNOSIS — M62561 Muscle wasting and atrophy, not elsewhere classified, right lower leg: Secondary | ICD-10-CM | POA: Diagnosis not present

## 2022-10-12 DIAGNOSIS — R269 Unspecified abnormalities of gait and mobility: Secondary | ICD-10-CM | POA: Diagnosis not present

## 2022-10-12 DIAGNOSIS — M79675 Pain in left toe(s): Secondary | ICD-10-CM

## 2022-10-12 DIAGNOSIS — M25661 Stiffness of right knee, not elsewhere classified: Secondary | ICD-10-CM | POA: Diagnosis not present

## 2022-10-12 NOTE — Progress Notes (Signed)
  Subjective:  Patient ID: Douglas Severance., male    DOB: 1941-07-14,  MRN: [id number]  Douglas Severance. presents to clinic today for preventative diabetic foot care and painful thick toenails that are difficult to trim. Pain interferes with ambulation. Aggravating factors include wearing enclosed shoe gear. Pain is relieved with periodic professional debridement.  Chief Complaint  Patient presents with   Nail Problem    DFC BS-did not check today A1C-do not know PCP-[facility] PCP VST-07/2022   New problem(s): None.   PCP is Johnette Abraham, MD.  Allergies  Allergen Reactions   Metformin And Related Diarrhea and Nausea And Vomiting   Statins Other (See Comments)    Muscle/joint pain    Review of Systems: Negative except as noted in the HPI.  Objective: No changes noted in today's physical examination. There were no vitals filed for this visit.  Douglas Luttrull. is a pleasant 82 y.o. male in NAD. AAO x 3.  Vascular Capillary fill time to digits immediate b/l.  DP/PT pulse(s) are palpable b/l lower extremities. Pedal hair sparse. Lower extremity skin temperature gradient within normal limits. No pain with calf compression b/l. No edema noted b/l lower extremities. No cyanosis or clubbing noted.   Neurologic Protective sensation intact 5/5 intact bilaterally with 10g monofilament b/l. Vibratory sensation intact b/l. No clonus b/l.   Dermatologic Pedal skin is warm and supple b/l.  No open wounds b/l lower extremities. No interdigital macerations b/l lower extremities. Toenails 1-5 b/l elongated, discolored, dystrophic, thickened, crumbly with subungual debris and tenderness to dorsal palpation. Nevus noted 4th webspace left foot, unchanged.  Healing excoriations present anterior ankles- relates they are from the compression hose post knee replacement that he had recently.   Orthopedic: Normal muscle strength 5/5 to all lower extremity muscle groups bilaterally. Patient ambulates  independent of any assistive aids. HAV with bunion deformity noted b/l LE.   Assessment/Plan:   ICD-10-CM   1. Pain due to onychomycosis of toenails of both feet  B35.1    M79.675    M79.674        -Consent given for treatment as described below: -Continue supportive shoe gear daily. -Toenails 1-5 b/l were debrided in length and girth with sterile nail nippers and dremel without iatrogenic bleeding.  -Patient/POA to call should there be question/concern in the interim.     Bronson Ing, DPM

## 2022-10-13 DIAGNOSIS — R6889 Other general symptoms and signs: Secondary | ICD-10-CM | POA: Diagnosis not present

## 2022-10-15 DIAGNOSIS — M62561 Muscle wasting and atrophy, not elsewhere classified, right lower leg: Secondary | ICD-10-CM | POA: Diagnosis not present

## 2022-10-15 DIAGNOSIS — R269 Unspecified abnormalities of gait and mobility: Secondary | ICD-10-CM | POA: Diagnosis not present

## 2022-10-15 DIAGNOSIS — M25661 Stiffness of right knee, not elsewhere classified: Secondary | ICD-10-CM | POA: Diagnosis not present

## 2022-10-15 DIAGNOSIS — M25561 Pain in right knee: Secondary | ICD-10-CM | POA: Diagnosis not present

## 2022-10-16 ENCOUNTER — Ambulatory Visit (INDEPENDENT_AMBULATORY_CARE_PROVIDER_SITE_OTHER): Payer: Medicare HMO | Admitting: Family Medicine

## 2022-10-16 ENCOUNTER — Encounter: Payer: Self-pay | Admitting: Family Medicine

## 2022-10-16 VITALS — BP 132/79 | HR 71 | Ht 66.0 in | Wt 208.0 lb

## 2022-10-16 DIAGNOSIS — R059 Cough, unspecified: Secondary | ICD-10-CM | POA: Diagnosis not present

## 2022-10-16 MED ORDER — GUAIFENESIN 100 MG/5ML PO LIQD: 5.0000 mL | ORAL | 0 refills | Status: DC | PRN

## 2022-10-16 NOTE — Patient Instructions (Addendum)
I appreciate the opportunity to provide care to you today!    Follow up:  Dr. Clydene Pugh   Costochondritis usually goes away without any treatment. But there are things that can help you feel better sooner. Some people feel better if they:  ?Do stretching exercises   ?Put a heating pad on the painful area a few times a day.  ?Rest - Avoid doing activities that strain the area for 1 to 2 weeks, including lifting or pushing heavy objects.   Stretches for costochondritis  Doorway stretch:Stand facing an open doorway. Raise your arms to the sides and bend your elbows to a 90 degree angle. Rest your forearm against the wall and with your elbow at shoulder height, and lean forward through the open doorway to stretch your chest muscles. Hold the stretch for 30 to 60 seconds before relaxing. Repeat 10 times.  Sphinx pose: Lie on your stomach while supporting yourself on your elbows. Then open your chest, stretching up and backwards, arching the back. Hold this pose for 10 seconds. Relax to a prone (face-down) position for 20 seconds. Repeat 10 times.  Lateral flexion: Sit with your right arm raised above your head; use your left arm for support. Gently lean to the left and hold for 20 seconds. Repeat the same stretch towards the opposite side. Repeat cycle 10 times.  Variations: Perform the lateral flexion exercise while standing. In addition, after straightening, you can flex forward, bending at the waist. Variation: Raise your hands higher on the doorframe while stretching.  I recommend that you continue taking Tylenol as needed for pain relief    Please continue to a heart-healthy diet and increase your physical activities. Try to exercise for 52mins at least five days a week.      It was a pleasure to see you and I look forward to continuing to work together on your health and well-being. Please do not hesitate to call the office if you need care or have questions about your care.   Have  a wonderful day and week. With Gratitude, Alvira Monday MSN, FNP-BC

## 2022-10-16 NOTE — Progress Notes (Signed)
Acute Office Visit  Subjective:    [name]., male    DOB: 03/17/1941, 82 y.o.   MRN: [id number]  Chief Complaint  Patient presents with   Cough    Pt reports coughing that is causing bilateral rib pain. Coughing since yesterday 10/15/2022.    HPI Patient is in today with complaints of bilateral rib pain when coughing.  His cough is nonproductive. He denies fever, chills, body aches, headaches, congestion, wheezing, shortness of breath, and difficulty breathing.  No reports of recent upper respiratory symptoms.  No reports of allergies.  She reports taking NyQuil with minimal relief of his symptoms.  Past Medical History:  Diagnosis Date   Aortic valve sclerosis    Atherosclerosis of coronary artery without angina pectoris 07/05/2014   Cervical spondylosis without myelopathy 02/13/2013   Chest pain 01/29/2019   Degeneration of cervical intervertebral disc 02/13/2013   Depression    sometimes   Diabetes mellitus type 2, diet-controlled (HCC)    Elevated troponin    GERD (gastroesophageal reflux disease)    Heart murmur    HTN (hypertension) 07/06/2013   Hypercholesterolemia 08/21/2015   Hyperlipidemia 07/06/2013   Medication management 08/21/2015   Pre-diabetes    Rectal bleeding 09/21/2019   Stiffness of left shoulder joint 07/19/2013    Past Surgical History:  Procedure Laterality Date   BIOPSY  12/14/2019   Procedure: BIOPSY;  Surgeon: Daneil Dolin, MD;  [facility];  Service: Endoscopy;;   CERVICAL SPINE SURGERY     COLONOSCOPY  07/26/2012   Procedure: COLONOSCOPY;  Surgeon: Jamesetta So, MD;  Location: [facility];  Service: Gastroenterology;  Laterality: N/A;   COLONOSCOPY WITH PROPOFOL N/A 12/14/2019   Procedure: COLONOSCOPY WITH PROPOFOL;  Surgeon: Daneil Dolin, MD;  Location: [facility];  Service: Endoscopy;  Laterality: N/A;  9:00am   KNEE ARTHROSCOPY Left    torn ligament   LUMBAR LAMINECTOMY/DECOMPRESSION MICRODISCECTOMY  Bilateral 12/27/2019   Procedure: BIATERAL LUMBAR TWO- LUMBAR THREE, LUMBAR THREE- LUMBAR FOUR, LUMBAR FOUR-FIVE LAMINOTOMY/FORAMINOTOMY 3 LEVELS;  Surgeon: Newman Pies, MD;  Location: [facility];  Service: Neurosurgery;  Laterality: Bilateral;  POSTERIOR   PVC ABLATION N/A 04/18/2021   Procedure: PVC ABLATION;  Surgeon: Vickie Epley, MD;  Location: [facility];  Service: Cardiovascular;  Laterality: N/A;   SPINE SURGERY N/A    Phreesia 02/24/2020    Family History  Problem Relation Age of Onset   Asthma Mother    Hypertension Father    Colon cancer Neg Hx     Social History   Socioeconomic History   Marital status: Widowed    Spouse name: Cherly    Number of children: 1   Years of education: Not on file   Highest education level: 12th grade  Occupational History    Comment: Retired   Tobacco Use   Smoking status: Never    Passive exposure: Never   Smokeless tobacco: Never  Vaping Use   Vaping Use: Never used  Substance and Sexual Activity   Alcohol use: No   Drug use: No   Sexual activity: Not Currently  Other Topics Concern   Not on file  Social History Narrative   Live alone right now, wife passed in 2009    [place] daughter-grandchild 2 live in [place]      Enjoys playing golf      Diet: eats what he likes, sweet, fast food, does not eat a lot of  canned or boxed foods.    Does not eat enough fruits and veggies.       Caffeine: 1 cup daily   Water: 3-4 bottles of water daily, Gatorade every now and then         Wears seat belt   Wears sun protection when golfing   Smoke detectors at home   Does not use phone while driving   Social Determinants of Health   Financial Resource Strain: Low Risk  (03/03/2021)   Overall Financial Resource Strain (CARDIA)    Difficulty of Paying Living Expenses: Not hard at all  Food Insecurity: No Food Insecurity (03/09/2022)   Hunger Vital Sign    Worried About Running Out of Food in the Last Year: Never true     Ran Out of Food in the Last Year: Never true  Transportation Needs: No Transportation Needs (03/09/2022)   PRAPARE - Hydrologist (Medical): No    Lack of Transportation (Non-Medical): No  Physical Activity: Inactive (03/09/2022)   Exercise Vital Sign    Days of Exercise per Week: 0 days    Minutes of Exercise per Session: 0 min  Stress: No Stress Concern Present (03/03/2021)   [place]    Feeling of Stress : Not at all  Social Connections: Moderately Isolated (03/09/2022)   Social Connection and Isolation Panel [NHANES]    Frequency of Communication with Friends and Family: More than three times a week    Frequency of Social Gatherings with Friends and Family: More than three times a week    Attends Religious Services: More than 4 times per year    Active Member of [employer] or Organizations: No    Attends Archivist Meetings: Never    Marital Status: Widowed  Intimate Partner Violence: Not At Risk (03/03/2021)   Humiliation, Afraid, Rape, and Kick questionnaire    Fear of Current or Ex-Partner: No    Emotionally Abused: No    Physically Abused: No    Sexually Abused: No    Outpatient Medications Prior to Visit  Medication Sig Dispense Refill   amiodarone (PACERONE) 200 MG tablet Take 1 tablet (200 mg total) by mouth daily. 90 tablet 2   amLODipine (NORVASC) 10 MG tablet Take 1 tablet (10 mg total) by mouth daily. 90 tablet 2   aspirin EC 81 MG tablet Take 81 mg by mouth daily.     benazepril (LOTENSIN) 10 MG tablet TAKE 1 TABLET EVERY DAY 90 tablet 3   blood glucose meter kit and supplies Dispense based on patient and insurance preference. Use up to four times daily as directed. (FOR ICD-10 E10.9, E11.9). 1 each 0   Evolocumab (REPATHA SURECLICK) [id number] MG/ML SOAJ INJECT 140 MG INTO THE SKIN EVERY 14 DAYS 6 mL 3   glipiZIDE (GLUCOTROL XL) 2.5 MG 24 hr tablet TAKE 1 TABLET EVERY DAY WITH  BREAKFAST 90 tablet 3   magnesium oxide (MAG-OX) 400 MG tablet 1 tablet as needed     meclizine (ANTIVERT) 25 MG tablet Take 1 tablet (25 mg total) by mouth 3 (three) times daily as needed for dizziness. 30 tablet 1   multivitamin-iron-minerals-folic acid (CENTRUM) chewable tablet Chew 1 tablet by mouth daily.     OVER THE COUNTER MEDICATION Apple cider vinegar and Ginger Gummy - take 1 gummy by mouth once daily     spironolactone-hydrochlorothiazide (ALDACTAZIDE) 25-25 MG tablet TAKE 1 TABLET EVERY OTHER DAY 45  tablet 3   No facility-administered medications prior to visit.    Allergies  Allergen Reactions   Metformin And Related Diarrhea and Nausea And Vomiting   Statins Other (See Comments)    Muscle/joint pain     Review of Systems  Constitutional:  Negative for fatigue and fever.  Eyes:  Negative for visual disturbance.  Respiratory:  Positive for cough. Negative for chest tightness and shortness of breath.   Cardiovascular:  Negative for chest pain and palpitations.  Neurological:  Negative for dizziness and headaches.       Objective:    Physical Exam HENT:     Head: Normocephalic.     Right Ear: External ear normal.     Left Ear: External ear normal.     Nose: No congestion or rhinorrhea.     Mouth/Throat:     Mouth: Mucous membranes are moist.  Cardiovascular:     Rate and Rhythm: Regular rhythm.     Heart sounds: No murmur heard. Pulmonary:     Effort: No respiratory distress.     Breath sounds: Normal breath sounds.  Chest:     Comments: No pain with palpation at the rib margin bilaterally Neurological:     Mental Status: He is alert.     BP 132/79   Pulse 71   Ht 5\' 6"  (1.676 m)   Wt 208 lb 0.6 oz (94.4 kg)   SpO2 96%   BMI 33.58 kg/m  Wt Readings from Last 3 Encounters:  10/16/22 208 lb 0.6 oz (94.4 kg)  07/27/22 210 lb (95.3 kg)  07/24/22 210 lb 12.8 oz (95.6 kg)       Assessment & Plan:  Cough in adult Assessment & Plan: Symptoms  likely of costochondritis Encourage heat application to the affected site Encourage stretching exercises and rest Encouraged to take over-the-counter analgesics as needed Robitussin was ordered for cough  Orders: -     guaiFENesin; Take 5 mLs by mouth every 4 (four) hours as needed for cough or to loosen phlegm.  Dispense: 120 mL; Refill: 0    Alvira Monday, FNP

## 2022-10-16 NOTE — Assessment & Plan Note (Addendum)
Symptoms likely of costochondritis Encourage heat application to the affected site Encourage stretching exercises and rest Encouraged to take over-the-counter analgesics as needed Robitussin was ordered for cough

## 2022-10-19 DIAGNOSIS — M25661 Stiffness of right knee, not elsewhere classified: Secondary | ICD-10-CM | POA: Diagnosis not present

## 2022-10-19 DIAGNOSIS — M62561 Muscle wasting and atrophy, not elsewhere classified, right lower leg: Secondary | ICD-10-CM | POA: Diagnosis not present

## 2022-10-19 DIAGNOSIS — M25561 Pain in right knee: Secondary | ICD-10-CM | POA: Diagnosis not present

## 2022-10-22 DIAGNOSIS — M25661 Stiffness of right knee, not elsewhere classified: Secondary | ICD-10-CM | POA: Diagnosis not present

## 2022-10-22 DIAGNOSIS — M62561 Muscle wasting and atrophy, not elsewhere classified, right lower leg: Secondary | ICD-10-CM | POA: Diagnosis not present

## 2022-10-22 DIAGNOSIS — M25561 Pain in right knee: Secondary | ICD-10-CM | POA: Diagnosis not present

## 2022-10-22 DIAGNOSIS — R2689 Other abnormalities of gait and mobility: Secondary | ICD-10-CM | POA: Diagnosis not present

## 2022-10-23 ENCOUNTER — Ambulatory Visit: Payer: PRIVATE HEALTH INSURANCE | Admitting: Internal Medicine

## 2022-10-26 DIAGNOSIS — R2689 Other abnormalities of gait and mobility: Secondary | ICD-10-CM | POA: Diagnosis not present

## 2022-10-26 DIAGNOSIS — M25561 Pain in right knee: Secondary | ICD-10-CM | POA: Diagnosis not present

## 2022-10-26 DIAGNOSIS — M62561 Muscle wasting and atrophy, not elsewhere classified, right lower leg: Secondary | ICD-10-CM | POA: Diagnosis not present

## 2022-10-26 DIAGNOSIS — M25661 Stiffness of right knee, not elsewhere classified: Secondary | ICD-10-CM | POA: Diagnosis not present

## 2022-10-28 DIAGNOSIS — M25661 Stiffness of right knee, not elsewhere classified: Secondary | ICD-10-CM | POA: Diagnosis not present

## 2022-10-28 DIAGNOSIS — M62561 Muscle wasting and atrophy, not elsewhere classified, right lower leg: Secondary | ICD-10-CM | POA: Diagnosis not present

## 2022-10-28 DIAGNOSIS — R2689 Other abnormalities of gait and mobility: Secondary | ICD-10-CM | POA: Diagnosis not present

## 2022-10-28 DIAGNOSIS — M25561 Pain in right knee: Secondary | ICD-10-CM | POA: Diagnosis not present

## 2022-11-04 DIAGNOSIS — M25561 Pain in right knee: Secondary | ICD-10-CM | POA: Diagnosis not present

## 2022-11-04 DIAGNOSIS — R2689 Other abnormalities of gait and mobility: Secondary | ICD-10-CM | POA: Diagnosis not present

## 2022-11-04 DIAGNOSIS — M62561 Muscle wasting and atrophy, not elsewhere classified, right lower leg: Secondary | ICD-10-CM | POA: Diagnosis not present

## 2022-11-04 DIAGNOSIS — M25661 Stiffness of right knee, not elsewhere classified: Secondary | ICD-10-CM | POA: Diagnosis not present

## 2022-11-05 ENCOUNTER — Encounter: Payer: Self-pay | Admitting: Internal Medicine

## 2022-11-05 ENCOUNTER — Ambulatory Visit (INDEPENDENT_AMBULATORY_CARE_PROVIDER_SITE_OTHER): Payer: Medicare HMO | Admitting: Internal Medicine

## 2022-11-05 VITALS — BP 127/66 | HR 76 | Ht 66.0 in | Wt 207.0 lb

## 2022-11-05 DIAGNOSIS — E559 Vitamin D deficiency, unspecified: Secondary | ICD-10-CM

## 2022-11-05 DIAGNOSIS — G4709 Other insomnia: Secondary | ICD-10-CM

## 2022-11-05 DIAGNOSIS — M1711 Unilateral primary osteoarthritis, right knee: Secondary | ICD-10-CM

## 2022-11-05 DIAGNOSIS — F5104 Psychophysiologic insomnia: Secondary | ICD-10-CM

## 2022-11-05 DIAGNOSIS — F321 Major depressive disorder, single episode, moderate: Secondary | ICD-10-CM | POA: Diagnosis not present

## 2022-11-05 MED ORDER — TRAZODONE HCL 50 MG PO TABS: 50.0000 mg | ORAL_TABLET | Freq: Every evening | ORAL | 3 refills | Status: DC | PRN

## 2022-11-05 NOTE — Patient Instructions (Signed)
It was a pleasure to see you today.  Thank you for giving [place] the opportunity to be involved in your care.  Below is a brief recap of your visit and next steps.  We will plan to see you again in 3 months.   Summary Start trazodone 50 mg nightly for sleep and depression We will plan for follow up in 3 months and repeat labs at that time I recommend starting daily vitamin D3 supplementation  - 2000 IUs

## 2022-11-05 NOTE — Progress Notes (Signed)
Established Patient Office Visit  Subjective   Patient ID: Douglas Byrnes., male    DOB: March 29, 1941  Age: 82 y.o. MRN: [id number]  Chief Complaint  Patient presents with   Hypertension    Follow up   Douglas Keller returns to care today for follow-up.  He was last evaluated by me on 1/5.  No medication changes were made at that time and 42-month follow-up was arranged.  In the interim, he underwent right TKA in February.  His postoperative course has been uncomplicated.  Douglas Keller acute concern today is depression and insomnia.  His PHQ-9 score is elevated (12).  Denies SI/HI.  He believes that he was able to get some sleep that this may help with his depression.  He has no additional concerns to discuss today.  Past Medical History:  Diagnosis Date   Aortic valve sclerosis    Atherosclerosis of coronary artery without angina pectoris 07/05/2014   Cervical spondylosis without myelopathy 02/13/2013   Chest pain 01/29/2019   Degeneration of cervical intervertebral disc 02/13/2013   Depression    sometimes   Diabetes mellitus type 2, diet-controlled    Elevated troponin    GERD (gastroesophageal reflux disease)    Heart murmur    HTN (hypertension) 07/06/2013   Hypercholesterolemia 08/21/2015   Hyperlipidemia 07/06/2013   Medication management 08/21/2015   Pre-diabetes    Rectal bleeding 09/21/2019   Stiffness of left shoulder joint 07/19/2013   Past Surgical History:  Procedure Laterality Date   BIOPSY  12/14/2019   Procedure: BIOPSY;  Surgeon: Corbin Ade, MD;  Location: [facility];  Service: Endoscopy;;   CERVICAL SPINE SURGERY     COLONOSCOPY  07/26/2012   Procedure: COLONOSCOPY;  Surgeon: Dalia Heading, MD;  Location: [facility];  Service: Gastroenterology;  Laterality: N/A;   COLONOSCOPY WITH PROPOFOL N/A 12/14/2019   Procedure: COLONOSCOPY WITH PROPOFOL;  Surgeon: Corbin Ade, MD;  Location: [facility];  Service: Endoscopy;  Laterality: N/A;  9:00am   KNEE  ARTHROSCOPY Left    torn ligament   LUMBAR LAMINECTOMY/DECOMPRESSION MICRODISCECTOMY Bilateral 12/27/2019   Procedure: BIATERAL LUMBAR TWO- LUMBAR THREE, LUMBAR THREE- LUMBAR FOUR, LUMBAR FOUR-FIVE LAMINOTOMY/FORAMINOTOMY 3 LEVELS;  Surgeon: Tressie Stalker, MD;  Location: [facility];  Service: Neurosurgery;  Laterality: Bilateral;  POSTERIOR   PVC ABLATION N/A 04/18/2021   Procedure: PVC ABLATION;  Surgeon: Lanier Prude, MD;  Location: [facility];  Service: Cardiovascular;  Laterality: N/A;   REPLACEMENT TOTAL KNEE Right 09/02/2022   SPINE SURGERY N/A    Phreesia 02/24/2020   Social History   Tobacco Use   Smoking status: Never    Passive exposure: Never   Smokeless tobacco: Never  Vaping Use   Vaping Use: Never used  Substance Use Topics   Alcohol use: No   Drug use: No   Family History  Problem Relation Age of Onset   Asthma Mother    Hypertension Father    Colon cancer Neg Hx    Allergies  Allergen Reactions   Metformin And Related Diarrhea and Nausea And Vomiting   Statins Other (See Comments)    Muscle/joint pain    Review of Systems  Psychiatric/Behavioral:  Positive for depression. The patient has insomnia.   All other systems reviewed and are negative.    Objective:     BP 127/66   Pulse 76   Ht  (1.676 m)   Wt 207 lb (93.9 kg)   SpO2  95%   BMI 33.41 kg/m  BP Readings from Last 3 Encounters:  11/05/22 127/66  10/16/22 132/79  07/27/22 133/68   Physical Exam Vitals reviewed.  Constitutional:      General: He is not in acute distress.    Appearance: Normal appearance. He is obese. He is not ill-appearing.  HENT:     Head: Normocephalic and atraumatic.     Right Ear: External ear normal.     Left Ear: External ear normal.     Nose: Nose normal. No congestion or rhinorrhea.     Mouth/Throat:     Mouth: Mucous membranes are moist.     Pharynx: Oropharynx is clear.  Eyes:     General: No scleral icterus.    Extraocular Movements:  Extraocular movements intact.     Conjunctiva/sclera: Conjunctivae normal.     Pupils: Pupils are equal, round, and reactive to light.  Cardiovascular:     Rate and Rhythm: Normal rate and regular rhythm.     Pulses: Normal pulses.     Heart sounds: Murmur heard.  Pulmonary:     Effort: Pulmonary effort is normal.     Breath sounds: Normal breath sounds. No wheezing, rhonchi or rales.  Abdominal:     General: Abdomen is flat. Bowel sounds are normal. There is no distension.     Palpations: Abdomen is soft.     Tenderness: There is no abdominal tenderness.  Musculoskeletal:        General: No swelling or deformity. Normal range of motion.     Cervical back: Normal range of motion.  Skin:    General: Skin is warm and dry.     Capillary Refill: Capillary refill takes less than 2 seconds.     Comments: Well-healed vertical surgical incision present over anterior aspect of the right knee  Neurological:     General: No focal deficit present.     Mental Status: He is alert and oriented to person, place, and time.     Motor: No weakness.  Psychiatric:        Mood and Affect: Mood normal.        Behavior: Behavior normal.        Thought Content: Thought content normal.   Last CBC Lab Results  Component Value Date   WBC 8.3 07/24/2022   HGB 14.7 07/24/2022   HCT 43.7 07/24/2022   MCV 92 07/24/2022   MCH 30.9 07/24/2022   RDW 13.4 07/24/2022   PLT 309 07/24/2022   Last metabolic panel Lab Results  Component Value Date   GLUCOSE 77 07/24/2022   NA 141 07/24/2022   K 4.1 07/24/2022   CL 104 07/24/2022   CO2 22 07/24/2022   BUN 14 07/24/2022   CREATININE 1.20 07/24/2022   EGFR 61 07/24/2022   CALCIUM 8.8 07/24/2022   PROT 7.1 07/24/2022   ALBUMIN 4.3 07/24/2022   LABGLOB 2.8 07/24/2022   AGRATIO 1.5 07/24/2022   BILITOT 0.5 07/24/2022   ALKPHOS 100 07/24/2022   AST 26 07/24/2022   ALT 56 (H) 07/24/2022   ANIONGAP 7 03/11/2022   Last lipids Lab Results  Component  Value Date   CHOL 152 07/24/2022   HDL 43 07/24/2022   LDLCALC 72 07/24/2022   TRIG 224 (H) 07/24/2022   CHOLHDL 3.5 07/24/2022   Last hemoglobin A1c Lab Results  Component Value Date   HGBA1C 6.2 (H) 07/24/2022   Last thyroid functions Lab Results  Component Value Date   TSH 1.770  07/24/2022   Last vitamin D Lab Results  Component Value Date   VD25OH 21.0 (L) 07/24/2022   Last vitamin B12 and Folate Lab Results  Component Value Date   VITAMINB12 559 07/24/2022   FOLATE 16.1 07/24/2022      Assessment & Plan:   Problem List Items Addressed This Visit       Osteoarthritis of right knee    S/p right TKA in February with Dr. Luiz Blare.  His postoperative course has been uncomplicated.  There is a well-healed vertical surgical incision present on the anterior aspect of the right knee.      Depression, major, single episode, moderate    Previously documented history of MDD.  His PHQ-9 score is elevated today (12).  Denies SI/HI.  Believes that if he is able to get some sleep that his mood will improve. -Start trazodone 50 mg nightly      Other insomnia    His acute concern today is insomnia, which is worsening his depression.  He is not taking any sleep aids currently. -Start trazodone 50 mg nightly      Vitamin D insufficiency    Noted on recent labs.  I have recommended daily vitamin D supplementation today (2000 IUs).      Return in about 3 months (around 02/04/2023).   Billie Lade, MD

## 2022-11-06 DIAGNOSIS — M25661 Stiffness of right knee, not elsewhere classified: Secondary | ICD-10-CM | POA: Diagnosis not present

## 2022-11-06 DIAGNOSIS — R2689 Other abnormalities of gait and mobility: Secondary | ICD-10-CM | POA: Diagnosis not present

## 2022-11-06 DIAGNOSIS — M25561 Pain in right knee: Secondary | ICD-10-CM | POA: Diagnosis not present

## 2022-11-06 DIAGNOSIS — M62561 Muscle wasting and atrophy, not elsewhere classified, right lower leg: Secondary | ICD-10-CM | POA: Diagnosis not present

## 2022-11-09 DIAGNOSIS — E559 Vitamin D deficiency, unspecified: Secondary | ICD-10-CM | POA: Insufficient documentation

## 2022-11-09 NOTE — Assessment & Plan Note (Signed)
Noted on recent labs.  I have recommended daily vitamin D supplementation today (2000 IUs).

## 2022-11-09 NOTE — Assessment & Plan Note (Signed)
Previously documented history of MDD.  His PHQ-9 score is elevated today (12).  Denies SI/HI.  Believes that if he is able to get some sleep that his mood will improve. -Start trazodone 50 mg nightly

## 2022-11-09 NOTE — Assessment & Plan Note (Signed)
His acute concern today is insomnia, which is worsening his depression.  He is not taking any sleep aids currently. -Start trazodone 50 mg nightly

## 2022-11-09 NOTE — Assessment & Plan Note (Signed)
S/p right TKA in February with Dr. Luiz Blare.  His postoperative course has been uncomplicated.  There is a well-healed vertical surgical incision present on the anterior aspect of the right knee.

## 2022-11-19 ENCOUNTER — Telehealth: Payer: Self-pay | Admitting: Cardiovascular Disease

## 2022-11-19 NOTE — Telephone Encounter (Signed)
Patient states starting med 2 weeks ago.  States has bad taste in mouth since taking the medication. No dry mouth. Continues with dizziness which has been for years (more than 5). No energy but this was also before the medication.  Patient states since med he has pitting swelling to bil feet and ankles.  States this started with the new medication.  No SOB. Takes spironolactone QOD. The medication has helped him sleep better but the side effects of swelling and bad taste are too much Patient states he also read that this medication is not good with amiodarone and aspirin so was concerned

## 2022-11-19 NOTE — Telephone Encounter (Signed)
Pt c/o medication issue:  1. Name of Medication: Traxodone  2. How are you currently taking this medication (dosage and times per day)?  1 time at night  3. Are you having a reaction (difficulty breathing--STAT)?   4. What is your medication issue? Swelling , bad taste in his mouth when he wakes up in the morning, and still dizzy- wonder ic this could be coming from taking this medicine

## 2022-11-19 NOTE — Telephone Encounter (Signed)
I would suggest skipping the medication for 3-4 days. If symptoms imptrove, then that may prove it is this medication. However, leg swelling would not be typically associated with trazodone.

## 2022-11-19 NOTE — Telephone Encounter (Signed)
I would suggest skipping the medication for 3-4 days. If symptoms improve, then that may prove it is this medication. However, leg swelling would not be typically associated with trazodone.   Called pt and gave the message above. He verbalized understanding. Instructed him to let us and/or the prescribing provider know how this went.

## 2022-11-24 DIAGNOSIS — M1711 Unilateral primary osteoarthritis, right knee: Secondary | ICD-10-CM | POA: Diagnosis not present

## 2022-12-15 ENCOUNTER — Telehealth: Payer: Self-pay | Admitting: Internal Medicine

## 2022-12-15 NOTE — Telephone Encounter (Addendum)
Pt called and stated that Trazodone caused his ankles to swell, so he stopped taking it. He is requesting  a new mediation.

## 2022-12-15 NOTE — Telephone Encounter (Deleted)
[id number]

## 2022-12-18 ENCOUNTER — Ambulatory Visit (INDEPENDENT_AMBULATORY_CARE_PROVIDER_SITE_OTHER): Payer: Medicare HMO | Admitting: Internal Medicine

## 2022-12-18 ENCOUNTER — Encounter: Payer: Self-pay | Admitting: Internal Medicine

## 2022-12-18 VITALS — BP 178/77 | HR 77 | Ht 66.0 in | Wt 209.6 lb

## 2022-12-18 DIAGNOSIS — F5104 Psychophysiologic insomnia: Secondary | ICD-10-CM | POA: Diagnosis not present

## 2022-12-18 DIAGNOSIS — G4709 Other insomnia: Secondary | ICD-10-CM | POA: Diagnosis not present

## 2022-12-18 MED ORDER — ESZOPICLONE 1 MG PO TABS: 1.0000 mg | ORAL_TABLET | Freq: Every evening | ORAL | 0 refills | Status: DC | PRN

## 2022-12-18 NOTE — Progress Notes (Signed)
Acute Office Visit  Subjective:     Patient ID: Douglas Fantasia., male    DOB: 1941/04/09, 82 y.o.   MRN: [id number]  Chief Complaint  Patient presents with   Insomnia    Needs medication   Douglas Keller presents for an acute visit today to discuss insomnia.  This is a chronic issue.  He has tried multiple medications previously, most recently he has been prescribed trazodone, however he states that while effective, trazodone caused his ankles to swell.  Ankle edema resolved within 2 weeks of being off medication.  He continues to endorse difficulty both falling and staying asleep.  A friend recently recommended temazepam.  He is interested in starting this today.  Review of Systems  Psychiatric/Behavioral:  The patient has insomnia.   All other systems reviewed and are negative.     Objective:    BP (!) 178/77   Pulse 77   Ht 5\' 6"  (1.676 m)   Wt 209 lb 9.6 oz (95.1 kg)   SpO2 92%   BMI 33.83 kg/m  BP Readings from Last 3 Encounters:  12/18/22 (!) 178/77  11/05/22 127/66  10/16/22 132/79   Physical Exam Vitals reviewed.  Constitutional:      General: He is not in acute distress.    Appearance: Normal appearance. He is obese. He is not ill-appearing.  HENT:     Head: Normocephalic and atraumatic.     Right Ear: External ear normal.     Left Ear: External ear normal.     Nose: Nose normal. No congestion or rhinorrhea.     Mouth/Throat:     Mouth: Mucous membranes are moist.     Pharynx: Oropharynx is clear.  Eyes:     General: No scleral icterus.    Extraocular Movements: Extraocular movements intact.     Conjunctiva/sclera: Conjunctivae normal.     Pupils: Pupils are equal, round, and reactive to light.  Cardiovascular:     Rate and Rhythm: Normal rate and regular rhythm.     Pulses: Normal pulses.     Heart sounds: Murmur heard.  Pulmonary:     Effort: Pulmonary effort is normal.     Breath sounds: Normal breath sounds. No wheezing, rhonchi or rales.   Abdominal:     General: Abdomen is flat. Bowel sounds are normal. There is no distension.     Palpations: Abdomen is soft.     Tenderness: There is no abdominal tenderness.  Musculoskeletal:        General: No swelling or deformity. Normal range of motion.     Cervical back: Normal range of motion.  Skin:    General: Skin is warm and dry.     Capillary Refill: Capillary refill takes less than 2 seconds.  Neurological:     General: No focal deficit present.     Mental Status: He is alert and oriented to person, place, and time.     Motor: No weakness.  Psychiatric:        Mood and Affect: Mood normal.        Behavior: Behavior normal.        Thought Content: Thought content normal.       Assessment & Plan:   Problem List Items Addressed This Visit       Other insomnia    Presenting today for an acute visit to discuss insomnia.  He has most recently been prescribed trazodone 50 mg nightly but experienced ankle edema while on medication.  He has previously tried hydroxyzine and melatonin as well.  A friend has recently recommended temazepam.  He is interested in starting this today. -I reviewed with Douglas Keller my concerns with starting a benzodiazepine for treatment of insomnia and instead recommended Lunesta.  He is in agreement with trialing this today.  Lunesta 1 mg nightly x 30 tablets has been prescribed.  Controlled substance agreement signed today. -He will return to care if his symptoms worsen or fail to improve.  Otherwise he is scheduled for follow-up with me in July.      Meds ordered this encounter  Medications   eszopiclone (LUNESTA) 1 MG TABS tablet    Sig: Take 1 tablet (1 mg total) by mouth at bedtime as needed for sleep. Take immediately before bedtime    Dispense:  30 tablet    Refill:  0    Return if symptoms worsen or fail to improve.  Billie Lade, MD

## 2022-12-18 NOTE — Telephone Encounter (Signed)
Appt made for today at 3:40

## 2022-12-18 NOTE — Assessment & Plan Note (Signed)
Presenting today for an acute visit to discuss insomnia.  He has most recently been prescribed trazodone 50 mg nightly but experienced ankle edema while on medication.  He has previously tried hydroxyzine and melatonin as well.  A friend has recently recommended temazepam.  He is interested in starting this today. -I reviewed with Douglas Keller my concerns with starting a benzodiazepine for treatment of insomnia and instead recommended Lunesta.  He is in agreement with trialing this today.  Lunesta 1 mg nightly x 30 tablets has been prescribed.  Controlled substance agreement signed today. -He will return to care if his symptoms worsen or fail to improve.  Otherwise he is scheduled for follow-up with me in July.

## 2022-12-18 NOTE — Patient Instructions (Signed)
It was a pleasure to see you today.  Thank you for giving [place] the opportunity to be involved in your care.  Below is a brief recap of your visit and next steps.  We will plan to see you again in July.  Summary Start Lunesta 1 mg nightly for sleep Follow up in July as scheduled but return to care before then if needed

## 2023-02-04 ENCOUNTER — Encounter: Payer: Self-pay | Admitting: Internal Medicine

## 2023-02-04 ENCOUNTER — Ambulatory Visit (INDEPENDENT_AMBULATORY_CARE_PROVIDER_SITE_OTHER): Payer: Medicare HMO | Admitting: Internal Medicine

## 2023-02-04 VITALS — BP 126/76 | HR 67 | Ht 66.0 in | Wt 206.6 lb

## 2023-02-04 DIAGNOSIS — E785 Hyperlipidemia, unspecified: Secondary | ICD-10-CM

## 2023-02-04 DIAGNOSIS — G4733 Obstructive sleep apnea (adult) (pediatric): Secondary | ICD-10-CM | POA: Diagnosis not present

## 2023-02-04 DIAGNOSIS — E559 Vitamin D deficiency, unspecified: Secondary | ICD-10-CM

## 2023-02-04 DIAGNOSIS — E1169 Type 2 diabetes mellitus with other specified complication: Secondary | ICD-10-CM

## 2023-02-04 DIAGNOSIS — G4709 Other insomnia: Secondary | ICD-10-CM | POA: Diagnosis not present

## 2023-02-04 DIAGNOSIS — Z79899 Other long term (current) drug therapy: Secondary | ICD-10-CM

## 2023-02-04 MED ORDER — ESZOPICLONE 2 MG PO TABS
2.0000 mg | ORAL_TABLET | Freq: Every evening | ORAL | 0 refills | Status: DC | PRN
Start: 2023-02-04 — End: 2023-03-01

## 2023-02-04 NOTE — Assessment & Plan Note (Signed)
Persistent issue.  He has most recently been prescribed Lunesta 1 mg nightly but states that this was ineffective. -Through shared decision making, Alfonso Patten has been increased to 2 mg nightly.  Can further increase to 3 mg if needed.  We also discussed the importance of treating OSA as this may be contributing to his inability to stay asleep.  Sleep medicine referral placed today.

## 2023-02-04 NOTE — Progress Notes (Signed)
Established Patient Office Visit  Subjective   Patient ID: Douglas Bobier., male    DOB: 04-Nov-1940  Age: 82 y.o. MRN: [id number]  Chief Complaint  Patient presents with   Insomnia    Follow up   Douglas Keller returns to care today for routine follow-up.  He was last evaluated by me on 5/31 for an acute visit in the setting of insomnia.  Lunesta 1 mg nightly was prescribed.  There have been no acute interval events. Douglas Keller reports feeling fairly well today.  He continues to endorse insomnia and states that Douglas Keller has been ineffective.  He is interested in additional medication options.  He additionally revealed that he returned his CPAP machine as he did not feel that wearing CPAP at night was beneficial, noting that it did not help him sleep.  Past Medical History:  Diagnosis Date   Aortic valve sclerosis    Atherosclerosis of coronary artery without angina pectoris 07/05/2014   Cervical spondylosis without myelopathy 02/13/2013   Chest pain 01/29/2019   Degeneration of cervical intervertebral disc 02/13/2013   Depression    sometimes   Diabetes mellitus type 2, diet-controlled (HCC)    Elevated troponin    GERD (gastroesophageal reflux disease)    Heart murmur    HTN (hypertension) 07/06/2013   Hypercholesterolemia 08/21/2015   Hyperlipidemia 07/06/2013   Medication management 08/21/2015   Pre-diabetes    Rectal bleeding 09/21/2019   Stiffness of left shoulder joint 07/19/2013   Past Surgical History:  Procedure Laterality Date   BIOPSY  12/14/2019   Procedure: BIOPSY;  Surgeon: Corbin Ade, MD;  [facility];  Service: Endoscopy;;   CERVICAL SPINE SURGERY     COLONOSCOPY  07/26/2012   Procedure: COLONOSCOPY;  Surgeon: Dalia Heading, MD;  [facility]: [facility];  Service: Gastroenterology;  Laterality: N/A;   COLONOSCOPY WITH PROPOFOL N/A 12/14/2019   Procedure: COLONOSCOPY WITH PROPOFOL;  Surgeon: Corbin Ade, MD;  [facility]: [facility];  Service:  Endoscopy;  Laterality: N/A;  9:00am   KNEE ARTHROSCOPY Left    torn ligament   LUMBAR LAMINECTOMY/DECOMPRESSION MICRODISCECTOMY Bilateral 12/27/2019   Procedure: BIATERAL LUMBAR TWO- LUMBAR THREE, LUMBAR THREE- LUMBAR FOUR, LUMBAR FOUR-FIVE LAMINOTOMY/FORAMINOTOMY 3 LEVELS;  Surgeon: Tressie Stalker, MD;  Location: [facility] OR;  Service: Neurosurgery;  Laterality: Bilateral;  POSTERIOR   PVC ABLATION N/A 04/18/2021   Procedure: PVC ABLATION;  Surgeon: Lanier Prude, MD;  Location: [facility];  Service: Cardiovascular;  Laterality: N/A;   REPLACEMENT TOTAL KNEE Right 09/02/2022   SPINE SURGERY N/A    [employer]reesia 02/24/2020   Social History   Tobacco Use   Smoking status: Never    Passive exposure: Never   Smokeless tobacco: Never  Vaping Use   Vaping status: Never Used  Substance Use Topics   Alcohol use: No   Drug use: No   Family History  Problem Relation Age of Onset   Asthma Mother    Hypertension Father    Colon cancer Neg Hx    Allergies  Allergen Reactions   Metformin And Related Diarrhea and Nausea And Vomiting   Statins Other (See Comments)    Muscle/joint pain    Review of Systems  Psychiatric/Behavioral:  The patient has insomnia.   All other systems reviewed and are negative.    Objective:     BP 126/76   Pulse 67   Ht 5\' 6"  (1.676 m)   Wt 206 lb 9.6 oz (  93.7 kg)   SpO2 93%   BMI 33.35 kg/m  BP Readings from Last 3 Encounters:  02/04/23 126/76  12/18/22 (!) 178/77  11/05/22 127/66   Physical Exam Vitals reviewed.  Constitutional:      General: He is not in acute distress.    Appearance: Normal appearance. He is obese. He is not ill-appearing.  HENT:     Head: Normocephalic and atraumatic.     Right Ear: External ear normal.     Left Ear: External ear normal.     Nose: Nose normal. No congestion or rhinorrhea.     Mouth/Throat:     Mouth: Mucous membranes are moist.     Pharynx: Oropharynx is clear.  Eyes:     General: No scleral  icterus.    Extraocular Movements: Extraocular movements intact.     Conjunctiva/sclera: Conjunctivae normal.     Pupils: Pupils are equal, round, and reactive to light.  Cardiovascular:     Rate and Rhythm: Normal rate and regular rhythm.     Pulses: Normal pulses.     Heart sounds: Murmur heard.  Pulmonary:     Effort: Pulmonary effort is normal.     Breath sounds: Normal breath sounds. No wheezing, rhonchi or rales.  Abdominal:     General: Abdomen is flat. Bowel sounds are normal. There is no distension.     Palpations: Abdomen is soft.     Tenderness: There is no abdominal tenderness.  Musculoskeletal:        General: No swelling or deformity. Normal range of motion.     Cervical back: Normal range of motion.  Skin:    General: Skin is warm and dry.     Capillary Refill: Capillary refill takes less than 2 seconds.  Neurological:     General: No focal deficit present.     Mental Status: He is alert and oriented to person, place, and time.     Motor: No weakness.  Psychiatric:        Mood and Affect: Mood normal.        Behavior: Behavior normal.        Thought Content: Thought content normal.   Last CBC Lab Results  Component Value Date   WBC 8.3 07/24/2022   HGB 14.7 07/24/2022   HCT 43.7 07/24/2022   MCV 92 07/24/2022   MCH 30.9 07/24/2022   RDW 13.4 07/24/2022   PLT 309 07/24/2022   Last metabolic panel Lab Results  Component Value Date   GLUCOSE 77 07/24/2022   NA 141 07/24/2022   K 4.1 07/24/2022   CL 104 07/24/2022   CO2 22 07/24/2022   BUN 14 07/24/2022   CREATININE 1.20 07/24/2022   EGFR 61 07/24/2022   CALCIUM 8.8 07/24/2022   PROT 7.1 07/24/2022   ALBUMIN 4.3 07/24/2022   LABGLOB 2.8 07/24/2022   AGRATIO 1.5 07/24/2022   BILITOT 0.5 07/24/2022   ALKPHOS 100 07/24/2022   AST 26 07/24/2022   ALT 56 (H) 07/24/2022   ANIONGAP 7 03/11/2022   Last lipids Lab Results  Component Value Date   CHOL 152 07/24/2022   HDL 43 07/24/2022   LDLCALC 72  07/24/2022   TRIG 224 (H) 07/24/2022   CHOLHDL 3.5 07/24/2022   Last hemoglobin A1c Lab Results  Component Value Date   HGBA1C 6.2 (H) 07/24/2022   Last thyroid functions Lab Results  Component Value Date   TSH 1.770 07/24/2022   Last vitamin D Lab Results  Component Value  Date   VD25OH 21.0 (L) 07/24/2022   Last vitamin B12 and Folate Lab Results  Component Value Date   VITAMINB12 559 07/24/2022   FOLATE 16.1 07/24/2022     Assessment & Plan:   Problem List Items Addressed This Visit       Obstructive sleep apnea (adult) (pediatric) - Primary    History of OSA.  Currently untreated as he has returned his CPAP machine.  States that he used it for approximately 2 weeks and did not find it beneficial. -We discussed the dangers of untreated sleep apnea as well as the possibility that it is contributing to his insomnia.  He is willing to try CPAP again and states that he is also potentially interested in inspire.  I have placed a new sleep medicine referral today.      Type 2 diabetes mellitus with hyperlipidemia (HCC)    A1c 6.2 in January.  He is currently prescribed glipizide 2.5 mg daily. -Repeat A1c and urine microalbumin/creatinine ratio have been ordered today      Other insomnia    Persistent issue.  He has most recently been prescribed Lunesta 1 mg nightly but states that this was ineffective. -Through shared decision making, Douglas Keller has been increased to 2 mg nightly.  Can further increase to 3 mg if needed.  We also discussed the importance of treating OSA as this may be contributing to his inability to stay asleep.  Sleep medicine referral placed today.      Long term current use of amiodarone    Repeat labs ordered today      Return in about 3 months (around 05/07/2023).   Billie Lade, MD

## 2023-02-04 NOTE — Assessment & Plan Note (Signed)
A1c 6.2 in January.  He is currently prescribed glipizide 2.5 mg daily. -Repeat A1c and urine microalbumin/creatinine ratio have been ordered today

## 2023-02-04 NOTE — Patient Instructions (Signed)
It was a pleasure to see you today.  Thank you for giving [place] the opportunity to be involved in your care.  Below is a brief recap of your visit and next steps.  We will plan to see you again in 3 months.  Summary Increase Lunesta to 2 mg nightly Sleep medicine referral placed Repeat labs ordered Follow up in 3 months

## 2023-02-04 NOTE — Assessment & Plan Note (Signed)
Repeat labs ordered today 

## 2023-02-04 NOTE — Assessment & Plan Note (Signed)
History of OSA.  Currently untreated as he has returned his CPAP machine.  States that he used it for approximately 2 weeks and did not find it beneficial. -We discussed the dangers of untreated sleep apnea as well as the possibility that it is contributing to his insomnia.  He is willing to try CPAP again and states that he is also potentially interested in inspire.  I have placed a new sleep medicine referral today.

## 2023-02-05 LAB — MICROALBUMIN / CREATININE URINE RATIO

## 2023-02-05 LAB — CMP14+EGFR
ALT: 21 IU/L (ref 0–44)
BUN: 16 mg/dL (ref 8–27)
Chloride: 106 mmol/L (ref 96–106)
Creatinine, Ser: 1.24 mg/dL (ref 0.76–1.27)
Globulin, Total: 2.6 g/dL (ref 1.5–4.5)
Glucose: 93 mg/dL (ref 70–99)
Sodium: 143 mmol/L (ref 134–144)
eGFR: 58 mL/min/{1.73_m2} — ABNORMAL LOW (ref >59)

## 2023-02-05 LAB — TSH+FREE T4
Free T4: 1.39 ng/dL (ref 0.82–1.77)
TSH: 2.16 u[IU]/mL (ref 0.450–4.500)

## 2023-02-05 LAB — HEMOGLOBIN A1C: Hgb A1c MFr Bld: 6.3 % — ABNORMAL HIGH (ref 4.8–5.6)

## 2023-02-06 LAB — CMP14+EGFR
AST: 26 IU/L (ref 0–40)
Albumin: 4.6 g/dL (ref 3.7–4.7)
Alkaline Phosphatase: 78 IU/L (ref 44–121)
BUN/Creatinine Ratio: 13 (ref 10–24)
Bilirubin Total: 1 mg/dL (ref 0.0–1.2)
CO2: 22 mmol/L (ref 20–29)
Calcium: 9.3 mg/dL (ref 8.6–10.2)
Potassium: 4.5 mmol/L (ref 3.5–5.2)
Total Protein: 7.2 g/dL (ref 6.0–8.5)

## 2023-02-06 LAB — HEMOGLOBIN A1C: Est. average glucose Bld gHb Est-mCnc: 134 mg/dL

## 2023-02-06 LAB — VITAMIN D 25 HYDROXY (VIT D DEFICIENCY, FRACTURES): Vit D, 25-Hydroxy: 43.4 ng/mL (ref 30.0–100.0)

## 2023-02-18 ENCOUNTER — Other Ambulatory Visit: Payer: Self-pay | Admitting: Cardiology

## 2023-02-18 ENCOUNTER — Telehealth: Payer: Self-pay | Admitting: Internal Medicine

## 2023-02-18 ENCOUNTER — Other Ambulatory Visit: Payer: Self-pay

## 2023-02-18 MED ORDER — FREESTYLE LIBRE 3 SENSOR MISC: 1.0000 | 12 refills | Status: DC

## 2023-02-18 NOTE — Telephone Encounter (Signed)
Patient aware.

## 2023-02-18 NOTE — Telephone Encounter (Signed)
Patient called said his blood sugar keeps dropping and he drank some orange juice blood sugar came up he just need to know what to do

## 2023-02-23 ENCOUNTER — Ambulatory Visit (INDEPENDENT_AMBULATORY_CARE_PROVIDER_SITE_OTHER): Payer: Medicare HMO | Admitting: Podiatry

## 2023-02-23 ENCOUNTER — Encounter: Payer: Self-pay | Admitting: Podiatry

## 2023-02-23 DIAGNOSIS — M79674 Pain in right toe(s): Secondary | ICD-10-CM | POA: Diagnosis not present

## 2023-02-23 DIAGNOSIS — E1169 Type 2 diabetes mellitus with other specified complication: Secondary | ICD-10-CM

## 2023-02-23 DIAGNOSIS — M79675 Pain in left toe(s): Secondary | ICD-10-CM

## 2023-02-23 DIAGNOSIS — E785 Hyperlipidemia, unspecified: Secondary | ICD-10-CM

## 2023-02-23 DIAGNOSIS — B351 Tinea unguium: Secondary | ICD-10-CM | POA: Diagnosis not present

## 2023-02-26 ENCOUNTER — Telehealth: Payer: Self-pay | Admitting: Internal Medicine

## 2023-02-26 NOTE — Telephone Encounter (Signed)
Patient called in needs pre auth on  Continuous Glucose Sensor (FREESTYLE LIBRE 3 SENSOR) MISC

## 2023-03-01 ENCOUNTER — Other Ambulatory Visit: Payer: Self-pay

## 2023-03-01 DIAGNOSIS — G4709 Other insomnia: Secondary | ICD-10-CM

## 2023-03-01 DIAGNOSIS — E1169 Type 2 diabetes mellitus with other specified complication: Secondary | ICD-10-CM

## 2023-03-01 MED ORDER — FREESTYLE LIBRE 3 SENSOR MISC
1.0000 | 12 refills | Status: DC
Start: 2023-03-01 — End: 2023-05-20

## 2023-03-01 MED ORDER — ESZOPICLONE 2 MG PO TABS
2.0000 mg | ORAL_TABLET | Freq: Every evening | ORAL | 0 refills | Status: DC | PRN
Start: 2023-03-01 — End: 2023-04-01

## 2023-03-01 NOTE — Telephone Encounter (Signed)
PA denied. Patient will call ins and see what will be covered.

## 2023-03-04 ENCOUNTER — Other Ambulatory Visit: Payer: Self-pay

## 2023-03-04 ENCOUNTER — Other Ambulatory Visit: Payer: Self-pay | Admitting: Cardiology

## 2023-03-04 DIAGNOSIS — Z79899 Other long term (current) drug therapy: Secondary | ICD-10-CM

## 2023-03-05 NOTE — Progress Notes (Signed)
  Subjective:  [name]., male    DOB: 08-01-1940,  MRN: [id number]  Douglas Keller. presents to clinic today for preventative diabetic foot care and painful elongated mycotic toenails 1-5 bilaterally which are tender when wearing enclosed shoe gear. Pain is relieved with periodic professional debridement. Patient has had right knee surgery and is recovering well. Chief Complaint  Patient presents with   Nail Problem    DFC   New problem(s): None.   PCP is Billie Lade, MD. LOV was 02/04/2023.  Allergies  Allergen Reactions   Metformin And Related Diarrhea and Nausea And Vomiting   Statins Other (See Comments)    Muscle/joint pain     Review of Systems: Negative except as noted in the HPI.  Objective: No changes noted in today's physical examination. There were no vitals filed for this visit. Douglas Keller. is a pleasant 82 y.o. male WD, WN in NAD. AAO x 3.  Vascular Examination: Capillary refill time immediate b/l. Vascular status intact b/l with palpable pedal pulses. Pedal hair present b/l. No pain with calf compression b/l. Skin temperature gradient WNL b/l. No cyanosis or clubbing b/l. No ischemia or gangrene noted b/l.   Neurological Examination: Sensation grossly intact b/l with 10 gram monofilament. Vibratory sensation intact b/l.   Dermatological Examination: Pedal skin with normal turgor, texture and tone b/l.  No open wounds. No interdigital macerations.   Toenails 1-5 b/l thick, discolored, elongated with subungual debris and pain on dorsal palpation.   No corns, calluses nor porokeratotic lesions noted.  Musculoskeletal Examination: Muscle strength 5/5 to all lower extremity muscle groups bilaterally. Bunion deformity b/l. Patient ambulates independent of any assistive aids.  Radiographs: None  Last A1c:      Latest Ref Rng & Units 02/04/2023    9:49 AM 07/24/2022    3:52 PM  Hemoglobin A1C  Hemoglobin-A1c 4.8 - 5.6 % 6.3   6.2    Assessment/Plan: 1. Pain due to onychomycosis of toenails of both feet   2. Type 2 diabetes mellitus with hyperlipidemia [facility])    Patient was evaluated and treated. All patient's and/or POA's questions/concerns addressed on today's visit. Mycotic toenails 1-5 debrided in length and girth without incident. Continue soft, supportive shoe gear daily. Report any pedal injuries to medical professional. Call office if there are any quesitons/concerns. -Continue foot and shoe inspections daily. Monitor blood glucose per PCP/Endocrinologist's recommendations. -Patient/POA to call should there be question/concern in the interim.   Return in about 3 months (around 05/26/2023).  Freddie Breech, DPM

## 2023-04-01 ENCOUNTER — Encounter (HOSPITAL_BASED_OUTPATIENT_CLINIC_OR_DEPARTMENT_OTHER): Payer: Self-pay | Admitting: Internal Medicine

## 2023-04-01 ENCOUNTER — Ambulatory Visit (HOSPITAL_BASED_OUTPATIENT_CLINIC_OR_DEPARTMENT_OTHER): Payer: Medicare HMO | Admitting: Pulmonary Disease

## 2023-04-01 ENCOUNTER — Encounter (HOSPITAL_BASED_OUTPATIENT_CLINIC_OR_DEPARTMENT_OTHER): Payer: Self-pay | Admitting: Pulmonary Disease

## 2023-04-01 VITALS — BP 136/78 | HR 59 | Resp 20 | Ht 66.0 in | Wt 209.9 lb

## 2023-04-01 DIAGNOSIS — G4733 Obstructive sleep apnea (adult) (pediatric): Secondary | ICD-10-CM

## 2023-04-01 DIAGNOSIS — G4709 Other insomnia: Secondary | ICD-10-CM | POA: Diagnosis not present

## 2023-04-01 MED ORDER — TRAZODONE HCL 100 MG PO TABS: 100.0000 mg | ORAL_TABLET | Freq: Every day | ORAL | 1 refills | Status: DC

## 2023-04-01 NOTE — Assessment & Plan Note (Signed)
Will obtain a sleep study from [facility] and decide if he does need therapy.  He has not tolerated CPAP, not sure is a candidate for dental appliance.  If he has severe OSA then can consider hypoglossal nerve stimulation device

## 2023-04-01 NOTE — Assessment & Plan Note (Signed)
Appears to be associated with some level of depression Rules of sleep hygiene were discussed  - light exercise -avoid caffeinated beverages - no more than 20 mins staying awake in bed, if not asleep, get out of bed & reading or light music - No TV or computer games at bedtime.  Douglas Keller does not seem to be working.  Will trial trazodone at 100 mg and increase to 150 mg if needed.  Will try to avoid benzodiazepines and analogues in this age group

## 2023-04-01 NOTE — Progress Notes (Signed)
Subjective:    [name]., male    DOB: August 29, 1940, 82 y.o.   MRN: [id number]  HPI  82 year old man who presents for evaluation of OSA and insomnia. He reports onset of insomnia about 15 years ago when his wife died.  He admits to sadness of mood and wonders if he has depression.  Symptoms get worse especially in the dark and around holidays.  He tried melatonin in the past and more recently was placed on Lunesta.  This was increased from 1 mg to 2 mg but he still reports delayed sleep latency of 2 to 3 hours. Epworth sleepiness score is 4. Bedtime around 11 PM, TV is on a timer, he will get out of bed after 1 hour and go to a recliner and sometimes doze off there, reports 2-3 nocturnal awakenings after he finally falls asleep until he is out of bed at 8 AM feeling tired with dryness of mouth.  There is no history suggestive of cataplexy, sleep paralysis or parasomnias  He was diagnosed with OSA at [facility] sleep medicine and placed on CPAP therapy 05/2022.  He tried both nasal and fullface mask but did not tolerate and did not feel rested he has returned his CPAP machine. States that he used it for approximately 2 weeks and did not find it beneficial.   PMH A-fib, hypertension and diabetes-2  Dizziness/syncope-eval.  By ENT, cardiology, ER visits, feels may be related to hypoglycemia   Past Medical History:  Diagnosis Date   Aortic valve sclerosis    Atherosclerosis of coronary artery without angina pectoris 07/05/2014   Cervical spondylosis without myelopathy 02/13/2013   Chest pain 01/29/2019   Degeneration of cervical intervertebral disc 02/13/2013   Depression    sometimes   Diabetes mellitus type 2, diet-controlled (HCC)    Elevated troponin    GERD (gastroesophageal reflux disease)    Heart murmur    HTN (hypertension) 07/06/2013   Hypercholesterolemia 08/21/2015   Hyperlipidemia 07/06/2013   Medication management 08/21/2015   Pre-diabetes    Rectal bleeding  09/21/2019   Stiffness of left shoulder joint 07/19/2013    .psh  Allergies  Allergen Reactions   Metformin And Related Diarrhea and Nausea And Vomiting   Statins Other (See Comments)    Muscle/joint pain     Social History   Socioeconomic History   Marital status: Widowed    Spouse name: Cherly    Number of children: 1   Years of education: Not on file   Highest education level: 12th grade  Occupational History    Comment: Retired   Tobacco Use   Smoking status: Never    Passive exposure: Never   Smokeless tobacco: Never  Vaping Use   Vaping status: Never Used  Substance and Sexual Activity   Alcohol use: No   Drug use: No   Sexual activity: Not Currently  Other Topics Concern   Not on file  Social History Narrative   Live alone right now, wife passed in 2009    Sabrina Biggs daughter-grandchild 2 live in [place]      Enjoys playing golf      Diet: eats what he likes, sweet, fast food, does not eat a lot of canned or boxed foods.    Does not eat enough fruits and veggies.       Caffeine: 1 cup daily   Water: 3-4 bottles of water daily, Gatorade every now and then  Wears seat belt   Wears sun protection when golfing   Smoke detectors at home   Does not use phone while driving   Social Determinants of Health   Financial Resource Strain: Low Risk  (03/03/2021)   Overall Financial Resource Strain (CARDIA)    Difficulty of Paying Living Expenses: Not hard at all  Food Insecurity: No Food Insecurity (03/09/2022)   Hunger Vital Sign    Worried About Running Out of Food in the Last Year: Never true    Ran Out of Food in the Last Year: Never true  Transportation Needs: No Transportation Needs (03/09/2022)   PRAPARE - Administrator, Civil Service (Medical): No    Lack of Transportation (Non-Medical): No  Physical Activity: Inactive (03/09/2022)   Exercise Vital Sign    Days of Exercise per Week: 0 days    Minutes of Exercise per Session: 0 min   Stress: No Stress Concern Present (03/03/2021)   [employer] of Occupational Health - Occupational Stress Questionnaire    Feeling of Stress : Not at all  Social Connections: Moderately Isolated (03/09/2022)   Social Connection and Isolation Panel [NHANES]    Frequency of Communication with Friends and Family: More than three times a week    Frequency of Social Gatherings with Friends and Family: More than three times a week    Attends Religious Services: More than 4 times per year    Active Member of [employer] or Organizations: No    Attends Banker Meetings: Never    Marital Status: Widowed  Intimate Partner Violence: Not At Risk (03/03/2021)   Humiliation, Afraid, Rape, and Kick questionnaire    Fear of Current or Ex-Partner: No    Emotionally Abused: No    Physically Abused: No    Sexually Abused: No    Family History  Problem Relation Age of Onset   Asthma Mother    Hypertension Father    Colon cancer Neg Hx       Review of Systems Sadness of mood Dyspnea on exertion  Constitutional: negative for anorexia, fevers and sweats  Eyes: negative for irritation, redness and visual disturbance  Ears, nose, mouth, throat, and face: negative for earaches, epistaxis, nasal congestion and sore throat  Respiratory: negative for cough,  sputum and wheezing  Cardiovascular: negative for chest pain, lower extremity edema, orthopnea, palpitations and syncope  Gastrointestinal: negative for abdominal pain, constipation, diarrhea, melena, nausea and vomiting  Genitourinary:negative for dysuria, frequency and hematuria  Hematologic/lymphatic: negative for bleeding, easy bruising and lymphadenopathy  Musculoskeletal:negative for arthralgias, muscle weakness and stiff joints  Neurological: negative for coordination problems, gait problems, headaches and weakness  Endocrine: negative for diabetic symptoms including polydipsia, polyuria and weight loss     Objective:    Physical Exam  Gen. Pleasant, obese, elderly, in no distress, normal affect ENT - no pallor,icterus, no post nasal drip, class 2-3 airway Neck: No JVD, no thyromegaly, no carotid bruits Lungs: no use of accessory muscles, no dullness to percussion, decreased without rales or rhonchi  Cardiovascular: Rhythm regular, heart sounds  normal, no murmurs or gallops, no peripheral edema Abdomen: soft and non-tender, no hepatosplenomegaly, BS normal. Musculoskeletal: No deformities, no cyanosis or clubbing Neuro:  alert, non focal, no tremors       Assessment & Plan:

## 2023-04-01 NOTE — Patient Instructions (Signed)
x obtain copy of sleep study from [facility]  Rx for trazodone 100 mg at bedtime, try this for 3 nights if does not work okay to take an extra half tablet -150 mg

## 2023-04-05 DIAGNOSIS — M1712 Unilateral primary osteoarthritis, left knee: Secondary | ICD-10-CM | POA: Diagnosis not present

## 2023-04-05 DIAGNOSIS — M170 Bilateral primary osteoarthritis of knee: Secondary | ICD-10-CM | POA: Diagnosis not present

## 2023-04-05 DIAGNOSIS — M1711 Unilateral primary osteoarthritis, right knee: Secondary | ICD-10-CM | POA: Diagnosis not present

## 2023-04-14 ENCOUNTER — Ambulatory Visit: Payer: Medicare HMO

## 2023-04-14 VITALS — Ht 66.0 in | Wt 210.0 lb

## 2023-04-14 DIAGNOSIS — Z0000 Encounter for general adult medical examination without abnormal findings: Secondary | ICD-10-CM

## 2023-04-14 NOTE — Patient Instructions (Signed)
Mr. Goldbeck , Thank you for taking time to come for your Medicare Wellness Visit. I appreciate your ongoing commitment to your health goals. Please review the following plan we discussed and let me know if I can assist you in the future.   Referrals/Orders/Follow-Ups/Clinician Recommendations: Aim for 30 minutes of exercise or brisk walking, 6-8 glasses of water, and 5 servings of fruits and vegetables each day.   This is a list of the screening recommended for you and due dates:  Health Maintenance  Topic Date Due   Eye exam for diabetics  09/16/2022   Flu Shot  02/18/2023   COVID-19 Vaccine (6 - 2023-24 season) 03/21/2023   Hemoglobin A1C  08/07/2023   Complete foot exam   10/12/2023   Yearly kidney function blood test for diabetes  02/04/2024   Yearly kidney health urinalysis for diabetes  02/04/2024   Medicare Annual Wellness Visit  04/13/2024   DTaP/Tdap/Td vaccine (3 - Tdap) 12/27/2031   Pneumonia Vaccine  Completed   Zoster (Shingles) Vaccine  Completed   HPV Vaccine  Aged Out   Hepatitis C Screening  Discontinued    Advanced directives: (Copy Requested) Please bring a copy of your health care power of attorney and living will to the office to be added to your chart at your convenience.  Next Medicare Annual Wellness Visit scheduled for next year: Yes  insert Preventive Care attachment Insert FALL PREVENTION attachment if needed

## 2023-04-14 NOTE — Progress Notes (Signed)
Subjective:   Douglas Keller. is a 82 y.o. male who presents for Medicare Annual/Subsequent preventive examination.  Visit Complete: Virtual  I connected with  Douglas Keller. on 04/14/23 by a audio enabled telemedicine application and verified that I am speaking with the correct person using two identifiers.  Patient Location: Home  Provider Location: Home Office  I discussed the limitations of evaluation and management by telemedicine. The patient expressed understanding and agreed to proceed.  Patient Medicare AWV questionnaire was completed by the patient on 04/14/2023; I have confirmed that all information answered by patient is correct and no changes since this date.  Cardiac Risk Factors include: advanced age (>49men, >75 women);diabetes mellitus;dyslipidemia;male gender;hypertensionBecause this visit was a virtual/telehealth visit, some criteria may be missing or patient reported. Any vitals not documented were not able to be obtained and vitals that have been documented are patient reported.       Objective:    Today's Vitals   04/14/23 1117  Weight: 210 lb (95.3 kg)  Height: 5\' 6"  (1.676 m)   Body mass index is 33.89 kg/m.     04/14/2023   11:19 AM 03/10/2022   10:41 PM 04/18/2021    8:43 AM 03/05/2021    3:54 AM 03/03/2021    8:46 AM 01/20/2021    1:26 AM 12/13/2020    2:12 AM  Advanced Directives  Does Patient Have a Medical Advance Directive? Yes No Yes No No No No  Type of Estate agent of [place];Living will  Healthcare Power of Attorney      Does patient want to make changes to medical advance directive?   No - Patient declined      Copy of Healthcare Power of Attorney in Chart? No - copy requested  No - copy requested      Would patient like information on creating a medical advance directive?    No - Patient declined No - Patient declined No - Patient declined     Current Medications (verified) Outpatient Encounter Medications as  of 04/14/2023  Medication Sig   amiodarone (PACERONE) 200 MG tablet TAKE 1 TABLET EVERY DAY   amLODipine (NORVASC) 10 MG tablet Take 1 tablet (10 mg total) by mouth daily.   aspirin EC 81 MG tablet Take 81 mg by mouth daily.   benazepril (LOTENSIN) 10 MG tablet TAKE 1 TABLET EVERY DAY   blood glucose meter kit and supplies Dispense based on patient and insurance preference. Use up to four times daily as directed. (FOR ICD-10 E10.9, E11.9).   Continuous Glucose Sensor (FREESTYLE LIBRE 3 SENSOR) MISC 1 each by Does not apply route continuous. Place 1 sensor on the skin every 14 days. Use to check glucose continuously   Evolocumab (REPATHA SURECLICK) 140 MG/ML SOAJ INJECT 140 MG INTO THE SKIN EVERY 14 DAYS   glipiZIDE (GLUCOTROL XL) 2.5 MG 24 hr tablet TAKE 1 TABLET EVERY DAY WITH BREAKFAST   magnesium oxide (MAG-OX) 400 MG tablet 1 tablet as needed   meclizine (ANTIVERT) 25 MG tablet Take 1 tablet (25 mg total) by mouth 3 (three) times daily as needed for dizziness.   multivitamin-iron-minerals-folic acid (CENTRUM) chewable tablet Chew 1 tablet by mouth daily.   spironolactone-hydrochlorothiazide (ALDACTAZIDE) 25-25 MG tablet TAKE 1 TABLET EVERY OTHER DAY   traZODone (DESYREL) 100 MG tablet Take 1 tablet (100 mg total) by mouth at bedtime.   No facility-administered encounter medications on file as of 04/14/2023.    Allergies (verified) Metformin and related  and Statins   History: Past Medical History:  Diagnosis Date   Aortic valve sclerosis    Atherosclerosis of coronary artery without angina pectoris 07/05/2014   Cervical spondylosis without myelopathy 02/13/2013   Chest pain 01/29/2019   Degeneration of cervical intervertebral disc 02/13/2013   Depression    sometimes   Diabetes mellitus type 2, diet-controlled (HCC)    Elevated troponin    GERD (gastroesophageal reflux disease)    Heart murmur    HTN (hypertension) 07/06/2013   Hypercholesterolemia 08/21/2015   Hyperlipidemia  07/06/2013   Medication management 08/21/2015   Pre-diabetes    Rectal bleeding 09/21/2019   Stiffness of left shoulder joint 07/19/2013   Past Surgical History:  Procedure Laterality Date   BIOPSY  12/14/2019   Procedure: BIOPSY;  Surgeon: Corbin Ade, MD;  [facility];  Service: Endoscopy;;   CERVICAL SPINE SURGERY     COLONOSCOPY  07/26/2012   Procedure: COLONOSCOPY;  Surgeon: Dalia Heading, MD;  [facility];  Service: Gastroenterology;  Laterality: N/A;   COLONOSCOPY WITH PROPOFOL N/A 12/14/2019   Procedure: COLONOSCOPY WITH PROPOFOL;  Surgeon: Corbin Ade, MD;  [facility];  Service: Endoscopy;  Laterality: N/A;  9:00am   KNEE ARTHROSCOPY Left    torn ligament   LUMBAR LAMINECTOMY/DECOMPRESSION MICRODISCECTOMY Bilateral 12/27/2019   Procedure: BIATERAL LUMBAR TWO- LUMBAR THREE, LUMBAR THREE- LUMBAR FOUR, LUMBAR FOUR-FIVE LAMINOTOMY/FORAMINOTOMY 3 LEVELS;  Surgeon: Tressie Stalker, MD;  Location: [facility];  Service: Neurosurgery;  Laterality: Bilateral;  POSTERIOR   PVC ABLATION N/A 04/18/2021   Procedure: PVC ABLATION;  Surgeon: Lanier Prude, MD;  [facility];  Service: Cardiovascular;  Laterality: N/A;   REPLACEMENT TOTAL KNEE Right 09/02/2022   SPINE SURGERY N/A    Phreesia 02/24/2020   Family History  Problem Relation Age of Onset   Asthma Mother    Hypertension Father    Colon cancer Neg Hx    Social History   Socioeconomic History   Marital status: Widowed    Spouse name: Cherly    Number of children: 1   Years of education: Not on file   Highest education level: 12th grade  Occupational History    Comment: Retired   Tobacco Use   Smoking status: Never    Passive exposure: Never   Smokeless tobacco: Never  Vaping Use   Vaping status: Never Used  Substance and Sexual Activity   Alcohol use: No   Drug use: No   Sexual activity: Not Currently  Other Topics Concern   Not on file  Social History  Narrative   Live alone right now, wife passed in 2009    Sabrina Biggs daughter-grandchild 2 live in [place]      Enjoys playing golf      Diet: eats what he likes, sweet, fast food, does not eat a lot of canned or boxed foods.    Does not eat enough fruits and veggies.       Caffeine: 1 cup daily   Water: 3-4 bottles of water daily, Gatorade every now and then         Wears seat belt   Wears sun protection when golfing   Smoke detectors at home   Does not use phone while driving   Social Determinants of Health   Financial Resource Strain: Low Risk  (04/14/2023)   Overall Financial Resource Strain (CARDIA)    Difficulty of Paying Living Expenses: Not hard at all  Food Insecurity: No Food Insecurity (04/14/2023)   Hunger Vital Sign    Worried About Running Out of Food in the Last Year: Never true    Ran Out of Food in the Last Year: Never true  Transportation Needs: No Transportation Needs (04/14/2023)   PRAPARE - Administrator, Civil Service (Medical): No    Lack of Transportation (Non-Medical): No  Physical Activity: Insufficiently Active (04/14/2023)   Exercise Vital Sign    Days of Exercise per Week: 3 days    Minutes of Exercise per Session: 30 min  Stress: No Stress Concern Present (04/14/2023)   [employer] of Occupational Health - Occupational Stress Questionnaire    Feeling of Stress : Not at all  Social Connections: Moderately Isolated (04/14/2023)   Social Connection and Isolation Panel [NHANES]    Frequency of Communication with Friends and Family: More than three times a week    Frequency of Social Gatherings with Friends and Family: More than three times a week    Attends Religious Services: More than 4 times per year    Active Member of [employer] or Organizations: No    Attends Banker Meetings: Never    Marital Status: Widowed    Tobacco Counseling Counseling given: Not Answered   Clinical Intake:  Pre-visit preparation completed:  Yes  Pain : No/denies pain     Nutritional Risks: None Diabetes: Yes CBG done?: No Did pt. bring in CBG monitor from home?: No  How often do you need to have someone help you when you read instructions, pamphlets, or other written materials from your doctor or pharmacy?: 1 - Never  Interpreter Needed?: No  Information entered by :: Renie Ora, LPN   Activities of Daily Living    04/14/2023   11:20 AM  In your present state of health, do you have any difficulty performing the following activities:  Hearing? 0  Vision? 0  Difficulty concentrating or making decisions? 0  Walking or climbing stairs? 0  Dressing or bathing? 0  Doing errands, shopping? 0  Preparing Food and eating ? N  Using the Toilet? N  In the past six months, have you accidently leaked urine? N  Do you have problems with loss of bowel control? N  Managing your Medications? N  Managing your Finances? N  Housekeeping or managing your Housekeeping? N    Patient Care Team: Billie Lade, MD as PCP - General (Internal Medicine) Thurmon Fair, MD as PCP - Cardiology (Cardiology) Lanier Prude, MD as PCP - Electrophysiology (Cardiology) Jena Gauss Gerrit Friends, MD as Consulting Physician (Gastroenterology) Carilyn Goodpasture, PA-C as Physician Assistant (Family Medicine)  Indicate any recent Medical Services you may have received from other than [facility] providers in the past year (date may be approximate).     Assessment:   This is a routine wellness examination for [place].  Hearing/Vision screen Vision Screening - Comments:: Wears rx glasses - up to date with routine eye exams with  Dr.Cotter    Goals Addressed             This Visit's Progress    DIET - INCREASE WATER INTAKE   On track      Depression Screen    04/14/2023   11:19 AM 02/04/2023    9:16 AM 12/18/2022    3:54 PM 11/05/2022    9:10 AM 10/16/2022    8:59 AM 07/24/2022    2:34 PM 04/28/2022   10:12 AM  PHQ 2/9 Scores  PHQ - 2  Score 0 2 1 5 5  0 5  PHQ- 9 Score  8 7 12 12 6 8     Fall Risk    04/14/2023   11:17 AM 02/04/2023    9:16 AM 12/18/2022    3:54 PM 11/05/2022    9:10 AM 10/16/2022    8:59 AM  Fall Risk   Falls in the past year? 0 0 0 0 0  Number falls in past yr: 0 0 0 0 0  Injury with Fall? 0 0 0 0 0  Risk for fall due to : No Fall Risks No Fall Risks No Fall Risks No Fall Risks No Fall Risks  Follow up Falls prevention discussed Falls evaluation completed Falls evaluation completed Falls evaluation completed Falls evaluation completed    MEDICARE RISK AT HOME: Medicare Risk at Home Any stairs in or around the home?: Yes If so, are there any without handrails?: No Home free of loose throw rugs in walkways, pet beds, electrical cords, etc?: Yes Adequate lighting in your home to reduce risk of falls?: Yes Life alert?: No Use of a cane, walker or w/c?: No Grab bars in the bathroom?: Yes Shower chair or bench in shower?: Yes Elevated toilet seat or a handicapped toilet?: Yes  TIMED UP AND GO:  Was the test performed?  No    Cognitive Function:    03/03/2021    8:47 AM  MMSE - Mini Mental State Exam  Not completed: Unable to complete        04/14/2023   11:20 AM 03/09/2022    9:49 AM 03/03/2021    8:47 AM 02/28/2020    4:32 PM 02/28/2020    4:31 PM  6CIT Screen  What Year? 0 points 0 points 0 points 0 points 0 points  What month? 0 points 0 points 0 points 0 points 0 points  What time? 0 points 0 points 0 points 0 points   Count back from 20 0 points 0 points 0 points 0 points   Months in reverse 0 points 0 points 0 points 0 points   Repeat phrase 0 points 4 points 0 points 0 points   Total Score 0 points 4 points 0 points 0 points     Immunizations Immunization History  Administered Date(s) Administered   Fluad Quad(high Dose 65+) 03/20/2019, 05/03/2020, 04/28/2022   Influenza-Unspecified 04/19/2014, 05/20/2021   Moderna Covid-19 Vaccine Bivalent Booster 28yrs & up 10/31/2020    Moderna Sars-Covid-2 Vaccination 08/11/2019, 09/11/2019, 05/15/2020, 12/26/2021   PNEUMOCOCCAL CONJUGATE-20 12/26/2021   Pneumococcal Polysaccharide-23 11/29/2019   Pneumococcal-Unspecified 12/26/2021   Td 04/21/2011, 12/26/2021   Zoster Recombinant(Shingrix) 04/15/2020, 08/19/2020    TDAP status: Up to date  Flu Vaccine status: Up to date  Pneumococcal vaccine status: Up to date  Covid-19 vaccine status: Completed vaccines  Qualifies for Shingles Vaccine? Yes   Zostavax completed Yes   Shingrix Completed?: Yes  Screening Tests Health Maintenance  Topic Date Due   OPHTHALMOLOGY EXAM  09/16/2022   INFLUENZA VACCINE  02/18/2023   COVID-19 Vaccine (6 - 2023-24 season) 03/21/2023   HEMOGLOBIN A1C  08/07/2023   FOOT EXAM  10/12/2023   Diabetic kidney evaluation - eGFR measurement  02/04/2024   Diabetic kidney evaluation - Urine ACR  02/04/2024   Medicare Annual Wellness (AWV)  04/13/2024   DTaP/Tdap/Td (3 - Tdap) 12/27/2031   Pneumonia Vaccine 24+ Years old  Completed   Zoster Vaccines- Shingrix  Completed   HPV VACCINES  Aged Out  Hepatitis C Screening  Discontinued    Health Maintenance  Health Maintenance Due  Topic Date Due   OPHTHALMOLOGY EXAM  09/16/2022   INFLUENZA VACCINE  02/18/2023   COVID-19 Vaccine (6 - 2023-24 season) 03/21/2023    Colorectal cancer screening: No longer required.   Lung Cancer Screening: (Low Dose CT Chest recommended if Age 73-80 years, 20 pack-year currently smoking OR have quit w/in 15years.) does not qualify.   Lung Cancer Screening Referral: n/a   Additional Screening:  Hepatitis C Screening: does not qualify; Completed 08/28/2021  Vision Screening: Recommended annual ophthalmology exams for early detection of glaucoma and other disorders of the eye. Is the patient up to date with their annual eye exam?  Yes  Who is the provider or what is the name of the office in which the patient attends annual eye exams? Dr.Cotter  If pt  is not established with a provider, would they like to be referred to a provider to establish care? No .   Dental Screening: Recommended annual dental exams for proper oral hygiene   Community Resource Referral / Chronic Care Management: CRR required this visit?  No   CCM required this visit?  No     Plan:     I have personally reviewed and noted the following in the patient's chart:   Medical and social history Use of alcohol, tobacco or illicit drugs  Current medications and supplements including opioid prescriptions. Patient is not currently taking opioid prescriptions. Functional ability and status Nutritional status Physical activity Advanced directives List of other physicians Hospitalizations, surgeries, and ER visits in previous 12 months Vitals Screenings to include cognitive, depression, and falls Referrals and appointments  In addition, I have reviewed and discussed with patient certain preventive protocols, quality metrics, and best practice recommendations. A written personalized care plan for preventive services as well as general preventive health recommendations were provided to patient.     Lorrene Reid, LPN   03/17/5620   After Visit Summary: (MyChart) Due to this being a telephonic visit, the after visit summary with patients personalized plan was offered to patient via MyChart   Nurse Notes: none

## 2023-04-18 ENCOUNTER — Other Ambulatory Visit: Payer: Self-pay | Admitting: Physician Assistant

## 2023-05-02 ENCOUNTER — Other Ambulatory Visit: Payer: Self-pay | Admitting: Cardiology

## 2023-05-02 ENCOUNTER — Other Ambulatory Visit: Payer: Self-pay | Admitting: Internal Medicine

## 2023-05-06 ENCOUNTER — Ambulatory Visit: Payer: Medicare HMO | Admitting: Internal Medicine

## 2023-05-06 ENCOUNTER — Encounter: Payer: Self-pay | Admitting: Internal Medicine

## 2023-05-06 VITALS — BP 138/78 | HR 68 | Ht 66.0 in | Wt 208.8 lb

## 2023-05-06 DIAGNOSIS — E1169 Type 2 diabetes mellitus with other specified complication: Secondary | ICD-10-CM | POA: Diagnosis not present

## 2023-05-06 DIAGNOSIS — E785 Hyperlipidemia, unspecified: Secondary | ICD-10-CM | POA: Diagnosis not present

## 2023-05-06 DIAGNOSIS — E559 Vitamin D deficiency, unspecified: Secondary | ICD-10-CM | POA: Diagnosis not present

## 2023-05-06 DIAGNOSIS — G4709 Other insomnia: Secondary | ICD-10-CM

## 2023-05-06 DIAGNOSIS — Z7984 Long term (current) use of oral hypoglycemic drugs: Secondary | ICD-10-CM

## 2023-05-06 DIAGNOSIS — E7800 Pure hypercholesterolemia, unspecified: Secondary | ICD-10-CM | POA: Diagnosis not present

## 2023-05-06 DIAGNOSIS — I10 Essential (primary) hypertension: Secondary | ICD-10-CM | POA: Diagnosis not present

## 2023-05-06 NOTE — Progress Notes (Signed)
Established Patient Office Visit  Subjective   Patient ID: Douglas Model., male    DOB: [birth year]  Age: 82 y.o. MRN: [id number]  Chief Complaint  Patient presents with   Insomnia    Three month follow up    Douglas Keller returns to care today for routine follow-up.  He was last evaluated by me on 7/18.  Sleep medicine referral placed at that time.  Lunesta was increased to 2 mg nightly for improved treatment of insomnia.  No additional medication changes were made and 53-month follow-up was arranged.  In the interim he has been evaluated by sleep medicine (9/12).  There have otherwise been no acute interval events.  Douglas Keller reports feeling well today.  He endorses chronic fatigue but is otherwise asymptomatic and has no acute concerns to discuss.  Past Medical History:  Diagnosis Date   Aortic valve sclerosis    Atherosclerosis of coronary artery without angina pectoris 07/05/2014   Cervical spondylosis without myelopathy 02/13/2013   Chest pain 01/29/2019   Degeneration of cervical intervertebral disc 02/13/2013   Depression    sometimes   Diabetes mellitus type 2, diet-controlled (HCC)    Elevated troponin    GERD (gastroesophageal reflux disease)    Heart murmur    HTN (hypertension) 07/06/2013   Hypercholesterolemia 08/21/2015   Hyperlipidemia 07/06/2013   Medication management 08/21/2015   Pre-diabetes    Rectal bleeding 09/21/2019   Stiffness of left shoulder joint 07/19/2013   Past Surgical History:  Procedure Laterality Date   BIOPSY  12/14/2019   Procedure: BIOPSY;  Surgeon: Corbin Ade, MD;  [facility];  Service: Endoscopy;;   CERVICAL SPINE SURGERY     COLONOSCOPY  07/26/2012   Procedure: COLONOSCOPY;  Surgeon: Dalia Heading, MD;  [facility];  Service: Gastroenterology;  Laterality: N/A;   COLONOSCOPY WITH PROPOFOL N/A 12/14/2019   Procedure: COLONOSCOPY WITH PROPOFOL;  Surgeon: Corbin Ade, MD;  [facility];  Service:  Endoscopy;  Laterality: N/A;  9:00am   KNEE ARTHROSCOPY Left    torn ligament   LUMBAR LAMINECTOMY/DECOMPRESSION MICRODISCECTOMY Bilateral 12/27/2019   Procedure: BIATERAL LUMBAR TWO- LUMBAR THREE, LUMBAR THREE- LUMBAR FOUR, LUMBAR FOUR-FIVE LAMINOTOMY/FORAMINOTOMY 3 LEVELS;  Surgeon: Tressie Stalker, MD;  [facility];  Service: Neurosurgery;  Laterality: Bilateral;  POSTERIOR   PVC ABLATION N/A 04/18/2021   Procedure: PVC ABLATION;  Surgeon: Lanier Prude, MD;  [facility];  Service: Cardiovascular;  Laterality: N/A;   REPLACEMENT TOTAL KNEE Right 09/02/2022   SPINE SURGERY N/A    Phreesia 02/24/2020   Social History   Tobacco Use   Smoking status: Never    Passive exposure: Never   Smokeless tobacco: Never  Vaping Use   Vaping status: Never Used  Substance Use Topics   Alcohol use: No   Drug use: No   Family History  Problem Relation Age of Onset   Asthma Mother    Hypertension Father    Colon cancer Neg Hx    Allergies  Allergen Reactions   Metformin And Related Diarrhea and Nausea And Vomiting   Statins Other (See Comments)    Muscle/joint pain    Review of Systems  Constitutional:  Positive for malaise/fatigue (Chronic, unchanged).     Objective:     BP 138/78 (BP Location: Right Arm, Patient Position: Sitting, Cuff Size: Normal)   Pulse 68   Ht 5\' 6"  (1.676 m)   Wt 208 lb 12.8  oz (94.7 kg)   SpO2 94%   BMI 33.70 kg/m  BP Readings from Last 3 Encounters:  05/06/23 138/78  04/01/23 136/78  02/04/23 126/76   Physical Exam Vitals reviewed.  Constitutional:      General: He is not in acute distress.    Appearance: Normal appearance. He is obese. He is not ill-appearing.  HENT:     Head: Normocephalic and atraumatic.     Right Ear: External ear normal.     Left Ear: External ear normal.     Nose: Nose normal. No congestion or rhinorrhea.     Mouth/Throat:     Mouth: Mucous membranes are moist.     Pharynx: Oropharynx is  clear.  Eyes:     General: No scleral icterus.    Extraocular Movements: Extraocular movements intact.     Conjunctiva/sclera: Conjunctivae normal.     Pupils: Pupils are equal, round, and reactive to light.  Cardiovascular:     Rate and Rhythm: Normal rate and regular rhythm.     Pulses: Normal pulses.     Heart sounds: Murmur heard.  Pulmonary:     Effort: Pulmonary effort is normal.     Breath sounds: Normal breath sounds. No wheezing, rhonchi or rales.  Abdominal:     General: Abdomen is flat. Bowel sounds are normal. There is no distension.     Palpations: Abdomen is soft.     Tenderness: There is no abdominal tenderness.  Musculoskeletal:        General: No swelling or deformity. Normal range of motion.     Cervical back: Normal range of motion.  Skin:    General: Skin is warm and dry.     Capillary Refill: Capillary refill takes less than 2 seconds.  Neurological:     General: No focal deficit present.     Mental Status: He is alert and oriented to person, place, and time.     Motor: No weakness.  Psychiatric:        Mood and Affect: Mood normal.        Behavior: Behavior normal.        Thought Content: Thought content normal.   Last CBC Lab Results  Component Value Date   WBC 8.3 07/24/2022   HGB 14.7 07/24/2022   HCT 43.7 07/24/2022   MCV 92 07/24/2022   MCH 30.9 07/24/2022   RDW 13.4 07/24/2022   PLT 309 07/24/2022   Last metabolic panel Lab Results  Component Value Date   GLUCOSE 93 02/04/2023   NA 143 02/04/2023   K 4.5 02/04/2023   CL 106 02/04/2023   CO2 22 02/04/2023   BUN 16 02/04/2023   CREATININE 1.24 02/04/2023   EGFR 58 (L) 02/04/2023   CALCIUM 9.3 02/04/2023   PROT 7.2 02/04/2023   ALBUMIN 4.6 02/04/2023   LABGLOB 2.6 02/04/2023   AGRATIO 1.5 07/24/2022   BILITOT 1.0 02/04/2023   ALKPHOS 78 02/04/2023   AST 26 02/04/2023   ALT 21 02/04/2023   ANIONGAP 7 03/11/2022   Last lipids Lab Results  Component Value Date   CHOL 152  07/24/2022   HDL 43 07/24/2022   LDLCALC 72 07/24/2022   TRIG 224 (H) 07/24/2022   CHOLHDL 3.5 07/24/2022   Last hemoglobin A1c Lab Results  Component Value Date   HGBA1C 6.3 (H) 02/04/2023   Last thyroid functions Lab Results  Component Value Date   TSH 2.160 02/04/2023   Last vitamin D Lab Results  Component Value  Date   VD25OH 43.4 02/04/2023   Last vitamin B12 and Folate Lab Results  Component Value Date   VITAMINB12 559 07/24/2022   FOLATE 16.1 07/24/2022     Assessment & Plan:   Problem List Items Addressed This Visit       HTN (hypertension) - Primary    Remains adequately controlled on current antihypertensive regimen consisting of amlodipine 10 mg daily, benazepril 10 mg daily, and Aldactazide 25-25 mg daily.  No medication changes are indicated today.      Type 2 diabetes mellitus with hyperlipidemia (HCC)    A1c 6.3 when updated in July.  He is currently prescribed glipizide 2.5 mg daily. -No medication changes are indicated today -Repeat A1c at follow-up in 3 months -Diabetes related preventative care items are up-to-date      Hyperlipidemia    Currently prescribed Repatha.  Lipid panel updated in January.  Total cholesterol 152 and LDL 72.  Repeat lipid panel at follow-up in 3 months.      Other insomnia    Recently switched from Lunesta back to trazodone 100 mg.  He states his symptoms are improving.  He has also started melatonin Gummies.  No medication changes have been made today.      Vitamin D insufficiency    He remains on daily vitamin D supplementation.  Vitamin D level adequate when updated in July.       Return in about 3 months (around 08/06/2023).    Billie Lade, MD

## 2023-05-06 NOTE — Assessment & Plan Note (Signed)
He remains on daily vitamin D supplementation.  Vitamin D level adequate when updated in July.

## 2023-05-06 NOTE — Assessment & Plan Note (Signed)
Recently switched from Lunesta back to trazodone 100 mg.  He states his symptoms are improving.  He has also started melatonin Gummies.  No medication changes have been made today.

## 2023-05-06 NOTE — Patient Instructions (Signed)
It was a pleasure to see you today.  Thank you for giving [place] the opportunity to be involved in your care.  Below is a brief recap of your visit and next steps.  We will plan to see you again in 3 months.  Summary No medication changes today We will follow up in 3 months

## 2023-05-06 NOTE — Assessment & Plan Note (Signed)
A1c 6.3 when updated in July.  He is currently prescribed glipizide 2.5 mg daily. -No medication changes are indicated today -Repeat A1c at follow-up in 3 months -Diabetes related preventative care items are up-to-date

## 2023-05-06 NOTE — Assessment & Plan Note (Signed)
Remains adequately controlled on current antihypertensive regimen consisting of amlodipine 10 mg daily, benazepril 10 mg daily, and Aldactazide 25-25 mg daily.  No medication changes are indicated today.

## 2023-05-06 NOTE — Assessment & Plan Note (Signed)
Currently prescribed Repatha.  Lipid panel updated in January.  Total cholesterol 152 and LDL 72.  Repeat lipid panel at follow-up in 3 months.

## 2023-05-11 ENCOUNTER — Other Ambulatory Visit: Payer: Self-pay | Admitting: Physician Assistant

## 2023-05-17 ENCOUNTER — Other Ambulatory Visit: Payer: Self-pay | Admitting: Cardiovascular Disease

## 2023-05-17 DIAGNOSIS — I10 Essential (primary) hypertension: Secondary | ICD-10-CM

## 2023-05-20 ENCOUNTER — Other Ambulatory Visit: Payer: Self-pay

## 2023-05-20 DIAGNOSIS — E1169 Type 2 diabetes mellitus with other specified complication: Secondary | ICD-10-CM

## 2023-05-20 MED ORDER — FREESTYLE LIBRE 3 SENSOR MISC
1.0000 | 12 refills | Status: DC
Start: 2023-05-20 — End: 2024-04-17

## 2023-05-25 ENCOUNTER — Other Ambulatory Visit: Payer: Self-pay | Admitting: Cardiology

## 2023-05-31 ENCOUNTER — Other Ambulatory Visit: Payer: Self-pay | Admitting: Cardiovascular Disease

## 2023-06-07 ENCOUNTER — Ambulatory Visit: Payer: Medicare HMO | Attending: Cardiovascular Disease | Admitting: Cardiovascular Disease

## 2023-06-07 ENCOUNTER — Encounter: Payer: Self-pay | Admitting: Cardiovascular Disease

## 2023-06-07 VITALS — BP 136/78 | HR 54 | Ht 66.0 in | Wt 208.6 lb

## 2023-06-07 DIAGNOSIS — Z79899 Other long term (current) drug therapy: Secondary | ICD-10-CM | POA: Diagnosis not present

## 2023-06-07 DIAGNOSIS — E785 Hyperlipidemia, unspecified: Secondary | ICD-10-CM

## 2023-06-07 DIAGNOSIS — I5032 Chronic diastolic (congestive) heart failure: Secondary | ICD-10-CM

## 2023-06-07 DIAGNOSIS — E1169 Type 2 diabetes mellitus with other specified complication: Secondary | ICD-10-CM

## 2023-06-07 DIAGNOSIS — I10 Essential (primary) hypertension: Secondary | ICD-10-CM

## 2023-06-07 DIAGNOSIS — I2510 Atherosclerotic heart disease of native coronary artery without angina pectoris: Secondary | ICD-10-CM | POA: Diagnosis not present

## 2023-06-07 DIAGNOSIS — G4733 Obstructive sleep apnea (adult) (pediatric): Secondary | ICD-10-CM

## 2023-06-07 DIAGNOSIS — Z5181 Encounter for therapeutic drug level monitoring: Secondary | ICD-10-CM | POA: Diagnosis not present

## 2023-06-07 DIAGNOSIS — I493 Ventricular premature depolarization: Secondary | ICD-10-CM | POA: Diagnosis not present

## 2023-06-07 MED ORDER — AMIODARONE HCL 200 MG PO TABS: ORAL_TABLET | ORAL | 3 refills | Status: DC

## 2023-06-07 NOTE — Patient Instructions (Signed)
Medication Instructions:  Take Amiodarone 200 mg Five days a week *If you need a refill on your cardiac medications before your next appointment, please call your pharmacy*  Testing/Procedures: Your physician has requested that you have an echocardiogram in One year- before the patient comes back for one year follow up. Echocardiography is a painless test that uses sound waves to create images of your heart. It provides your doctor with information about the size and shape of your heart and how well your heart's chambers and valves are working. This procedure takes approximately one hour. There are no restrictions for this procedure. Please do NOT wear cologne, perfume, aftershave, or lotions (deodorant is allowed). Please arrive 15 minutes prior to your appointment time.  Please note: We ask at that you not bring children with you during ultrasound (echo/ vascular) testing. Due to room size and safety concerns, children are not allowed in the ultrasound rooms during exams. Our front office staff cannot provide observation of children in our lobby area while testing is being conducted. An adult accompanying a patient to their appointment will only be allowed in the ultrasound room at the discretion of the ultrasound technician under special circumstances. We apologize for any inconvenience.    Follow-Up: At [facility], you and your health needs are our priority.  As part of our continuing mission to provide you with exceptional heart care, we have created designated Provider Care Teams.  These Care Teams include your primary Cardiologist (physician) and Advanced Practice Providers (APPs -  Physician Assistants and Nurse Practitioners) who all work together to provide you with the care you need, when you need it.  We recommend signing up for the patient portal called "MyChart".  Sign up information is provided on this After Visit Summary.  MyChart is used to connect with patients for Virtual  Visits (Telemedicine).  Patients are able to view lab/test results, encounter notes, upcoming appointments, etc.  Non-urgent messages can be sent to your provider as well.   To learn more about what you can do with MyChart, go to [email or web address].    Your next appointment:   1 year(s)  Provider:   Thurmon Fair, MD

## 2023-06-07 NOTE — Progress Notes (Signed)
Patient ID: Douglas Krol., male   DOB: [birth year], 82 y.o.   MRN: [id number]    Cardiology Office Note    Date:  06/07/2023   ID:  Douglas Donning., DOB [birth year], MRN [id number]  PCP:  Billie Lade, MD  Cardiologist:   Thurmon Fair, MD   No chief complaint on file.    History of Present Illness:  Douglas Riehl. is a 82 y.o. male with hyperlipidemia, hypertension, moderate coronary atherosclerosis and a murmur due to aortic valve sclerosis without stenosis.  He had artifactual bradycardia due to very frequent episodes of ventricular bigeminy (overall burden of PVCs 16%, no response to beta-blockers, unsuccessful attempt at ablation due to PVC origin close of the His bundle, good response to amiodarone since November 2022 most recent monitor showed PVC burden 4%).  He also had mild depression in LV systolic function, possibly related to the PVCs.  After roughly a year of treatment with amiodarone ejection fraction improved from 40-45% to 60-65%.  He is feeling better and is going back to the gym.  He denies exertional angina or dyspnea.  He has never been aware of palpitations.  He has not had syncope.  He does not have lower extremity edema orthopnea or PND or claudication.  He had an excellent recovery after right total knee replacement and is planning left total knee replacement in February 2025.  When he exercises on the stationary bicycle his heart rate typically reaches the 90s.  His blood pressure is well-controlled.  He is struggling with falling asleep.  Trazodone did not help.  Melatonin did not help either.  The problem is compounded by the fact that he has difficulty with the CPAP as well, so once he wakes up and takes the CPAP off to go to the bathroom, he has trouble falling asleep again.  He did not tolerate treatment with chlorthalidone due to severe muscle cramps, but is doing well on the spironolactone-hydrochlorothiazide combination.  Treatment with furosemide  was discontinued due to worsening renal function.    An event monitor in November 2018 showed relative bradycardia and a suggestion of chronotropic incompetence.  He did not have any symptoms and a pacemaker was not necessary.  His echocardiogram in 2020 showed borderline LVEF of 45-50% (similar to EF 48% by nuclear stress test in 2016) and impaired relaxation.  Another arrhythmia monitor performed in July 2022 showed fairly frequent PVCs representing 16% of all QRS complexes, often causing effective bradycardia during periods of bigeminy.  He was intolerant of multiple statins even when we tried to supplement coenzyme Q 10.  He is now on Repatha and tolerating it very well, with excellent LDL level.  Cardiac catheterization in 2006 showed  non obstructive atherosclerosis (40% proximal LAD, 40% mid RCA) and had nuclear stress test in 2018 showed normal perfusion, LVEF was only 48% .  Normal ABI bilaterally February 2020  Past Medical History:  Diagnosis Date   Aortic valve sclerosis    Atherosclerosis of coronary artery without angina pectoris 07/05/2014   Cervical spondylosis without myelopathy 02/13/2013   Chest pain 01/29/2019   Degeneration of cervical intervertebral disc 02/13/2013   Depression    sometimes   Diabetes mellitus type 2, diet-controlled (HCC)    Elevated troponin    GERD (gastroesophageal reflux disease)    Heart murmur    HTN (hypertension) 07/06/2013   Hypercholesterolemia 08/21/2015   Hyperlipidemia 07/06/2013   Medication management 08/21/2015   Pre-diabetes    Rectal bleeding 09/21/2019  Stiffness of left shoulder joint 07/19/2013    Past Surgical History:  Procedure Laterality Date   BIOPSY  12/14/2019   Procedure: BIOPSY;  Surgeon: Corbin Ade, MD;  Location: [facility];  Service: Endoscopy;;   CERVICAL SPINE SURGERY     COLONOSCOPY  07/26/2012   Procedure: COLONOSCOPY;  Surgeon: Dalia Heading, MD;  Location: [facility];  Service: Gastroenterology;   Laterality: N/A;   COLONOSCOPY WITH PROPOFOL N/A 12/14/2019   Procedure: COLONOSCOPY WITH PROPOFOL;  Surgeon: Corbin Ade, MD;  Location: [facility];  Service: Endoscopy;  Laterality: N/A;  9:00am   KNEE ARTHROSCOPY Left    torn ligament   LUMBAR LAMINECTOMY/DECOMPRESSION MICRODISCECTOMY Bilateral 12/27/2019   Procedure: BIATERAL LUMBAR TWO- LUMBAR THREE, LUMBAR THREE- LUMBAR FOUR, LUMBAR FOUR-FIVE LAMINOTOMY/FORAMINOTOMY 3 LEVELS;  Surgeon: Tressie Stalker, MD;  Location[facility] OR;  Service: Neurosurgery;  Laterality: Bilateral;  POSTERIOR   PVC ABLATION N/A 04/18/2021   Procedure: PVC ABLATION;  Surgeon: Lanier Prude, MD;  Location: [facility] INVAS[facility] CV LAB;  Service: Cardiovascular;  Laterality: N/A;   REPLACEMENT TOTAL KNEE Right 09/02/2022   SPINE SURGERY N/A    Phreesia 02/24/2020    Outpatient Medications Prior to Visit  Medication Sig Dispense Refill   amLODipine (NORVASC) 10 MG tablet TAKE 1 TABLET EVERY DAY 30 tablet 3   aspirin EC 81 MG tablet Take 81 mg by mouth daily.     benazepril (LOTENSIN) 10 MG tablet TAKE 1 TABLET EVERY DAY 90 tablet 3   blood glucose meter kit and supplies Dispense based on patient and insurance preference. Use up to four times daily as directed. (FOR ICD-10 E10.9, E11.9). 1 each 0   Continuous Glucose Sensor (FREESTYLE LIBRE 3 SENSOR) MISC 1 each by Does not apply route continuous. Place 1 sensor on the skin every 14 days. Use to check glucose continuously 2 each 12   Evolocumab (REPATHA SURECLICK) 140 MG/ML SOAJ INJECT 140 MG INTO THE SKIN EVERY 14 DAYS 6 mL 1   glipiZIDE (GLUCOTROL XL) 2.5 MG 24 hr tablet TAKE 1 TABLET EVERY DAY WITH BREAKFAST 90 tablet 3   magnesium oxide (MAG-OX) 400 MG tablet 1 tablet as needed     meclizine (ANTIVERT) 25 MG tablet Take 1 tablet (25 mg total) by mouth 3 (three) times daily as needed for dizziness. 30 tablet 1   multivitamin-iron-minerals-folic acid (CENTRUM) chewable tablet Chew 1 tablet by mouth daily.      spironolactone-hydrochlorothiazide (ALDACTAZIDE) 25-25 MG tablet TAKE 1 TABLET EVERY OTHER DAY 45 tablet 3   amiodarone (PACERONE) 200 MG tablet Take 1 tablet (200 mg total) by mouth daily. Please call [phone] to schedule an appointment for future refills. 1st attempt. 30 tablet 0   traZODone (DESYREL) 100 MG tablet Take 1 tablet (100 mg total) by mouth at bedtime. (Patient not taking: Reported on 06/07/2023) 30 tablet 1   No facility-administered medications prior to visit.     Allergies:   Metformin and related and Statins   Social History   Socioeconomic History   Marital status: Widowed    Spouse name: Cherly    Number of children: 1   Years of education: Not on file   Highest education level: 12th grade  Occupational History    Comment: Retired   Tobacco Use   Smoking status: Never    Passive exposure: Never   Smokeless tobacco: Never  Vaping Use   Vaping status: Never Used  Substance and Sexual Activity   Alcohol  use: No   Drug use: No   Sexual activity: Not Currently  Other Topics Concern   Not on file  Social History Narrative   Live alone right now, wife passed in 2009    Sabrina Biggs daughter-grandchild 2 live in [place]      Enjoys playing golf      Diet: eats what he likes, sweet, fast food, does not eat a lot of canned or boxed foods.    Does not eat enough fruits and veggies.       Caffeine: 1 cup daily   Water: 3-4 bottles of water daily, Gatorade every now and then         Wears seat belt   Wears sun protection when golfing   Smoke detectors at home   Does not use phone while driving   Social Determinants of Health   Financial Resource Strain: Low Risk  (05/02/2023)   Overall Financial Resource Strain (CARDIA)    Difficulty of Paying Living Expenses: Not hard at all  Food Insecurity: No Food Insecurity (05/02/2023)   Hunger Vital Sign    Worried About Running Out of Food in the Last Year: Never true    Ran Out of Food in the Last Year: Never  true  Transportation Needs: No Transportation Needs (05/02/2023)   PRAPARE - Administrator, Civil Service (Medical): No    Lack of Transportation (Non-Medical): No  Physical Activity: Inactive (05/02/2023)   Exercise Vital Sign    Days of Exercise per Week: 0 days    Minutes of Exercise per Session: 30 min  Stress: Stress Concern Present (05/02/2023)   [employer] of Occupational Health - Occupational Stress Questionnaire    Feeling of Stress : To some extent  Social Connections: Moderately Integrated (05/02/2023)   Social Connection and Isolation Panel [NHANES]    Frequency of Communication with Friends and Family: More than three times a week    Frequency of Social Gatherings with Friends and Family: More than three times a week    Attends Religious Services: More than 4 times per year    Active Member of [employer] or Organizations: Yes    Attends Banker Meetings: More than 4 times per year    Marital Status: Widowed  Recent Concern: Social Connections - Moderately Isolated (04/14/2023)   Social Connection and Isolation Panel [NHANES]    Frequency of Communication with Friends and Family: More than three times a week    Frequency of Social Gatherings with Friends and Family: More than three times a week    Attends Religious Services: More than 4 times per year    Active Member of [employer] or Organizations: No    Attends Banker Meetings: Never    Marital Status: Widowed     Family History:  The patient's family history includes Asthma in his mother; Hypertension in his father.   ROS:   Please see the history of present illness.    All other systems are reviewed and are negative.  PHYSICAL EXAM:   VS:  BP 136/78 (BP Location: Left Arm, Patient Position: Sitting, Cuff Size: Normal)   Pulse (!) 54   Ht 5\' 6"  (1.676 m)   Wt 208 lb 9.6 oz (94.6 kg)   SpO2 95%   BMI 33.67 kg/m      General: Alert, oriented x3, no distress, moderately  obese.  Appears younger than stated age. Head: no evidence of trauma, PERRL, EOMI, no exophtalmos or  lid lag, no myxedema, no xanthelasma; normal ears, nose and oropharynx Neck: normal jugular venous pulsations and no hepatojugular reflux; brisk carotid pulses without delay and no carotid bruits Chest: clear to auscultation, no signs of consolidation by percussion or palpation, normal fremitus, symmetrical and full respiratory excursions Cardiovascular: normal position and quality of the apical impulse, regular rhythm, normal first and second heart sounds, no murmurs, rubs or gallops Abdomen: no tenderness or distention, no masses by palpation, no abnormal pulsatility or arterial bruits, normal bowel sounds, no hepatosplenomegaly Extremities: no clubbing, cyanosis or edema; 2+ radial, ulnar and brachial pulses bilaterally; 2+ right femoral, posterior tibial and dorsalis pedis pulses; 2+ left femoral, posterior tibial and dorsalis pedis pulses; no subclavian or femoral bruits Neurological: grossly nonfocal Psych: Normal mood and affect    Wt Readings from Last 3 Encounters:  06/07/23 208 lb 9.6 oz (94.6 kg)  05/06/23 208 lb 12.8 oz (94.7 kg)  04/14/23 210 lb (95.3 kg)      Studies/Labs Reviewed:   EKG:   EKG Interpretation Date/Time:  Monday June 07 2023 09:30:38 EST Ventricular Rate:  54 PR Interval:  208 QRS Duration:  104 QT Interval:  468 QTC Calculation: 443 R Axis:   -22  Text Interpretation: Sinus bradycardia Mild Non-specific intra-ventricular conduction delay with Left axis deviation When compared with ECG of 11-Mar-2022 01:26, No significant change since last tracing Confirmed by Dirck Butch ([id number]) on 06/07/2023 9:41:41 AM       Arrhythmia monitor 08/11/2022     The dominant rhythm is sinus bradycardia and normal sinus rhythm with normal circadian.  First-degree AV block is seen.   Occasional premature ventricular contractions are seen, representing  approximately 4% of all recorded beats, rarely occurring in the form of couplets.  There is no evidence of ventricular tachycardia.   Or atrial contractions and there is no evidence of atrial fibrillation.   There is no severe bradycardia and there are no pauses.   Abnormal event monitor due to the presence of occasional premature ventricular contractions, representing roughly 4% of all recorded beats.  There is no ventricular tachycardia.   This represents a marked reduction in the severity of ventricular arrhythmia compared with previous evaluations.    echocardiogram 08/04/2022  1. Left ventricular ejection fraction, by estimation, is 60 to 65%. The  left ventricle has normal function. The left ventricle has no regional  wall motion abnormalities. Left ventricular diastolic parameters are  consistent with Grade I diastolic  dysfunction (impaired relaxation). Elevated left atrial pressure.   2. Right ventricular systolic function is normal. The right ventricular  size is normal. Tricuspid regurgitation signal is inadequate for assessing  PA pressure.   3. The mitral valve is normal in structure. No evidence of mitral valve  regurgitation. No evidence of mitral stenosis.   4. The aortic valve is tricuspid. There is mild calcification of the  aortic valve. There is mild thickening of the aortic valve. Aortic valve  regurgitation is mild. No aortic stenosis is present.   5. The inferior vena cava is normal in size with greater than 50%  respiratory variability, suggesting right atrial pressure of 3 mmHg.   Recent Labs: 07/24/2022: Hemoglobin 14.7; Platelets 309 02/04/2023: ALT 21; BUN 16; Creatinine, Ser 1.24; Potassium 4.5; Sodium 143; TSH 2.160  BMET    Component Value Date/Time   NA 143 02/04/2023 0949   K 4.5 02/04/2023 0949   CL 106 02/04/2023 0949   CO2 22 02/04/2023 0949   GLUCOSE 93  02/04/2023 0949   GLUCOSE 108 (H) 03/11/2022 0120   BUN 16 02/04/2023 0949   CREATININE 1.24  02/04/2023 0949   CREATININE 1.13 06/28/2019 0830   CALCIUM 9.3 02/04/2023 0949   GFRNONAA 56 (L) 03/11/2022 0120   GFRNONAA 62 06/28/2019 0830   GFRAA 53 (L) 07/24/2020 0913   GFRAA 72 06/28/2019 0830    Lipid Panel    Component Value Date/Time   CHOL 152 07/24/2022 1552   TRIG 224 (H) 07/24/2022 1552   HDL 43 07/24/2022 1552   CHOLHDL 3.5 07/24/2022 1552   CHOLHDL 3.3 01/29/2019 0800   VLDL 30 01/29/2019 0800   LDLCALC 72 07/24/2022 1552     ASSESSMENT:    1. Chronic diastolic (congestive) heart failure (HCC)   2. PVCs (premature ventricular contractions)   3. Encounter for monitoring amiodarone therapy   4. Primary hypertension   5. Dyslipidemia (high LDL; low HDL)   6. Atherosclerosis of native coronary artery of native heart without angina pectoris   7. Type 2 diabetes mellitus with hyperlipidemia (HCC)   8. OSA (obstructive sleep apnea)      PLAN:  In order of problems listed above:  PVCs: Burden of PVCs down to 4% on most recent monitor and this has been associated with substantial clinical improvement.  Continue amiodarone but will try to use the lowest effective dose.  Reduce amiodarone to 200 mg 5 days a week. Amiodarone: Normal LFTs and TFTs in July, to be repeated in January, also reminded him of the need for yearly eye exam, to stay alert for potential drug interactions, to should promptly report any unexplained respiratory symptoms. CHF: NYHA functional class I with recovered LVEF.  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  Remote coronary angiography did not show any severe stenoses and recent nuclear perfusion study showed normal perfusion, EF 47%.  Euvolemic without diuretics.  Beta-blocker stopped due to bradycardia on amiodarone.  On ACE inhibitor, spironolactone.   HTN: Well-controlled.   HLP: HDL cholesterol is borderline low, triglycerides are slightly high consistent with metabolic syndrome, but LDL is in target range.  Statin intolerant due to myopathy  symptoms.  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 CAD: Asymptomatic.  Diffuse mild nonobstructive disease on previous cardiac catheterization.  Normal recent nuclear perfusion study. DM/obesity: Well-controlled hemoglobin A1c 6.3%.  Encourage further attempts at weight loss. Preop CV eval: Did very well with right knee replacement earlier this year, suspect he will have a very good outcome with contralateral knee replacement as well next year. OSA: He is really struggling with CPAP.  He is interested in the inspire device.  Made a referral to Dr. Jenne Pane to see if he qualifies for this.  I do not have a copy of his sleep study which was performed through [facility].    Medication Adjustments/Labs and Tests Ordered: Current medicines are reviewed at length with the patient today.  Concerns regarding medicines are outlined above.  Medication changes, Labs and Tests ordered today are listed in the Patient Instructions below. Patient Instructions  Medication Instructions:  Take Amiodarone 200 mg Five days a week *If you need a refill on your cardiac medications before your next appointment, please call your pharmacy*  Testing/Procedures: Your physician has requested that you have an echocardiogram in One year- before the patient comes back for one year follow up. Echocardiography is a painless test that uses sound waves to create images of your heart. It provides your doctor with information about the  size and shape of your heart and how well your heart's chambers and valves are working. This procedure takes approximately one hour. There are no restrictions for this procedure. Please do NOT wear cologne, perfume, aftershave, or lotions (deodorant is allowed). Please arrive 15 minutes prior to your appointment time.  Please note: We ask at that you not bring children with you during ultrasound (echo/ vascular) testing. Due to room size and safety  concerns, children are not allowed in the ultrasound rooms during exams. Our front office staff cannot provide observation of children in our lobby area while testing is being conducted. An adult accompanying a patient to their appointment will only be allowed in the ultrasound room at the discretion of the ultrasound technician under special circumstances. We apologize for any inconvenience.    Follow-Up: At [facility], you and your health needs are our priority.  As part of our continuing mission to provide you with exceptional heart care, we have created designated Provider Care Teams.  These Care Teams include your primary Cardiologist (physician) and Advanced Practice Providers (APPs -  Physician Assistants and Nurse Practitioners) who all work together to provide you with the care you need, when you need it.  We recommend signing up for the patient portal called "MyChart".  Sign up information is provided on this After Visit Summary.  MyChart is used to connect with patients for Virtual Visits (Telemedicine).  Patients are able to view lab/test results, encounter notes, upcoming appointments, etc.  Non-urgent messages can be sent to your provider as well.   To learn more about what you can do with MyChart, go to [email or web address].    Your next appointment:   1 year(s)  Provider:   Thurmon Fair, MD         Signed, Thurmon Fair, MD  06/07/2023 11:00 AM    [facility] [address] [place], [place], [place]  [address] Phone: [phone]; Fax: [phone]

## 2023-06-17 ENCOUNTER — Other Ambulatory Visit: Payer: Self-pay | Admitting: Cardiology

## 2023-06-29 ENCOUNTER — Encounter: Payer: Self-pay | Admitting: Podiatry

## 2023-06-29 ENCOUNTER — Ambulatory Visit (INDEPENDENT_AMBULATORY_CARE_PROVIDER_SITE_OTHER): Payer: Medicare HMO | Admitting: Podiatry

## 2023-06-29 DIAGNOSIS — M79675 Pain in left toe(s): Secondary | ICD-10-CM | POA: Diagnosis not present

## 2023-06-29 DIAGNOSIS — E1169 Type 2 diabetes mellitus with other specified complication: Secondary | ICD-10-CM

## 2023-06-29 DIAGNOSIS — B351 Tinea unguium: Secondary | ICD-10-CM | POA: Diagnosis not present

## 2023-06-29 DIAGNOSIS — M79674 Pain in right toe(s): Secondary | ICD-10-CM | POA: Diagnosis not present

## 2023-06-29 DIAGNOSIS — E785 Hyperlipidemia, unspecified: Secondary | ICD-10-CM | POA: Diagnosis not present

## 2023-07-01 DIAGNOSIS — M1712 Unilateral primary osteoarthritis, left knee: Secondary | ICD-10-CM | POA: Diagnosis not present

## 2023-07-02 ENCOUNTER — Telehealth: Payer: Self-pay | Admitting: Cardiovascular Disease

## 2023-07-02 NOTE — Telephone Encounter (Signed)
   Patient Name: Douglas Keller.  DOB: June 26, 1941 MRN: [id number]  Primary Cardiologist: Thurmon Fair, MD  Chart reviewed as part of pre-operative protocol coverage. Given past medical history and time since last visit, based on ACC/AHA guidelines, Douglas Keller. is at acceptable risk for the planned procedure without further cardiovascular testing.   Douglas Keller is not currently on anticoagulation.  Per office protocol, if patient is without any new symptoms or concerns at the time of their virtual visit, Douglas Keller/she may hold ASA for 7 days prior to procedure. Please resume ASA as soon as possible postprocedure, at the discretion of the surgeon.     Per Dr. Royann Shivers 06/07/2023 "Preop CV eval: Did very well with right knee replacement earlier this year, suspect Douglas Keller will have a very good outcome with contralateral knee replacement as well next year."  The patient was advised that if Douglas Keller develops new symptoms prior to surgery to contact our office to arrange for a follow-up visit, and Douglas Keller verbalized understanding.  I will route this recommendation to the requesting party via Epic fax function and remove from pre-op pool.  Please call with questions.  Joni Reining, NP 07/02/2023, 2:47 PM

## 2023-07-02 NOTE — Telephone Encounter (Signed)
   Pre-operative Risk Assessment    Patient Name: Douglas Keller.  DOB: [birth year] MRN: [id number]      Request for Surgical Clearance    Procedure:  Aleft total knee replacement  Date of Surgery:  Clearance TBD                                 Surgeon:  Dr Jodi Geralds Surgeon's Group or Practice Name:   Phone number:  [phone] Fax number:  [phone]   Type of Clearance Requested:  medical and medicine- not clear on medicine   Type of Anesthesia:  Spinal   Additional requests/questions:    Signed, Laurence Ferrari   07/02/2023, 2:25 PM

## 2023-07-05 NOTE — Progress Notes (Signed)
  Subjective:  Patient ID: Douglas Donning., male    DOB: November 08, 1940,  MRN: [id number]  82 y.o. male presents preventative diabetic foot care and painful thick toenails that are difficult to trim. Pain interferes with ambulation. Aggravating factors include wearing enclosed shoe gear. Pain is relieved with periodic professional debridement.  Chief Complaint  Patient presents with   Diabetes    Patient states his feet have been alright since the last visit, patient states he saw his pcp last month on the 18th , patient does not know his A1c.    New problem(s): None   PCP is Douglas Lade, MD.  Allergies  Allergen Reactions   Metformin And Related Diarrhea and Nausea And Vomiting   Statins Other (See Comments)    Muscle/joint pain     Review of Systems: Negative except as noted in the HPI.   Objective:  Douglas Uchida. is a pleasant 82 y.o. male WD, WN in NAD. AAO x 3.  Vascular Examination: Vascular status intact b/l with palpable pedal pulses. CFT immediate b/l. Pedal hair present. No edema. No pain with calf compression b/l. Skin temperature gradient WNL b/l. No varicosities noted. No cyanosis or clubbing noted.  Neurological Examination: Sensation grossly intact b/l with 10 gram monofilament. Vibratory sensation intact b/l.  Dermatological Examination: Pedal skin with normal turgor, texture and tone b/l. No open wounds nor interdigital macerations noted. Toenails 1-5 b/l thick, discolored, elongated with subungual debris and pain on dorsal palpation. No hyperkeratotic lesions noted b/l.   Musculoskeletal Examination: Muscle strength 5/5 to b/l LE.  No pain, crepitus noted b/l. No gross pedal deformities. Patient ambulates independently without assistive aids.   Radiographs: None  Last A1c:      Latest Ref Rng & Units 02/04/2023    9:49 AM 07/24/2022    3:52 PM  Hemoglobin A1C  Hemoglobin-A1c 4.8 - 5.6 % 6.3  6.2    Assessment:   1. Pain due to onychomycosis  of toenails of both feet   2. Type 2 diabetes mellitus with hyperlipidemia (HCC)    Plan:  -Consent given for treatment as described below: -Examined patient. -Continue supportive shoe gear daily. -Mycotic toenails 1-5 bilaterally were debrided in length and girth with sterile nail nippers and dremel without incident. -Patient/POA to call should there be question/concern in the interim.  Return in about 3 months (around 09/27/2023).  Freddie Breech, DPM      [facility] LOCATION: [address] [place] [address]                   Office [phone]   [facility] LOCATION: [address] [place], [place] [address] Office [phone]

## 2023-07-09 ENCOUNTER — Emergency Department (HOSPITAL_COMMUNITY): Payer: Medicare HMO

## 2023-07-09 ENCOUNTER — Emergency Department (HOSPITAL_COMMUNITY)
Admission: EM | Admit: 2023-07-09 | Discharge: 2023-07-09 | Disposition: A | Discharge status: Home / Self Care | Payer: Medicare HMO | Attending: Emergency Medicine | Admitting: Emergency Medicine

## 2023-07-09 ENCOUNTER — Other Ambulatory Visit: Payer: Self-pay

## 2023-07-09 ENCOUNTER — Encounter (HOSPITAL_COMMUNITY): Payer: Self-pay

## 2023-07-09 DIAGNOSIS — R079 Chest pain, unspecified: Secondary | ICD-10-CM | POA: Diagnosis not present

## 2023-07-09 DIAGNOSIS — M79602 Pain in left arm: Secondary | ICD-10-CM

## 2023-07-09 DIAGNOSIS — K810 Acute cholecystitis: Secondary | ICD-10-CM | POA: Diagnosis not present

## 2023-07-09 DIAGNOSIS — K429 Umbilical hernia without obstruction or gangrene: Secondary | ICD-10-CM | POA: Diagnosis not present

## 2023-07-09 DIAGNOSIS — K8020 Calculus of gallbladder without cholecystitis without obstruction: Secondary | ICD-10-CM | POA: Diagnosis not present

## 2023-07-09 DIAGNOSIS — I10 Essential (primary) hypertension: Secondary | ICD-10-CM | POA: Diagnosis not present

## 2023-07-09 DIAGNOSIS — R1010 Upper abdominal pain, unspecified: Secondary | ICD-10-CM

## 2023-07-09 DIAGNOSIS — E119 Type 2 diabetes mellitus without complications: Secondary | ICD-10-CM | POA: Diagnosis not present

## 2023-07-09 DIAGNOSIS — I700 Atherosclerosis of aorta: Secondary | ICD-10-CM | POA: Diagnosis not present

## 2023-07-09 DIAGNOSIS — R1032 Left lower quadrant pain: Secondary | ICD-10-CM

## 2023-07-09 DIAGNOSIS — K828 Other specified diseases of gallbladder: Secondary | ICD-10-CM | POA: Diagnosis not present

## 2023-07-09 DIAGNOSIS — R109 Unspecified abdominal pain: Secondary | ICD-10-CM | POA: Diagnosis not present

## 2023-07-09 DIAGNOSIS — R1012 Left upper quadrant pain: Secondary | ICD-10-CM | POA: Diagnosis not present

## 2023-07-09 LAB — COMPREHENSIVE METABOLIC PANEL
ALT: 34 U/L (ref 0–44)
AST: 36 U/L (ref 15–41)
Albumin: 4 g/dL (ref 3.5–5.0)
Alkaline Phosphatase: 63 U/L (ref 38–126)
Anion gap: 8 (ref 5–15)
BUN: 16 mg/dL (ref 8–23)
CO2: 23 mmol/L (ref 22–32)
Calcium: 8.8 mg/dL — ABNORMAL LOW (ref 8.9–10.3)
Chloride: 105 mmol/L (ref 98–111)
Creatinine, Ser: 1.17 mg/dL (ref 0.61–1.24)
GFR, Estimated: >60 mL/min (ref >60)
Glucose, Bld: 108 mg/dL — ABNORMAL HIGH (ref 70–99)
Potassium: 3.8 mmol/L (ref 3.5–5.1)
Sodium: 136 mmol/L (ref 135–145)
Total Bilirubin: 0.8 mg/dL (ref <1.2)
Total Protein: 7.3 g/dL (ref 6.5–8.1)

## 2023-07-09 LAB — URINALYSIS, ROUTINE W REFLEX MICROSCOPIC
Bilirubin Urine: NEGATIVE
Glucose, UA: NEGATIVE mg/dL
Hgb urine dipstick: NEGATIVE
Ketones, ur: NEGATIVE mg/dL
Leukocytes,Ua: NEGATIVE
Nitrite: NEGATIVE
Protein, ur: NEGATIVE mg/dL
Specific Gravity, Urine: >1.046 — ABNORMAL HIGH (ref 1.005–1.030)
pH: 5 (ref 5.0–8.0)

## 2023-07-09 LAB — CBC
HCT: 45.4 % (ref 39.0–52.0)
Hemoglobin: 15 g/dL (ref 13.0–17.0)
MCH: 31.1 pg (ref 26.0–34.0)
MCHC: 33 g/dL (ref 30.0–36.0)
MCV: 94.2 fL (ref 80.0–100.0)
Platelets: 328 10*3/uL (ref 150–400)
RBC: 4.82 MIL/uL (ref 4.22–5.81)
RDW: 12.9 % (ref 11.5–15.5)
WBC: 9.5 10*3/uL (ref 4.0–10.5)
nRBC: 0 % (ref 0.0–0.2)

## 2023-07-09 LAB — TROPONIN I (HIGH SENSITIVITY)
Troponin I (High Sensitivity): 24 ng/L — ABNORMAL HIGH (ref <18)
Troponin I (High Sensitivity): 21 ng/L — ABNORMAL HIGH (ref <18)

## 2023-07-09 LAB — LIPASE, BLOOD: Lipase: 49 U/L (ref 11–51)

## 2023-07-09 LAB — CBG MONITORING, ED: Glucose-Capillary: 99 mg/dL (ref 70–99)

## 2023-07-09 MED ORDER — OXYCODONE-ACETAMINOPHEN 5-325 MG PO TABS: 1.0000 | ORAL_TABLET | Freq: Four times a day (QID) | ORAL | 0 refills | Status: DC | PRN

## 2023-07-09 MED ORDER — IOHEXOL 300 MG/ML  SOLN
100.0000 mL | Freq: Once | INTRAMUSCULAR | Status: AC | PRN
  Administered 2023-07-09: 100 mL via INTRAVENOUS

## 2023-07-09 MED ORDER — ONDANSETRON 4 MG PO TBDP: ORAL_TABLET | ORAL | 0 refills | Status: DC

## 2023-07-09 NOTE — Discharge Instructions (Signed)
If you have severe pain, you need to go to an emergency department and get seen.  You have a gallbladder that given your problems.  If you do okay over the next 2 weeks then when you get back in town you need to get an appointment with Dr. Franky Macho or one of his colleagues.  He is a Careers adviser I spoke to about you

## 2023-07-09 NOTE — ED Triage Notes (Signed)
Pt woke up around 0400 this morning with generalized abdominal pain that felt similar to bloating. Took two gas pills with some relief. Pt was traveling when the tingling occurred in the left arm which radiates to the fingers

## 2023-07-09 NOTE — ED Provider Notes (Signed)
Patient with abdominal pain which most likely is related to his gallbladder.  Patient has no pain now no fever no vomiting and no white count.  I spoke with general surgery and they recommended the patient to be followed back up next week.  The patient states she is going to be out of town for the next 2 weeks.  I gave him pain medicine and nausea medicine and told him if he has any problems he needs to go to an emergency department but if he does well over the next 2 weeks he can see Dr. Lovell Sheehan when he returns to Venita Sheffield, MD 07/09/23 1254

## 2023-07-09 NOTE — ED Provider Notes (Signed)
[facility] Emergency Department Provider Note MRN:  [id number]  Arrival date & time: 07/09/23     Chief Complaint   Abdominal Pain   History of Present Illness   Douglas Bucco. is a 82 y.o. year-old male with a history of diabetes presenting to the ED with chief complaint of abdominal pain.  Driving on the highway and experienced some left lower quadrant abdominal pain.  Also some pain and tingling to the left arm.  Decided to come here to be evaluated.  Symptoms lasted 10 to 15 minutes and then resolved.  Had some lower abdominal pain earlier in the day for couple hours and then it went away.  Denies fever, no dysuria or hematuria, no chest pain or shortness of breath.  Review of Systems  A thorough review of systems was obtained and all systems are negative except as noted in the HPI and PMH.   Patient's Health History    Past Medical History:  Diagnosis Date   Aortic valve sclerosis    Atherosclerosis of coronary artery without angina pectoris 07/05/2014   Cervical spondylosis without myelopathy 02/13/2013   Chest pain 01/29/2019   Degeneration of cervical intervertebral disc 02/13/2013   Depression    sometimes   Diabetes mellitus type 2, diet-controlled (HCC)    Elevated troponin    GERD (gastroesophageal reflux disease)    Heart murmur    HTN (hypertension) 07/06/2013   Hypercholesterolemia 08/21/2015   Hyperlipidemia 07/06/2013   Medication management 08/21/2015   Pre-diabetes    Rectal bleeding 09/21/2019   Stiffness of left shoulder joint 07/19/2013    Past Surgical History:  Procedure Laterality Date   BIOPSY  12/14/2019   Procedure: BIOPSY;  Surgeon: Corbin Ade, MD;  [facility];  Service: Endoscopy;;   CERVICAL SPINE SURGERY     COLONOSCOPY  07/26/2012   Procedure: COLONOSCOPY;  Surgeon: Dalia Heading, MD;  [facility];  Service: Gastroenterology;  Laterality: N/A;   COLONOSCOPY WITH PROPOFOL N/A 12/14/2019    Procedure: COLONOSCOPY WITH PROPOFOL;  Surgeon: Corbin Ade, MD;  Location[facility];  Service: Endoscopy;  Laterality: N/A;  9:00am   KNEE ARTHROSCOPY Left    torn ligament   LUMBAR LAMINECTOMY/DECOMPRESSION MICRODISCECTOMY Bilateral 12/27/2019   Procedure: BIATERAL LUMBAR TWO- LUMBAR THREE, LUMBAR THREE- LUMBAR FOUR, LUMBAR FOUR-FIVE LAMINOTOMY/FORAMINOTOMY 3 LEVELS;  Surgeon: Tressie Stalker, MD;  Location: [facility] OR;  Service: Neurosurgery;  Laterality: Bilateral;  POSTERIOR   PVC ABLATION N/A 04/18/2021   Procedure: PVC ABLATION;  Surgeon: Lanier Prude, MD;  [facility];  Service: Cardiovascular;  Laterality: N/A;   REPLACEMENT TOTAL KNEE Right 09/02/2022   SPINE SURGERY N/A    Phreesia 02/24/2020    Family History  Problem Relation Age of Onset   Asthma Mother    Hypertension Father    Colon cancer Neg Hx     Social History   Socioeconomic History   Marital status: Widowed    Spouse name: Cherly    Number of children: 1   Years of education: Not on file   Highest education level: 12th grade  Occupational History    Comment: Retired   Tobacco Use   Smoking status: Never    Passive exposure: Never   Smokeless tobacco: Never  Vaping Use   Vaping status: Never Used  Substance and Sexual Activity   Alcohol use: No   Drug use: No   Sexual activity: Not Currently  Other Topics Concern   Not on file  Social History Narrative   Live alone right now, wife passed in 2009    Sabrina Biggs daughter-grandchild 2 live in [place]      Enjoys playing golf      Diet: eats what he likes, sweet, fast food, does not eat a lot of canned or boxed foods.    Does not eat enough fruits and veggies.       Caffeine: 1 cup daily   Water: 3-4 bottles of water daily, Gatorade every now and then         Wears seat belt   Wears sun protection when golfing   Smoke detectors at home   Does not use phone while driving   Social Drivers of Health   Financial  Resource Strain: Low Risk  (05/02/2023)   Overall Financial Resource Strain (CARDIA)    Difficulty of Paying Living Expenses: Not hard at all  Food Insecurity: No Food Insecurity (05/02/2023)   Hunger Vital Sign    Worried About Running Out of Food in the Last Year: Never true    Ran Out of Food in the Last Year: Never true  Transportation Needs: No Transportation Needs (05/02/2023)   PRAPARE - Administrator, Civil Service (Medical): No    Lack of Transportation (Non-Medical): No  Physical Activity: Inactive (05/02/2023)   Exercise Vital Sign    Days of Exercise per Week: 0 days    Minutes of Exercise per Session: 30 min  Stress: Stress Concern Present (05/02/2023)   [employer] of Occupational Health - Occupational Stress Questionnaire    Feeling of Stress : To some extent  Social Connections: Moderately Integrated (05/02/2023)   Social Connection and Isolation Panel [NHANES]    Frequency of Communication with Friends and Family: More than three times a week    Frequency of Social Gatherings with Friends and Family: More than three times a week    Attends Religious Services: More than 4 times per year    Active Member of [employer] or Organizations: Yes    Attends Banker Meetings: More than 4 times per year    Marital Status: Widowed  Recent Concern: Social Connections - Moderately Isolated (04/14/2023)   Social Connection and Isolation Panel [NHANES]    Frequency of Communication with Friends and Family: More than three times a week    Frequency of Social Gatherings with Friends and Family: More than three times a week    Attends Religious Services: More than 4 times per year    Active Member of [employer] or Organizations: No    Attends Banker Meetings: Never    Marital Status: Widowed  Intimate Partner Violence: Not At Risk (04/14/2023)   Humiliation, Afraid, Rape, and Kick questionnaire    Fear of Current or Ex-Partner: No    Emotionally  Abused: No    Physically Abused: No    Sexually Abused: No     Physical Exam   Vitals:   07/09/23 0627  BP: (!) 157/85  Pulse: 67  Resp: 17  Temp: 97.9 F (36.6 C)  SpO2: 98%    CONSTITUTIONAL: Well-appearing, NAD NEURO/PSYCH:  Alert and oriented x 3, no focal deficits EYES:  eyes equal and reactive ENT/NECK:  no LAD, no JVD CARDIO: Regular rate, well-perfused, normal S1 and S2 PULM:  CTAB no wheezing or rhonchi GI/GU:  non-distended, non-tender MSK/SPINE:  No gross deformities, no edema SKIN:  no rash, atraumatic   *  Additional and/or pertinent findings included in MDM below  Diagnostic and Interventional Summary    EKG Interpretation Date/Time:  Friday July 09 2023 07:15:33 EST Ventricular Rate:  54 PR Interval:  217 QRS Duration:  109 QT Interval:  466 QTC Calculation: 442 R Axis:   -22  Text Interpretation: Sinus rhythm Borderline prolonged PR interval Borderline left axis deviation Consider anterior infarct Confirmed by Kennis Carina [phone]) on 07/09/2023 7:21:32 AM       Labs Reviewed  CBC  COMPREHENSIVE METABOLIC PANEL  LIPASE, BLOOD  URINALYSIS, ROUTINE W REFLEX MICROSCOPIC  TROPONIN I (HIGH SENSITIVITY)    DG Chest Port 1 View    (Results Pending)  CT ABDOMEN PELVIS W CONTRAST    (Results Pending)    Medications - No data to display   Procedures  /  Critical Care Procedures  ED Course and Medical Decision Making  Initial Impression and Ddx Differential diagnosis includes ACS, kidney stone, diverticulitis however patient is very well-appearing and symptoms have resolved, normal vital signs, pain possibly gas related.  Past medical/surgical history that increases complexity of ED encounter: Hypertension, diabetes  Interpretation of Diagnostics I personally reviewed the EKG and my interpretation is as follows: Sinus rhythm without ischemic changes  Labs and CT pending  Patient Reassessment and Ultimate Disposition/Management      Signed out to oncoming provider at shift change.  Patient management required discussion with the following services or consulting groups:  None  Complexity of Problems Addressed Acute illness or injury that poses threat of life of bodily function  Additional Data Reviewed and Analyzed Further history obtained from: Prior labs/imaging results  Additional Factors Impacting ED Encounter Risk Consideration of hospitalization  Elmer Sow. Pilar Plate, MD [facility] Health Emergency Medicine [facility] [email or web address]  Final Clinical Impressions(s) / ED Diagnoses     ICD-10-CM   1. LLQ pain  R10.32     2. Pain of left upper extremity  M79.602       ED Discharge Orders     None        Discharge Instructions Discussed with and Provided to Patient:   Discharge Instructions   None      Sabas Sous, MD 07/09/23 [phone]

## 2023-07-24 ENCOUNTER — Other Ambulatory Visit: Payer: Self-pay | Admitting: Physician Assistant

## 2023-08-03 ENCOUNTER — Ambulatory Visit: Payer: Medicare HMO | Admitting: General Surgery

## 2023-08-03 ENCOUNTER — Encounter: Payer: Self-pay | Admitting: General Surgery

## 2023-08-03 VITALS — BP 117/80 | HR 65 | Temp 97.6°F | Resp 16 | Ht 66.0 in | Wt 214.0 lb

## 2023-08-03 DIAGNOSIS — K8020 Calculus of gallbladder without cholecystitis without obstruction: Secondary | ICD-10-CM | POA: Diagnosis not present

## 2023-08-03 NOTE — Progress Notes (Signed)
 Douglas Keller.; [id number]; [birth year]   HPI Patient is an 83 year old black male who was referred to my care by the emergency room for evaluation treatment of cholelithiasis.  He was seen in the emergency room on 07/09/2023 for left lower quadrant abdominal pain.  Workup revealed cholelithiasis with a slightly thickened gallbladder wall.  He did not have any right upper quadrant abdominal pain at that time.  His liver tests were within normal limits.  He was going out of town for several weeks and now comes to my care for evaluation.  He denies any fever, chills, or jaundice.  He states that he primarily gets abdominal bloating with occasional nausea, but it seems more related to his constipation.  He also states he has been having various dizzy spells, but the etiology of which is unknown.  He is on cardiac medications and his amiodarone  was adjusted 2 months ago.  He does not relate any particular target foods that cause him discomfort.  He denies any right upper quadrant abdominal pain with radiation to the right flank or right shoulder.  He does report some fatigue with exertion. Past Medical History:  Diagnosis Date   Aortic valve sclerosis    Atherosclerosis of coronary artery without angina pectoris 07/05/2014   Cervical spondylosis without myelopathy 02/13/2013   Chest pain 01/29/2019   Degeneration of cervical intervertebral disc 02/13/2013   Depression    sometimes   Diabetes mellitus type 2, diet-controlled (HCC)    Elevated troponin    GERD (gastroesophageal reflux disease)    Heart murmur    HTN (hypertension) 07/06/2013   Hypercholesterolemia 08/21/2015   Hyperlipidemia 07/06/2013   Medication management 08/21/2015   Pre-diabetes    Rectal bleeding 09/21/2019   Stiffness of left shoulder joint 07/19/2013    Past Surgical History:  Procedure Laterality Date   BIOPSY  12/14/2019   Procedure: BIOPSY;  Surgeon: Shaaron Lamar HERO, MD;  [facility];  Service: Endoscopy;;    CERVICAL SPINE SURGERY     COLONOSCOPY  07/26/2012   Procedure: COLONOSCOPY;  Surgeon: Oneil DELENA Budge, MD;  [facility];  Service: Gastroenterology;  Laterality: N/A;   COLONOSCOPY WITH PROPOFOL  N/A 12/14/2019   Procedure: COLONOSCOPY WITH PROPOFOL ;  Surgeon: Shaaron Lamar HERO, MD;  [facility];  Service: Endoscopy;  Laterality: N/A;  9:00am   KNEE ARTHROSCOPY Left    torn ligament   LUMBAR LAMINECTOMY/DECOMPRESSION MICRODISCECTOMY Bilateral 12/27/2019   Procedure: BIATERAL LUMBAR TWO- LUMBAR THREE, LUMBAR THREE- LUMBAR FOUR, LUMBAR FOUR-FIVE LAMINOTOMY/FORAMINOTOMY 3 LEVELS;  Surgeon: Budge Purchase, MD;  Location: [facility] OR;  Service: Neurosurgery;  Laterality: Bilateral;  POSTERIOR   PVC ABLATION N/A 04/18/2021   Procedure: PVC ABLATION;  Surgeon: Cindie Ole DASEN, MD;  [facility];  Service: Cardiovascular;  Laterality: N/A;   REPLACEMENT TOTAL KNEE Right 09/02/2022   SPINE SURGERY N/A    Phreesia 02/24/2020    Family History  Problem Relation Age of Onset   Asthma Mother    Hypertension Father    Colon cancer Neg Hx     Current Outpatient Medications on File Prior to Visit  Medication Sig Dispense Refill   amiodarone  (PACERONE ) 200 MG tablet Take 200 mg five days a week 90 tablet 3   amLODipine  (NORVASC ) 10 MG tablet TAKE 1 TABLET EVERY DAY 90 tablet 3   aspirin  EC 81 MG tablet Take 81 mg by mouth daily.     benazepril  (LOTENSIN ) 10 MG tablet TAKE 1  TABLET EVERY DAY 90 tablet 3   blood glucose meter kit and supplies Dispense based on patient and insurance preference. Use up to four times daily as directed. (FOR ICD-10 E10.9, E11.9). 1 each 0   Continuous Glucose Sensor (FREESTYLE LIBRE 3 SENSOR) MISC 1 each by Does not apply route continuous. Place 1 sensor on the skin every 14 days. Use to check glucose continuously 2 each 12   Evolocumab  (REPATHA  SURECLICK) 140 MG/ML SOAJ INJECT 140 MG INTO THE SKIN EVERY 14 DAYS 6 mL 1   glipiZIDE   (GLUCOTROL  XL) 2.5 MG 24 hr tablet TAKE 1 TABLET EVERY DAY WITH BREAKFAST 90 tablet 3   magnesium  oxide (MAG-OX) 400 MG tablet 1 tablet as needed     meclizine  (ANTIVERT ) 25 MG tablet Take 1 tablet (25 mg total) by mouth 3 (three) times daily as needed for dizziness. 30 tablet 1   multivitamin-iron-minerals-folic acid  (CENTRUM) chewable tablet Chew 1 tablet by mouth daily.     ondansetron  (ZOFRAN -ODT) 4 MG disintegrating tablet 4mg  ODT q4 hours prn nausea/vomit 10 tablet 0   oxyCODONE -acetaminophen  (PERCOCET/ROXICET) 5-325 MG tablet Take 1 tablet by mouth every 6 (six) hours as needed for severe pain (pain score 7-10). 15 tablet 0   spironolactone -hydrochlorothiazide  (ALDACTAZIDE ) 25-25 MG tablet TAKE 1 TABLET EVERY OTHER DAY 45 tablet 3   traZODone  (DESYREL ) 100 MG tablet Take 1 tablet (100 mg total) by mouth at bedtime. 30 tablet 1   No current facility-administered medications on file prior to visit.    Allergies  Allergen Reactions   Metformin  And Related Diarrhea and Nausea And Vomiting   Statins Other (See Comments)    Muscle/joint pain     Social History   Substance and Sexual Activity  Alcohol Use No    Social History   Tobacco Use  Smoking Status Never   Passive exposure: Never  Smokeless Tobacco Never    Review of Systems  Constitutional:  Positive for malaise/fatigue.  HENT: Negative.    Eyes:  Positive for blurred vision.  Respiratory:  Positive for shortness of breath.   Cardiovascular: Negative.   Gastrointestinal:  Positive for abdominal pain, constipation, heartburn and nausea.  Musculoskeletal:  Positive for back pain, joint pain and neck pain.  Skin: Negative.   Neurological:  Positive for dizziness and sensory change.  Endo/Heme/Allergies: Negative.   Psychiatric/Behavioral: Negative.      Objective   Vitals:   08/03/23 1508  BP: 117/80  Pulse: 65  Resp: 16  Temp: 97.6 F (36.4 C)  SpO2: 94%    Physical Exam Vitals reviewed.   Constitutional:      Appearance: Normal appearance. He is not ill-appearing.  HENT:     Head: Normocephalic and atraumatic.  Eyes:     General: No scleral icterus. Cardiovascular:     Rate and Rhythm: Normal rate and regular rhythm.     Heart sounds: Normal heart sounds. No murmur heard.    No friction rub. No gallop.  Pulmonary:     Effort: Pulmonary effort is normal. No respiratory distress.     Breath sounds: Normal breath sounds. No stridor. No wheezing, rhonchi or rales.  Abdominal:     General: Bowel sounds are normal. There is no distension.     Palpations: Abdomen is soft. There is no mass.     Tenderness: There is no abdominal tenderness. There is no guarding or rebound.     Hernia: No hernia is present.     Comments: Patient does have  a rotund abdomen.  No right upper quadrant abdominal pain is elicited on palpation.  Skin:    General: Skin is warm and dry.  Neurological:     Mental Status: He is alert and oriented to person, place, and time.    ER notes reviewed and, CT scan and ultrasound reports reviewed. Assessment  Cholelithiasis.  Patient has atypical symptoms for biliary colic.  Is difficult to elicit an etiology for all his symptoms.  This may be medication related.  The slightly thickened gallbladder wall may be secondary to a component of heart failure. Plan  The patient is seeing his primary care physician, Dr. Melvenia, this Friday.  I told him to discuss with him his symptoms to see whether or not they may be medication related.  I think the risk of surgery outweighs the benefits at the present time.  That being said, should he have worsening symptoms of biliary colic, I would be more than happy to do a cholecystectomy.  He will return to my care as needed.  Literature was given.

## 2023-08-06 ENCOUNTER — Encounter: Payer: Self-pay | Admitting: Internal Medicine

## 2023-08-06 ENCOUNTER — Ambulatory Visit (INDEPENDENT_AMBULATORY_CARE_PROVIDER_SITE_OTHER): Payer: Medicare HMO | Admitting: Internal Medicine

## 2023-08-06 VITALS — BP 127/74 | HR 79 | Ht 66.0 in | Wt 210.2 lb

## 2023-08-06 DIAGNOSIS — E1169 Type 2 diabetes mellitus with other specified complication: Secondary | ICD-10-CM

## 2023-08-06 DIAGNOSIS — E7800 Pure hypercholesterolemia, unspecified: Secondary | ICD-10-CM

## 2023-08-06 DIAGNOSIS — K8020 Calculus of gallbladder without cholecystitis without obstruction: Secondary | ICD-10-CM | POA: Insufficient documentation

## 2023-08-06 DIAGNOSIS — I10 Essential (primary) hypertension: Secondary | ICD-10-CM | POA: Diagnosis not present

## 2023-08-06 DIAGNOSIS — E785 Hyperlipidemia, unspecified: Secondary | ICD-10-CM | POA: Diagnosis not present

## 2023-08-06 DIAGNOSIS — E66811 Obesity, class 1: Secondary | ICD-10-CM | POA: Diagnosis not present

## 2023-08-06 DIAGNOSIS — Z7984 Long term (current) use of oral hypoglycemic drugs: Secondary | ICD-10-CM

## 2023-08-06 DIAGNOSIS — E559 Vitamin D deficiency, unspecified: Secondary | ICD-10-CM | POA: Diagnosis not present

## 2023-08-06 NOTE — Assessment & Plan Note (Signed)
We will panel updated in January 2024.  Total cholesterol 152 and LDL 72.  He is currently prescribed Repatha.  Repeat lipid panel ordered today.

## 2023-08-06 NOTE — Patient Instructions (Signed)
It was a pleasure to see you today.  Thank you for giving [place] the opportunity to be involved in your care.  Below is a brief recap of your visit and next steps.  We will plan to see you again in 3 months.  Summary Stop glipizide Repeat labs ordered Follow up in 3 months

## 2023-08-06 NOTE — Progress Notes (Signed)
Established Patient Office Visit  Subjective   [name]., male    DOB: 24-Feb-1941  Age: 83 y.o. MRN: [id number]  Chief Complaint  Patient presents with   Hypertension    Three month follow up    Mr. Douglas Keller returns to care today for routine follow-up.  He was last evaluated by me in October 2024.  No medication changes were made at that time and 68-month follow-up was arranged.  In the interim, he has been seen by cardiology and podiatry for follow-up.  ED presentation on 12/20 endorsing abdominal pain, predominantly left lower quadrant abdominal pain.  CT of the abdomen showed equivocal findings for cholecystitis.  RUQ U/S showed cholelithiasis consistent with possible cholecystitis.  Ultimately, Mr. Douglas Keller case was discussed with general surgery and he was seen by Dr. Lovell Sheehan on 1/14 for further evaluation.  His symptoms are atypical for biliary colic.  It is currently felt that the risks of surgery outweigh the benefits.  Close PCP follow-up recommended to discuss whether or not symptoms may related to his medications.  There have otherwise been no acute interval events.  Mr. Douglas Keller reports feeling well today.  He is asymptomatic currently and denies recurrence of abdominal pain since ED presentation in late December.  Past Medical History:  Diagnosis Date   Aortic valve sclerosis    Atherosclerosis of coronary artery without angina pectoris 07/05/2014   Cervical spondylosis without myelopathy 02/13/2013   Chest pain 01/29/2019   Degeneration of cervical intervertebral disc 02/13/2013   Depression    sometimes   Diabetes mellitus type 2, diet-controlled (HCC)    Elevated troponin    GERD (gastroesophageal reflux disease)    Heart murmur    HTN (hypertension) 07/06/2013   Hypercholesterolemia 08/21/2015   Hyperlipidemia 07/06/2013   Medication management 08/21/2015   Pre-diabetes    Rectal bleeding 09/21/2019   Stiffness of left shoulder joint 07/19/2013   Past  Surgical History:  Procedure Laterality Date   BIOPSY  12/14/2019   Procedure: BIOPSY;  Surgeon: Corbin Ade, MD;  [facility]: [facility];  Service: Endoscopy;;   CERVICAL SPINE SURGERY     COLONOSCOPY  07/26/2012   Procedure: COLONOSCOPY;  Surgeon: Dalia Heading, MD;  Location: [facility];  Service: Gastroenterology;  Laterality: N/A;   COLONOSCOPY WITH PROPOFOL N/A 12/14/2019   Procedure: COLONOSCOPY WITH PROPOFOL;  Surgeon: Corbin Ade, MD;  Location: [facility];  Service: Endoscopy;  Laterality: N/A;  9:00am   KNEE ARTHROSCOPY Left    torn ligament   LUMBAR LAMINECTOMY/DECOMPRESSION MICRODISCECTOMY Bilateral 12/27/2019   Procedure: BIATERAL LUMBAR TWO- LUMBAR THREE, LUMBAR THREE- LUMBAR FOUR, LUMBAR FOUR-FIVE LAMINOTOMY/FORAMINOTOMY 3 LEVELS;  Surgeon: Tressie Stalker, MD;  Location: [facility];  Service: Neurosurgery;  Laterality: Bilateral;  POSTERIOR   PVC ABLATION N/A 04/18/2021   Procedure: PVC ABLATION;  Surgeon: Lanier Prude, MD;  Location: [facility];  Service: Cardiovascular;  Laterality: N/A;   REPLACEMENT TOTAL KNEE Right 09/02/2022   SPINE SURGERY N/A    Phreesia 02/24/2020   Social History   Tobacco Use   Smoking status: Never    Passive exposure: Never   Smokeless tobacco: Never  Vaping Use   Vaping status: Never Used  Substance Use Topics   Alcohol use: No   Drug use: No   Family History  Problem Relation Age of Onset   Asthma Mother    Hypertension Father    Colon cancer Neg Hx  Allergies  Allergen Reactions   Metformin And Related Diarrhea and Nausea And Vomiting   Statins Other (See Comments)    Muscle/joint pain    Review of Systems  Constitutional:  Negative for chills and fever.  HENT:  Negative for sore throat.   Respiratory:  Negative for cough and shortness of breath.   Cardiovascular:  Negative for chest pain, palpitations and leg swelling.  Gastrointestinal:  Negative for abdominal pain, blood in stool,  constipation, diarrhea, nausea and vomiting.  Genitourinary:  Negative for dysuria and hematuria.  Musculoskeletal:  Negative for myalgias.  Skin:  Negative for itching and rash.  Neurological:  Negative for dizziness and headaches.  Psychiatric/Behavioral:  Negative for depression and suicidal ideas.      Objective:     BP 127/74 (BP Location: Right Arm, Patient Position: Sitting, Cuff Size: Large)   Pulse 79   Ht 5\' 6"  (1.676 m)   Wt 210 lb 3.2 oz (95.3 kg)   SpO2 96%   BMI 33.93 kg/m  BP Readings from Last 3 Encounters:  08/06/23 127/74  08/03/23 117/80  07/09/23 (!) 143/83   Physical Exam Vitals reviewed.  Constitutional:      General: He is not in acute distress.    Appearance: Normal appearance. He is obese. He is not ill-appearing.  HENT:     Head: Normocephalic and atraumatic.     Right Ear: External ear normal.     Left Ear: External ear normal.     Nose: Nose normal. No congestion or rhinorrhea.     Mouth/Throat:     Mouth: Mucous membranes are moist.     Pharynx: Oropharynx is clear.  Eyes:     General: No scleral icterus.    Extraocular Movements: Extraocular movements intact.     Conjunctiva/sclera: Conjunctivae normal.     Pupils: Pupils are equal, round, and reactive to light.  Cardiovascular:     Rate and Rhythm: Normal rate and regular rhythm.     Pulses: Normal pulses.     Heart sounds: Murmur heard.  Pulmonary:     Effort: Pulmonary effort is normal.     Breath sounds: Normal breath sounds. No wheezing, rhonchi or rales.  Abdominal:     General: Abdomen is flat. Bowel sounds are normal. There is no distension.     Palpations: Abdomen is soft.     Tenderness: There is no abdominal tenderness.  Musculoskeletal:        General: No swelling or deformity. Normal range of motion.     Cervical back: Normal range of motion.  Skin:    General: Skin is warm and dry.     Capillary Refill: Capillary refill takes less than 2 seconds.  Neurological:      General: No focal deficit present.     Mental Status: He is alert and oriented to person, place, and time.     Motor: No weakness.  Psychiatric:        Mood and Affect: Mood normal.        Behavior: Behavior normal.        Thought Content: Thought content normal.   Last CBC Lab Results  Component Value Date   WBC 9.5 07/09/2023   HGB 15.0 07/09/2023   HCT 45.4 07/09/2023   MCV 94.2 07/09/2023   MCH 31.1 07/09/2023   RDW 12.9 07/09/2023   PLT 328 07/09/2023   Last metabolic panel Lab Results  Component Value Date   GLUCOSE 108 (H) 07/09/2023  NA 136 07/09/2023   K 3.8 07/09/2023   CL 105 07/09/2023   CO2 23 07/09/2023   BUN 16 07/09/2023   CREATININE 1.17 07/09/2023   GFRNONAA >60 07/09/2023   CALCIUM 8.8 (L) 07/09/2023   PROT 7.3 07/09/2023   ALBUMIN 4.0 07/09/2023   LABGLOB 2.6 02/04/2023   AGRATIO 1.5 07/24/2022   BILITOT 0.8 07/09/2023   ALKPHOS 63 07/09/2023   AST 36 07/09/2023   ALT 34 07/09/2023   ANIONGAP 8 07/09/2023   Last lipids Lab Results  Component Value Date   CHOL 152 07/24/2022   HDL 43 07/24/2022   LDLCALC 72 07/24/2022   TRIG 224 (H) 07/24/2022   CHOLHDL 3.5 07/24/2022   Last hemoglobin A1c Lab Results  Component Value Date   HGBA1C 6.3 (H) 02/04/2023   Last thyroid functions Lab Results  Component Value Date   TSH 2.160 02/04/2023   Last vitamin D Lab Results  Component Value Date   VD25OH 43.4 02/04/2023   Last vitamin B12 and Folate Lab Results  Component Value Date   VITAMINB12 559 07/24/2022   FOLATE 16.1 07/24/2022     Assessment & Plan:   Problem List Items Addressed This Visit       HTN (hypertension) - Primary   Remains adequately controlled on current antihypertensive regimen.  No medication changes are indicated today.      Cholelithiasis   As noted above, recent ER presentation for acute onset abdominal pain lasting 10-15 minutes.  Labs reassuring.  Imaging revealed cholelithiasis with equivocal findings  for cholecystitis.  Evaluated by general surgery, Dr. Lovell Sheehan, earlier this week.  Presenting symptoms were atypical for biliary colic.  Concerned that symptoms may be attributable to his current medications.  No recurrence of symptoms since late December.  I agree that symptoms may be related to medication side effects, though no obvious culprits are identified.  Glipizide has been discontinued today due to reports of symptomatic hypoglycemia.  Symptoms may ultimately be attributable to gas as he endorses significant symptom relief after taking Gas-X.  He will return to care with general surgery as needed if symptoms recur.      Type 2 diabetes mellitus with hyperlipidemia (HCC)   A1c 6.3 on labs from July.  He is currently prescribed glipizide 2.5 mg daily.  Today he describes multiple episodes of symptomatic hypoglycemia since his last appointment with his blood sugar decreasing below 70. -Repeat A1c ordered today -Discontinue glipizide in the setting of symptomatic hypoglycemia      Hyperlipidemia   We will panel updated in January 2024.  Total cholesterol 152 and LDL 72.  He is currently prescribed Repatha.  Repeat lipid panel ordered today.      Return in about 3 months (around 11/04/2023).   Billie Lade, MD

## 2023-08-06 NOTE — Assessment & Plan Note (Signed)
 Remains adequately controlled on current antihypertensive regimen.  No medication changes are indicated today.

## 2023-08-06 NOTE — Assessment & Plan Note (Addendum)
A1c 6.3 on labs from July.  He is currently prescribed glipizide 2.5 mg daily.  Today he describes multiple episodes of symptomatic hypoglycemia since his last appointment with his blood sugar decreasing below 70. -Repeat A1c ordered today -Discontinue glipizide in the setting of symptomatic hypoglycemia

## 2023-08-06 NOTE — Assessment & Plan Note (Signed)
As noted above, recent ER presentation for acute onset abdominal pain lasting 10-15 minutes.  Labs reassuring.  Imaging revealed cholelithiasis with equivocal findings for cholecystitis.  Evaluated by general surgery, Dr. Lovell Sheehan, earlier this week.  Presenting symptoms were atypical for biliary colic.  Concerned that symptoms may be attributable to his current medications.  No recurrence of symptoms since late December.  I agree that symptoms may be related to medication side effects, though no obvious culprits are identified.  Glipizide has been discontinued today due to reports of symptomatic hypoglycemia.  Symptoms may ultimately be attributable to gas as he endorses significant symptom relief after taking Gas-X.  He will return to care with general surgery as needed if symptoms recur.

## 2023-08-07 LAB — LIPID PANEL
Chol/HDL Ratio: 3.3 {ratio} (ref 0.0–5.0)
Cholesterol, Total: 156 mg/dL (ref 100–199)
HDL: 48 mg/dL (ref >39)
LDL Chol Calc (NIH): 72 mg/dL (ref 0–99)
Triglycerides: 219 mg/dL — ABNORMAL HIGH (ref 0–149)
VLDL Cholesterol Cal: 36 mg/dL (ref 5–40)

## 2023-08-07 LAB — VITAMIN D 25 HYDROXY (VIT D DEFICIENCY, FRACTURES): Vit D, 25-Hydroxy: 54.7 ng/mL (ref 30.0–100.0)

## 2023-08-07 LAB — B12 AND FOLATE PANEL
Folate: >20 ng/mL (ref >3.0)
Vitamin B-12: 556 pg/mL (ref 232–1245)

## 2023-08-07 LAB — HEMOGLOBIN A1C
Est. average glucose Bld gHb Est-mCnc: 128 mg/dL
Hgb A1c MFr Bld: 6.1 % — ABNORMAL HIGH (ref 4.8–5.6)

## 2023-08-07 LAB — TSH+FREE T4
Free T4: 1.36 ng/dL (ref 0.82–1.77)
TSH: 1.82 u[IU]/mL (ref 0.450–4.500)

## 2023-08-08 ENCOUNTER — Encounter: Payer: Self-pay | Admitting: Internal Medicine

## 2023-08-10 ENCOUNTER — Ambulatory Visit: Payer: Self-pay | Admitting: Internal Medicine

## 2023-08-10 NOTE — Telephone Encounter (Signed)
  Chief Complaint: Knee pain Symptoms: knee and calf pain goes down his leg. Numbness to the knee. Frequency: started this morning around 2:00 am Pertinent Negatives: Patient denies CP, SOB Disposition: [x] ED /[] Urgent Care (no appt availability in office) / [] Appointment(In office/virtual)/ []  [facility]/ [] Home Care/ [] Refused Recommended Disposition /[] [facility]/ []  Follow-up with PCP Additional Notes: patient c/o right knee pain and numbness. Patient endorses having had a knee replacement to this knee. Endorses knee and calf pain that goes down his leg. Patient is concerned for blood clots. Per protocol, recommendation is to the Emergency Department. Patient verbalizes understanding of plan and all questions answered.      Copied from CRM [id number]. Topic: Clinical - Red Word Triage >> Aug 10, 2023 10:22 AM Fuller Mandril wrote: Red Word that prompted transfer to Nurse Triage: Numb around knee going down leg. Lot of pain. Possible blood clots. Knee previously replaced. Reason for Disposition  Patient sounds very sick or weak to the triager  Answer Assessment - Initial Assessment Questions 1. LOCATION and RADIATION: "Where is the pain located?"      Right knee-goes down leg. Same knee that was replaced 2. QUALITY: "What does the pain feel like?"  (e.g., sharp, dull, aching, burning)     Aching and burning 3. SEVERITY: "How bad is the pain?" "What does it keep you from doing?"   (Scale 1-10; or mild, moderate, severe)   -  MILD (1-3): doesn't interfere with normal activities    -  MODERATE (4-7): interferes with normal activities (e.g., work or school) or awakens from sleep, limping    -  SEVERE (8-10): excruciating pain, unable to do any normal activities, unable to walk     6-7 out of 10 4. ONSET: "When did the pain start?" "Does it come and go, or is it there all the time?"     Started 2:00 this morning 5. RECURRENT: "Have you had this pain before?" If Yes, ask:  "When, and what happened then?"     No 6. SETTING: "Has there been any recent work, exercise or other activity that involved that part of the body?"      No 7. AGGRAVATING FACTORS: "What makes the knee pain worse?" (e.g., walking, climbing stairs, running)     unsure 8. ASSOCIATED SYMPTOMS: "Is there any swelling or redness of the knee?"     No 9. OTHER SYMPTOMS: "Do you have any other symptoms?" (e.g., chest pain, difficulty breathing, fever, calf pain)     Calf pain  Protocols used: Knee Pain-A-AH

## 2023-08-11 ENCOUNTER — Telehealth: Payer: Self-pay | Admitting: Internal Medicine

## 2023-08-11 NOTE — Telephone Encounter (Unsigned)
Copied from CRM [id number]. Topic: General - Other >> Aug 11, 2023 10:53 AM Desma Mcgregor wrote: Reason for CRM: [facility] checking the status of a surgical clearance that was initially sent 07/02/23 and again 2 weeks ago. Darel Hong just faxed it over again and got confirmation it was received. Patient is upset that this is taking over a month. Their fax# is [phone]. Please f/u [phone]

## 2023-08-11 NOTE — Telephone Encounter (Signed)
In provider folder

## 2023-08-11 NOTE — Telephone Encounter (Signed)
Surgery medical clearance (3rd attempt)  Noted Copied Scanned to s drive Dr Durwin Nora

## 2023-08-12 NOTE — Telephone Encounter (Signed)
Faxed

## 2023-08-16 NOTE — Telephone Encounter (Signed)
Copied from CRM [id number]. Topic: General - Other >> Aug 16, 2023 11:57 AM Dimitri Ped wrote: Reason for CRM: judy calling from  [facility] and didnt receive any lab work or office notes with clearance . Ms. Darel Hong is needing this information for the patient to have surgery . Trying to do it in a few weeks but got to have this first .  Call back number for ms judy [phone] fax number [phone]

## 2023-08-16 NOTE — Telephone Encounter (Signed)
CONFIRMATION that fax was sent to [facility]

## 2023-08-20 ENCOUNTER — Encounter: Payer: Self-pay | Admitting: Pharmacist

## 2023-09-02 DIAGNOSIS — H25813 Combined forms of age-related cataract, bilateral: Secondary | ICD-10-CM | POA: Diagnosis not present

## 2023-09-02 LAB — OPHTHALMOLOGY REPORT-SCANNED

## 2023-09-09 DIAGNOSIS — M1712 Unilateral primary osteoarthritis, left knee: Secondary | ICD-10-CM | POA: Diagnosis not present

## 2023-09-15 DIAGNOSIS — M1712 Unilateral primary osteoarthritis, left knee: Secondary | ICD-10-CM | POA: Diagnosis not present

## 2023-09-15 DIAGNOSIS — G8918 Other acute postprocedural pain: Secondary | ICD-10-CM | POA: Diagnosis not present

## 2023-09-15 DIAGNOSIS — Z96652 Presence of left artificial knee joint: Secondary | ICD-10-CM | POA: Diagnosis not present

## 2023-09-21 DIAGNOSIS — Z7985 Long-term (current) use of injectable non-insulin antidiabetic drugs: Secondary | ICD-10-CM | POA: Diagnosis not present

## 2023-09-21 DIAGNOSIS — Z471 Aftercare following joint replacement surgery: Secondary | ICD-10-CM | POA: Diagnosis not present

## 2023-09-21 DIAGNOSIS — I10 Essential (primary) hypertension: Secondary | ICD-10-CM | POA: Diagnosis not present

## 2023-09-21 DIAGNOSIS — E119 Type 2 diabetes mellitus without complications: Secondary | ICD-10-CM | POA: Diagnosis not present

## 2023-09-21 DIAGNOSIS — Z96653 Presence of artificial knee joint, bilateral: Secondary | ICD-10-CM | POA: Diagnosis not present

## 2023-09-21 DIAGNOSIS — Z791 Long term (current) use of non-steroidal anti-inflammatories (NSAID): Secondary | ICD-10-CM | POA: Diagnosis not present

## 2023-09-24 DIAGNOSIS — R2689 Other abnormalities of gait and mobility: Secondary | ICD-10-CM | POA: Diagnosis not present

## 2023-09-24 DIAGNOSIS — M62562 Muscle wasting and atrophy, not elsewhere classified, left lower leg: Secondary | ICD-10-CM | POA: Diagnosis not present

## 2023-09-24 DIAGNOSIS — M25562 Pain in left knee: Secondary | ICD-10-CM | POA: Diagnosis not present

## 2023-09-24 DIAGNOSIS — M25662 Stiffness of left knee, not elsewhere classified: Secondary | ICD-10-CM | POA: Diagnosis not present

## 2023-09-27 DIAGNOSIS — M25662 Stiffness of left knee, not elsewhere classified: Secondary | ICD-10-CM | POA: Diagnosis not present

## 2023-09-27 DIAGNOSIS — M62562 Muscle wasting and atrophy, not elsewhere classified, left lower leg: Secondary | ICD-10-CM | POA: Diagnosis not present

## 2023-09-27 DIAGNOSIS — R2689 Other abnormalities of gait and mobility: Secondary | ICD-10-CM | POA: Diagnosis not present

## 2023-09-27 DIAGNOSIS — M25562 Pain in left knee: Secondary | ICD-10-CM | POA: Diagnosis not present

## 2023-09-28 DIAGNOSIS — M1712 Unilateral primary osteoarthritis, left knee: Secondary | ICD-10-CM | POA: Diagnosis not present

## 2023-09-29 DIAGNOSIS — M25662 Stiffness of left knee, not elsewhere classified: Secondary | ICD-10-CM | POA: Diagnosis not present

## 2023-09-29 DIAGNOSIS — M25562 Pain in left knee: Secondary | ICD-10-CM | POA: Diagnosis not present

## 2023-09-29 DIAGNOSIS — R2689 Other abnormalities of gait and mobility: Secondary | ICD-10-CM | POA: Diagnosis not present

## 2023-09-29 DIAGNOSIS — M62562 Muscle wasting and atrophy, not elsewhere classified, left lower leg: Secondary | ICD-10-CM | POA: Diagnosis not present

## 2023-10-04 DIAGNOSIS — R2689 Other abnormalities of gait and mobility: Secondary | ICD-10-CM | POA: Diagnosis not present

## 2023-10-04 DIAGNOSIS — M25562 Pain in left knee: Secondary | ICD-10-CM | POA: Diagnosis not present

## 2023-10-04 DIAGNOSIS — M25662 Stiffness of left knee, not elsewhere classified: Secondary | ICD-10-CM | POA: Diagnosis not present

## 2023-10-04 DIAGNOSIS — M62562 Muscle wasting and atrophy, not elsewhere classified, left lower leg: Secondary | ICD-10-CM | POA: Diagnosis not present

## 2023-10-08 DIAGNOSIS — M25662 Stiffness of left knee, not elsewhere classified: Secondary | ICD-10-CM | POA: Diagnosis not present

## 2023-10-08 DIAGNOSIS — R2689 Other abnormalities of gait and mobility: Secondary | ICD-10-CM | POA: Diagnosis not present

## 2023-10-08 DIAGNOSIS — M62562 Muscle wasting and atrophy, not elsewhere classified, left lower leg: Secondary | ICD-10-CM | POA: Diagnosis not present

## 2023-10-08 DIAGNOSIS — M25562 Pain in left knee: Secondary | ICD-10-CM | POA: Diagnosis not present

## 2023-10-11 DIAGNOSIS — R2689 Other abnormalities of gait and mobility: Secondary | ICD-10-CM | POA: Diagnosis not present

## 2023-10-11 DIAGNOSIS — M25662 Stiffness of left knee, not elsewhere classified: Secondary | ICD-10-CM | POA: Diagnosis not present

## 2023-10-11 DIAGNOSIS — M25562 Pain in left knee: Secondary | ICD-10-CM | POA: Diagnosis not present

## 2023-10-11 DIAGNOSIS — M62562 Muscle wasting and atrophy, not elsewhere classified, left lower leg: Secondary | ICD-10-CM | POA: Diagnosis not present

## 2023-10-15 DIAGNOSIS — M25562 Pain in left knee: Secondary | ICD-10-CM | POA: Diagnosis not present

## 2023-10-15 DIAGNOSIS — M25662 Stiffness of left knee, not elsewhere classified: Secondary | ICD-10-CM | POA: Diagnosis not present

## 2023-10-15 DIAGNOSIS — R2689 Other abnormalities of gait and mobility: Secondary | ICD-10-CM | POA: Diagnosis not present

## 2023-10-15 DIAGNOSIS — M62562 Muscle wasting and atrophy, not elsewhere classified, left lower leg: Secondary | ICD-10-CM | POA: Diagnosis not present

## 2023-10-18 DIAGNOSIS — R2689 Other abnormalities of gait and mobility: Secondary | ICD-10-CM | POA: Diagnosis not present

## 2023-10-18 DIAGNOSIS — M62562 Muscle wasting and atrophy, not elsewhere classified, left lower leg: Secondary | ICD-10-CM | POA: Diagnosis not present

## 2023-10-18 DIAGNOSIS — M25662 Stiffness of left knee, not elsewhere classified: Secondary | ICD-10-CM | POA: Diagnosis not present

## 2023-10-18 DIAGNOSIS — M25562 Pain in left knee: Secondary | ICD-10-CM | POA: Diagnosis not present

## 2023-10-22 DIAGNOSIS — R2689 Other abnormalities of gait and mobility: Secondary | ICD-10-CM | POA: Diagnosis not present

## 2023-10-22 DIAGNOSIS — M25562 Pain in left knee: Secondary | ICD-10-CM | POA: Diagnosis not present

## 2023-10-22 DIAGNOSIS — M62562 Muscle wasting and atrophy, not elsewhere classified, left lower leg: Secondary | ICD-10-CM | POA: Diagnosis not present

## 2023-10-22 DIAGNOSIS — M25662 Stiffness of left knee, not elsewhere classified: Secondary | ICD-10-CM | POA: Diagnosis not present

## 2023-10-26 ENCOUNTER — Ambulatory Visit: Payer: Medicare HMO | Admitting: Podiatry

## 2023-10-27 DIAGNOSIS — M62562 Muscle wasting and atrophy, not elsewhere classified, left lower leg: Secondary | ICD-10-CM | POA: Diagnosis not present

## 2023-10-27 DIAGNOSIS — M25662 Stiffness of left knee, not elsewhere classified: Secondary | ICD-10-CM | POA: Diagnosis not present

## 2023-10-27 DIAGNOSIS — M25562 Pain in left knee: Secondary | ICD-10-CM | POA: Diagnosis not present

## 2023-10-27 DIAGNOSIS — R2689 Other abnormalities of gait and mobility: Secondary | ICD-10-CM | POA: Diagnosis not present

## 2023-10-29 DIAGNOSIS — M25662 Stiffness of left knee, not elsewhere classified: Secondary | ICD-10-CM | POA: Diagnosis not present

## 2023-10-29 DIAGNOSIS — M62562 Muscle wasting and atrophy, not elsewhere classified, left lower leg: Secondary | ICD-10-CM | POA: Diagnosis not present

## 2023-10-29 DIAGNOSIS — R2689 Other abnormalities of gait and mobility: Secondary | ICD-10-CM | POA: Diagnosis not present

## 2023-10-29 DIAGNOSIS — M25562 Pain in left knee: Secondary | ICD-10-CM | POA: Diagnosis not present

## 2023-11-02 DIAGNOSIS — H16223 Keratoconjunctivitis sicca, not specified as Sjogren's, bilateral: Secondary | ICD-10-CM | POA: Diagnosis not present

## 2023-11-02 DIAGNOSIS — H2513 Age-related nuclear cataract, bilateral: Secondary | ICD-10-CM | POA: Diagnosis not present

## 2023-11-03 DIAGNOSIS — R2689 Other abnormalities of gait and mobility: Secondary | ICD-10-CM | POA: Diagnosis not present

## 2023-11-03 DIAGNOSIS — M25662 Stiffness of left knee, not elsewhere classified: Secondary | ICD-10-CM | POA: Diagnosis not present

## 2023-11-03 DIAGNOSIS — M25562 Pain in left knee: Secondary | ICD-10-CM | POA: Diagnosis not present

## 2023-11-03 DIAGNOSIS — M62562 Muscle wasting and atrophy, not elsewhere classified, left lower leg: Secondary | ICD-10-CM | POA: Diagnosis not present

## 2023-11-09 ENCOUNTER — Telehealth: Payer: Self-pay | Admitting: Cardiovascular Disease

## 2023-11-09 MED ORDER — REPATHA SURECLICK 140 MG/ML ~~LOC~~ SOAJ: SUBCUTANEOUS | 3 refills | Status: AC

## 2023-11-09 NOTE — Telephone Encounter (Signed)
*  STAT* If patient is at the pharmacy, call can be transferred to refill team.   1. Which medications need to be refilled? (please list name of each medication and dose if known) Evolocumab (REPATHA SURECLICK) 644 MG/ML SOAJ   2. Which pharmacy/location (including street and city if local pharmacy) is medication to be sent to?  [place], [place] [address]    3. Do they need a 30 day or 90 day supply? [place]

## 2023-11-22 ENCOUNTER — Other Ambulatory Visit: Payer: Self-pay

## 2023-11-22 ENCOUNTER — Encounter (HOSPITAL_COMMUNITY): Payer: Self-pay

## 2023-11-22 ENCOUNTER — Encounter (HOSPITAL_COMMUNITY)
Admission: RE | Admit: 2023-11-22 | Discharge: 2023-11-22 | Disposition: A | Discharge status: Home / Self Care | Source: Ambulatory Visit | Attending: Optometry | Admitting: Optometry

## 2023-11-23 DIAGNOSIS — H2511 Age-related nuclear cataract, right eye: Secondary | ICD-10-CM | POA: Diagnosis not present

## 2023-11-23 NOTE — H&P (Signed)
 Surgical History & Physical  Patient Name: Douglas Keller  DOB: 1940-11-02  Surgery: Cataract extraction with intraocular lens implant phacoemulsification; Right Eye Surgeon: Ardeth Krabbe MD Surgery Date: 11/26/2023 Pre-Op Date: 11/02/2023  HPI: A 77 Yr. old male patient present for cataract eval per Dr. Barbra Boone. 1. The patient complains of difficulty when viewing TV, reading closed caption, news scrolls on TV, having glare on bright sunny days, reading fine print which began for an unknown amount of time. Both eyes are affected, OS>OD. The episode is constant. The condition 's severity is worsening. This is negatively affecting the patient 's quality of life and the patient is unable to function adequately in life with the current level of vision. OD waters a lot of the time.  Medical History: Cataracts  Arthritis High Blood Pressure LDL  Review of Systems Cardiovascular High Blood Pressure Musculoskeletal arthritis pains All recorded systems are negative except as noted above.  Social Never smoked   Medication Celecoxib, Spironolacton-hydrochlorothiaz, Benazepril , Amoxicillin, Amlodipine , Tizanidine, Amiodarone   Sx/Procedures Knee Replacement - bilatera, Back surgery  Drug Allergies  Metformin , Statins-HMG-CoA Reductase Inhibitors  History & Physical: Heent: cataracts NECK: supple without bruits LUNGS: lungs clear to auscultation CV: regular rate and rhythm Abdomen: soft and non-tender  Impression & Plan: Assessment: 1.  CATARACT NUCLEAR SCLEROSIS AGE RELATED; Both Eyes (H25.13) 2.  KERATOCONJUNCTIVITIS SICCA NOT SPECIFIED AS SJORGRENS; Both Eyes (H16.223) 3.  Pinguecula; Both Eyes (H11.153)  Plan: 1.  Cataracts are visually significant and account for the patient's complaints. Discussed all risks, benefits, procedures and recovery, including infection, loss of vision and eye, need for glasses after surgery or additional procedures. Patient understands changing  glasses will not improve vision. Patient indicated understanding of procedure. All questions answered. Patient desires to have surgery, recommend phacoemulsification with intraocular lens. Patient to have preliminary testing necessary ([employer]os/IOL Master, Mac OCT, TOPO) Educational materials provided:Cataract.  Plan: - Proceed with cataract surgery OD followed by OS - Plan for best distance target with DIB00 - No DM, no fuchs, no prior eye surgery - good dilation - dextenza   2.  Dry eye. Mild signs of dry eye at this time. Can use artificial tears QID OU PRN and warm compresses once daily as needed.  3.  Artificial tears

## 2023-11-26 ENCOUNTER — Ambulatory Visit (HOSPITAL_COMMUNITY)
Admission: RE | Admit: 2023-11-26 | Discharge: 2023-11-26 | Disposition: A | Discharge status: Home / Self Care | Attending: Optometry | Admitting: Optometry

## 2023-11-26 ENCOUNTER — Encounter (HOSPITAL_COMMUNITY)
Admission: RE | Disposition: A | Discharge status: Home / Self Care | Payer: Self-pay | Source: Home / Self Care | Attending: Optometry

## 2023-11-26 ENCOUNTER — Ambulatory Visit (HOSPITAL_COMMUNITY): Admitting: Anesthesiology

## 2023-11-26 DIAGNOSIS — I10 Essential (primary) hypertension: Secondary | ICD-10-CM | POA: Insufficient documentation

## 2023-11-26 DIAGNOSIS — I2510 Atherosclerotic heart disease of native coronary artery without angina pectoris: Secondary | ICD-10-CM

## 2023-11-26 DIAGNOSIS — H2513 Age-related nuclear cataract, bilateral: Secondary | ICD-10-CM | POA: Insufficient documentation

## 2023-11-26 DIAGNOSIS — H16223 Keratoconjunctivitis sicca, not specified as Sjogren's, bilateral: Secondary | ICD-10-CM | POA: Diagnosis not present

## 2023-11-26 DIAGNOSIS — G4730 Sleep apnea, unspecified: Secondary | ICD-10-CM | POA: Diagnosis not present

## 2023-11-26 DIAGNOSIS — H2511 Age-related nuclear cataract, right eye: Secondary | ICD-10-CM | POA: Diagnosis not present

## 2023-11-26 DIAGNOSIS — F32A Depression, unspecified: Secondary | ICD-10-CM

## 2023-11-26 DIAGNOSIS — E1136 Type 2 diabetes mellitus with diabetic cataract: Secondary | ICD-10-CM | POA: Insufficient documentation

## 2023-11-26 DIAGNOSIS — H5711 Ocular pain, right eye: Secondary | ICD-10-CM | POA: Diagnosis not present

## 2023-11-26 DIAGNOSIS — I38 Endocarditis, valve unspecified: Secondary | ICD-10-CM | POA: Insufficient documentation

## 2023-11-26 HISTORY — PX: INSERTION, STENT, DRUG-ELUTING, LACRIMAL CANALICULUS: SHX7453

## 2023-11-26 HISTORY — PX: CATARACT EXTRACTION W/PHACO: SHX586

## 2023-11-26 LAB — GLUCOSE, CAPILLARY: Glucose-Capillary: 107 mg/dL — ABNORMAL HIGH (ref 70–99)

## 2023-11-26 SURGERY — PHACOEMULSIFICATION, CATARACT, WITH IOL INSERTION
Anesthesia: Monitor Anesthesia Care | Site: Eye | Laterality: Right

## 2023-11-26 MED ORDER — SIGHTPATH DOSE#1 NA HYALUR & NA CHOND-NA HYALUR IO KIT
PACK | INTRAOCULAR | Status: DC | PRN
  Administered 2023-11-26: 1 via OPHTHALMIC

## 2023-11-26 MED ORDER — TETRACAINE HCL 0.5 % OP SOLN
1.0000 [drp] | OPHTHALMIC | Status: AC
  Administered 2023-11-26 (×3): 1 [drp] via OPHTHALMIC

## 2023-11-26 MED ORDER — POVIDONE-IODINE 5 % OP SOLN
OPHTHALMIC | Status: DC | PRN
  Administered 2023-11-26: 1 via OPHTHALMIC

## 2023-11-26 MED ORDER — STERILE WATER FOR IRRIGATION IR SOLN
Status: DC | PRN
  Administered 2023-11-26: 250 mL

## 2023-11-26 MED ORDER — DEXAMETHASONE 0.4 MG OP INST
VAGINAL_INSERT | OPHTHALMIC | Status: AC
  Filled 2023-11-26: qty 1

## 2023-11-26 MED ORDER — DEXAMETHASONE 0.4 MG OP INST
VAGINAL_INSERT | OPHTHALMIC | Status: DC | PRN
  Administered 2023-11-26: .4 mg via OPHTHALMIC

## 2023-11-26 MED ORDER — PHENYLEPHRINE HCL 2.5 % OP SOLN
1.0000 [drp] | OPHTHALMIC | Status: AC
  Administered 2023-11-26 (×3): 1 [drp] via OPHTHALMIC

## 2023-11-26 MED ORDER — MOXIFLOXACIN HCL 5 MG/ML IO SOLN
INTRAOCULAR | Status: DC | PRN
  Administered 2023-11-26: .2 mL via INTRACAMERAL

## 2023-11-26 MED ORDER — PHENYLEPHRINE-KETOROLAC 1-0.3 % IO SOLN
INTRAOCULAR | Status: DC | PRN
  Administered 2023-11-26: 500 mL via OPHTHALMIC

## 2023-11-26 MED ORDER — LIDOCAINE HCL 3.5 % OP GEL
1.0000 | Freq: Once | OPHTHALMIC | Status: AC
  Administered 2023-11-26: 1 via OPHTHALMIC

## 2023-11-26 MED ORDER — TROPICAMIDE 1 % OP SOLN
1.0000 [drp] | OPHTHALMIC | Status: AC
  Administered 2023-11-26 (×3): 1 [drp] via OPHTHALMIC

## 2023-11-26 MED ORDER — LIDOCAINE HCL (PF) 1 % IJ SOLN
INTRAMUSCULAR | Status: DC | PRN
  Administered 2023-11-26: 2 mL

## 2023-11-26 MED ORDER — BSS IO SOLN
INTRAOCULAR | Status: DC | PRN
  Administered 2023-11-26: 15 mL via INTRAOCULAR

## 2023-11-26 MED ORDER — BSS IO SOLN
INTRAOCULAR | Status: DC | PRN
  Administered 2023-11-26: 500 mL via INTRAOCULAR

## 2023-11-26 SURGICAL SUPPLY — 16 items
CATARACT SUITE SIGHTPATH (MISCELLANEOUS) ×1 IMPLANT
CLOTH BEACON ORANGE TIMEOUT ST (SAFETY) ×1 IMPLANT
DRSG TEGADERM 4X4.75 (GAUZE/BANDAGES/DRESSINGS) ×1 IMPLANT
EYE SHIELD UNIVERSAL CLEAR (GAUZE/BANDAGES/DRESSINGS) IMPLANT
FEE CATARACT SUITE SIGHTPATH (MISCELLANEOUS) ×1 IMPLANT
GAUZE SPONGE 4X4 12PLY STRL (GAUZE/BANDAGES/DRESSINGS) IMPLANT
GLOVE BIOGEL PI IND STRL 7.0 (GLOVE) ×2 IMPLANT
LENS IOL TECNIS EYHANCE 18.5 (Intraocular Lens) IMPLANT
NDL HYPO 18GX1.5 BLUNT FILL (NEEDLE) ×1 IMPLANT
NEEDLE HYPO 18GX1.5 BLUNT FILL (NEEDLE) ×1 IMPLANT
PAD ARMBOARD POSITIONER FOAM (MISCELLANEOUS) ×1 IMPLANT
POSITIONER HEAD 8X9X4 ADT (SOFTGOODS) ×1 IMPLANT
SOLUTION BSS 500 (MISCELLANEOUS) IMPLANT
SYR TB 1ML LL NO SAFETY (SYRINGE) ×1 IMPLANT
TAPE SURG TRANSPORE 1 IN (GAUZE/BANDAGES/DRESSINGS) IMPLANT
WATER STERILE IRR 250ML POUR (IV SOLUTION) ×1 IMPLANT

## 2023-11-26 NOTE — Transfer of Care (Signed)
 Immediate Anesthesia Transfer of Care Note  Patient: Douglas Keller.  Procedure(s) Performed: PHACOEMULSIFICATION, CATARACT, WITH IOL INSERTION (Right: Eye) INSERTION, STENT, DRUG-ELUTING, LACRIMAL CANALICULUS (Right: Eye)  Patient Location: Short Stay  Anesthesia Type:MAC  Level of Consciousness: awake, alert , and oriented  Airway & Oxygen  Therapy: Patient Spontanous Breathing  Post-op Assessment: Report given to RN and Post -op Vital signs reviewed and stable  Post vital signs: Reviewed and stable  Last Vitals:  Vitals Value Taken Time  BP/ 174/81   Temp 36.5 C 11/26/23 0908  Pulse 59 11/26/23 0908  Resp 17 11/26/23 0908  SpO2 99 % 11/26/23 0908    Last Pain:  Vitals:   11/26/23 0908  TempSrc: Oral  PainSc:          Complications: No notable events documented.

## 2023-11-26 NOTE — Op Note (Signed)
 Date of procedure: 11/26/23  Pre-operative diagnosis: Visually significant age-related nuclear cataract, Right Eye (H25.11)  Post-operative diagnosis: Visually significant age-related nuclear cataract, Right Eye; Ocular Pain and Inflammation, Right eye H57.11  Procedure: Removal of cataract via phacoemulsification and insertion of intra-ocular lens J&J DIBOO +18.5D into the capsular bag of the Right Eye  Attending surgeon: Ardeth Krabbe, MD  Anesthesia: MAC, Topical Akten  Complications: None  Estimated Blood Loss: <40mL (minimal)  Specimens: None  Implants:  Implant Name Type Inv. Item Serial No. Manufacturer Lot No. LRB No. Used Action  LENS IOL TECNIS EYHANCE 18.5 - [id number] Intraocular Lens LENS IOL TECNIS EYHANCE 18.5 [id number] [employer]  Right 1 Implanted    Indications:  Visually significant age-related cataract, Right Eye  Procedure:  The patient was seen and identified in the pre-operative area. The operative eye was identified and dilated.  The operative eye was marked.  Topical anesthesia was administered to the operative eye.     The patient was then to the operative suite and placed in the supine position.  A timeout was performed confirming the patient, procedure to be performed, and all other relevant information.   The patient's face was prepped and draped in the usual fashion for intra-ocular surgery.  A lid speculum was placed into the operative eye and the surgical microscope moved into place and focused.  A superotemporal paracentesis was created using a 20 gauge paracentesis blade.  BSS mixed with Omidria, followed by 1% lidocaine  was injected into the anterior chamber.  Viscoelastic was injected into the anterior chamber.  A temporal clear-corneal main wound incision was created using a 2.68mm microkeratome.  A continuous curvilinear capsulorrhexis was initiated using an irrigating cystitome and completed using capsulorrhexis forceps.  Hydrodissection and  hydrodeliniation were performed.  Viscoelastic was injected into the anterior chamber.  A phacoemulsification handpiece and a chopper as a second instrument were used to remove the nucleus and epinucleus. The irrigation/aspiration handpiece was used to remove any remaining cortical material.   The capsular bag was reinflated with viscoelastic, checked, and found to be intact.  The intraocular lens was inserted into the capsular bag.  The irrigation/aspiration handpiece was used to remove any remaining viscoelastic.  The clear corneal wound and paracentesis wounds were then hydrated and checked with Weck-Cels to be watertight. Moxifloxacin was instilled into the anterior chamber.  The lid-speculum and drape were removed. The lower punctum was dilated, and the dextenza  implant was inserted into it. The patient's face was cleaned with a wet and dry 4x4. A clear shield was taped over the eye. The patient was taken to the post-operative care unit in good condition, having tolerated the procedure well.  Post-Op Instructions: The patient will follow up at [facility] for a same day post-operative evaluation and will receive all other orders and instructions.

## 2023-11-26 NOTE — Interval H&P Note (Signed)
 History and Physical Interval Note:  11/26/2023 7:59 AM  The H and P was reviewed and updated. The patient was examined.  No changes were found after exam.  The surgical eye was marked.  Douglas Keller

## 2023-11-26 NOTE — Discharge Instructions (Signed)
 Please discharge patient when stable, will follow up today with Dr. Ilsa Iha at the [facility] office immediately following discharge.  Leave shield in place until visit.  All paperwork with discharge instructions will be given at the office.  [facility] Address:  [address]  [place], [place] [address]  Dr. Chaya Jan Phone: [phone]

## 2023-11-26 NOTE — Anesthesia Preprocedure Evaluation (Signed)
 Anesthesia Evaluation  Patient identified by MRN, date of birth, ID band Patient awake    Reviewed: Allergy & Precautions, H&P , NPO status , Patient's Chart, lab work & pertinent test results, reviewed documented beta blocker date and time   Airway Mallampati: II  TM Distance: >3 FB Neck ROM: full    Dental no notable dental hx.    Pulmonary sleep apnea    Pulmonary exam normal breath sounds clear to auscultation       Cardiovascular Exercise Tolerance: Good hypertension, + CAD  + Valvular Problems/Murmurs  Rhythm:regular Rate:Normal     Neuro/Psych  PSYCHIATRIC DISORDERS  Depression    negative neurological ROS     GI/Hepatic Neg liver ROS,GERD  ,,  Endo/Other  diabetes    Renal/GU negative Renal ROS  negative genitourinary   Musculoskeletal   Abdominal   Peds  Hematology negative hematology ROS (+)   Anesthesia Other Findings   Reproductive/Obstetrics negative OB ROS                             Anesthesia Physical Anesthesia Plan  ASA: 3  Anesthesia Plan: MAC   Post-op Pain Management:    Induction:   PONV Risk Score and Plan:   Airway Management Planned:   Additional Equipment:   Intra-op Plan:   Post-operative Plan:   Informed Consent: I have reviewed the patients History and Physical, chart, labs and discussed the procedure including the risks, benefits and alternatives for the proposed anesthesia with the patient or authorized representative who has indicated his/her understanding and acceptance.     Dental Advisory Given  Plan Discussed with: CRNA  Anesthesia Plan Comments:        Anesthesia Quick Evaluation

## 2023-11-29 ENCOUNTER — Encounter (HOSPITAL_COMMUNITY): Payer: Self-pay | Admitting: Optometry

## 2023-11-29 NOTE — Anesthesia Postprocedure Evaluation (Signed)
 Anesthesia Post Note  Patient: Douglas Keller.  Procedure(s) Performed: PHACOEMULSIFICATION, CATARACT, WITH IOL INSERTION (Right: Eye) INSERTION, STENT, DRUG-ELUTING, LACRIMAL CANALICULUS (Right: Eye)  Patient location during evaluation: Phase II Anesthesia Type: MAC Level of consciousness: awake Pain management: pain level controlled Vital Signs Assessment: post-procedure vital signs reviewed and stable Respiratory status: spontaneous breathing and respiratory function stable Cardiovascular status: blood pressure returned to baseline and stable Postop Assessment: no headache and no apparent nausea or vomiting Anesthetic complications: no Comments: Late entry   No notable events documented.   Last Vitals:  Vitals:   11/26/23 0908 11/26/23 0909  BP:  (!) 174/81  Pulse: (!) 59   Resp: 17   Temp: 36.5 C   SpO2: 99%     Last Pain:  Vitals:   11/26/23 0908  TempSrc: Oral  PainSc:                  Coretha Dew

## 2023-12-02 NOTE — H&P (Signed)
 Surgical History & Physical  Patient Name: Douglas Keller  DOB: 1941-01-08  Surgery: Cataract extraction with intraocular lens implant phacoemulsification; Left Eye Surgeon: Ardeth Krabbe MD Surgery Date: 12/10/2023 Pre-Op Date: 12/01/2023  HPI: A 89 Yr. old male patient 1. The patient is returning after cataract surgery. The right eye is affected. Status post cataract surgery, which began 5 days ago: Since the last visit, the affected area feels improvement. The patient's vision is improved. The condition's severity is constant. Patient is following medication instructions. 2. The patient is returning for a cataract follow-up of the left eye. Since the last visit, the affected area is tolerating. The patient experiences improved diplopia. The condition's severity is constant. Patient is not taking medications. This is negatively affecting the patient's quality of life and the patient is unable to function adequately in life with the current level of vision.  Medical History: Cataracts  Arthritis High Blood Pressure LDL  Review of Systems Cardiovascular High Blood Pressure Musculoskeletal arthritis pains All recorded systems are negative except as noted above.  Social Never smoked   Medication Prednisolone-moxiflox-bromfen,  Celecoxib, Spironolacton-hydrochlorothiaz, Benazepril , Amoxicillin, Amlodipine ,Tizanidine, Amiodarone   Sx/Procedures Phaco c IOL OD- dextenza ,  Knee Replacement - bilatera, Back surgery  Drug Allergies  Metformin , Statins-HMG-CoA Reductase Inhibitors,   History & Physical: Heent: catararct NECK: supple without bruits LUNGS: lungs clear to auscultation CV: regular rate and rhythm Abdomen: soft and non-tender  Impression & Plan: Assessment: 1.  CATARACT NUCLEAR SCLEROSIS AGE RELATED; Both Eyes (H25.13) 2.  CATARACT EXTRACTION STATUS; Right Eye (Z98.41) 3.  INTRAOCULAR LENS IOL (Z96.1)  Plan: 1.  Cataracts are visually significant and account for  the patient's complaints. Discussed all risks, benefits, procedures and recovery, including infection, loss of vision and eye, need for glasses after surgery or additional procedures. Patient understands changing glasses will not improve vision. Patient indicated understanding of procedure. All questions answered. Patient desires to have surgery, recommend phacoemulsification with intraocular lens. Patient to have preliminary testing necessary ([facility]/IOL Master, Mac OCT, TOPO) Educational materials provided:Cataract.  Plan: - Proceed with cataract surgery OS when ready - Plan for best distance target with DIB00 - No DM, no fuchs, no prior eye surgery - good dilation - dextenza   2.  POD5. Doing well. All post-op precautions discussed and instructions reviewed. Written instructions given  3. See above

## 2023-12-07 DIAGNOSIS — H2512 Age-related nuclear cataract, left eye: Secondary | ICD-10-CM | POA: Diagnosis not present

## 2023-12-08 ENCOUNTER — Encounter (HOSPITAL_COMMUNITY): Payer: Self-pay

## 2023-12-08 ENCOUNTER — Encounter (HOSPITAL_COMMUNITY)
Admission: RE | Admit: 2023-12-08 | Discharge: 2023-12-08 | Disposition: A | Discharge status: Home / Self Care | Source: Ambulatory Visit | Attending: Optometry | Admitting: Optometry

## 2023-12-08 ENCOUNTER — Other Ambulatory Visit: Payer: Self-pay

## 2023-12-10 ENCOUNTER — Ambulatory Visit (HOSPITAL_COMMUNITY): Admitting: Certified Registered"

## 2023-12-10 ENCOUNTER — Other Ambulatory Visit: Payer: Self-pay

## 2023-12-10 ENCOUNTER — Ambulatory Visit (HOSPITAL_COMMUNITY)
Admission: RE | Admit: 2023-12-10 | Discharge: 2023-12-10 | Disposition: A | Discharge status: Home / Self Care | Source: Ambulatory Visit | Attending: Optometry | Admitting: Optometry

## 2023-12-10 ENCOUNTER — Encounter (HOSPITAL_COMMUNITY): Payer: Self-pay | Admitting: Optometry

## 2023-12-10 ENCOUNTER — Encounter (HOSPITAL_COMMUNITY)
Admission: RE | Disposition: A | Discharge status: Home / Self Care | Payer: Self-pay | Source: Ambulatory Visit | Attending: Optometry

## 2023-12-10 DIAGNOSIS — E1136 Type 2 diabetes mellitus with diabetic cataract: Secondary | ICD-10-CM | POA: Insufficient documentation

## 2023-12-10 DIAGNOSIS — I2510 Atherosclerotic heart disease of native coronary artery without angina pectoris: Secondary | ICD-10-CM

## 2023-12-10 DIAGNOSIS — I10 Essential (primary) hypertension: Secondary | ICD-10-CM

## 2023-12-10 DIAGNOSIS — H2512 Age-related nuclear cataract, left eye: Secondary | ICD-10-CM | POA: Insufficient documentation

## 2023-12-10 DIAGNOSIS — H5712 Ocular pain, left eye: Secondary | ICD-10-CM | POA: Insufficient documentation

## 2023-12-10 DIAGNOSIS — K219 Gastro-esophageal reflux disease without esophagitis: Secondary | ICD-10-CM | POA: Insufficient documentation

## 2023-12-10 DIAGNOSIS — G4730 Sleep apnea, unspecified: Secondary | ICD-10-CM | POA: Diagnosis not present

## 2023-12-10 DIAGNOSIS — E785 Hyperlipidemia, unspecified: Secondary | ICD-10-CM

## 2023-12-10 HISTORY — PX: CATARACT EXTRACTION W/PHACO: SHX586

## 2023-12-10 HISTORY — PX: INSERTION, STENT, DRUG-ELUTING, LACRIMAL CANALICULUS: SHX7453

## 2023-12-10 LAB — GLUCOSE, CAPILLARY: Glucose-Capillary: 112 mg/dL — ABNORMAL HIGH (ref 70–99)

## 2023-12-10 SURGERY — PHACOEMULSIFICATION, CATARACT, WITH IOL INSERTION
Anesthesia: Monitor Anesthesia Care | Site: Eye | Laterality: Left

## 2023-12-10 MED ORDER — MIDAZOLAM HCL 2 MG/2ML IJ SOLN
INTRAMUSCULAR | Status: AC
  Filled 2023-12-10: qty 2

## 2023-12-10 MED ORDER — DEXAMETHASONE 0.4 MG OP INST
VAGINAL_INSERT | OPHTHALMIC | Status: AC
  Filled 2023-12-10: qty 1

## 2023-12-10 MED ORDER — TETRACAINE HCL 0.5 % OP SOLN
1.0000 [drp] | OPHTHALMIC | Status: AC | PRN
  Administered 2023-12-10 (×3): 1 [drp] via OPHTHALMIC

## 2023-12-10 MED ORDER — LACTATED RINGERS IV SOLN: INTRAVENOUS | Status: DC

## 2023-12-10 MED ORDER — SODIUM CHLORIDE 0.9% FLUSH
3.0000 mL | Freq: Two times a day (BID) | INTRAVENOUS | Status: DC
  Administered 2023-12-10: 10 mL via INTRAVENOUS

## 2023-12-10 MED ORDER — POVIDONE-IODINE 5 % OP SOLN
OPHTHALMIC | Status: DC | PRN
  Administered 2023-12-10: 1 via OPHTHALMIC

## 2023-12-10 MED ORDER — SODIUM CHLORIDE 0.9% FLUSH: 3.0000 mL | INTRAVENOUS | Status: DC | PRN

## 2023-12-10 MED ORDER — TETRACAINE 0.5 % OP SOLN OPTIME - NO CHARGE
OPHTHALMIC | Status: DC | PRN
  Administered 2023-12-10: 1 [drp] via OPHTHALMIC

## 2023-12-10 MED ORDER — TROPICAMIDE 1 % OP SOLN
1.0000 [drp] | OPHTHALMIC | Status: AC | PRN
  Administered 2023-12-10 (×3): 1 [drp] via OPHTHALMIC

## 2023-12-10 MED ORDER — DEXAMETHASONE 0.4 MG OP INST
VAGINAL_INSERT | OPHTHALMIC | Status: DC | PRN
  Administered 2023-12-10: .4 mg via OPHTHALMIC

## 2023-12-10 MED ORDER — SIGHTPATH DOSE#1 NA HYALUR & NA CHOND-NA HYALUR IO KIT
PACK | INTRAOCULAR | Status: DC | PRN
  Administered 2023-12-10: 1 via OPHTHALMIC

## 2023-12-10 MED ORDER — LIDOCAINE HCL (PF) 1 % IJ SOLN
INTRAMUSCULAR | Status: DC | PRN
  Administered 2023-12-10: 1 mL

## 2023-12-10 MED ORDER — STERILE WATER FOR IRRIGATION IR SOLN
Status: DC | PRN
  Administered 2023-12-10: 250 mL

## 2023-12-10 MED ORDER — BSS IO SOLN
INTRAOCULAR | Status: DC | PRN
  Administered 2023-12-10: 15 mL via INTRAOCULAR

## 2023-12-10 MED ORDER — PHENYLEPHRINE-KETOROLAC 1-0.3 % IO SOLN
INTRAOCULAR | Status: DC | PRN
  Administered 2023-12-10: 500 mL via OPHTHALMIC

## 2023-12-10 MED ORDER — PHENYLEPHRINE HCL 2.5 % OP SOLN
1.0000 [drp] | OPHTHALMIC | Status: AC | PRN
  Administered 2023-12-10 (×3): 1 [drp] via OPHTHALMIC

## 2023-12-10 MED ORDER — MOXIFLOXACIN HCL 5 MG/ML IO SOLN
INTRAOCULAR | Status: DC | PRN
  Administered 2023-12-10: .3 mL via OPHTHALMIC

## 2023-12-10 MED ORDER — LIDOCAINE HCL 3.5 % OP GEL
1.0000 | Freq: Once | OPHTHALMIC | Status: AC
  Administered 2023-12-10: 1 via OPHTHALMIC

## 2023-12-10 SURGICAL SUPPLY — 14 items
CATARACT SUITE SIGHTPATH (MISCELLANEOUS) ×2 IMPLANT
CLOTH BEACON ORANGE TIMEOUT ST (SAFETY) ×2 IMPLANT
DRSG TEGADERM 4X4.75 (GAUZE/BANDAGES/DRESSINGS) ×2 IMPLANT
EYE SHIELD UNIVERSAL CLEAR (GAUZE/BANDAGES/DRESSINGS) IMPLANT
FEE CATARACT SUITE SIGHTPATH (MISCELLANEOUS) ×2 IMPLANT
GLOVE BIOGEL PI IND STRL 7.0 (GLOVE) ×4 IMPLANT
LENS IOL TECNIS EYHANCE 20.0 (Intraocular Lens) IMPLANT
NDL HYPO 18GX1.5 BLUNT FILL (NEEDLE) ×2 IMPLANT
NEEDLE HYPO 18GX1.5 BLUNT FILL (NEEDLE) ×2 IMPLANT
PAD ARMBOARD POSITIONER FOAM (MISCELLANEOUS) ×2 IMPLANT
POSITIONER HEAD 8X9X4 ADT (SOFTGOODS) ×2 IMPLANT
SYR TB 1ML LL NO SAFETY (SYRINGE) ×2 IMPLANT
TAPE SURG TRANSPORE 1 IN (GAUZE/BANDAGES/DRESSINGS) IMPLANT
WATER STERILE IRR 250ML POUR (IV SOLUTION) ×2 IMPLANT

## 2023-12-10 NOTE — Anesthesia Postprocedure Evaluation (Signed)
 Anesthesia Post Note  Patient: Douglas Keller.  Procedure(s) Performed: PHACOEMULSIFICATION, CATARACT, WITH IOL INSERTION (Left: Eye) INSERTION, STENT, DRUG-ELUTING, LACRIMAL CANALICULUS (Left)  Patient location during evaluation: PACU Anesthesia Type: MAC Level of consciousness: awake and alert Pain management: pain level controlled Vital Signs Assessment: post-procedure vital signs reviewed and stable Respiratory status: spontaneous breathing, nonlabored ventilation, respiratory function stable and patient connected to nasal cannula oxygen  Cardiovascular status: stable and blood pressure returned to baseline Postop Assessment: no apparent nausea or vomiting Anesthetic complications: no  No notable events documented.   Last Vitals:  Vitals:   12/10/23 1109 12/10/23 1153  BP: (!) 167/77 (!) 150/99  Pulse: (!) 56 (!) 55  Resp: 20 15  Temp: 36.5 C 36.4 C  SpO2: 96% 100%    Last Pain:  Vitals:   12/10/23 1153  TempSrc: Oral  PainSc: 0-No pain                 Beacher Limerick

## 2023-12-10 NOTE — Discharge Instructions (Signed)
 Please discharge patient when stable, will follow up today with Dr. Ilsa Iha at the [facility] office immediately following discharge.  Leave shield in place until visit.  All paperwork with discharge instructions will be given at the office.  [facility] Address:  [address]  [place], [place] [address]  Dr. Chaya Jan Phone: [phone]

## 2023-12-10 NOTE — Transfer of Care (Signed)
 Immediate Anesthesia Transfer of Care Note  Patient: Douglas Keller.  Procedure(s) Performed: PHACOEMULSIFICATION, CATARACT, WITH IOL INSERTION (Left: Eye) INSERTION, STENT, DRUG-ELUTING, LACRIMAL CANALICULUS (Left)  Patient Location: Short Stay  Anesthesia Type:MAC  Level of Consciousness: awake, alert , and oriented  Airway & Oxygen  Therapy: Patient Spontanous Breathing  Post-op Assessment: Report given to RN and Post -op Vital signs reviewed and stable  Post vital signs: Reviewed and stable  Last Vitals:  Vitals Value Taken Time  BP    Temp    Pulse    Resp    SpO2      Last Pain:  Vitals:   12/10/23 1109  TempSrc: Oral  PainSc: 0-No pain         Complications: No notable events documented.

## 2023-12-10 NOTE — Op Note (Signed)
 Date of procedure: 12/10/23  Pre-operative diagnosis: Visually significant age-related nuclear cataract, Left Eye (H25.12)  Post-operative diagnosis: Visually significant age-related nuclear cataract, Left Eye H25.12; Ocular Pain and Inflammation, Left eye H57.12  Procedure: Removal of cataract via phacoemulsification and insertion of intra-ocular lens J&J DIB00 +20.0D into the capsular bag of the Left Eye  Attending surgeon: Gale Jude, MD  Anesthesia: MAC, Topical Akten   Complications: None  Estimated Blood Loss: <75mL (minimal)  Specimens: None  Implants:  Implant Name Type Inv. Item Serial No. Manufacturer Lot No. LRB No. Used Action  LENS IOL TECNIS EYHANCE 20.0 - [id number] Intraocular Lens LENS IOL TECNIS EYHANCE 20.0 [id number] [employer]  Left 1 Implanted    Indications:  Visually significant age-related cataract, Left Eye  Procedure:  The patient was seen and identified in the pre-operative area. The operative eye was identified and dilated.  The operative eye was marked.  Topical anesthesia was administered to the operative eye.     The patient was then to the operative suite and placed in the supine position.  A timeout was performed confirming the patient, procedure to be performed, and all other relevant information.   The patient's face was prepped and draped in the usual fashion for intra-ocular surgery.  A lid speculum was placed into the operative eye and the surgical microscope moved into place and focused.  An inferotemporal paracentesis was created using a 20 gauge paracentesis blade.  BSS mixed with Omidria , followed by 1% lidocaine  was injected into the anterior chamber.  Viscoelastic was injected into the anterior chamber.  A temporal clear-corneal main wound incision was created using a 2.44mm microkeratome.  A continuous curvilinear capsulorrhexis was initiated using an irrigating cystitome and completed using capsulorrhexis forceps.  Hydrodissection and  hydrodeliniation were performed.  Viscoelastic was injected into the anterior chamber.  A phacoemulsification handpiece and a chopper as a second instrument were used to remove the nucleus and epinucleus. The irrigation/aspiration handpiece was used to remove any remaining cortical material.   The capsular bag was reinflated with viscoelastic, checked, and found to be intact.  The intraocular lens was inserted into the capsular bag.  The irrigation/aspiration handpiece was used to remove any remaining viscoelastic.  The clear corneal wound and paracentesis wounds were then hydrated and checked with Weck-Cels to be watertight. Moxifloxacin  was instilled into the anterior chamber.  The lid-speculum and drape were removed. The lower punctum was dilated, and the dextenza  implant was inserted into it. The patient's face was cleaned with a wet and dry 4x4.  A clear shield was taped over the eye. The patient was taken to the post-operative care unit in good condition, having tolerated the procedure well.  Post-Op Instructions: The patient will follow up at [facility] for a same day post-operative evaluation and will receive all other orders and instructions.

## 2023-12-10 NOTE — Anesthesia Preprocedure Evaluation (Addendum)
 Anesthesia Evaluation  Patient identified by MRN, date of birth, ID band Patient awake    Reviewed: Allergy & Precautions, H&P , NPO status , Patient's Chart, lab work & pertinent test results  Airway Mallampati: II  TM Distance: >3 FB Neck ROM: Full    Dental no notable dental hx.    Pulmonary sleep apnea    Pulmonary exam normal breath sounds clear to auscultation       Cardiovascular hypertension, + CAD  Normal cardiovascular exam+ Valvular Problems/Murmurs  Rhythm:Regular Rate:Normal     Neuro/Psych  PSYCHIATRIC DISORDERS  Depression    negative neurological ROS     GI/Hepatic Neg liver ROS,GERD  ,,  Endo/Other  diabetes    Renal/GU negative Renal ROS  negative genitourinary   Musculoskeletal  (+) Arthritis ,    Abdominal   Peds negative pediatric ROS (+)  Hematology negative hematology ROS (+)   Anesthesia Other Findings   Reproductive/Obstetrics negative OB ROS                             Anesthesia Physical Anesthesia Plan  ASA: 3  Anesthesia Plan: MAC   Post-op Pain Management:    Induction:   PONV Risk Score and Plan: Treatment may vary due to age or medical condition  Airway Management Planned: Nasal Cannula  Additional Equipment:   Intra-op Plan:   Post-operative Plan:   Informed Consent: I have reviewed the patients History and Physical, chart, labs and discussed the procedure including the risks, benefits and alternatives for the proposed anesthesia with the patient or authorized representative who has indicated his/her understanding and acceptance.     Dental advisory given  Plan Discussed with:   Anesthesia Plan Comments:        Anesthesia Quick Evaluation

## 2023-12-10 NOTE — Anesthesia Procedure Notes (Signed)
 Procedure Name: MAC Date/Time: 12/10/2023 11:31 AM  Performed by: Sherwin Donate, CRNAPre-anesthesia Checklist: Patient identified, Emergency Drugs available, Suction available and Patient being monitored Patient Re-evaluated:Patient Re-evaluated prior to induction Oxygen  Delivery Method: Nasal cannula Placement Confirmation: positive ETCO2

## 2023-12-10 NOTE — Interval H&P Note (Signed)
 History and Physical Interval Note:  12/10/2023 11:14 AM  The H and P was reviewed and updated. The patient was examined.  No changes were found after exam.  The surgical eye was marked.  Douglas Keller

## 2023-12-14 ENCOUNTER — Encounter (HOSPITAL_COMMUNITY): Payer: Self-pay | Admitting: Optometry

## 2023-12-18 ENCOUNTER — Other Ambulatory Visit: Payer: Self-pay | Admitting: Cardiovascular Disease

## 2023-12-21 DIAGNOSIS — M25562 Pain in left knee: Secondary | ICD-10-CM | POA: Diagnosis not present

## 2024-01-17 ENCOUNTER — Telehealth: Payer: Self-pay

## 2024-01-17 NOTE — Telephone Encounter (Signed)
 Copied from CRM [id number]. Topic: Clinical - Request for Lab/Test Order >> Jan 17, 2024  3:40 PM Sasha H wrote: Reason for CRM: Pt is wanting to know if it is time for him to do labwork

## 2024-01-18 NOTE — Telephone Encounter (Signed)
 Called scheduled with another provider

## 2024-01-31 ENCOUNTER — Ambulatory Visit (INDEPENDENT_AMBULATORY_CARE_PROVIDER_SITE_OTHER): Admitting: Nurse Practitioner

## 2024-01-31 ENCOUNTER — Other Ambulatory Visit: Payer: Self-pay | Admitting: Nurse Practitioner

## 2024-01-31 ENCOUNTER — Encounter: Payer: Self-pay | Admitting: Nurse Practitioner

## 2024-01-31 VITALS — BP 131/72 | HR 63 | Ht 66.0 in | Wt 205.6 lb

## 2024-01-31 DIAGNOSIS — G4733 Obstructive sleep apnea (adult) (pediatric): Secondary | ICD-10-CM

## 2024-01-31 DIAGNOSIS — E1169 Type 2 diabetes mellitus with other specified complication: Secondary | ICD-10-CM

## 2024-01-31 DIAGNOSIS — I10 Essential (primary) hypertension: Secondary | ICD-10-CM | POA: Diagnosis not present

## 2024-01-31 DIAGNOSIS — E785 Hyperlipidemia, unspecified: Secondary | ICD-10-CM

## 2024-01-31 DIAGNOSIS — E7800 Pure hypercholesterolemia, unspecified: Secondary | ICD-10-CM | POA: Diagnosis not present

## 2024-01-31 MED ORDER — MELATONIN 5 MG PO TABS: 5.0000 mg | ORAL_TABLET | Freq: Every evening | ORAL | 0 refills | Status: DC | PRN

## 2024-01-31 NOTE — Patient Instructions (Signed)
 1) Melatonin 5-10 mg nightly at bedtime as needed for sleep 2) Fasting labs today, will contact with results when available 3) Follow up appt in 4 months

## 2024-01-31 NOTE — Progress Notes (Signed)
 Established Patient Office Visit  Subjective:  Patient ID: Douglas Ohair., male    DOB: June 02, 1941  Age: 83 y.o. MRN: [id number]  Chief Complaint  Patient presents with   Diabetes    Follow up    Hypertension    Follow up    Patient here today for a follow up, reports hx of not sleeping.  Hx of sleep apnea and inability to tolerate mask.  He has tried trazodone  previously from another provider and reports he did not sleep.  He does not want to retry the sleep apnea mask or set up testing just yet.  He has used Nyquil in the past and states he sleeps well sometimes with it.  He has a hx of DMII and was unable to tolerate metformin  to GI symptoms.    Diabetes  Hypertension    No other concerns at this time.   Past Medical History:  Diagnosis Date   Aortic valve sclerosis    Atherosclerosis of coronary artery without angina pectoris 07/05/2014   Cervical spondylosis without myelopathy 02/13/2013   Chest pain 01/29/2019   Degeneration of cervical intervertebral disc 02/13/2013   Depression    sometimes   Diabetes mellitus type 2, diet-controlled (HCC)    Elevated troponin    GERD (gastroesophageal reflux disease)    Heart murmur    HTN (hypertension) 07/06/2013   Hypercholesterolemia 08/21/2015   Hyperlipidemia 07/06/2013   Medication management 08/21/2015   Pre-diabetes    Rectal bleeding 09/21/2019   Stiffness of left shoulder joint 07/19/2013    Past Surgical History:  Procedure Laterality Date   BIOPSY  12/14/2019   Procedure: BIOPSY;  Surgeon: Shaaron Lamar HERO, MD;  Location: [facility];  Service: Endoscopy;;   CATARACT EXTRACTION W/PHACO Right 11/26/2023   Procedure: PHACOEMULSIFICATION, CATARACT, WITH IOL INSERTION;  Surgeon: Juli Blunt, MD;  Location: [facility];  Service: Ophthalmology;  Laterality: Right;  CDE: 2.89   CATARACT EXTRACTION W/PHACO Left 12/10/2023   Procedure: PHACOEMULSIFICATION, CATARACT, WITH IOL INSERTION;  Surgeon: Juli Blunt, MD;   Location: [facility];  Service: Ophthalmology;  Laterality: Left;  CDE: 3.78   CERVICAL SPINE SURGERY     COLONOSCOPY  07/26/2012   Procedure: COLONOSCOPY;  Surgeon: Oneil DELENA Budge, MD;  Location: [facility];  Service: Gastroenterology;  Laterality: N/A;   COLONOSCOPY WITH PROPOFOL  N/A 12/14/2019   Procedure: COLONOSCOPY WITH PROPOFOL ;  Surgeon: Shaaron Lamar HERO, MD;  Location: [facility];  Service: Endoscopy;  Laterality: N/A;  9:00am   INSERTION, STENT, DRUG-ELUTING, LACRIMAL CANALICULUS Right 11/26/2023   Procedure: INSERTION, STENT, DRUG-ELUTING, LACRIMAL CANALICULUS;  Surgeon: Juli Blunt, MD;  Location: [facility];  Service: Ophthalmology;  Laterality: Right;   INSERTION, STENT, DRUG-ELUTING, LACRIMAL CANALICULUS Left 12/10/2023   Procedure: INSERTION, STENT, DRUG-ELUTING, LACRIMAL CANALICULUS;  Surgeon: Juli Blunt, MD;  Location: [facility];  Service: Ophthalmology;  Laterality: Left;   KNEE ARTHROSCOPY Left    torn ligament   LUMBAR LAMINECTOMY/DECOMPRESSION MICRODISCECTOMY Bilateral 12/27/2019   Procedure: BIATERAL LUMBAR TWO- LUMBAR THREE, LUMBAR THREE- LUMBAR FOUR, LUMBAR FOUR-FIVE LAMINOTOMY/FORAMINOTOMY 3 LEVELS;  Surgeon: Budge Purchase, MD;  [facility] OR;  Service: Neurosurgery;  Laterality: Bilateral;  POSTERIOR   PVC ABLATION N/A 04/18/2021   Procedure: PVC ABLATION;  Surgeon: Cindie Ole DASEN, MD;  Location: [facility];  Service: Cardiovascular;  Laterality: N/A;   REPLACEMENT TOTAL KNEE Right 09/02/2022   SPINE SURGERY N/A    Phreesia 02/24/2020    Social History   Socioeconomic History  Marital status: Widowed    Spouse name: Cherly    Number of children: 1   Years of education: Not on file   Highest education level: 12th grade  Occupational History    Comment: Retired   Tobacco Use   Smoking status: Never    Passive exposure: Never   Smokeless tobacco: Never  Vaping Use   Vaping status: Never Used  Substance and Sexual Activity   Alcohol  use: No   Drug use: No   Sexual activity: Not Currently  Other Topics Concern   Not on file  Social History Narrative   Live alone right now, wife passed in 2009    Sabrina Biggs daughter-grandchild 2 live in [place]      Enjoys playing golf      Diet: eats what he likes, sweet, fast food, does not eat a lot of canned or boxed foods.    Does not eat enough fruits and veggies.       Caffeine: 1 cup daily   Water : 3-4 bottles of water  daily, Gatorade every now and then         Wears seat belt   Wears sun protection when golfing   Smoke detectors at home   Does not use phone while driving   Social Drivers of Health   Financial Resource Strain: Low Risk  (08/02/2023)   Overall Financial Resource Strain (CARDIA)    Difficulty of Paying Living Expenses: Not hard at all  Food Insecurity: No Food Insecurity (08/02/2023)   Hunger Vital Sign    Worried About Running Out of Food in the Last Year: Never true    Ran Out of Food in the Last Year: Never true  Transportation Needs: No Transportation Needs (08/02/2023)   PRAPARE - Administrator, Civil Service (Medical): No    Lack of Transportation (Non-Medical): No  Physical Activity: Inactive (05/02/2023)   Exercise Vital Sign    Days of Exercise per Week: 0 days    Minutes of Exercise per Session: 30 min  Stress: Stress Concern Present (05/02/2023)   [employer] of Occupational Health - Occupational Stress Questionnaire    Feeling of Stress : To some extent  Social Connections: Moderately Integrated (08/02/2023)   Social Connection and Isolation Panel    Frequency of Communication with Friends and Family: More than three times a week    Frequency of Social Gatherings with Friends and Family: More than three times a week    Attends Religious Services: More than 4 times per year    Active Member of [employer] or Organizations: Yes    Attends Banker Meetings: More than 4 times per year    Marital Status: Widowed   Intimate Partner Violence: Not At Risk (04/14/2023)   Humiliation, Afraid, Rape, and Kick questionnaire    Fear of Current or Ex-Partner: No    Emotionally Abused: No    Physically Abused: No    Sexually Abused: No    Family History  Problem Relation Age of Onset   Asthma Mother    Hypertension Father    Colon cancer Neg Hx     Allergies  Allergen Reactions   Metformin  And Related Diarrhea and Nausea And Vomiting   Statins Other (See Comments)    Muscle/joint pain     Outpatient Medications Prior to Visit  Medication Sig   amiodarone  (PACERONE ) 200 MG tablet Take 200 mg five days a week   amLODipine  (NORVASC ) 10 MG tablet TAKE  1 TABLET EVERY DAY   aspirin  EC 81 MG tablet Take 81 mg by mouth daily.   benazepril  (LOTENSIN ) 10 MG tablet TAKE 1 TABLET EVERY DAY   blood glucose meter kit and supplies Dispense based on patient and insurance preference. Use up to four times daily as directed. (FOR ICD-10 E10.9, E11.9).   Continuous Glucose Sensor (FREESTYLE LIBRE 3 SENSOR) MISC 1 each by Does not apply route continuous. Place 1 sensor on the skin every 14 days. Use to check glucose continuously   Evolocumab  (REPATHA  SURECLICK) 140 MG/ML SOAJ INJECT 140 MG INTO THE SKIN EVERY 14 DAYS   magnesium  oxide (MAG-OX) 400 MG tablet 1 tablet as needed   meclizine  (ANTIVERT ) 25 MG tablet Take 1 tablet (25 mg total) by mouth 3 (three) times daily as needed for dizziness.   multivitamin-iron-minerals-folic acid  (CENTRUM) chewable tablet Chew 1 tablet by mouth daily.   ondansetron  (ZOFRAN -ODT) 4 MG disintegrating tablet 4mg  ODT q4 hours prn nausea/vomit   spironolactone -hydrochlorothiazide  (ALDACTAZIDE ) 25-25 MG tablet TAKE 1 TABLET EVERY OTHER DAY   No facility-administered medications prior to visit.    ROS     Objective:   BP 131/72   Pulse 63   Ht 5' 6 (1.676 m)   Wt 205 lb 9.6 oz (93.3 kg)   SpO2 91%   BMI 33.18 kg/m   Vitals:   01/31/24 0915  BP: 131/72  Pulse: 63   Height: 5' 6 (1.676 m)  Weight: 205 lb 9.6 oz (93.3 kg)  SpO2: 91%  BMI (Calculated): 33.2    Physical Exam Vitals and nursing note reviewed.  Constitutional:      Appearance: Normal appearance.  HENT:     Head: Normocephalic.     Nose: Nose normal.  Eyes:     Pupils: Pupils are equal, round, and reactive to light.  Cardiovascular:     Rate and Rhythm: Normal rate and regular rhythm.  Pulmonary:     Effort: Pulmonary effort is normal.     Breath sounds: Normal breath sounds.  Abdominal:     General: Bowel sounds are normal.     Palpations: Abdomen is soft.  Musculoskeletal:     Cervical back: Normal range of motion and neck supple.  Skin:    General: Skin is warm and dry.  Neurological:     Mental Status: He is alert and oriented to person, place, and time.  Psychiatric:        Mood and Affect: Mood normal.        Behavior: Behavior normal.      No results found for any visits on 01/31/24.  Recent Results (from the past 2160 hours)  Glucose, capillary     Status: Abnormal   Collection Time: 11/26/23  7:45 AM  Result Value Ref Range   Glucose-Capillary 107 (H) 70 - 99 mg/dL    Comment: Glucose reference range applies only to samples taken after fasting for at least 8 hours.  Glucose, capillary     Status: Abnormal   Collection Time: 12/10/23 11:10 AM  Result Value Ref Range   Glucose-Capillary 112 (H) 70 - 99 mg/dL    Comment: Glucose reference range applies only to samples taken after fasting for at least 8 hours.      Assessment & Plan:   Problem List Items Addressed This Visit       Cardiovascular and Mediastinum   HTN (hypertension)   Relevant Orders   CMP14+EGFR     Endocrine   Type  2 diabetes mellitus with hyperlipidemia (HCC) - Primary   Relevant Orders   Hemoglobin A1c     Other   Hyperlipidemia   Relevant Orders   Lipid panel    No follow-ups on file.   Total time spent: 20 minutes  Neale Carpen, NP  01/31/2024   This  document may have been prepared by [facility] Voice Recognition software and as such may include unintentional dictation errors.

## 2024-02-01 ENCOUNTER — Telehealth: Payer: Self-pay

## 2024-02-01 ENCOUNTER — Ambulatory Visit: Payer: Self-pay

## 2024-02-01 LAB — HEMOGLOBIN A1C
Est. average glucose Bld gHb Est-mCnc: 140 mg/dL
Hgb A1c MFr Bld: 6.5 % — ABNORMAL HIGH (ref 4.8–5.6)

## 2024-02-01 LAB — CMP14+EGFR
ALT: 17 IU/L (ref 0–44)
AST: 24 IU/L (ref 0–40)
Albumin: 4.4 g/dL (ref 3.7–4.7)
Alkaline Phosphatase: 78 IU/L (ref 44–121)
BUN/Creatinine Ratio: 10 (ref 10–24)
BUN: 11 mg/dL (ref 8–27)
Bilirubin Total: 0.5 mg/dL (ref 0.0–1.2)
CO2: 22 mmol/L (ref 20–29)
Calcium: 9 mg/dL (ref 8.6–10.2)
Chloride: 102 mmol/L (ref 96–106)
Creatinine, Ser: 1.07 mg/dL (ref 0.76–1.27)
Globulin, Total: 2.8 g/dL (ref 1.5–4.5)
Glucose: 100 mg/dL — ABNORMAL HIGH (ref 70–99)
Potassium: 4.2 mmol/L (ref 3.5–5.2)
Sodium: 142 mmol/L (ref 134–144)
Total Protein: 7.2 g/dL (ref 6.0–8.5)
eGFR: 69 mL/min/1.73 (ref >59)

## 2024-02-01 LAB — LIPID PANEL
Chol/HDL Ratio: 2.9 ratio (ref 0.0–5.0)
Cholesterol, Total: 140 mg/dL (ref 100–199)
HDL: 49 mg/dL (ref >39)
LDL Chol Calc (NIH): 62 mg/dL (ref 0–99)
Triglycerides: 171 mg/dL — ABNORMAL HIGH (ref 0–149)
VLDL Cholesterol Cal: 29 mg/dL (ref 5–40)

## 2024-02-01 NOTE — Telephone Encounter (Signed)
 Noted patient advised of labs

## 2024-02-01 NOTE — Telephone Encounter (Signed)
 Copied from CRM [id number]. Topic: Clinical - Lab/Test Results >> Feb 01, 2024 11:35 AM DeAngela L wrote: Reason for CRM: pt returned call read note from provider and pt understood and no further questions

## 2024-03-02 ENCOUNTER — Other Ambulatory Visit: Payer: Self-pay | Admitting: Cardiovascular Disease

## 2024-03-02 DIAGNOSIS — I10 Essential (primary) hypertension: Secondary | ICD-10-CM

## 2024-04-17 ENCOUNTER — Ambulatory Visit: Payer: Medicare HMO

## 2024-04-17 VITALS — BP 123/78 | Ht 66.0 in | Wt 207.0 lb

## 2024-04-17 DIAGNOSIS — E785 Hyperlipidemia, unspecified: Secondary | ICD-10-CM | POA: Diagnosis not present

## 2024-04-17 DIAGNOSIS — E1169 Type 2 diabetes mellitus with other specified complication: Secondary | ICD-10-CM

## 2024-04-17 DIAGNOSIS — Z0001 Encounter for general adult medical examination with abnormal findings: Secondary | ICD-10-CM

## 2024-04-17 DIAGNOSIS — Z0000 Encounter for general adult medical examination without abnormal findings: Secondary | ICD-10-CM

## 2024-04-17 MED ORDER — FREESTYLE LIBRE 3 PLUS SENSOR MISC: 3 refills | Status: DC

## 2024-04-17 NOTE — Patient Instructions (Signed)
 Mr. Douglas Keller,  Thank you for taking the time for your Medicare Wellness Visit. I appreciate your continued commitment to your health goals. Please review the care plan we discussed, and feel free to reach out if I can assist you further.  Medicare recommends these wellness visits once per year to help you and your care team stay ahead of potential health issues. These visits are designed to focus on prevention, allowing your provider to concentrate on managing your acute and chronic conditions during your regular appointments.  Please note that Annual Wellness Visits do not include a physical exam. Some assessments may be limited, especially if the visit was conducted virtually. If needed, we may recommend a separate in-person follow-up with your provider.  Wishing you excellent health and many blessings in the year to come!  -Yasmene Salomone, CMA  Ongoing Care Seeing your primary care provider every 3 to 6 months helps us  monitor your health and provide consistent, personalized care.   Recommended Screenings:  Health Maintenance  Topic Date Due   Eye exam for diabetics  09/16/2022   Complete foot exam   10/12/2023   Yearly kidney health urinalysis for diabetes  02/04/2024   Flu Shot  02/18/2024   COVID-19 Vaccine (7 - Moderna risk 2024-25 season) 03/20/2024   Hemoglobin A1C  08/02/2024   Yearly kidney function blood test for diabetes  01/30/2025   Medicare Annual Wellness Visit  04/17/2025   DTaP/Tdap/Td vaccine (3 - Tdap) 12/27/2031   Pneumococcal Vaccine for age over 46  Completed   Zoster (Shingles) Vaccine  Completed   HPV Vaccine  Aged Out   Meningitis B Vaccine  Aged Out   Hepatitis C Screening  Discontinued       04/17/2024    1:27 PM  Advanced Directives  Does Patient Have a Medical Advance Directive? No  Would patient like information on creating a medical advance directive? No - Patient declined   Advance Care Planning is important because it: Ensures you receive medical care  that aligns with your values, goals, and preferences. Provides guidance to your family and loved ones, reducing the emotional burden of decision-making during critical moments.  Vision: Annual vision screenings are recommended for early detection of glaucoma, cataracts, and diabetic retinopathy. These exams can also reveal signs of chronic conditions such as diabetes and high blood pressure.  Dental: Annual dental screenings help detect early signs of oral cancer, gum disease, and other conditions linked to overall health, including heart disease and diabetes.  Please see the attached documents for additional preventive care recommendations.

## 2024-04-17 NOTE — Progress Notes (Signed)
 Please see NHA note at bottom of progress note for additional info  Please attest and cosign this visit due to patients primary care provider not being in the office at the time the visit was completed.  Subjective:   Karlo Goeden. is a 83 y.o. who presents for a Medicare Wellness preventive visit.  As a reminder, Annual Wellness Visits don't include a physical exam, and some assessments may be limited, especially if this visit is performed virtually. We may recommend an in-person follow-up visit with your provider if needed.  Visit Complete: Virtual I connected with  Vicenta Nancee Raddle. on 04/17/24 by a audio enabled telemedicine application and verified that I am speaking with the correct person using two identifiers.  Patient Location: Home  Provider Location: Home Office  I discussed the limitations of evaluation and management by telemedicine. The patient expressed understanding and agreed to proceed.  Vital Signs: Because this visit was a virtual/telehealth visit, some criteria may be missing or patient reported. Any vitals not documented were not able to be obtained and vitals that have been documented are patient reported.  VideoDeclined- This patient declined Librarian, academic. Therefore the visit was completed with audio only.  Persons Participating in Visit: Patient.  AWV Questionnaire: No: Patient Medicare AWV questionnaire was not completed prior to this visit.  Cardiac Risk Factors include: advanced age (>68men, >72 women);diabetes mellitus;dyslipidemia;hypertension;male gender;obesity (BMI >30kg/m2)     Objective:    Today's Vitals   04/17/24 1325  BP: 123/78  Weight: 207 lb (93.9 kg)  Height: 5' 6 (1.676 m)   Body mass index is 33.41 kg/m.     04/17/2024    1:27 PM 12/10/2023   11:23 AM 12/08/2023    1:09 PM 11/26/2023    7:43 AM 11/22/2023    2:31 PM 07/09/2023    6:29 AM 04/14/2023   11:19 AM  Advanced Directives  Does  Patient Have a Medical Advance Directive? No Yes Yes Yes Yes No Yes  Type of Special educational needs teacher of [place];Living will Healthcare Power of [place];Living will Healthcare Power of [place];Living will Healthcare Power of [place];Living will  Healthcare Power of [place];Living will  Does patient want to make changes to medical advance directive?   No - Guardian declined No - Patient declined     Copy of Healthcare Power of Attorney in Chart?   No - copy requested No - copy requested   No - copy requested  Would patient like information on creating a medical advance directive? No - Patient declined          Current Medications (verified) Outpatient Encounter Medications as of 04/17/2024  Medication Sig   amiodarone  (PACERONE ) 200 MG tablet Take 200 mg five days a week   amLODipine  (NORVASC ) 10 MG tablet TAKE 1 TABLET EVERY DAY   aspirin  EC 81 MG tablet Take 81 mg by mouth daily.   benazepril  (LOTENSIN ) 10 MG tablet TAKE 1 TABLET EVERY DAY   blood glucose meter kit and supplies Dispense based on patient and insurance preference. Use up to four times daily as directed. (FOR ICD-10 E10.9, E11.9).   Evolocumab  (REPATHA  SURECLICK) 140 MG/ML SOAJ INJECT 140 MG INTO THE SKIN EVERY 14 DAYS   magnesium  oxide (MAG-OX) 400 MG tablet 1 tablet as needed   meclizine  (ANTIVERT ) 25 MG tablet Take 1 tablet (25 mg total) by mouth 3 (three) times daily as needed for dizziness.   multivitamin-iron-minerals-folic acid  (CENTRUM) chewable tablet Chew 1  tablet by mouth daily.   spironolactone -hydrochlorothiazide  (ALDACTAZIDE ) 25-25 MG tablet TAKE 1 TABLET EVERY OTHER DAY   [DISCONTINUED] Continuous Glucose Sensor (FREESTYLE LIBRE 3 SENSOR) MISC 1 each by Does not apply route continuous. Place 1 sensor on the skin every 14 days. Use to check glucose continuously   [DISCONTINUED] melatonin 5 MG TABS Take 1 tablet (5 mg total) by mouth at bedtime as needed (for sleep).   [DISCONTINUED] ondansetron   (ZOFRAN -ODT) 4 MG disintegrating tablet 4mg  ODT q4 hours prn nausea/vomit   No facility-administered encounter medications on file as of 04/17/2024.    Allergies (verified) Metformin  and related and Statins   History: Past Medical History:  Diagnosis Date   Aortic valve sclerosis    Atherosclerosis of coronary artery without angina pectoris 07/05/2014   Cervical spondylosis without myelopathy 02/13/2013   Chest pain 01/29/2019   Degeneration of cervical intervertebral disc 02/13/2013   Depression    sometimes   Diabetes mellitus type 2, diet-controlled (HCC)    Elevated troponin    GERD (gastroesophageal reflux disease)    Heart murmur    HTN (hypertension) 07/06/2013   Hypercholesterolemia 08/21/2015   Hyperlipidemia 07/06/2013   Medication management 08/21/2015   Pre-diabetes    Rectal bleeding 09/21/2019   Stiffness of left shoulder joint 07/19/2013   Past Surgical History:  Procedure Laterality Date   BIOPSY  12/14/2019   Procedure: BIOPSY;  Surgeon: Shaaron Lamar HERO, MD;  [facility]: [facility];  Service: Endoscopy;;   CATARACT EXTRACTION W/PHACO Right 11/26/2023   Procedure: PHACOEMULSIFICATION, CATARACT, WITH IOL INSERTION;  Surgeon: Juli Blunt, MD;  Location: [facility];  Service: Ophthalmology;  Laterality: Right;  CDE: 2.89   CATARACT EXTRACTION W/PHACO Left 12/10/2023   Procedure: PHACOEMULSIFICATION, CATARACT, WITH IOL INSERTION;  Surgeon: Juli Blunt, MD;  Location: [facility];  Service: Ophthalmology;  Laterality: Left;  CDE: 3.78   CERVICAL SPINE SURGERY     COLONOSCOPY  07/26/2012   Procedure: COLONOSCOPY;  Surgeon: Oneil DELENA Budge, MD;  Location: [facility];  Service: Gastroenterology;  Laterality: N/A;   COLONOSCOPY WITH PROPOFOL  N/A 12/14/2019   Procedure: COLONOSCOPY WITH PROPOFOL ;  Surgeon: Shaaron Lamar HERO, MD;  Location: [facility];  Service: Endoscopy;  Laterality: N/A;  9:00am   INSERTION, STENT, DRUG-ELUTING, LACRIMAL CANALICULUS Right 11/26/2023    Procedure: INSERTION, STENT, DRUG-ELUTING, LACRIMAL CANALICULUS;  Surgeon: Juli Blunt, MD;  Location: [facility];  Service: Ophthalmology;  Laterality: Right;   INSERTION, STENT, DRUG-ELUTING, LACRIMAL CANALICULUS Left 12/10/2023   Procedure: INSERTION, STENT, DRUG-ELUTING, LACRIMAL CANALICULUS;  Surgeon: Juli Blunt, MD;  Location: [facility];  Service: Ophthalmology;  Laterality: Left;   KNEE ARTHROSCOPY Left    torn ligament   LUMBAR LAMINECTOMY/DECOMPRESSION MICRODISCECTOMY Bilateral 12/27/2019   Procedure: BIATERAL LUMBAR TWO- LUMBAR THREE, LUMBAR THREE- LUMBAR FOUR, LUMBAR FOUR-FIVE LAMINOTOMY/FORAMINOTOMY 3 LEVELS;  Surgeon: Budge Purchase, MD;  [facility];  Service: Neurosurgery;  Laterality: Bilateral;  POSTERIOR   PVC ABLATION N/A 04/18/2021   Procedure: PVC ABLATION;  Surgeon: Cindie Ole DASEN, MD;  Location: [facility];  Service: Cardiovascular;  Laterality: N/A;   REPLACEMENT TOTAL KNEE Right 09/02/2022   SPINE SURGERY N/A    [employer]rees[employer] 02/24/2020   Family History  Problem Relation Age of Onset   Asthma Mother    Hypertension Father    Colon cancer Neg Hx    Social History   Socioeconomic History   Marital status: Widowed    Spouse name: Cherly    Number of children: 1  Years of education: Not on file   Highest education level: 12th grade  Occupational History    Comment: Retired   Tobacco Use   Smoking status: Never    Passive exposure: Never   Smokeless tobacco: Never  Vaping Use   Vaping status: Never Used  Substance and Sexual Activity   Alcohol use: No   Drug use: No   Sexual activity: Not Currently  Other Topics Concern   Not on file  Social History Narrative   Live alone right now, wife passed in 2009    Sabrina Biggs daughter-grandchild 2 live in [place]      Enjoys playing golf      Diet: eats what he likes, sweet, fast food, does not eat a lot of canned or boxed foods.    Does not eat enough fruits and veggies.       Caffeine: 1  cup daily   Water : 3-4 bottles of water  daily, Gatorade every now and then         Wears seat belt   Wears sun protection when golfing   Smoke detectors at home   Does not use phone while driving   Social Drivers of Health   Financial Resource Strain: Low Risk  (04/17/2024)   Overall Financial Resource Strain (CARDIA)    Difficulty of Paying Living Expenses: Not hard at all  Food Insecurity: No Food Insecurity (04/17/2024)   Hunger Vital Sign    Worried About Running Out of Food in the Last Year: Never true    Ran Out of Food in the Last Year: Never true  Transportation Needs: No Transportation Needs (04/17/2024)   PRAPARE - Administrator, Civil Service (Medical): No    Lack of Transportation (Non-Medical): No  Physical Activity: Sufficiently Active (04/17/2024)   Exercise Vital Sign    Days of Exercise per Week: 7 days    Minutes of Exercise per Session: 30 min  Stress: No Stress Concern Present (04/17/2024)   [employer] of Occupational Health - Occupational Stress Questionnaire    Feeling of Stress: Not at all  Social Connections: Moderately Integrated (04/17/2024)   Social Connection and Isolation Panel    Frequency of Communication with Friends and Family: More than three times a week    Frequency of Social Gatherings with Friends and Family: More than three times a week    Attends Religious Services: More than 4 times per year    Active Member of [employer] or Organizations: Yes    Attends Banker Meetings: More than 4 times per year    Marital Status: Widowed    Tobacco Counseling Counseling given: Yes    Clinical Intake:  Pre-visit preparation completed: Yes  Pain : No/denies pain     BMI - recorded: 33.41 Nutritional Status: BMI > 30  Obese Nutritional Risks: None Diabetes: Yes CBG done?: No (telehealth visit) Did pt. bring in CBG monitor from home?: No  Lab Results  Component Value Date   HGBA1C 6.5 (H) 01/31/2024   HGBA1C  6.1 (H) 08/06/2023   HGBA1C 6.3 (H) 02/04/2023     How often do you need to have someone help you when you read instructions, pamphlets, or other written materials from your doctor or pharmacy?: 1 - Never  Interpreter Needed?: No  Information entered by :: Treshon Stannard W CMA (AAMA)   Activities of Daily Living     04/17/2024    1:34 PM 12/08/2023    1:10 PM  In  your present state of health, do you have any difficulty performing the following activities:  Hearing? 0   Vision? 0   Difficulty concentrating or making decisions? 0   Walking or climbing stairs? 0   Dressing or bathing? 0   Doing errands, shopping? 0 0  Preparing Food and eating ? N   Using the Toilet? N   In the past six months, have you accidently leaked urine? N   Do you have problems with loss of bowel control? N   Managing your Medications? N   Managing your Finances? N   Housekeeping or managing your Housekeeping? N     Patient Care Team: Glennon Sand, NP (Inactive) as PCP - General (Nurse Practitioner) Francyne Headland, MD as PCP - Cardiology (Cardiology) Cindie Ole DASEN, MD as PCP - Electrophysiology (Cardiology) Shaaron Lamar HERO, MD as Consulting Physician (Gastroenterology) Ferdie Nest, PA-C as Physician Assistant (Family Medicine)  I have updated your Care Teams any recent Medical Services you may have received from other providers in the past year.     Assessment:   This is a routine wellness examination for [place].  Hearing/Vision screen Hearing Screening - Comments:: Patient denies any hearing difficulties.   Vision Screening - Comments:: Patient had cataract surgery this year in May. No longer uses glasses. Up to date on exams with Dr. Darroll   Goals Addressed               This Visit's Progress     Stay active and healthy (pt-stated)          Depression Screen     04/17/2024    1:29 PM 01/31/2024    9:16 AM 08/06/2023   10:08 AM 05/06/2023    9:53 AM 04/14/2023   11:19 AM  02/04/2023    9:16 AM 12/18/2022    3:54 PM  PHQ 2/9 Scores  PHQ - 2 Score 0 1 3 3  0 2 1  PHQ- 9 Score 3 8 8 9  8 7      Fall Risk     04/17/2024    1:29 PM 01/31/2024    9:15 AM 08/06/2023   10:09 AM 08/03/2023    3:08 PM 05/06/2023    9:53 AM  Fall Risk   Falls in the past year? 0 0 0 0 0  Number falls in past yr: 0 0 0 0 0  Injury with Fall? 0 0 0  0  Risk for fall due to : No Fall Risks No Fall Risks No Fall Risks No Fall Risks No Fall Risks  Follow up Falls evaluation completed;Education provided;Falls prevention discussed Falls evaluation completed Falls evaluation completed Falls evaluation completed Falls evaluation completed    MEDICARE RISK AT HOME:  Medicare Risk at Home Any stairs in or around the home?: Yes If so, are there any without handrails?: No Home free of loose throw rugs in walkways, pet beds, electrical cords, etc?: Yes Adequate lighting in your home to reduce risk of falls?: Yes Life alert?: No Use of a cane, walker or w/c?: No Grab bars in the bathroom?: No Shower chair or bench in shower?: No Elevated toilet seat or a handicapped toilet?: No  TIMED UP AND GO:  Was the test performed?  No  Cognitive Function: 6CIT completed    03/03/2021    8:47 AM  MMSE - Mini Mental State Exam  Not completed: Unable to complete        04/17/2024    1:29 PM 04/14/2023  11:20 AM 03/09/2022    9:49 AM 03/03/2021    8:47 AM 02/28/2020    4:32 PM  6CIT Screen  What Year? 0 points 0 points 0 points 0 points 0 points  What month? 0 points 0 points 0 points 0 points 0 points  What time? 0 points 0 points 0 points 0 points 0 points  Count back from 20 0 points 0 points 0 points 0 points 0 points  Months in reverse 0 points 0 points 0 points 0 points 0 points  Repeat phrase 0 points 0 points 4 points 0 points 0 points  Total Score 0 points 0 points 4 points 0 points 0 points    Immunizations Immunization History  Administered Date(s) Administered   Fluad  Quad(high Dose 65+) 03/20/2019, 05/03/2020, 04/28/2022, 03/30/2023   Influenza-Unspecified 04/19/2014, 05/20/2021   Moderna Covid-19 Vaccine Bivalent Booster 33yrs & up 10/31/2020   Moderna Sars-Covid-2 Vaccination 08/11/2019, 09/11/2019, 05/15/2020, 12/26/2021   PNEUMOCOCCAL CONJUGATE-20 12/26/2021   Pneumococcal Polysaccharide-23 11/29/2019   Pneumococcal-Unspecified 12/26/2021   Td 04/21/2011, 12/26/2021   Unspecified SARS-COV-2 Vaccination 03/30/2023   Zoster Recombinant(Shingrix) 04/15/2020, 08/19/2020    Screening Tests Health Maintenance  Topic Date Due   OPHTHALMOLOGY EXAM  09/16/2022   FOOT EXAM  10/12/2023   Diabetic kidney evaluation - Urine ACR  02/04/2024   Influenza Vaccine  02/18/2024   COVID-19 Vaccine (7 - Moderna risk 2024-25 season) 03/20/2024   HEMOGLOBIN A1C  08/02/2024   Diabetic kidney evaluation - eGFR measurement  01/30/2025   Medicare Annual Wellness (AWV)  04/17/2025   DTaP/Tdap/Td (3 - Tdap) 12/27/2031   Pneumococcal Vaccine: 50+ Years  Completed   Zoster Vaccines- Shingrix  Completed   HPV VACCINES  Aged Out   Meningococcal B Vaccine  Aged Out   Hepatitis C Screening  Discontinued    Health Maintenance Health Maintenance Due  Topic Date Due   OPHTHALMOLOGY EXAM  09/16/2022   FOOT EXAM  10/12/2023   Diabetic kidney evaluation - Urine ACR  02/04/2024   Influenza Vaccine  02/18/2024   COVID-19 Vaccine (7 - Moderna risk 2024-25 season) 03/20/2024   Health Maintenance Items Addressed:  Patient is due for a diabetic foot exam, Urine ACR and vaccines.   He stated he will get his Flu and Covid at [facility]. I requested last eye exam from Dr. Oneil Kawasaki   Additional Screening:  Vision Screening: Recommended annual ophthalmology exams for early detection of glaucoma and other disorders of the eye. Would you like a referral to an eye doctor? No    Dental Screening: Recommended annual dental exams for proper oral hygiene  Community Resource  Referral / Chronic Care Management: CRR required this visit?  No   CCM required this visit?  No   Plan:    I have personally reviewed and noted the following in the patient's chart:   Medical and social history Use of alcohol, tobacco or illicit drugs  Current medications and supplements including opioid prescriptions. Patient is not currently taking opioid prescriptions. Functional ability and status Nutritional status Physical activity Advanced directives List of other physicians Hospitalizations, surgeries, and ER visits in previous 12 months Vitals Screenings to include cognitive, depression, and falls Referrals and appointments  In addition, I have reviewed and discussed with patient certain preventive protocols, quality metrics, and best practice recommendations. A written personalized care plan for preventive services as well as general preventive health recommendations were provided to patient.   Cherene Dobbins, CMA   04/17/2024  After Visit Summary: (MyChart) Due to this being a telephonic visit, the after visit summary with patients personalized plan was offered to patient via MyChart   Notes: Prescription sent to pharmacy for a new Libre 3 plus. Manufacturer is discontinuing the [place] 3. Appointment scheduled with Dr Tobie for October 30 to address patients concern for right leg pain starting at knee that radiates to his foot since his doesn't have an appointment to establish with new NP until January

## 2024-04-19 ENCOUNTER — Other Ambulatory Visit: Payer: Self-pay | Admitting: Internal Medicine

## 2024-04-19 DIAGNOSIS — E1169 Type 2 diabetes mellitus with other specified complication: Secondary | ICD-10-CM

## 2024-05-15 ENCOUNTER — Other Ambulatory Visit: Payer: Self-pay | Admitting: Cardiovascular Disease

## 2024-05-15 ENCOUNTER — Other Ambulatory Visit: Payer: Self-pay | Admitting: Physician Assistant

## 2024-05-18 ENCOUNTER — Ambulatory Visit: Admitting: Internal Medicine

## 2024-05-18 VITALS — BP 139/81 | HR 79 | Ht 66.0 in | Wt 205.8 lb

## 2024-05-18 DIAGNOSIS — M5416 Radiculopathy, lumbar region: Secondary | ICD-10-CM | POA: Diagnosis not present

## 2024-05-18 DIAGNOSIS — E559 Vitamin D deficiency, unspecified: Secondary | ICD-10-CM | POA: Diagnosis not present

## 2024-05-18 DIAGNOSIS — I10 Essential (primary) hypertension: Secondary | ICD-10-CM

## 2024-05-18 DIAGNOSIS — E785 Hyperlipidemia, unspecified: Secondary | ICD-10-CM

## 2024-05-18 DIAGNOSIS — E1169 Type 2 diabetes mellitus with other specified complication: Secondary | ICD-10-CM

## 2024-05-18 DIAGNOSIS — I5032 Chronic diastolic (congestive) heart failure: Secondary | ICD-10-CM

## 2024-05-18 MED ORDER — GABAPENTIN 100 MG PO CAPS: 100.0000 mg | ORAL_CAPSULE | Freq: Every day | ORAL | 3 refills | Status: DC

## 2024-05-18 NOTE — Assessment & Plan Note (Signed)
 Appears euvolemic currently On spironolactone -HCTZ 25-25 mg QD and benazepril  10 mg QD Followed by cardiology

## 2024-05-18 NOTE — Progress Notes (Signed)
 Acute Office Visit  Subjective:    Patient ID: Douglas Kissel., male    DOB: [birth year], 83 y.o.   MRN: [id number]  Chief Complaint  Patient presents with   Leg Pain    Reports right leg pain radiating down , numbness and tingling.     HPI Patient is in today for complaint of right leg pain, which she describes as tingling sensation.  He also reports numbness of the right lower extremity, starting from knee to the feet.  He feels cold sensation of the foot, although foot would be warm to the touch.  Denies any recent injury.  He has felt unsteady while walking at times.  He rides stationary bicycle for 30 minutes almost every day, but denies any leg pain during it.  He reports history of lumbar spine surgery in the past.  Has chronic mild low back pain, but denies any recent worsening of the back pain.  Denies saddle anesthesia, urinary or stool incontinence.  He has a history of type II DM, which is well-controlled with diet alone.  Past Medical History:  Diagnosis Date   Aortic valve sclerosis    Atherosclerosis of coronary artery without angina pectoris 07/05/2014   Cervical spondylosis without myelopathy 02/13/2013   Chest pain 01/29/2019   Degeneration of cervical intervertebral disc 02/13/2013   Depression    sometimes   Diabetes mellitus type 2, diet-controlled (HCC)    Elevated troponin    GERD (gastroesophageal reflux disease)    Heart murmur    HTN (hypertension) 07/06/2013   Hypercholesterolemia 08/21/2015   Hyperlipidemia 07/06/2013   Medication management 08/21/2015   Pre-diabetes    Rectal bleeding 09/21/2019   Stiffness of left shoulder joint 07/19/2013    Past Surgical History:  Procedure Laterality Date   BIOPSY  12/14/2019   Procedure: BIOPSY;  Surgeon: Shaaron Lamar HERO, MD;  Location: [facility];  Service: Endoscopy;;   CATARACT EXTRACTION W/PHACO Right 11/26/2023   Procedure: PHACOEMULSIFICATION, CATARACT, WITH IOL INSERTION;  Surgeon: Juli Blunt, MD;   Location: [facility];  Service: Ophthalmology;  Laterality: Right;  CDE: 2.89   CATARACT EXTRACTION W/PHACO Left 12/10/2023   Procedure: PHACOEMULSIFICATION, CATARACT, WITH IOL INSERTION;  Surgeon: Juli Blunt, MD;  Location: [facility];  Service: Ophthalmology;  Laterality: Left;  CDE: 3.78   CERVICAL SPINE SURGERY     COLONOSCOPY  07/26/2012   Procedure: COLONOSCOPY;  Surgeon: Oneil DELENA Budge, MD;  Location: [facility];  Service: Gastroenterology;  Laterality: N/A;   COLONOSCOPY WITH PROPOFOL  N/A 12/14/2019   Procedure: COLONOSCOPY WITH PROPOFOL ;  Surgeon: Shaaron Lamar HERO, MD;  Location: [facility];  Service: Endoscopy;  Laterality: N/A;  9:00am   INSERTION, STENT, DRUG-ELUTING, LACRIMAL CANALICULUS Right 11/26/2023   Procedure: INSERTION, STENT, DRUG-ELUTING, LACRIMAL CANALICULUS;  Surgeon: Juli Blunt, MD;  Location: [facility];  Service: Ophthalmology;  Laterality: Right;   INSERTION, STENT, DRUG-ELUTING, LACRIMAL CANALICULUS Left 12/10/2023   Procedure: INSERTION, STENT, DRUG-ELUTING, LACRIMAL CANALICULUS;  Surgeon: Juli Blunt, MD;  Location: [facility];  Service: Ophthalmology;  Laterality: Left;   KNEE ARTHROSCOPY Left    torn ligament   LUMBAR LAMINECTOMY/DECOMPRESSION MICRODISCECTOMY Bilateral 12/27/2019   Procedure: BIATERAL LUMBAR TWO- LUMBAR THREE, LUMBAR THREE- LUMBAR FOUR, LUMBAR FOUR-FIVE LAMINOTOMY/FORAMINOTOMY 3 LEVELS;  Surgeon: Budge Purchase, MD;  Location: [facility];  Service: Neurosurgery;  Laterality: Bilateral;  POSTERIOR   PVC ABLATION N/A 04/18/2021   Procedure: PVC ABLATION;  Surgeon: Cindie Ole DASEN, MD;  Location: [facility];  Service: Cardiovascular;  Laterality: N/A;   REPLACEMENT TOTAL KNEE Right 09/02/2022   SPINE SURGERY N/A    Phreesia 02/24/2020    Family History  Problem Relation Age of Onset   Asthma Mother    Hypertension Father    Colon cancer Neg Hx     Social History   Socioeconomic History   Marital status: Widowed    Spouse  name: Douglas Keller    Number of children: 1   Years of education: Not on file   Highest education level: 12th grade  Occupational History    Comment: Retired   Tobacco Use   Smoking status: Never    Passive exposure: Never   Smokeless tobacco: Never  Vaping Use   Vaping status: Never Used  Substance and Sexual Activity   Alcohol use: No   Drug use: No   Sexual activity: Not Currently  Other Topics Concern   Not on file  Social History Narrative   Live alone right now, wife passed in 2009    Douglas Keller daughter-grandchild 2 live in [place]      Enjoys playing golf      Diet: eats what he likes, sweet, fast food, does not eat a lot of canned or boxed foods.    Does not eat enough fruits and veggies.       Caffeine: 1 cup daily   Water : 3-4 bottles of water  daily, Gatorade every now and then         Wears seat belt   Wears sun protection when golfing   Smoke detectors at home   Does not use phone while driving   Social Drivers of Corporate Investment Banker Strain: Low Risk  (05/18/2024)   Overall Financial Resource Strain (CARDIA)    Difficulty of Paying Living Expenses: Not hard at all  Food Insecurity: No Food Insecurity (05/18/2024)   Hunger Vital Sign    Worried About Running Out of Food in the Last Year: Never true    Ran Out of Food in the Last Year: Never true  Transportation Needs: No Transportation Needs (05/18/2024)   PRAPARE - Administrator, Civil Service (Medical): No    Lack of Transportation (Non-Medical): No  Physical Activity: Sufficiently Active (04/17/2024)   Exercise Vital Sign    Days of Exercise per Week: 7 days    Minutes of Exercise per Session: 30 min  Stress: No Stress Concern Present (04/17/2024)   Douglas Keller [employer] Occupational Health - Occupational Stress Questionnaire    Feeling of Stress: Not at all  Social Connections: Moderately Integrated (05/18/2024)   Social Connection and Isolation Panel    Frequency of Communication  with Friends and Family: More than three times a week    Frequency of Social Gatherings with Friends and Family: More than three times a week    Attends Religious Services: More than 4 times per year    Active Member of [employer] or Organizations: Yes    Attends Banker Meetings: More than 4 times per year    Marital Status: Widowed  Intimate Partner Violence: Not At Risk (04/17/2024)   Humiliation, Afraid, Rape, and Kick questionnaire    Fear of Current or Ex-Partner: No    Emotionally Abused: No    Physically Abused: No    Sexually Abused: No    Outpatient Medications Prior to Visit  Medication Sig Dispense Refill   amiodarone  (PACERONE ) 200 MG tablet Take 200 mg five days a week 90  tablet 3   amLODipine  (NORVASC ) 10 MG tablet TAKE 1 TABLET EVERY DAY 15 tablet 0   aspirin  EC 81 MG tablet Take 81 mg by mouth daily.     benazepril  (LOTENSIN ) 10 MG tablet TAKE 1 TABLET EVERY DAY 90 tablet 0   blood glucose meter kit and supplies Dispense based on patient and insurance preference. Use up to four times daily as directed. (FOR ICD-10 E10.9, E11.9). 1 each 0   Continuous Glucose Sensor (FREESTYLE LIBRE 3 SENSOR) MISC CHANGE SENSOR EVERY 14 DAYS AS DIRECTED - USE TO CHECK GLUCOSE CONTINUOUSLY 6 each 3   Evolocumab  (REPATHA  SURECLICK) 140 MG/ML SOAJ INJECT 140 MG INTO THE SKIN EVERY 14 DAYS 6 mL 3   magnesium  oxide (MAG-OX) 400 MG tablet 1 tablet as needed     meclizine  (ANTIVERT ) 25 MG tablet Take 1 tablet (25 mg total) by mouth 3 (three) times daily as needed for dizziness. 30 tablet 1   multivitamin-iron-minerals-folic acid  (CENTRUM) chewable tablet Chew 1 tablet by mouth daily.     spironolactone -hydrochlorothiazide  (ALDACTAZIDE ) 25-25 MG tablet TAKE 1 TABLET EVERY OTHER DAY 45 tablet 1   No facility-administered medications prior to visit.    Allergies  Allergen Reactions   Metformin  And Related Diarrhea and Nausea And Vomiting   Statins Other (See Comments)    Muscle/joint  pain     Review of Systems  Constitutional:  Negative for chills and fever.  HENT:  Negative for congestion and sore throat.   Eyes:  Negative for pain and discharge.  Respiratory:  Negative for cough and shortness of breath.   Cardiovascular:  Negative for chest pain and palpitations.  Gastrointestinal:  Negative for diarrhea, nausea and vomiting.  Endocrine: Negative for polydipsia and polyuria.  Genitourinary:  Negative for dysuria and hematuria.  Musculoskeletal:  Negative for neck pain and neck stiffness.  Skin:  Negative for rash.  Neurological:  Positive for numbness (RLE). Negative for dizziness and weakness.  Psychiatric/Behavioral:  Negative for agitation and behavioral problems.        Objective:    Physical Exam Vitals reviewed.  Constitutional:      General: He is not in acute distress.    Appearance: He is obese. He is not diaphoretic.  HENT:     Head: Normocephalic and atraumatic.     Nose: Nose normal.     Mouth/Throat:     Mouth: Mucous membranes are moist.  Eyes:     General: No scleral icterus.    Extraocular Movements: Extraocular movements intact.  Cardiovascular:     Rate and Rhythm: Normal rate and regular rhythm.     Heart sounds: Normal heart sounds. No murmur heard. Pulmonary:     Breath sounds: Normal breath sounds. No wheezing or rales.  Musculoskeletal:     Cervical back: Neck supple. No tenderness.     Right lower leg: No edema.     Left lower leg: No edema.  Skin:    General: Skin is warm.     Findings: No rash.  Neurological:     General: No focal deficit present.     Mental Status: He is alert and oriented to person, place, and time.     Sensory: Sensory deficit (RLE) present.  Psychiatric:        Mood and Affect: Mood normal.        Behavior: Behavior normal.     BP 139/81   Pulse 79   Ht 5' 6 (1.676 m)   Wt  205 lb 12.8 oz (93.4 kg)   SpO2 99%   BMI 33.22 kg/m  Wt Readings from Last 3 Encounters:  05/18/24 205 lb 12.8  oz (93.4 kg)  04/17/24 207 lb (93.9 kg)  01/31/24 205 lb 9.6 oz (93.3 kg)        Assessment & Plan:   Problem List Items Addressed This Visit       Cardiovascular and Mediastinum   HTN (hypertension)   BP Readings from Last 1 Encounters:  05/18/24 139/81   Well-controlled with amlodipine  10 mg QD, benazepril  10 mg QD and spironolactone -HCTZ 25-25 mg QD Counseled for compliance with the medications Advised low-salt diet and ambulate as tolerated      Relevant Orders   TSH   CMP14+EGFR   CBC with Differential/Platelet   Chronic heart failure with preserved ejection fraction (HFpEF) (HCC)   Appears euvolemic currently On spironolactone -HCTZ 25-25 mg QD and benazepril  10 mg QD Followed by cardiology        Endocrine   Type 2 diabetes mellitus with hyperlipidemia (HCC)   Lab Results  Component Value Date   HGBA1C 6.5 (H) 01/31/2024   Well-controlled with diet alone On Repatha  for HLD Will discuss about SGLT2i if HbA1c > 6.5 Advised to follow diabetic diet F/u CMP and lipid panel Diabetic foot exam: Today Diabetic eye exam: Advised to follow up with Ophthalmology for diabetic eye exam      Relevant Orders   Hemoglobin A1c   CMP14+EGFR   Urine Microalbumin w/creat. ratio     Nervous and Auditory   Lumbar radiculopathy - Primary   Chronic low back pain, with numbness and tingling of RLE, could be due to lumbar radiculopathy Idiopathic peripheral neuropathy can also be a consideration for his current symptoms, less likely to be vascular in etiology as he does not have any claudication symptoms Started gabapentin  100 mg nightly for now, increase dose as tolerated Advised to take vitamin B12 500 mcg for neuropathic symptoms If persistent or worsening symptoms, will obtain imaging of the lumbar spine      Relevant Medications   gabapentin  (NEURONTIN ) 100 MG capsule     Other   Hyperlipidemia   On Repatha  currently Does not tolerate statin, has myalgias Check  lipid profile      Relevant Orders   Lipid Profile   Vitamin D  deficiency   Continue vitamin D  2000 IU QD      Relevant Orders   VITAMIN D  25 Hydroxy (Vit-D Deficiency, Fractures)     Meds ordered this encounter  Medications   gabapentin  (NEURONTIN ) 100 MG capsule    Sig: Take 1 capsule (100 mg total) by mouth at bedtime.    Dispense:  30 capsule    Refill:  3     Angellynn Kimberlin MARLA Blanch, MD

## 2024-05-18 NOTE — Patient Instructions (Signed)
 Please start taking Gabapentin  100 mg at bedtime.  Please start taking Vitamin B12 - 500 mcg once daily.  Please continue to take medications as prescribed.  Please continue to follow low carb diet and perform moderate exercise/walking as tolerated.  Please get fasting blood tests done before the next visit.

## 2024-05-18 NOTE — Assessment & Plan Note (Signed)
 Lab Results  Component Value Date   HGBA1C 6.5 (H) 01/31/2024   Well-controlled with diet alone On Repatha  for HLD Will discuss about SGLT2i if HbA1c > 6.5 Advised to follow diabetic diet F/u CMP and lipid panel Diabetic foot exam: Today Diabetic eye exam: Advised to follow up with Ophthalmology for diabetic eye exam

## 2024-05-18 NOTE — Assessment & Plan Note (Addendum)
 BP Readings from Last 1 Encounters:  05/18/24 139/81   Well-controlled with amlodipine  10 mg QD, benazepril  10 mg QD and spironolactone -HCTZ 25-25 mg QD Counseled for compliance with the medications Advised low-salt diet and ambulate as tolerated

## 2024-05-18 NOTE — Assessment & Plan Note (Addendum)
 Chronic low back pain, with numbness and tingling of RLE, could be due to lumbar radiculopathy Idiopathic peripheral neuropathy can also be a consideration for his current symptoms, less likely to be vascular in etiology as he does not have any claudication symptoms Started gabapentin  100 mg nightly for now, increase dose as tolerated Advised to take vitamin B12 500 mcg for neuropathic symptoms If persistent or worsening symptoms, will obtain imaging of the lumbar spine

## 2024-05-18 NOTE — Assessment & Plan Note (Signed)
 On Repatha  currently Does not tolerate statin, has myalgias Check lipid profile

## 2024-05-18 NOTE — Assessment & Plan Note (Signed)
 Continue vitamin D  2000 IU QD

## 2024-05-20 LAB — MICROALBUMIN / CREATININE URINE RATIO
Creatinine, Urine: 284 mg/dL
Microalb/Creat Ratio: 4 mg/g{creat} (ref 0–29)
Microalbumin, Urine: 11.5 ug/mL

## 2024-05-30 ENCOUNTER — Ambulatory Visit (HOSPITAL_COMMUNITY)
Admission: RE | Admit: 2024-05-30 | Discharge: 2024-05-30 | Disposition: A | Discharge status: Home / Self Care | Source: Ambulatory Visit | Attending: Cardiovascular Disease | Admitting: Cardiovascular Disease

## 2024-05-30 ENCOUNTER — Ambulatory Visit: Payer: Self-pay | Admitting: Cardiovascular Disease

## 2024-05-30 ENCOUNTER — Other Ambulatory Visit (HOSPITAL_COMMUNITY)

## 2024-05-30 DIAGNOSIS — I5032 Chronic diastolic (congestive) heart failure: Secondary | ICD-10-CM | POA: Insufficient documentation

## 2024-05-30 LAB — ECHOCARDIOGRAM COMPLETE
AR max vel: 1.72 cm2
AV Area VTI: 1.71 cm2
AV Area mean vel: 1.67 cm2
AV Mean grad: 7 mmHg
AV Peak grad: 12.4 mmHg
Ao pk vel: 1.76 m/s
Area-P 1/2: 1.84 cm2
Calc EF: 59.2 %
Est EF: 50
S' Lateral: 3.5 cm
Single Plane A2C EF: 58.2 %
Single Plane A4C EF: 57 %

## 2024-05-30 NOTE — Progress Notes (Signed)
*  PRELIMINARY RESULTS* Echocardiogram 2D Echocardiogram has been performed.  Teresa Aida PARAS 05/30/2024, 10:19 AM

## 2024-06-06 ENCOUNTER — Ambulatory Visit: Admitting: Nurse Practitioner

## 2024-06-21 ENCOUNTER — Other Ambulatory Visit: Payer: Self-pay | Admitting: Cardiovascular Disease

## 2024-06-27 ENCOUNTER — Other Ambulatory Visit: Payer: Self-pay

## 2024-06-27 ENCOUNTER — Other Ambulatory Visit: Payer: Self-pay | Admitting: Internal Medicine

## 2024-06-27 DIAGNOSIS — E1169 Type 2 diabetes mellitus with other specified complication: Secondary | ICD-10-CM

## 2024-06-27 DIAGNOSIS — M5416 Radiculopathy, lumbar region: Secondary | ICD-10-CM

## 2024-06-27 MED ORDER — FREESTYLE LIBRE 3 SENSOR MISC: 3 refills | Status: DC

## 2024-06-27 MED ORDER — GABAPENTIN 100 MG PO CAPS: 100.0000 mg | ORAL_CAPSULE | Freq: Every day | ORAL | 1 refills | Status: AC

## 2024-06-30 ENCOUNTER — Telehealth: Payer: Self-pay

## 2024-06-30 NOTE — Telephone Encounter (Signed)
 Copied from CRM #[id number]. Topic: Clinical - Order For Equipment >> Jun 30, 2024 12:08 PM Jasmin G wrote: Reason for CRM: Pt states that a prescription was sent to [facility] - [place], [place] - [address] for Continuous Glucose Sensor (FREESTYLE LIBRE 3 SENSOR) MISC, but pt states that he needs the plus since he's been having trouble with the regular one since it's outdated.

## 2024-07-04 MED ORDER — FREESTYLE LIBRE 3 PLUS SENSOR MISC: 3 refills | Status: AC

## 2024-07-04 NOTE — Telephone Encounter (Signed)
 Patient advised.

## 2024-07-04 NOTE — Addendum Note (Signed)
 Addended byBETHA TOBIE DOWNS on: 07/04/2024 12:25 PM   Modules accepted: Orders

## 2024-07-25 ENCOUNTER — Telehealth: Payer: Self-pay

## 2024-07-25 NOTE — Telephone Encounter (Signed)
 Copied from CRM [id number]. Topic: Appointments - Scheduling Inquiry for Clinic >> Jul 25, 2024  9:21 AM Frederich PARAS wrote: Reason for CRM: pt called to reschedule  2/2 apt to the week of the 8th of February he prefers a morning apt. Epic would not allow me to make any changes to the apt  . Pt callback# is [phone]

## 2024-07-25 NOTE — Telephone Encounter (Signed)
 Called patient and rescheduled.

## 2024-07-28 ENCOUNTER — Other Ambulatory Visit: Payer: Self-pay | Admitting: Cardiovascular Disease

## 2024-07-28 DIAGNOSIS — I10 Essential (primary) hypertension: Secondary | ICD-10-CM

## 2024-08-16 ENCOUNTER — Encounter: Admitting: Internal Medicine

## 2024-08-21 ENCOUNTER — Encounter: Admitting: Internal Medicine

## 2024-10-02 ENCOUNTER — Ambulatory Visit: Payer: Self-pay | Admitting: Internal Medicine

## 2025-04-18 ENCOUNTER — Ambulatory Visit
